# Patient Record
Sex: Female | Born: 1970 | Race: White | Hispanic: No | Marital: Married | Smoking: Former smoker
Health system: Southern US, Community
[De-identification: ages and names within clinical notes are randomized; demographics above are authoritative.]

## PROBLEM LIST (undated history)

## (undated) DIAGNOSIS — C801 Malignant (primary) neoplasm, unspecified: Secondary | ICD-10-CM

## (undated) DIAGNOSIS — G4730 Sleep apnea, unspecified: Secondary | ICD-10-CM

## (undated) DIAGNOSIS — E785 Hyperlipidemia, unspecified: Secondary | ICD-10-CM

## (undated) DIAGNOSIS — K219 Gastro-esophageal reflux disease without esophagitis: Secondary | ICD-10-CM

## (undated) DIAGNOSIS — R7303 Prediabetes: Secondary | ICD-10-CM

## (undated) DIAGNOSIS — G43909 Migraine, unspecified, not intractable, without status migrainosus: Secondary | ICD-10-CM

## (undated) HISTORY — DX: Gastro-esophageal reflux disease without esophagitis: K21.9

## (undated) HISTORY — PX: BREAST EXCISIONAL BIOPSY: SUR124

## (undated) HISTORY — DX: Hyperlipidemia, unspecified: E78.5

## (undated) HISTORY — PX: BREAST SURGERY: SHX581

## (undated) HISTORY — DX: Migraine, unspecified, not intractable, without status migrainosus: G43.909

---

## 1998-04-13 ENCOUNTER — Other Ambulatory Visit: Admission: RE | Admit: 1998-04-13 | Discharge: 1998-04-13 | Payer: Self-pay | Admitting: Obstetrics and Gynecology

## 1998-08-18 HISTORY — PX: TONSILLECTOMY: SUR1361

## 1999-04-16 ENCOUNTER — Ambulatory Visit (HOSPITAL_BASED_OUTPATIENT_CLINIC_OR_DEPARTMENT_OTHER): Admission: RE | Admit: 1999-04-16 | Discharge: 1999-04-16 | Payer: Self-pay | Admitting: Otolaryngology

## 1999-06-27 ENCOUNTER — Other Ambulatory Visit: Admission: RE | Admit: 1999-06-27 | Discharge: 1999-06-27 | Payer: Self-pay | Admitting: Obstetrics and Gynecology

## 2000-05-06 ENCOUNTER — Other Ambulatory Visit: Admission: RE | Admit: 2000-05-06 | Discharge: 2000-05-06 | Payer: Self-pay | Admitting: Obstetrics and Gynecology

## 2001-04-15 ENCOUNTER — Other Ambulatory Visit: Admission: RE | Admit: 2001-04-15 | Discharge: 2001-04-15 | Payer: Self-pay | Admitting: Obstetrics and Gynecology

## 2002-04-26 ENCOUNTER — Other Ambulatory Visit: Admission: RE | Admit: 2002-04-26 | Discharge: 2002-04-26 | Payer: Self-pay | Admitting: Obstetrics and Gynecology

## 2003-03-23 ENCOUNTER — Ambulatory Visit (HOSPITAL_BASED_OUTPATIENT_CLINIC_OR_DEPARTMENT_OTHER): Admission: RE | Admit: 2003-03-23 | Discharge: 2003-03-23 | Payer: Self-pay | Admitting: Family Medicine

## 2003-03-24 ENCOUNTER — Ambulatory Visit (HOSPITAL_COMMUNITY): Admission: RE | Admit: 2003-03-24 | Discharge: 2003-03-24 | Payer: Self-pay | Admitting: Family Medicine

## 2003-03-24 ENCOUNTER — Encounter: Payer: Self-pay | Admitting: Family Medicine

## 2003-05-23 ENCOUNTER — Other Ambulatory Visit: Admission: RE | Admit: 2003-05-23 | Discharge: 2003-05-23 | Payer: Self-pay | Admitting: Obstetrics and Gynecology

## 2003-07-25 ENCOUNTER — Ambulatory Visit (HOSPITAL_BASED_OUTPATIENT_CLINIC_OR_DEPARTMENT_OTHER): Admission: RE | Admit: 2003-07-25 | Discharge: 2003-07-25 | Payer: Self-pay | Admitting: Internal Medicine

## 2003-09-11 ENCOUNTER — Ambulatory Visit (HOSPITAL_COMMUNITY): Admission: RE | Admit: 2003-09-11 | Discharge: 2003-09-11 | Payer: Self-pay | Admitting: Family Medicine

## 2004-06-19 ENCOUNTER — Other Ambulatory Visit: Admission: RE | Admit: 2004-06-19 | Discharge: 2004-06-19 | Payer: Self-pay | Admitting: Obstetrics and Gynecology

## 2004-08-18 HISTORY — PX: CHOLECYSTECTOMY: SHX55

## 2004-09-02 ENCOUNTER — Ambulatory Visit: Payer: Self-pay | Admitting: Internal Medicine

## 2004-10-11 ENCOUNTER — Ambulatory Visit: Payer: Self-pay | Admitting: Internal Medicine

## 2004-11-19 ENCOUNTER — Ambulatory Visit (HOSPITAL_BASED_OUTPATIENT_CLINIC_OR_DEPARTMENT_OTHER): Admission: RE | Admit: 2004-11-19 | Discharge: 2004-11-19 | Payer: Self-pay | Admitting: Internal Medicine

## 2004-11-24 ENCOUNTER — Ambulatory Visit: Payer: Self-pay | Admitting: Internal Medicine

## 2004-12-05 ENCOUNTER — Ambulatory Visit: Payer: Self-pay | Admitting: Internal Medicine

## 2004-12-19 ENCOUNTER — Ambulatory Visit (HOSPITAL_COMMUNITY): Admission: RE | Admit: 2004-12-19 | Discharge: 2004-12-19 | Payer: Self-pay | Admitting: Family Medicine

## 2004-12-19 IMAGING — CT CT PELVIS W/ CM
1 of 3 series · 13 of 32 positions shown, 19 images · IV contrast (CONTRAST)
Comparison: none

HISTORY: Right lower quadrant pain

[Series 8447: — · axial · 0.62mm/px · z∈[+1251,+1621]mm · 13 of 88 slices shown, 19 images]
[im 7/88  soft-tissue]
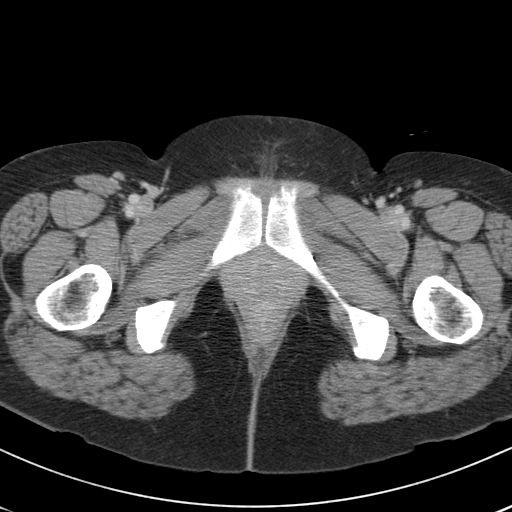
[im 7/88  bone]
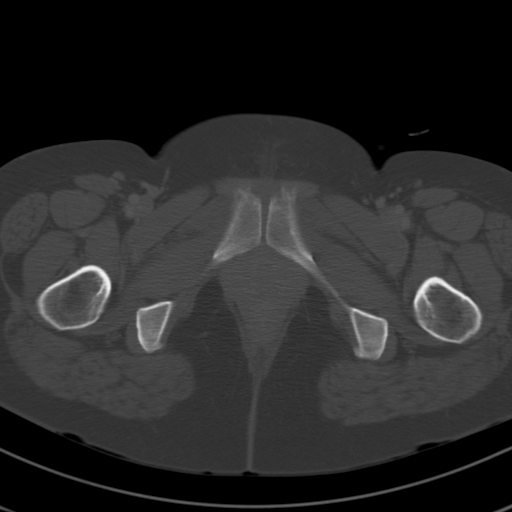
[im 13/88  soft-tissue]
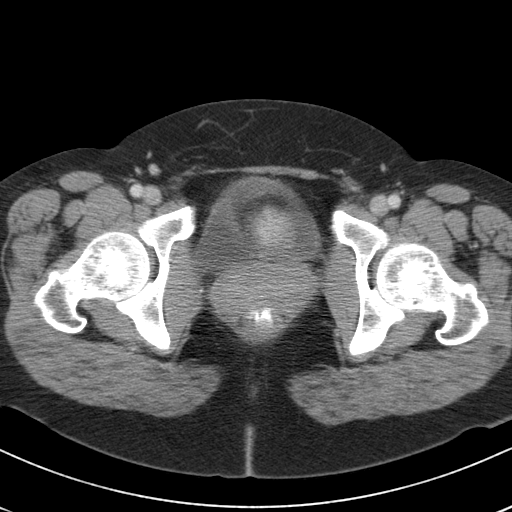
[im 19/88  soft-tissue]
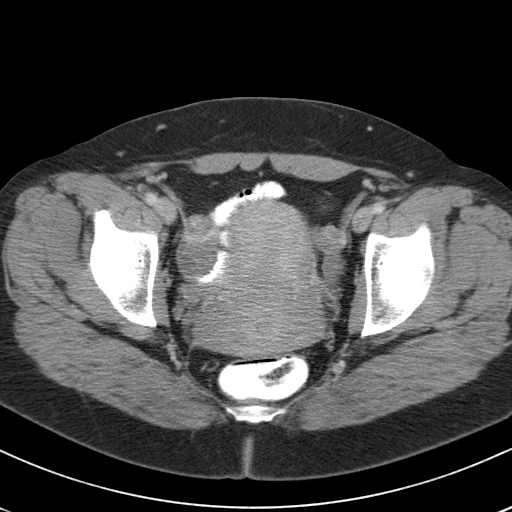
[im 25/88  soft-tissue]
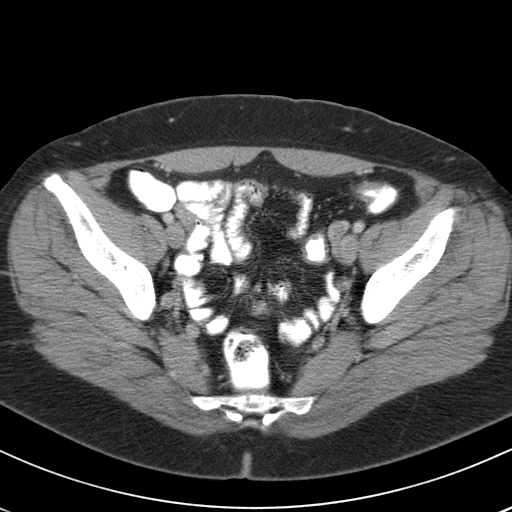
[im 32/88  soft-tissue]
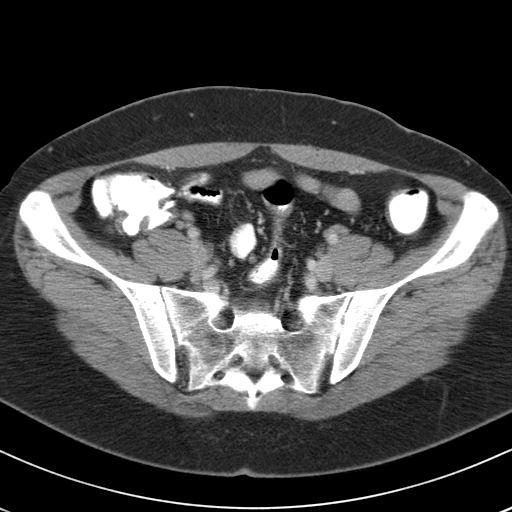
[im 38/88  soft-tissue]
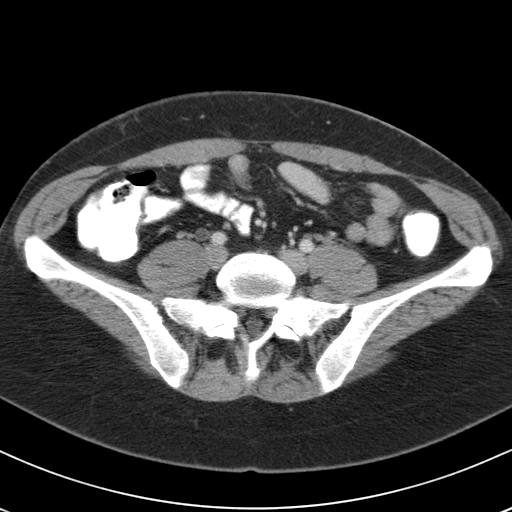
[im 44/88  soft-tissue]
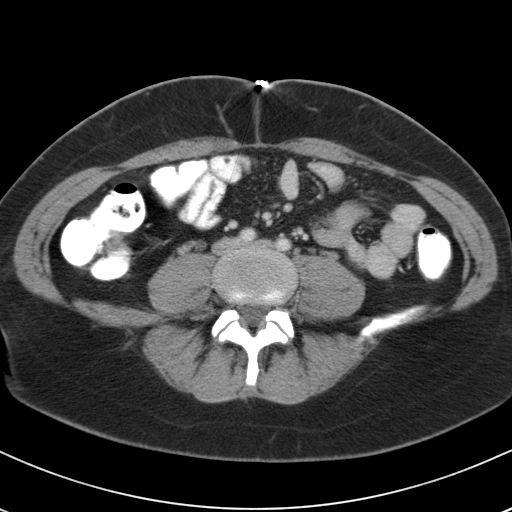
[im 50/88  soft-tissue]
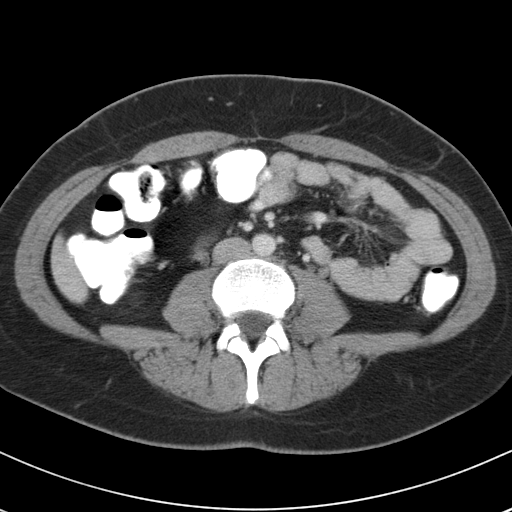
[im 56/88  soft-tissue]
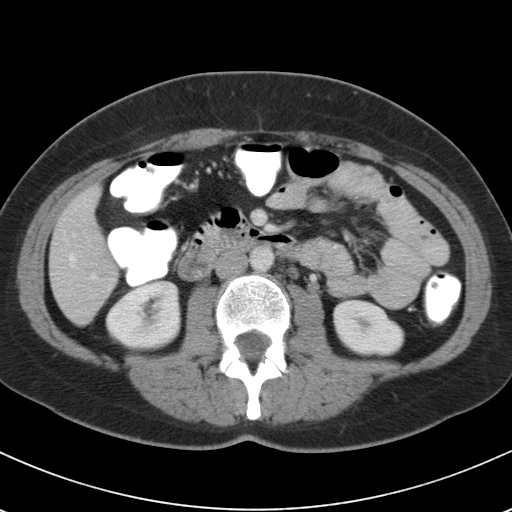
[im 56/88  bone]
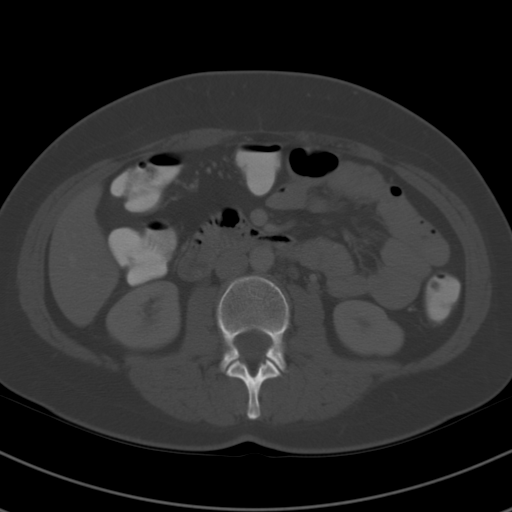
[im 63/88  soft-tissue]
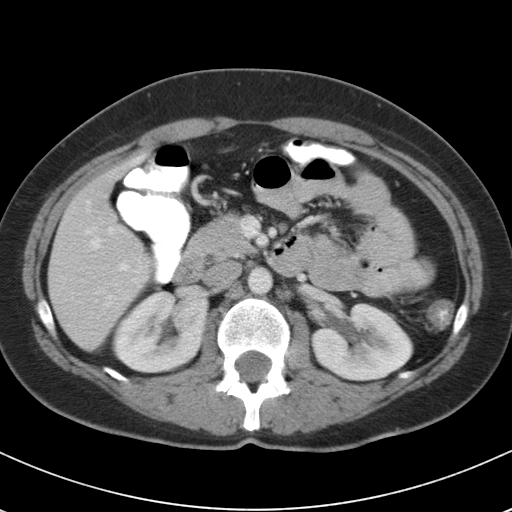
[im 63/88  lung]
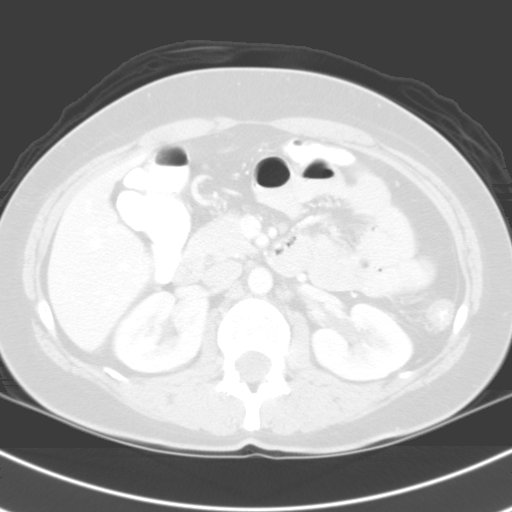
[im 69/88  soft-tissue]
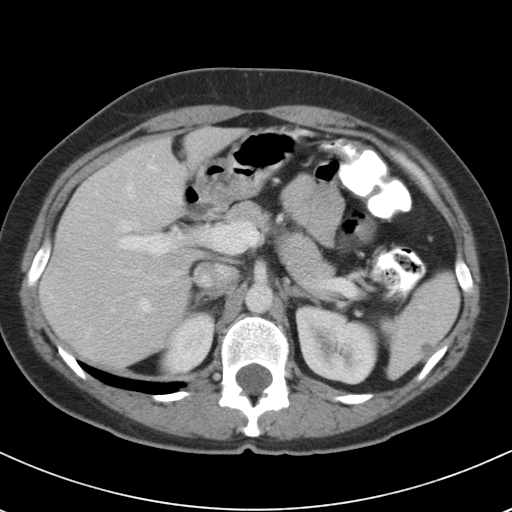
[im 69/88  lung]
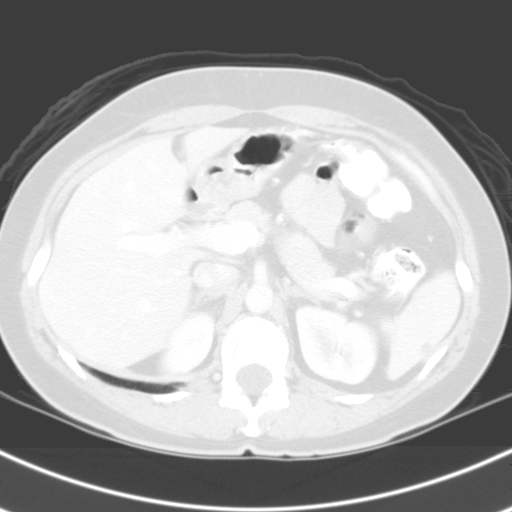
[im 75/88  soft-tissue]
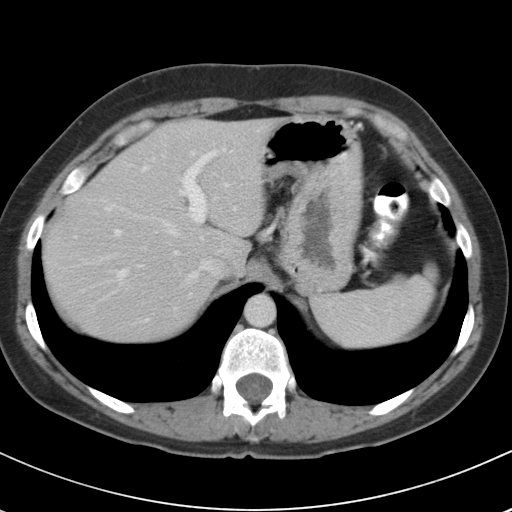
[im 75/88  lung]
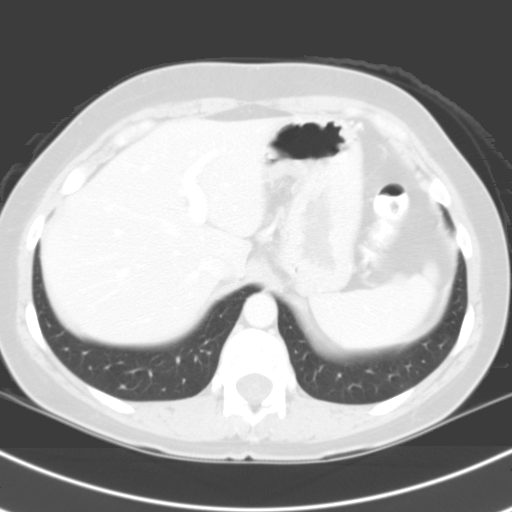
[im 81/88  soft-tissue]
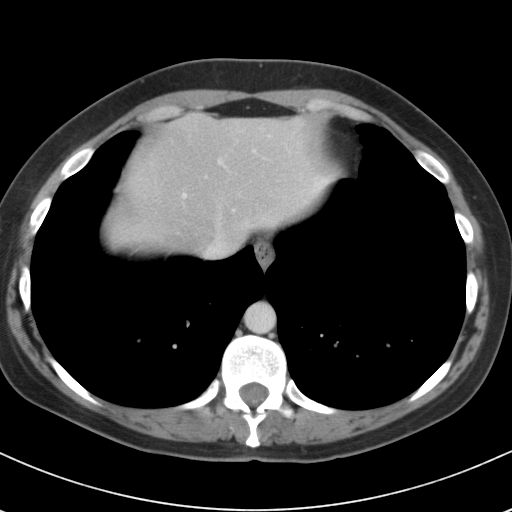
[im 81/88  lung]
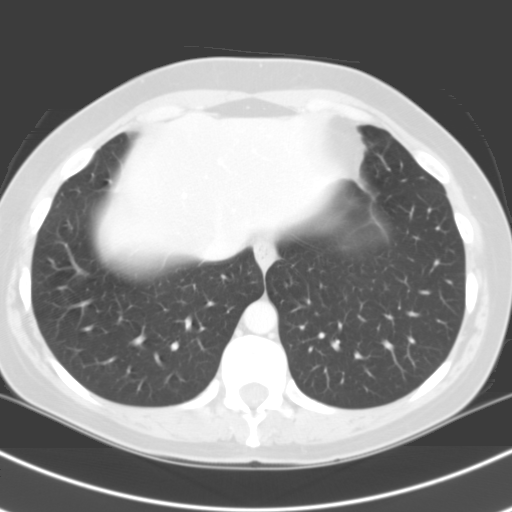

[13 of 32 positions shown; findings below may reference images not displayed]

CT ABDOMEN AND PELVIS WITH CONTRAST:

Multidetector helical CT imaging abdomen and pelvis performed.
Exam utilized dilute oral contrast and 100 cc [8W].
No prior studies comparison.

Lung bases clear.
Status post cholecystectomy.
Liver, pancreas, kidneys, and adrenal glands normal appearance.
Small nonspecific low attenuation focus spleen, 7 x 6 mm diameter, image 20.
Small splenule.
No upper abdominal mass, adenopathy, free fluid, or inflammatory process.
A few scattered normal sized left periaortic lymph nodes noted.
IMPRESSION: 7 x 6 mm diameter nonspecific low attenuation focus spleen, which appears to
slightly fill in with of contrast on delayed images, potentially representing a
tiny splenic hemangioma.
No other upper abdominal abnormalities.

CT PELVIS:

No ureteral calcification or dilatation.
Few scattered bilateral pelvic phleboliths.
Normal appendix.
No pelvic mass, adenopathy, or free fluid.
Small bowel loops unremarkable.
Minimally prominent sigmoid wall, which may be related to underdistention by
contrast.
Normal appearance of uterus and ovaries by CT.
No hernia or acute process identified.
IMPRESSION: Normal appendix.
No definite acute pelvic abnormalities.

## 2005-01-02 ENCOUNTER — Ambulatory Visit: Payer: Self-pay | Admitting: Internal Medicine

## 2005-03-07 ENCOUNTER — Encounter (INDEPENDENT_AMBULATORY_CARE_PROVIDER_SITE_OTHER): Payer: Self-pay | Admitting: *Deleted

## 2005-03-07 ENCOUNTER — Ambulatory Visit (HOSPITAL_COMMUNITY): Admission: RE | Admit: 2005-03-07 | Discharge: 2005-03-07 | Payer: Self-pay | Admitting: Obstetrics and Gynecology

## 2005-04-03 ENCOUNTER — Ambulatory Visit: Payer: Self-pay | Admitting: Internal Medicine

## 2005-07-04 ENCOUNTER — Other Ambulatory Visit: Admission: RE | Admit: 2005-07-04 | Discharge: 2005-07-04 | Payer: Self-pay | Admitting: Obstetrics and Gynecology

## 2005-08-18 HISTORY — PX: ENDOMETRIAL ABLATION: SHX621

## 2005-10-03 ENCOUNTER — Ambulatory Visit: Payer: Self-pay | Admitting: Internal Medicine

## 2006-04-03 ENCOUNTER — Ambulatory Visit: Payer: Self-pay | Admitting: Internal Medicine

## 2006-06-21 ENCOUNTER — Emergency Department (HOSPITAL_COMMUNITY): Admission: EM | Admit: 2006-06-21 | Discharge: 2006-06-22 | Payer: Self-pay | Admitting: *Deleted

## 2006-06-21 IMAGING — CT CT ABDOMEN W/ CM
3 of 5 series · 14 of 32 positions shown, 19 images · IV contrast (ORAL OMNI 350 & 100 ML OMNI 300)
Comparison: [DATE]

CLINICAL DATA: Right-sided abdominal pain.  
 ABDOMEN CT WITH CONTRAST:
TECHNIQUE: Multidetector CT imaging of the abdomen was performed following the standard protocol during bolus administration of intravenous contrast.
 Contrast:  100 cc Omnipaque 300
TECHNIQUE: Multidetector CT imaging of the pelvis was performed following the standard protocol during bolus administration of intravenous contrast.

[Series 2: routine abdomen · axial · 0.70mm/px · z∈[-396,-152]mm · 4 of 83 slices shown, 9 images]
[im 17/83  soft-tissue]
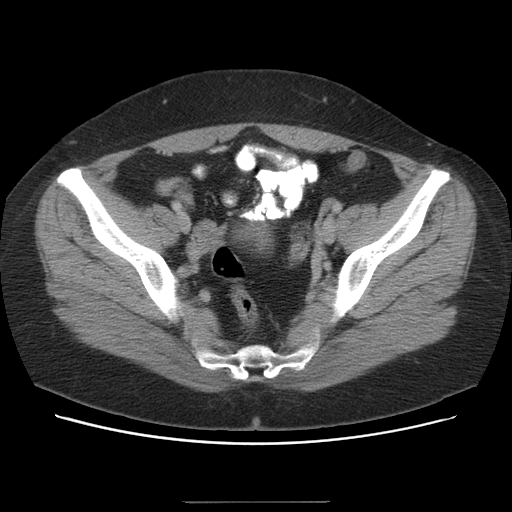
[im 17/83  lung]
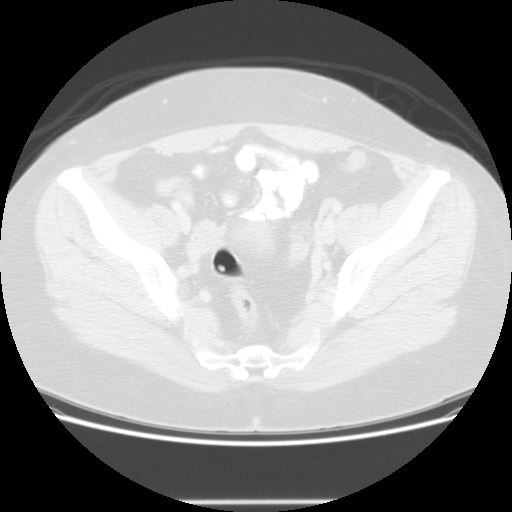
[im 17/83  bone]
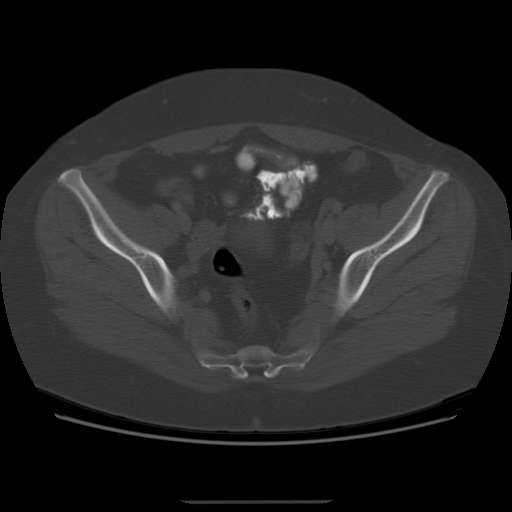
[im 33/83  soft-tissue]
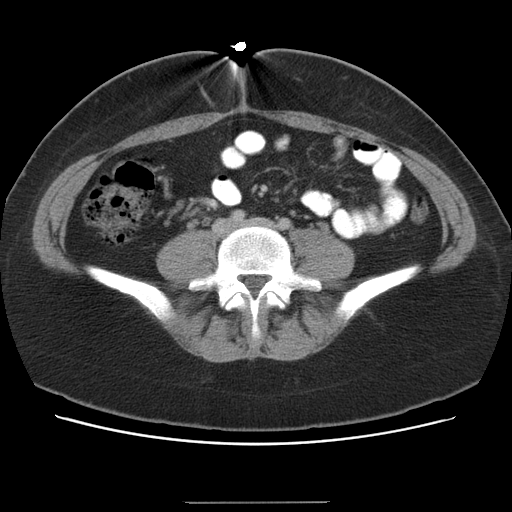
[im 33/83  lung]
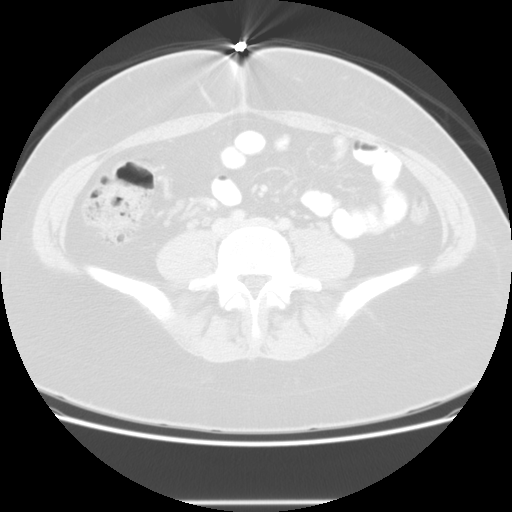
[im 50/83  soft-tissue]
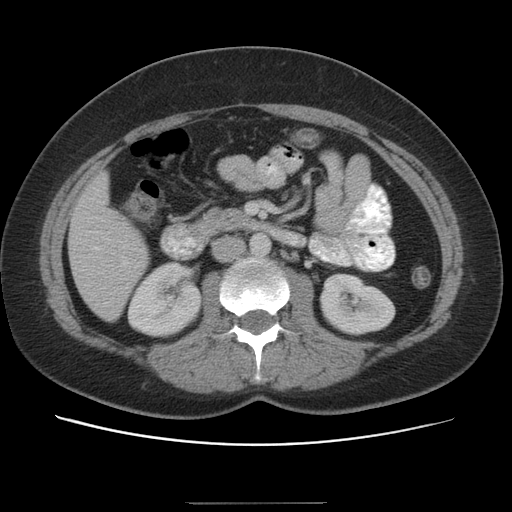
[im 50/83  lung]
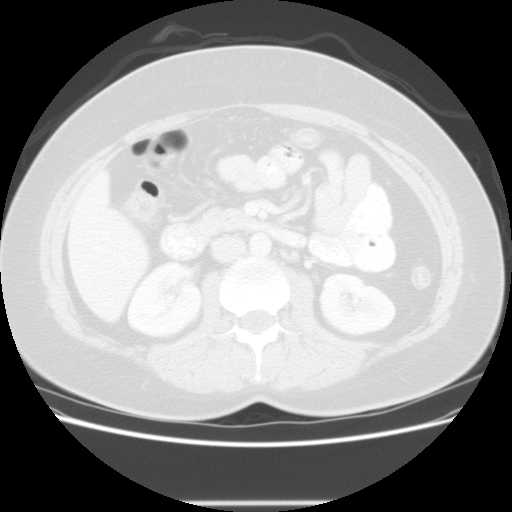
[im 66/83  soft-tissue]
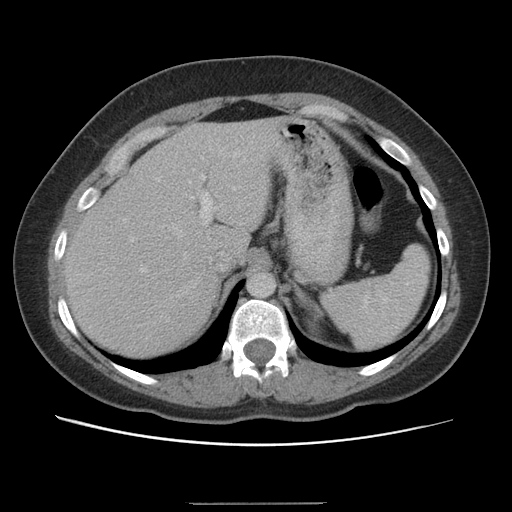
[im 66/83  lung]
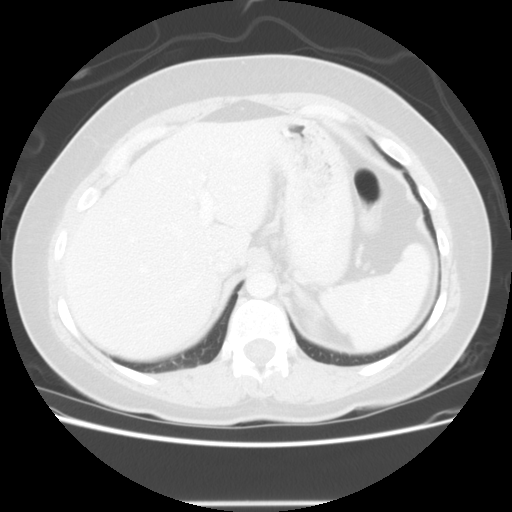

[Series 400: reformatted · sagittal · 0.93mm/px · 8 of 163 slices shown (1 of 2)]
[im 15/163  soft-tissue]
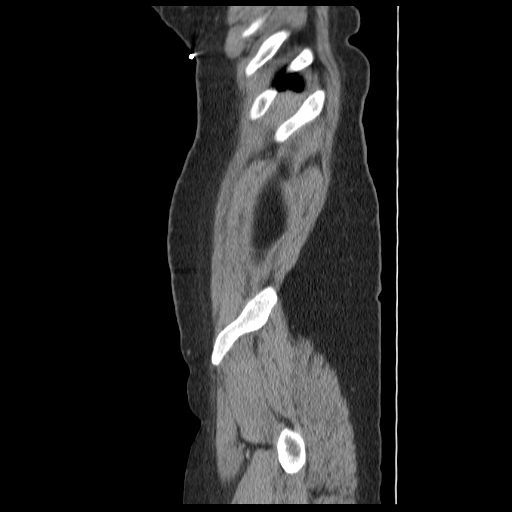
[im 30/163  soft-tissue]
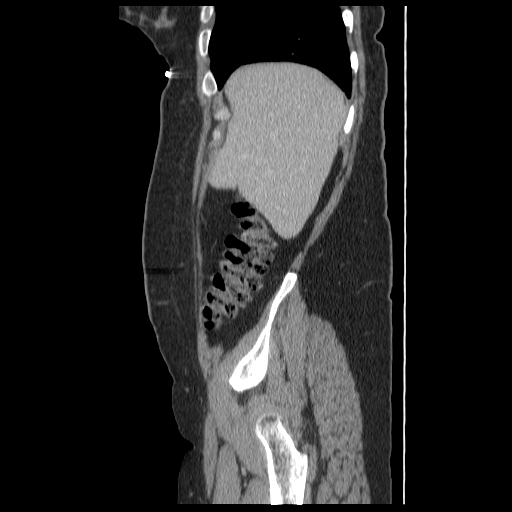
[im 59/163  soft-tissue]
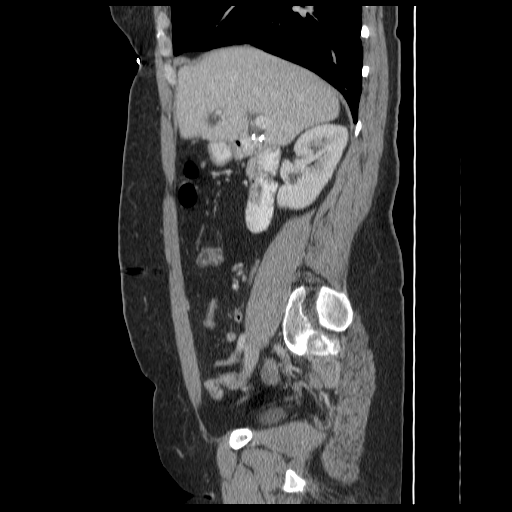
[im 74/163  soft-tissue]
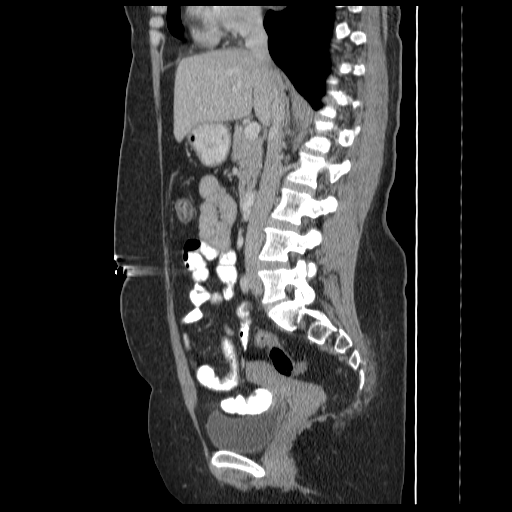
[im 89/163  soft-tissue]
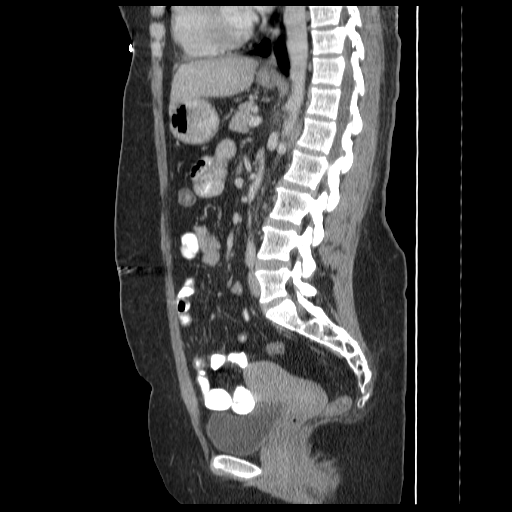
[im 104/163  soft-tissue]
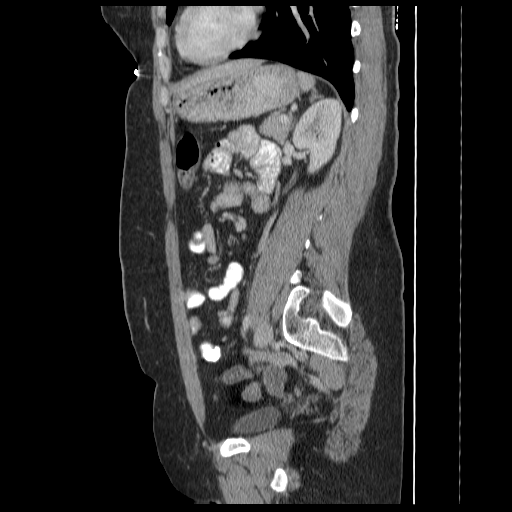
[im 133/163  soft-tissue]
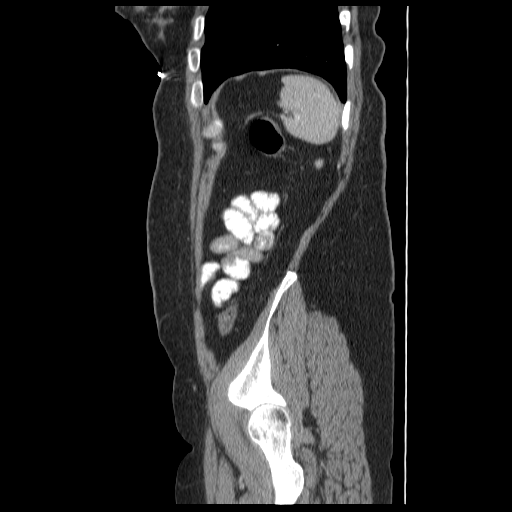
[im 148/163  soft-tissue]
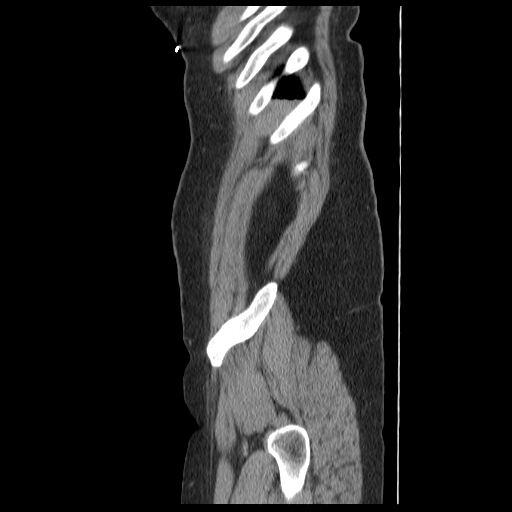

[Series 401: reformatted · coronal · 0.93mm/px · 2 of 143 slices shown (2 of 2)]
[im 16/143  soft-tissue]
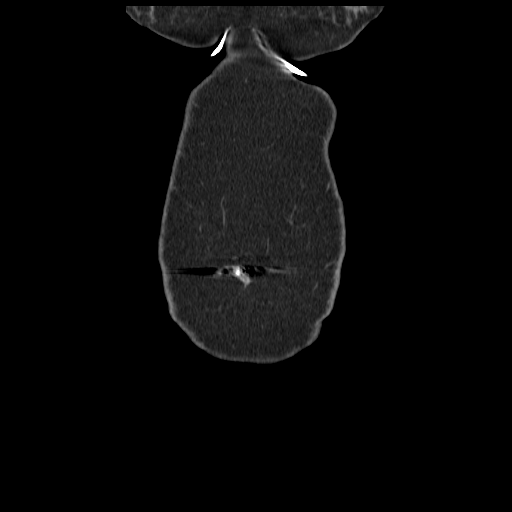
[im 32/143  soft-tissue]
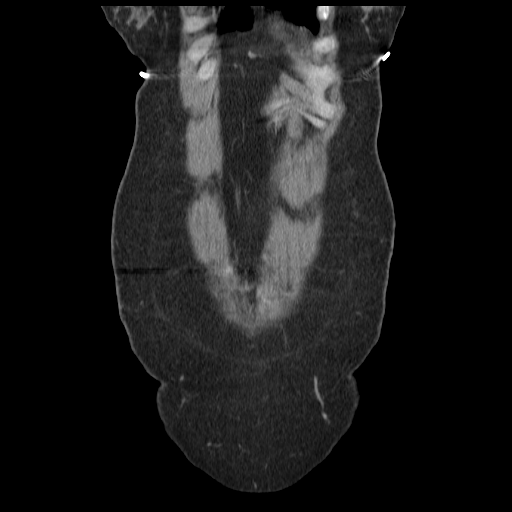

[14 of 32 positions shown; findings below may reference images not displayed]

FINDINGS: Post-cholecystectomy is noted.  Hypodensities in the spleen are stable.  The kidneys, adrenal glands, pancreas and liver are within normal limites.  A 2 mm right lower lobe nodule is present on image #5.  In this age group, this is statistically benign.
IMPRESSION: No acute intraabdominal pathology. 
 PELVIS CT WITH CONTRAST:
FINDINGS: The appendix is within normal limits.  The uterus and adnexa are within normal limits.  Negative free fluid or abnormal adenopathy.  The bladder is within normal limits.
IMPRESSION: No acute intrapelvic pathology.

## 2007-04-08 ENCOUNTER — Ambulatory Visit: Payer: Self-pay | Admitting: Internal Medicine

## 2010-12-31 NOTE — Assessment & Plan Note (Signed)
[facility]                             PULMONARY OFFICE NOTE   JALYNNE, PERSICO                     MRN:          [id number]  DATE:04/08/2007                            DOB:          06/23/1971    PROBLEM LIST:  1. Hypersomnia with narcolepsy.  2. Obstructive sleep apnea.   HISTORY:  For a few weeks she found herself repeatedly waking at night,  so she tried off of her CPAP and has not restarted it yet.  Less  frequent use of Ritalin which she uses only if she does not get 8 hours  of sleep, no naps, day-shift job.  She does use two alarms to get  herself up an hour early to take her Ritalin dose.  She then returns to  sleep while that kicks in and her second alarm wakes her an hour later.  Ritalin remains very well tolerated.  She says thyroid function has been  checked and found normal.  We reviewed her original sleep study which  had shown an AHI of only 10 per hour.  She insists that she does  recognize she feels better if she will use her CPAP.  She has just  really gotten a little passive about it.  I tried not to over state the  impact of CPAP on her health given an AHI of only 10.  I think it has to  be worth it to her from a symptoms standpoint.  Overall, she seems  pretty stable.   MEDICATIONS:  1. Ritalin 5 mg, from 1-3 t.i.d. p.r.n.  2. CPAP was at 10-CWP.   OBJECTIVE:  VITAL SIGNS:  Weight 172 pounds, BP 118/74, pulse 91, room  air saturation 98%.  HEART:  Sounds are regular without murmur.  GENERAL:  She seems very calm.  No tremor.  NECK:  I do not feel thyroid.  LUNGS:  Breathing was unlabored.   IMPRESSION:  1. Previous sleep studies have best fit a pattern of narcolepsy with      an AHI of 6.7 to 10 and a mean sleep latency of 3.2 when that was      tested in 2004.  2. Ongoing tobacco use.   PLAN:  1. She will use CPAP to the extent that she finds it symptomatically      helpful.  2. Continue Ritalin.  3. Make  an effort at smoking cessation.  4. Schedule return 1 year followup, earlier p.r.n.  5. Maintain emphasis on good sleep hygiene.     Clinton D. Maple Hudson, MD, Tonny Bollman, FACP  Electronically Signed    CDY/MedQ  DD: 04/11/2007  DT: 04/11/2007  Job #: [id number]   cc:   Lorin Picket A. Gerda Diss, MD

## 2011-01-03 NOTE — Procedures (Signed)
NAME:  Diana Tate, Diana Tate              ACCOUNT NO.:  [id number]   MEDICAL RECORD NO.:  [id number]          PATIENT TYPE:  OUT   LOCATION:  SLEEP CENTER                 FACILITY:  [facility]   PHYSICIAN:  Clinton D. Maple Hudson, M.D. DATE OF BIRTH:  11-06-1970   DATE OF STUDY:  11/19/2004                              NOCTURNAL POLYSOMNOGRAM   STUDY DATE:  November 19, 2004   REFERRING PHYSICIAN:  Dr. Jetty Duhamel   INDICATION FOR STUDY:  Hypersomnia with sleep apnea.  Epworth Sleepiness  Score 15/24, BMI 26, weight 160 pounds.  Daytime medication includes  methylphenidate.   SLEEP ARCHITECTURE:  Total sleep time 363 minutes with sleep efficiency 98%.  Stage I was 1%, stage II 43%, stages III and IV 24% and REM was 32% of total  sleep time.  Sleep latency 3 minutes, REM latency 98 minutes, awake after  sleep onset 4 minutes, arousal index 10.7.  No sleep medications were taken.   RESPIRATORY DATA:  Respiratory disturbance index (RDI/AHI) 9.2 obstructive  events per hour indicating mild obstructive sleep apnea/hypopnea syndrome.  This included 2 central apneas, 44 obstructive apneas and 16 hypopneas.  Most events were recorded while sleeping supine.  REM RDI 23.  Criteria for  split-study protocol were not met.   OXYGEN DATA:  Moderate snoring with oxygen desaturation to a nadir of 74%  noted stage II while sleeping on the right side.  Mean oxygen saturation  through the study was 94% on room air.   CARDIAC DATA:  Normal sinus rhythm.   MOVEMENT/PARASOMNIA:  A total of 53 limb jerks were recorded of which 9 were  associated with arousal or awakening for periodic limb movement with arousal  index of 1.5 per hour which is probably not significant.   IMPRESSION/RECOMMENDATION:  1.  Normal sleep architecture except for unusual amounts of stages III, IV      and rapid eye movement in this age group.  2.  Mild obstructive sleep apnea/hypopnea syndrome, respiratory disturbance      index 10.2 per hour  without oxygen desaturation to 74% with mild to      moderate snoring.      CDY/MEDQ  D:  11/24/2004 11:08:53  T:  11/24/2004 13:33:02  Job:  [id number]

## 2011-01-03 NOTE — H&P (Signed)
NAME:  Diana Tate, Diana Tate              ACCOUNT NO.:  [id number]   MEDICAL RECORD NO.:  [id number]          PATIENT TYPE:  AMB   LOCATION:                                FACILITY:  [facility]   PHYSICIAN:  Juluis Mire, M.D.        DATE OF BIRTH:   DATE OF ADMISSION:  03/07/2005  DATE OF DISCHARGE:  03/07/2005                                HISTORY & PHYSICAL   A 40 year old gravida 2, para 2 married female presents for hysteroscopy  with NovaSure ablation.   In relation to present admission patient has cycles lasting six to seven  days.  She has three to four days of heavy flow changing tampons every hour  with clots and increasing back discomfort.  Saline infusion ultrasound was  unremarkable.  Evidence consistent of adenomyosis.  Options were discussed.  Patient now presents for hysteroscopy with NovaSure ablation.   ALLERGIES:  No known drug allergies.   MEDICATIONS:  Methylphenidate.   PAST MEDICAL HISTORY:  Usual childhood diseases without any significant  sequelae.   PAST OBSTETRICAL HISTORY:  Two vaginal deliveries.  She has also had a  previous cholecystectomy.   FAMILY HISTORY:  Noncontributory.   SOCIAL HISTORY:  No tobacco or alcohol use.   REVIEW OF SYSTEMS:  Noncontributory.   PHYSICAL EXAMINATION:  GENERAL:  Patient is afebrile with stable vital  signs.  HEENT:  Patient normocephalic.  Pupils are equal, round, and reactive to  light and accommodation.  Extraocular movements were intact.  Sclerae and  conjunctivae clear.  Oropharynx clear.  NECK:  Without thyromegaly.  BREASTS:  No discrete masses.  LUNGS:  Clear.  CARDIOVASCULAR:  Regular rhythm and rate with no murmurs or gallops.  ABDOMEN:  Benign.  No mass, organomegaly, or tenderness.  PELVIC:  Normal external genitalia.  Vaginal mucosa clear.  Cervix  unremarkable.  Uterus upper limits of normal size, mildly boggy.  Adnexal  unremarkable.  EXTREMITIES:  Trace edema.  NEUROLOGIC:  Grossly within normal  limits.   IMPRESSION:  Menorrhagia secondary to adenomyosis.   PLAN:  The patient will undergo hysteroscopy with NovaSure ablation.  Success rates of 80% are quoted.  Risks of surgery are explained including  the risk of infection and the risk of hemorrhage that could require  transfusion or possible hysterectomy, risk of injury to adjacent organs such  as bowel requiring further exploratory surgery, risk of  deep venous thrombosis and pulmonary embolus.  Alternatives have been  discussed including birth control pills, Mirena IUD, or hysterectomy.  The  patient is in favor of the ablative technique.  Of note, patient has had a  prior vasectomy.  We have discussed that ablation does preclude future  pregnancies.       JSM/MEDQ  D:  03/07/2005  T:  03/07/2005  Job:  [id number]

## 2011-01-03 NOTE — Op Note (Signed)
Diana Tate, Diana Tate              ACCOUNT NO.:  [id number]   MEDICAL RECORD NO.:  [id number]          PATIENT TYPE:  AMB   LOCATION:  [facility]                           FACILITY:  [facility]   PHYSICIAN:  Juluis Mire, M.D.   DATE OF BIRTH:  Nov 14, 1970   DATE OF PROCEDURE:  03/07/2005  DATE OF DISCHARGE:                                 OPERATIVE REPORT   PREOPERATIVE DIAGNOSIS:  Menorrhagia.   POSTOPERATIVE DIAGNOSIS:  Menorrhagia.   OPERATIVE PROCEDURES:  1.  Hysteroscopy.  2.  Endometrial curettings.  3.  NovaSure ablation.   SURGEON:  Juluis Mire, M.D.   ANESTHESIA:  General.   ESTIMATED BLOOD LOSS:  Minimal.   PACKS AND DRAINS:  None.   INDICATIONS:  Dictated in the history and physical.   PROCEDURE:  The patient was taken to the OR and placed in the supine  position.  After a satisfactory level of general anesthesia obtained, the  patient was placed in the dorsal lithotomy position in the [place] stirrups.  The perineum and vagina were prepped out with Betadine and draped in a  sterile field.  A speculum was placed in the vaginal vault.  The cervix was  grasped with a single-tooth tenaculum, the uterus sounded to 9 cm.  Endocervical length was 5 cm.  The cervix was serially dilated to a size 25  Pratt dilator.  The nonoperative hysteroscope was introduced and the  intrauterine cavity was distended using lactated Ringer's.  Visualization  revealed a normal endometrial cavity with no polyps or excrescences.  Endometrial curettings were obtained.  At this point in time, the NovaSure  was brought into place, inserted and expanded appropriately.  Cavity length  was 4 cm.  We manipulated the NovaSure.  The total cavity width was 3.5 cm.  We passed the CO2 test.  At this point in time, the ablation was undertaken  at a power of 77 for 1 minute 37 seconds.  The NovaSure was then removed  intact and the tenaculum and speculum were then removed.  The patient was  taken out of the  dorsal lithotomy position, once alert and extubated  transferred to the recovery room in good condition.  Sponge and needle count  were reported as correct by the circulating nurse.     JSM/MEDQ  D:  03/07/2005  T:  03/07/2005  Job:  [id number]

## 2011-01-03 NOTE — Assessment & Plan Note (Signed)
[facility]                               PULMONARY OFFICE NOTE   Diana Tate, Diana Tate                     MRN:          [id number]  DATE:04/03/2006                            DOB:          04/19/1971    PROBLEM LIST:  1. Hypersomnia with narcolepsy.  2. Obstructive sleep apnea.   HISTORY:  She returns for six month followup having been overall very  stable.  She stops wearing CPAP any time she gets a cold or head congestion  and then says it takes her time to readjust again so she only ends up  wearing it for a month or so at a time and admits she would do better if she  could tolerate a full face mask.  We know she has an obstructive apnea  component but it was quite mild and I was never really sure that it was  medically significant.  Her dominant problem has been the severe hypersomnia  with a mean sleep latency of 3.2 minutes when tested in December of 2004.  She has had some clues that this might be narcolepsy rather than etiopathic  hypersomnia but there was also a component of inadequate sleep hygiene.  We  continue to address those issues.  She tried without Ritalin for a couple of  weeks and says it was hard.  She had been concerned that Ritalin was  causing excessive sweating.  A dermatologist gave her Robinul for that  problem and she has decided there was no relationship to the Ritalin.  She  asks about maintenance Chantix saying that she is fighting hard not to  restart smoking.  We had turned CPAP down from 11 to 9 in February and she  asks that it go back up to 10, which I discussed again.   MEDICATIONS:  Ritalin 5 mg 1-3 t.i.d. p.r.n. supplementing Ritalin 20 mg  sustained release 1 daily. The issues of tolerance, dependence and side  effects have been discussed once again for this controlled drug.   OBJECTIVE:  VITAL SIGNS:  Weight 177 pounds.  Blood pressure 112/60.  Pulse  regular, 83, room air-saturation 97%.  GENERAL:   She looks alert, tanned and comfortable with no tremor.  Pulse is  regular.  Speech and thought process are appropriate.  There are no pressure  marks on her face from the mask and no evident nasal congestion.   IMPRESSION:  1. Mild to minimal obstructive sleep apnea 6.4 per hour.  We continue CPAP      because she wants to.  2. Excessive daytime somnolence, etiopathic versus narcolepsy.  3. History of tobacco dependence.   PLAN:  1. Sleep hygiene and smoking cessation were again discussed.  2. Replacement CPAP mask as needed for comfort.  3. Increase CPAP pressure back from 9 to 10 CWP ([facility]  4. Ritalin was refilled.  5. Refill Chantix, maintenance use as discussed.  6. Schedule return in one year, earlier as needed.  Clinton D. Maple Hudson, MD, FCCP, FACP   CDY/MedQ  DD:  04/04/2006  DT:  04/04/2006  Job #:  [id number]   cc:   Lorin Picket A. Gerda Diss, MD

## 2012-02-17 ENCOUNTER — Other Ambulatory Visit: Payer: Self-pay | Admitting: Obstetrics and Gynecology

## 2012-02-17 DIAGNOSIS — R928 Other abnormal and inconclusive findings on diagnostic imaging of breast: Secondary | ICD-10-CM

## 2012-02-25 ENCOUNTER — Other Ambulatory Visit: Payer: Self-pay | Admitting: Obstetrics and Gynecology

## 2012-02-25 ENCOUNTER — Ambulatory Visit
Admission: RE | Admit: 2012-02-25 | Discharge: 2012-02-25 | Disposition: A | Discharge status: Home / Self Care | Payer: BC Managed Care – PPO | Source: Ambulatory Visit | Attending: Obstetrics and Gynecology | Admitting: Obstetrics and Gynecology

## 2012-02-25 DIAGNOSIS — R928 Other abnormal and inconclusive findings on diagnostic imaging of breast: Secondary | ICD-10-CM

## 2012-02-25 IMAGING — MG MM DIGITAL DIAGNOSTIC UNILAT*L*
3 series · 3 of 3 positions shown · non-contrast
Comparison: [DATE] and [DATE].

CLINICAL DATA: Recall from screening mammography.

DIGITAL DIAGNOSTIC LEFT BREAST MAMMOGRAM

[L CC (1 of 2)]
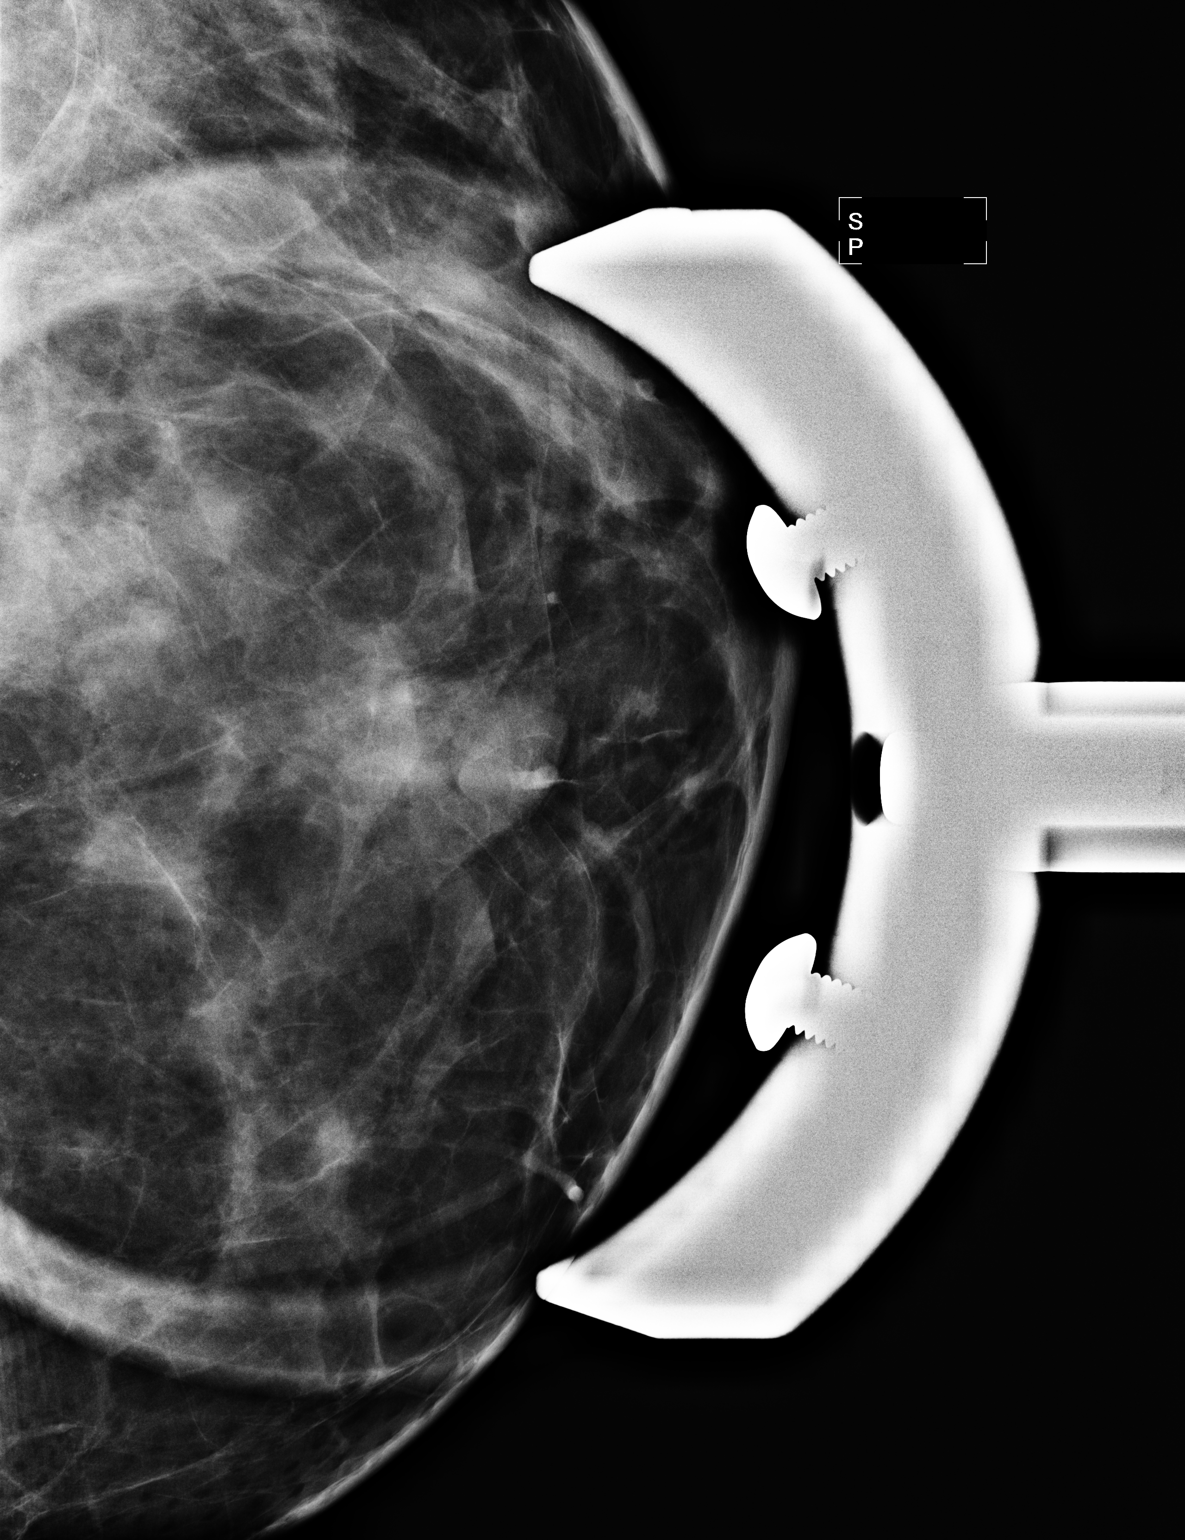

[L ML]
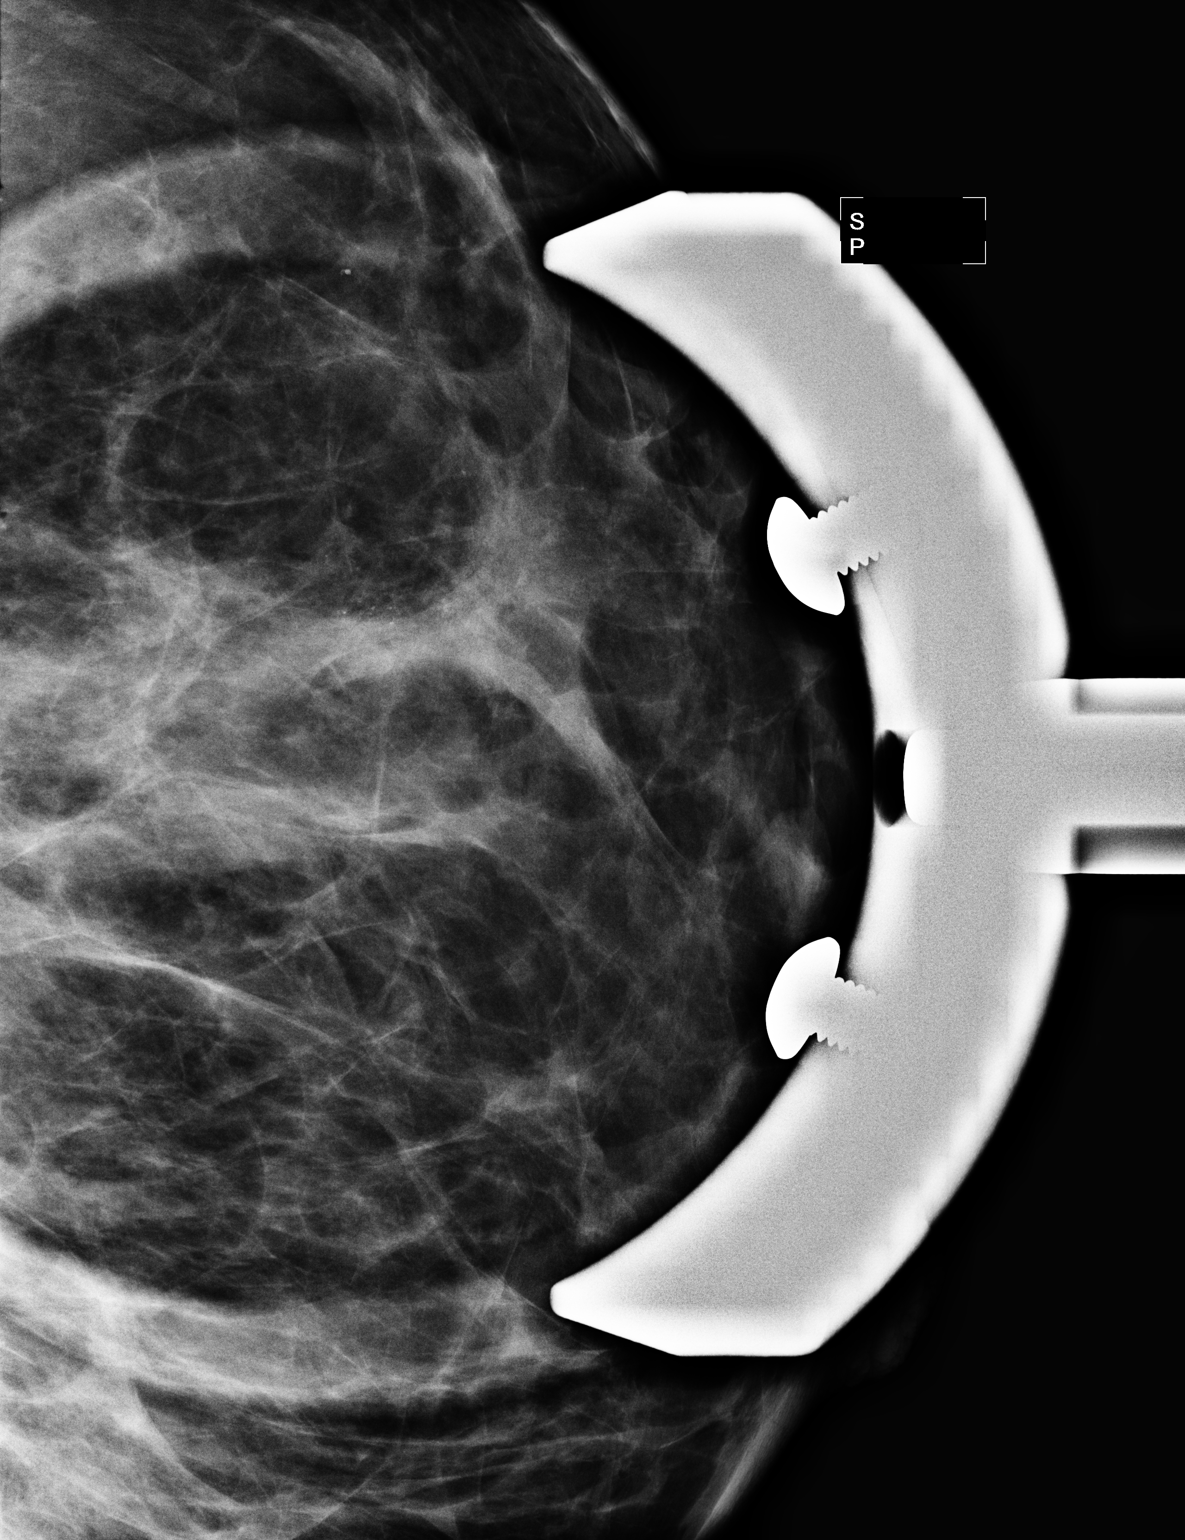

[L CC (2 of 2)]
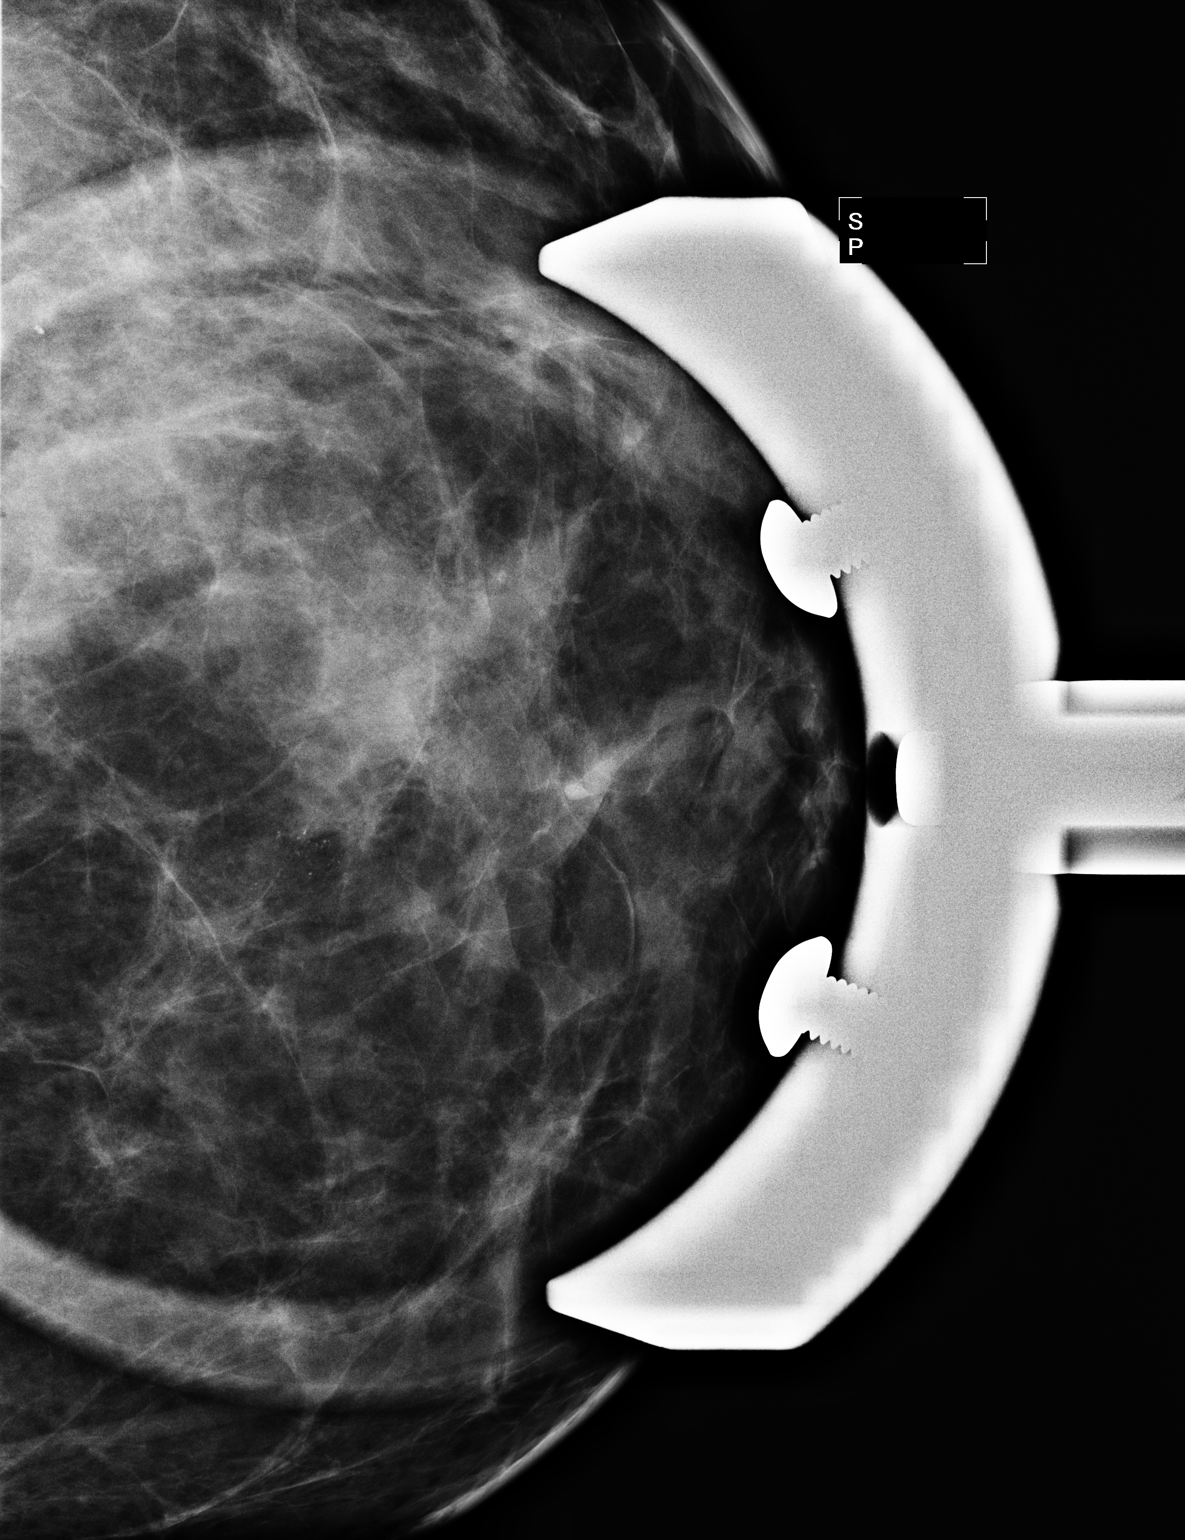

[3 of 3 positions shown; findings below may reference images not displayed]

FINDINGS: Magnification views demonstrate a small (12 mm) cluster
of pleomorphic microcalcifications located within the left breast
at approximately the 12 o'clock position.  These are slightly
suspicious for DCIS and tissue sampling is recommended.  I have
discussed stereotactic core biopsy of the calcifications with the
patient and this will be scheduled per patient preference.
IMPRESSION: Small cluster of pleomorphic microcalcifications located in the
left breast at the 12 o'clock position.  Tissue sampling is
recommended and stereotactic core biopsy will be scheduled.

RECOMMENDATION:
Stereotactic core biopsy.

BI-RADS CATEGORY 4:  Suspicious abnormality - biopsy should be
considered.

## 2012-03-12 ENCOUNTER — Ambulatory Visit
Admission: RE | Admit: 2012-03-12 | Discharge: 2012-03-12 | Disposition: A | Discharge status: Home / Self Care | Payer: BC Managed Care – PPO | Source: Ambulatory Visit | Attending: Obstetrics and Gynecology | Admitting: Obstetrics and Gynecology

## 2012-03-12 ENCOUNTER — Other Ambulatory Visit: Payer: Self-pay | Admitting: Obstetrics and Gynecology

## 2012-03-12 DIAGNOSIS — R928 Other abnormal and inconclusive findings on diagnostic imaging of breast: Secondary | ICD-10-CM

## 2012-03-12 IMAGING — MG MM BREAST STEREO BIOPSY*L*
2 series · 2 of 2 positions shown · non-contrast
Comparison: Previous exams

***ADDENDUM*** CREATED: [DATE] [DATE]

Atypical ductal hyperplasia was reported histologically.  This
corresponds well with the imaging findings.  The patient was
contacted by telephone, on [DATE].  She states the wound site
is clean and dry with no hematoma.  Surgical consultation has been
scheduled with Dr. FORTUNATE on [DATE].
***END ADDENDUM*** SIGNED BY: FORTUNATE, M.D.
CLINICAL DATA: Left breast calcifications
STEREOTACTIC-GUIDED VACUUM ASSISTED BIOPSY OF THE LEFT BREAST AND
SPECIMEN RADIOGRAPH

[L CC]
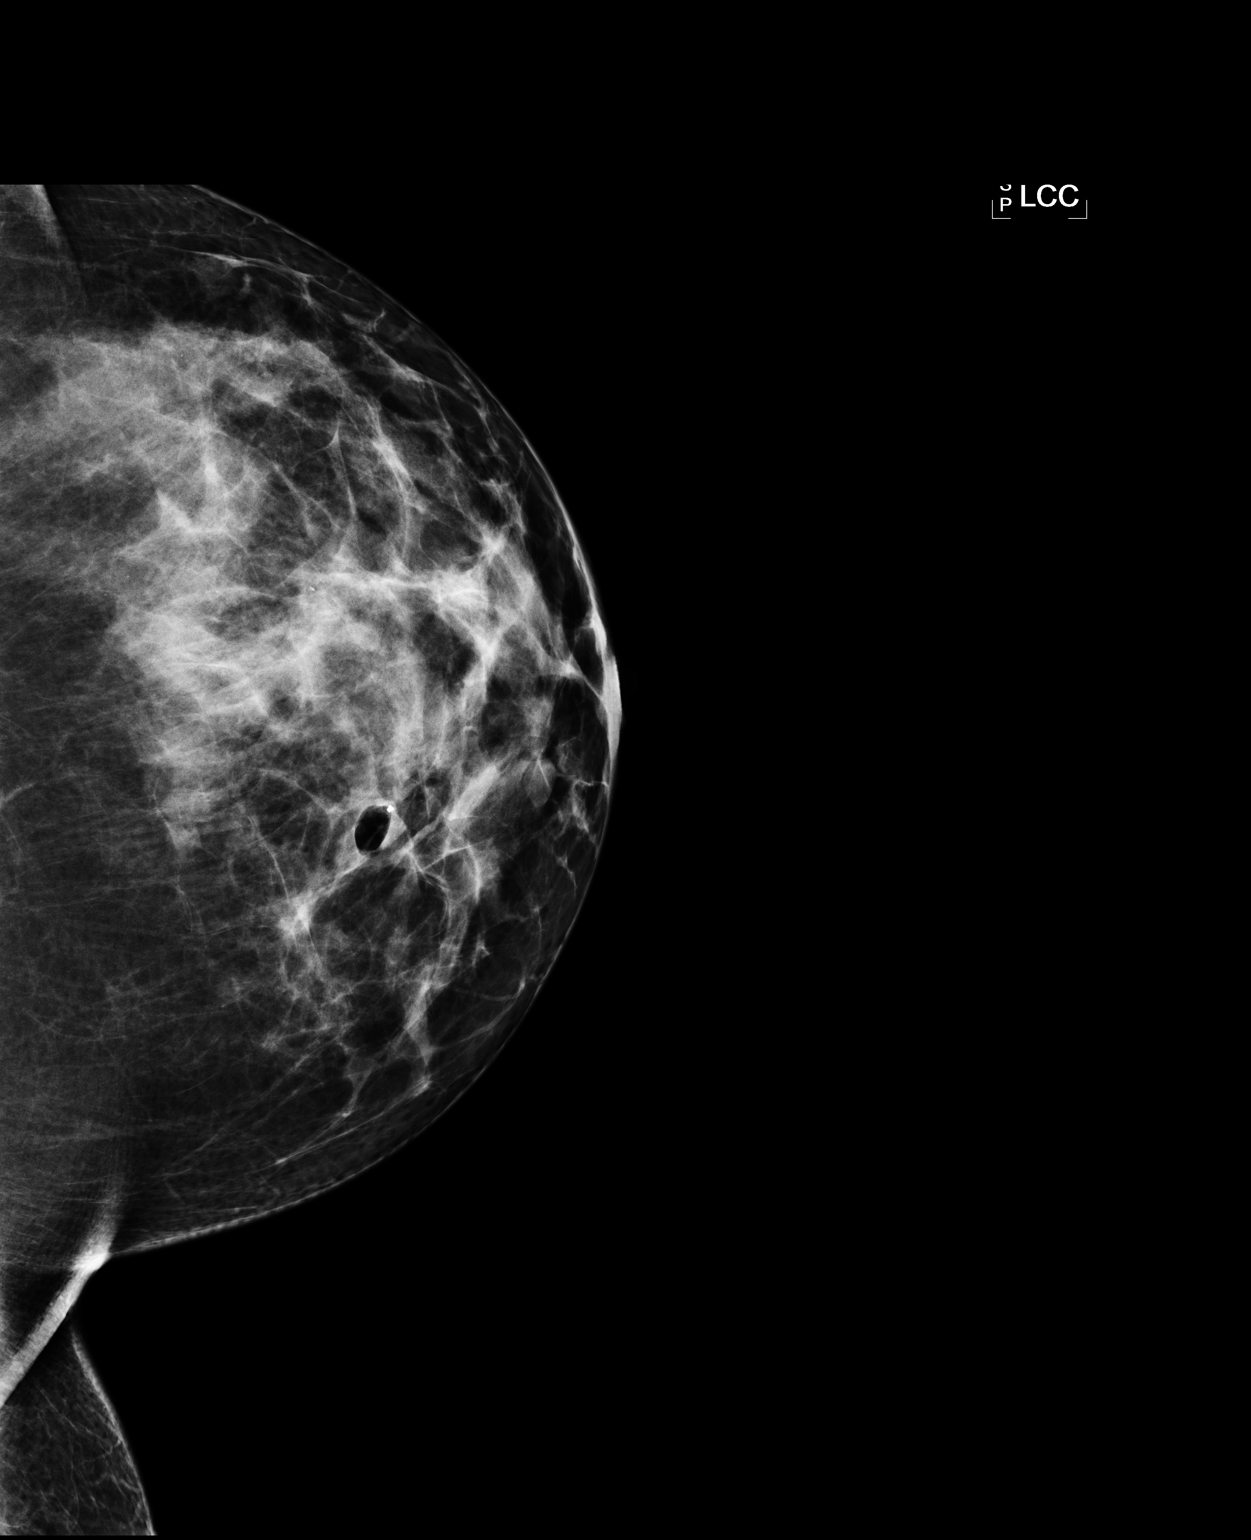

[L ML]
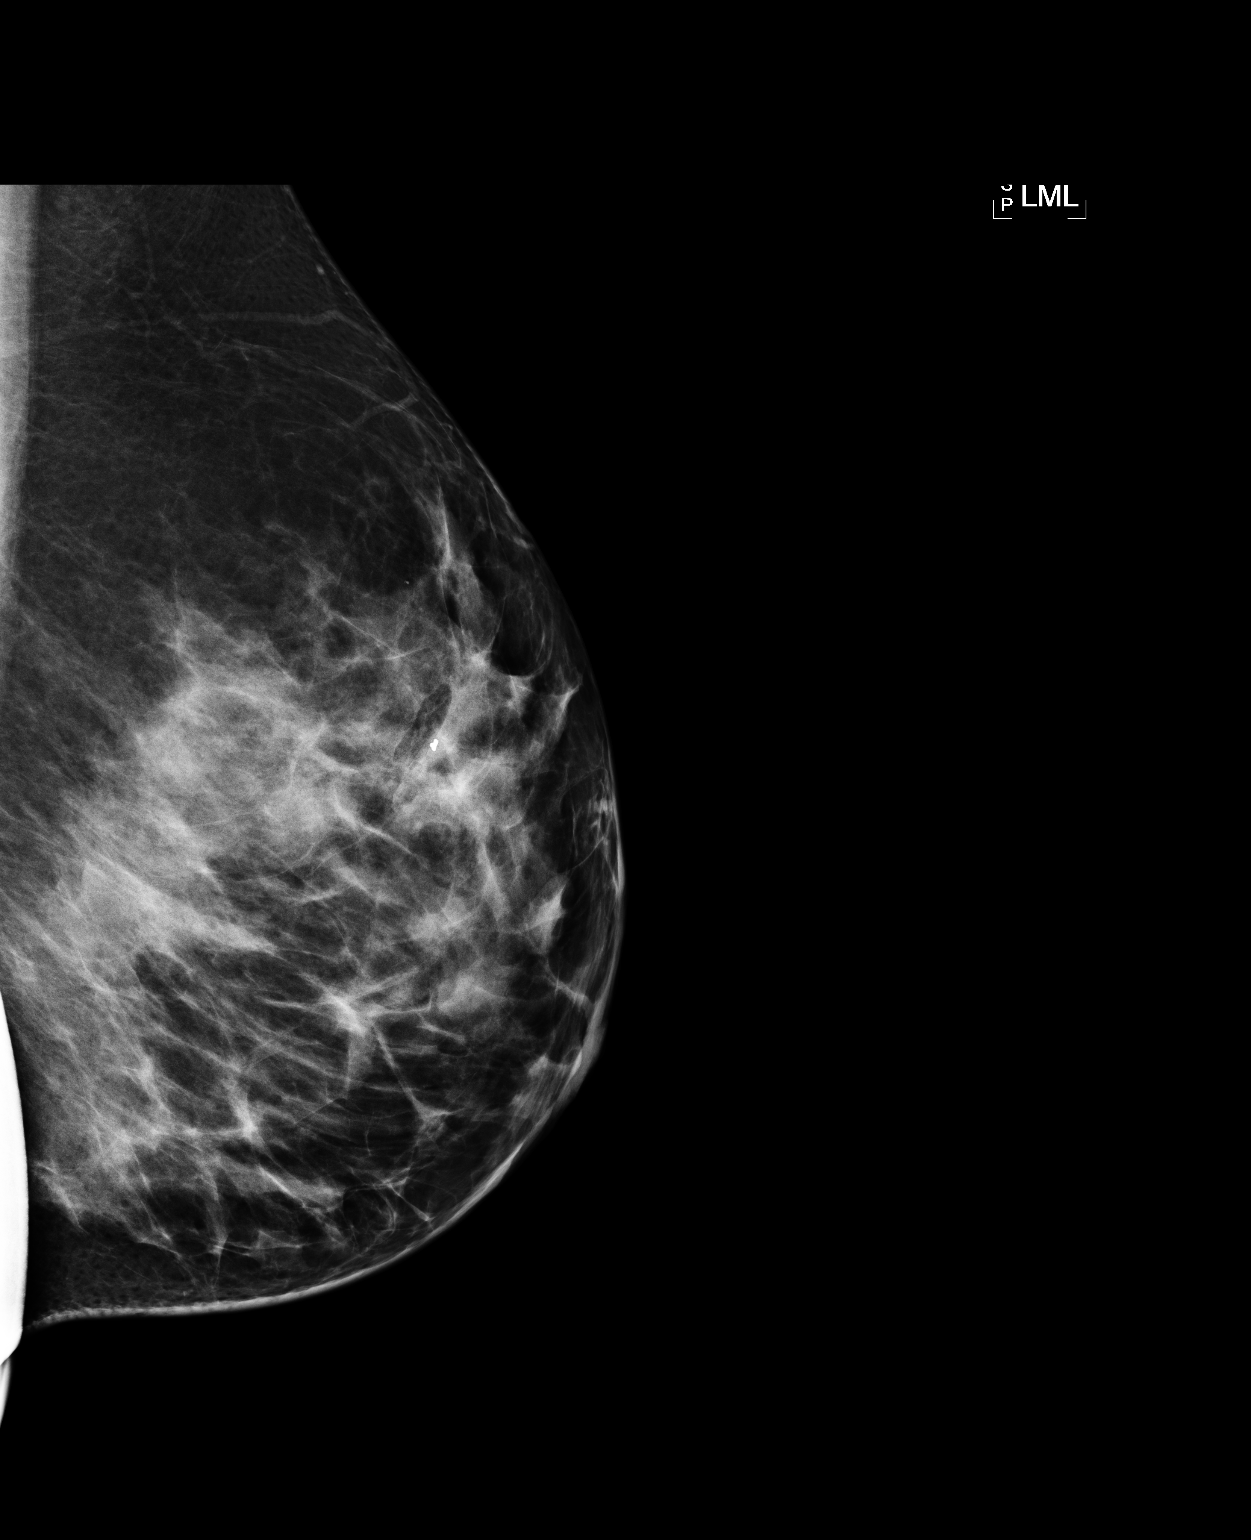

[2 of 2 positions shown; findings below may reference images not displayed]

I met with the patient and we discussed the procedure of
stereotactic-guided biopsy, including benefits and alternatives.
We discussed the high likelihood of a successful procedure. We
discussed the risks of the procedure, including infection,
bleeding, tissue injury, clip migration, and inadequate sampling.
Informed, written consent was given.

Using sterile technique, 2% lidocaine, stereotactic guidance, and a
9 gauge vacuum assisted device, biopsy was performed of the
calcifications in the 11 o'clock position of the left breast.
Specimen radiograph was performed, showing numerous calcifications.
Specimens with calcifications are identified for pathology.

At the conclusion of the procedure, a tissue marker clip was
deployed into the biopsy cavity.  Follow-up 2-view mammogram
confirmed clip to be placed in the correct position at the biopsy
site.
IMPRESSION: Stereotactic-guided biopsy of left breast calcifications.  No
apparent complications.

## 2012-04-07 ENCOUNTER — Other Ambulatory Visit (INDEPENDENT_AMBULATORY_CARE_PROVIDER_SITE_OTHER): Payer: Self-pay | Admitting: Surgery

## 2012-04-07 ENCOUNTER — Encounter (HOSPITAL_COMMUNITY): Payer: Self-pay | Admitting: Pharmacy Technician

## 2012-04-07 ENCOUNTER — Ambulatory Visit (INDEPENDENT_AMBULATORY_CARE_PROVIDER_SITE_OTHER): Payer: BC Managed Care – PPO | Admitting: Surgery

## 2012-04-07 ENCOUNTER — Encounter (INDEPENDENT_AMBULATORY_CARE_PROVIDER_SITE_OTHER): Payer: Self-pay | Admitting: Surgery

## 2012-04-07 VITALS — BP 124/78 | HR 100 | Temp 97.2°F | Resp 20 | Ht 65.5 in | Wt 180.2 lb

## 2012-04-07 DIAGNOSIS — N6099 Unspecified benign mammary dysplasia of unspecified breast: Secondary | ICD-10-CM | POA: Insufficient documentation

## 2012-04-07 DIAGNOSIS — N6089 Other benign mammary dysplasias of unspecified breast: Secondary | ICD-10-CM

## 2012-04-07 NOTE — Progress Notes (Signed)
Patient ID: Diana Tate, female   DOB: 04/02/1971, 41 y.o.   MRN: [id number]  Chief Complaint  Patient presents with  . Pre-op Exam    eval atypical ductal hyperplasia    HPI Diana Tate is a 41 y.o. female.  This patient is sent at the request of Dr.Rubner for abnormal left breast mammogram. She underwent left breast core biopsy which showed atypical ductal hyperplasia and was sent for consideration of excisional lumpectomy. She denies any history of breast mass, breast pain or nipple discharge bilaterally. No history of breast cancer or previous breast biopsy. HPI  Past Medical History  Diagnosis Date  . GERD (gastroesophageal reflux disease)   . Hyperlipidemia     Past Surgical History  Procedure Date  . Cholecystectomy   . Tonsillectomy 2000  . Endometrial ablation 2007    Family History  Problem Relation Age of Onset  . Cancer Maternal Uncle     lung  . Cancer Maternal Grandfather     skin    Social History History  Substance Use Topics  . Smoking status: Former Smoker -- 1.0 packs/day  . Smokeless tobacco: Never Used  . Alcohol Use: Yes     occ    No Known Allergies  Current Outpatient Prescriptions  Medication Sig Dispense Refill  . OVER THE COUNTER MEDICATION         Review of Systems Review of Systems  Constitutional: Negative for fever, chills and unexpected weight change.  HENT: Negative for hearing loss, congestion, sore throat, trouble swallowing and voice change.   Eyes: Negative for visual disturbance.  Respiratory: Negative for cough and wheezing.   Cardiovascular: Negative for chest pain, palpitations and leg swelling.  Gastrointestinal: Negative for nausea, vomiting, abdominal pain, diarrhea, constipation, blood in stool, abdominal distention and anal bleeding.  Genitourinary: Negative for hematuria, vaginal bleeding and difficulty urinating.  Musculoskeletal: Negative for arthralgias.  Skin: Negative for rash and wound.    Neurological: Negative for seizures, syncope and headaches.  Hematological: Negative for adenopathy. Does not bruise/bleed easily.  Psychiatric/Behavioral: Negative for confusion.    Blood pressure 124/78, pulse 100, temperature 97.2 F (36.2 C), temperature source Temporal, resp. rate 20, height 5' 5.5" (1.664 m), weight 180 lb 3.2 oz (81.738 kg).  Physical Exam Physical Exam  Constitutional: She is oriented to person, place, and time. She appears well-developed and well-nourished.  HENT:  Head: Normocephalic and atraumatic.  Eyes: EOM are normal. Pupils are equal, round, and reactive to light.  Neck: Normal range of motion. Neck supple.  Cardiovascular: Normal rate and regular rhythm.   Pulmonary/Chest: Effort normal and breath sounds normal. Right breast exhibits no inverted nipple, no mass, no nipple discharge, no skin change and no tenderness. Left breast exhibits no inverted nipple, no mass, no nipple discharge, no skin change and no tenderness. Breasts are symmetrical.    Musculoskeletal: Normal range of motion.  Lymphadenopathy:    She has no axillary adenopathy.  Neurological: She is alert and oriented to person, place, and time.  Skin: Skin is warm and dry.  Psychiatric: She has a normal mood and affect. Her behavior is normal. Judgment and thought content normal.    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 This  corresponds well with the imaging findings. The patient was  contacted by telephone, on 03/15/2012. She states the wound site  is clean and dry with no hematoma. Surgical consultation has been  scheduled with Dr. Yaritzy Huser on 04/07/2012.  **END ADDENDUM** SIGNED   BY: Dina L. Arceo, M.D.      Study Result     *RADIOLOGY REPORT*  Clinical Data: Left breast calcifications  STEREOTACTIC-GUIDED VACUUM ASSISTED BIOPSY OF THE LEFT BREAST AND  SPECIMEN RADIOGRAPH  Comparison: Previous exams  I met with the patient and we discussed  the procedure of  stereotactic-guided biopsy, including benefits and alternatives.  We discussed the high likelihood of a successful procedure. We  discussed the risks of the procedure, including infection,  bleeding, tissue injury, clip migration, and inadequate sampling.  Informed, written consent was given.  Using sterile technique, 2% lidocaine, stereotactic guidance, and a  9 gauge vacuum assisted device, biopsy was performed of the  calcifications in the 11 o'clock position of the left breast.  Specimen radiograph was performed, showing numerous calcifications.  Specimens with calcifications are identified for pathology.  At the conclusion of the procedure, a tissue marker clip was  deployed into the biopsy cavity. Follow-up 2-view mammogram  confirmed clip to be placed in the correct position at the biopsy  site.  IMPRESSION:  Stereotactic-guided biopsy of left breast calcifications. No  apparent complications.     Assessment    Left breast atypical ductal hyperplasia    Plan    Left breast needle localized lumpectomy.The procedure has been discussed with the patient. Alternatives to surgery have been discussed with the patient.  Risks of surgery include bleeding,  Infection,  Seroma formation, death,  and the need for further surgery.   The patient understands and wishes to proceed.       Bettyjean Stefanski A. 04/07/2012, 11:41 AM    

## 2012-04-07 NOTE — Patient Instructions (Signed)
Lumpectomy, Breast Conserving Surgery Care After Please read the instructions outlined below and refer to this sheet in the next few weeks. These discharge instructions provide you with general information on caring for yourself after you leave the hospital. Your surgeon may also give you specific instructions. While your treatment has been planned according to the most current medical practices available, unavoidable complications occasionally occur. If you have any problems or questions after discharge, please call your surgeon. Reasons for a lumpectomy: Any solid breast mass.  Grouped significant nodularity that may be confused with a solitary breast mass.  AFTER THE PROCEDURE After surgery, you will be taken to the recovery area where a nurse will watch and check your progress. Once you're awake, stable, and taking fluids well, barring other problems you will be allowed to go home.  Ice packs applied to your operative site may help with discomfort and keep the swelling down.  A small rubber drain may be placed in the incision for a couple of days to prevent a hematoma in the breast.  A pressure dressing may be applied for 24 to 48 hours to prevent bleeding.  Keep the wound dry.  You may resume a normal diet and activities as directed. Avoid strenuous activities affecting the arm on the side of the biopsy site such as tennis, swimming, heavy lifting (more than 10 pounds) or pulling.  Bruising in the breast is normal following this procedure.  Wearing a bra - even to bed - may be more comfortable and also help keep the dressing on.  Change dressings as directed.  Only take over-the-counter or prescription medicines for pain, discomfort, or fever as directed by your caregiver.  Call for your results as instructed by your surgeon. Remember it isyour responsibility to get the results of your lumpectomy if your surgeon asked you to follow-up. Do not assume everything is fine if you have not heard from  your caregiver. SEEK MEDICAL CARE IF:  There is increased bleeding (more than a small spot) from the wound.  You notice redness, swelling, or increasing pain in the wound.  Pus is coming from wound.  An unexplained oral temperature above 102 F (38.9 C) develops.  You notice a foul smell coming from the wound or dressing.  SEEK IMMEDIATE MEDICAL CARE IF:  You develop a rash.  You have difficulty breathing.  You have any allergic problems.  Document Released: 08/20/2006 Document Revised: 07/24/2011 Document Reviewed: 07/23/2007 [facility] Patient Information 2012 [place], [place].Lumpectomy, Breast Conserving Surgery A lumpectomy is breast surgery that removes only part of the breast. Another name used may be partial mastectomy. The amount removed varies. Make sure you understand how much of your breast will be removed. Reasons for a lumpectomy:  Any solid breast mass.   Grouped significant nodularity that may be confused with a solitary breast mass.  Lumpectomy is the most common form of breast cancer surgery today. The surgeon removes the portion of your breast which contains the tumor (cancer). This is the lump. Some normal tissue around the lump is also removed to be sure that all the tumor has been removed.  If cancer cells are found in the margins where the breast tissue was removed, your surgeon will do more surgery to remove the remaining cancer tissue. This is called re-excision surgery. Radiation and/or chemotherapy treatments are often given following a lumpectomy to kill any cancer cells that could possibly remain.  REASONS YOU MAY NOT BE ABLE TO HAVE BREAST CONSERVING SURGERY:  The  tumor is located in more than one place.   Your breast is small and the tumor is large so the breast would be disfigured.   The entire tumor removal is not successful with a lumpectomy.   You cannot commit to a full course of chemotherapy, radiation therapy or are pregnant and cannot have radiation.     You have previously had radiation to the breast to treat cancer.  HOW A LUMPECTOMY IS PERFORMED If overnight nursing is not required following a biopsy, a lumpectomy can be performed as a same-day surgery. This can be done in a hospital, clinic, or surgical center. The anesthesia used will depend on your surgeon. They will discuss this with you. A general anesthetic keeps you sleeping through the procedure. LET YOUR CAREGIVERS KNOW ABOUT THE FOLLOWING:  Allergies   Medications taken including herbs, eye drops, over the counter medications, and creams.   Use of steroids (by mouth or creams)   Previous problems with anesthetics or Novocaine.   Possibility of pregnancy, if this applies   History of blood clots (thrombophlebitis)   History of bleeding or blood problems.   Previous surgery   Other health problems  BEFORE THE PROCEDURE You should be present one hour prior to your procedure unless directed otherwise.  AFTER THE PROCEDURE  After surgery, you will be taken to the recovery area where a nurse will watch and check your progress. Once you're awake, stable, and taking fluids well, barring other problems you will be allowed to go home.   Ice packs applied to your operative site may help with discomfort and keep the swelling down.   A small rubber drain may be placed in the breast for a couple of days to prevent a hematoma from developing in the breast.   A pressure dressing may be applied for 24 to 48 hours to prevent bleeding.   Keep the wound dry.   You may resume a normal diet and activities as directed. Avoid strenuous activities affecting the arm on the side of the biopsy site such as tennis, swimming, heavy lifting (more than 10 pounds) or pulling.   Bruising in the breast is normal following this procedure.   Wearing a bra - even to bed - may be more comfortable and also help keep the dressing on.   Change dressings as directed.   Only take over-the-counter or  prescription medicines for pain, discomfort, or fever as directed by your caregiver.  Call for your results as instructed by your surgeon. Remember it is your responsibility to get the results of your lumpectomy if your surgeon asked you to follow-up. Do not assume everything is fine if you have not heard from your caregiver. SEEK MEDICAL CARE IF:   There is increased bleeding (more than a small spot) from the wound.   You notice redness, swelling, or increasing pain in the wound.   Pus is coming from wound.   An unexplained oral temperature above 102 F (38.9 C) develops.   You notice a foul smell coming from the wound or dressing.  SEEK IMMEDIATE MEDICAL CARE IF:   You develop a rash.   You have difficulty breathing.   You have any allergic problems.  Document Released: 09/15/2006 Document Revised: 07/24/2011 Document Reviewed: 12/17/2006 [facility] Patient Information 2012 [place], [place].

## 2012-04-12 NOTE — Pre-Procedure Instructions (Signed)
19 Diana Tate  04/12/2012   Your procedure is scheduled on:  Tuesday, September 3rd.  Report to [facility] at 7:30AM.  Call this number if you have problems the morning of surgery: [phone]   Remember:   Do not eat food or drink any liquid:After Midnight.      Take these medicines the morning of surgery with A SIP OF WATER: None.  Do not take any Aspirin, NSAIDs, Coumadin , Plavix, Effient or Herbal medictions.   Do not wear jewelry, make-up or nail polish.  Do not wear lotions, powders, or perfumes. You may wear deodorant.  Do not shave 48 hours prior to surgery. Men may shave face and neck.  Do not bring valuables to the hospital.  Contacts, dentures or bridgework may not be worn into surgery.  Leave suitcase in the car. After surgery it may be brought to your room.  For patients admitted to the hospital, checkout time is 11:00 AM the day of discharge.   Patients discharged the day of surgery will not be allowed to drive home.  Name and phone number of your driver: _____________  Special Instructions: CHG Shower Use Special Wash: 1/2 bottle night before surgery and 1/2 bottle morning of surgery.   Please read over the following fact sheets that you were given: Pain Booklet, Coughing and Deep Breathing and Surgical Site Infection Prevention

## 2012-04-13 ENCOUNTER — Encounter (HOSPITAL_COMMUNITY)
Admission: RE | Admit: 2012-04-13 | Discharge: 2012-04-13 | Disposition: A | Discharge status: Home / Self Care | Payer: BC Managed Care – PPO | Source: Ambulatory Visit | Attending: Surgery | Admitting: Surgery

## 2012-04-13 ENCOUNTER — Encounter (HOSPITAL_COMMUNITY): Payer: Self-pay

## 2012-04-13 VITALS — BP 113/79 | HR 98 | Temp 98.3°F | Resp 20 | Ht 65.0 in | Wt 179.7 lb

## 2012-04-13 DIAGNOSIS — N6099 Unspecified benign mammary dysplasia of unspecified breast: Secondary | ICD-10-CM

## 2012-04-13 LAB — COMPREHENSIVE METABOLIC PANEL
ALT: 12 U/L (ref 0–35)
AST: 12 U/L (ref 0–37)
Albumin: 3.6 g/dL (ref 3.5–5.2)
Alkaline Phosphatase: 83 U/L (ref 39–117)
BUN: 8 mg/dL (ref 6–23)
CO2: 25 mEq/L (ref 19–32)
Calcium: 9.8 mg/dL (ref 8.4–10.5)
Chloride: 102 mEq/L (ref 96–112)
Creatinine, Ser: 0.7 mg/dL (ref 0.50–1.10)
GFR calc Af Amer: >90 mL/min (ref >90)
GFR calc non Af Amer: >90 mL/min (ref >90)
Glucose, Bld: 97 mg/dL (ref 70–99)
Potassium: 4 mEq/L (ref 3.5–5.1)
Sodium: 136 mEq/L (ref 135–145)
Total Bilirubin: 0.3 mg/dL (ref 0.3–1.2)
Total Protein: 7.2 g/dL (ref 6.0–8.3)

## 2012-04-13 LAB — CBC WITH DIFFERENTIAL/PLATELET
Basophils Absolute: 0 10*3/uL (ref 0.0–0.1)
Basophils Relative: 0 % (ref 0–1)
Eosinophils Absolute: 0.1 10*3/uL (ref 0.0–0.7)
Eosinophils Relative: 1 % (ref 0–5)
HCT: 42 % (ref 36.0–46.0)
Hemoglobin: 14.1 g/dL (ref 12.0–15.0)
Lymphocytes Relative: 25 % (ref 12–46)
Lymphs Abs: 2.5 10*3/uL (ref 0.7–4.0)
MCH: 29 pg (ref 26.0–34.0)
MCHC: 33.6 g/dL (ref 30.0–36.0)
MCV: 86.2 fL (ref 78.0–100.0)
Monocytes Absolute: 0.8 10*3/uL (ref 0.1–1.0)
Monocytes Relative: 8 % (ref 3–12)
Neutro Abs: 6.4 10*3/uL (ref 1.7–7.7)
Neutrophils Relative %: 66 % (ref 43–77)
Platelets: 272 10*3/uL (ref 150–400)
RBC: 4.87 MIL/uL (ref 3.87–5.11)
RDW: 13.9 % (ref 11.5–15.5)
WBC: 9.8 10*3/uL (ref 4.0–10.5)

## 2012-04-13 LAB — SURGICAL PCR SCREEN: Staphylococcus aureus: POSITIVE — AB

## 2012-04-13 IMAGING — CR DG CHEST 2V
2 series · 2 of 2 positions shown · non-contrast
Comparison: None

CLINICAL DATA: Atypical ductal hyperplasia left breast,
preoperative assessment for preoperative needle localization and
lumpectomy

CHEST - 2 VIEW

[view not recorded (1 of 2)]
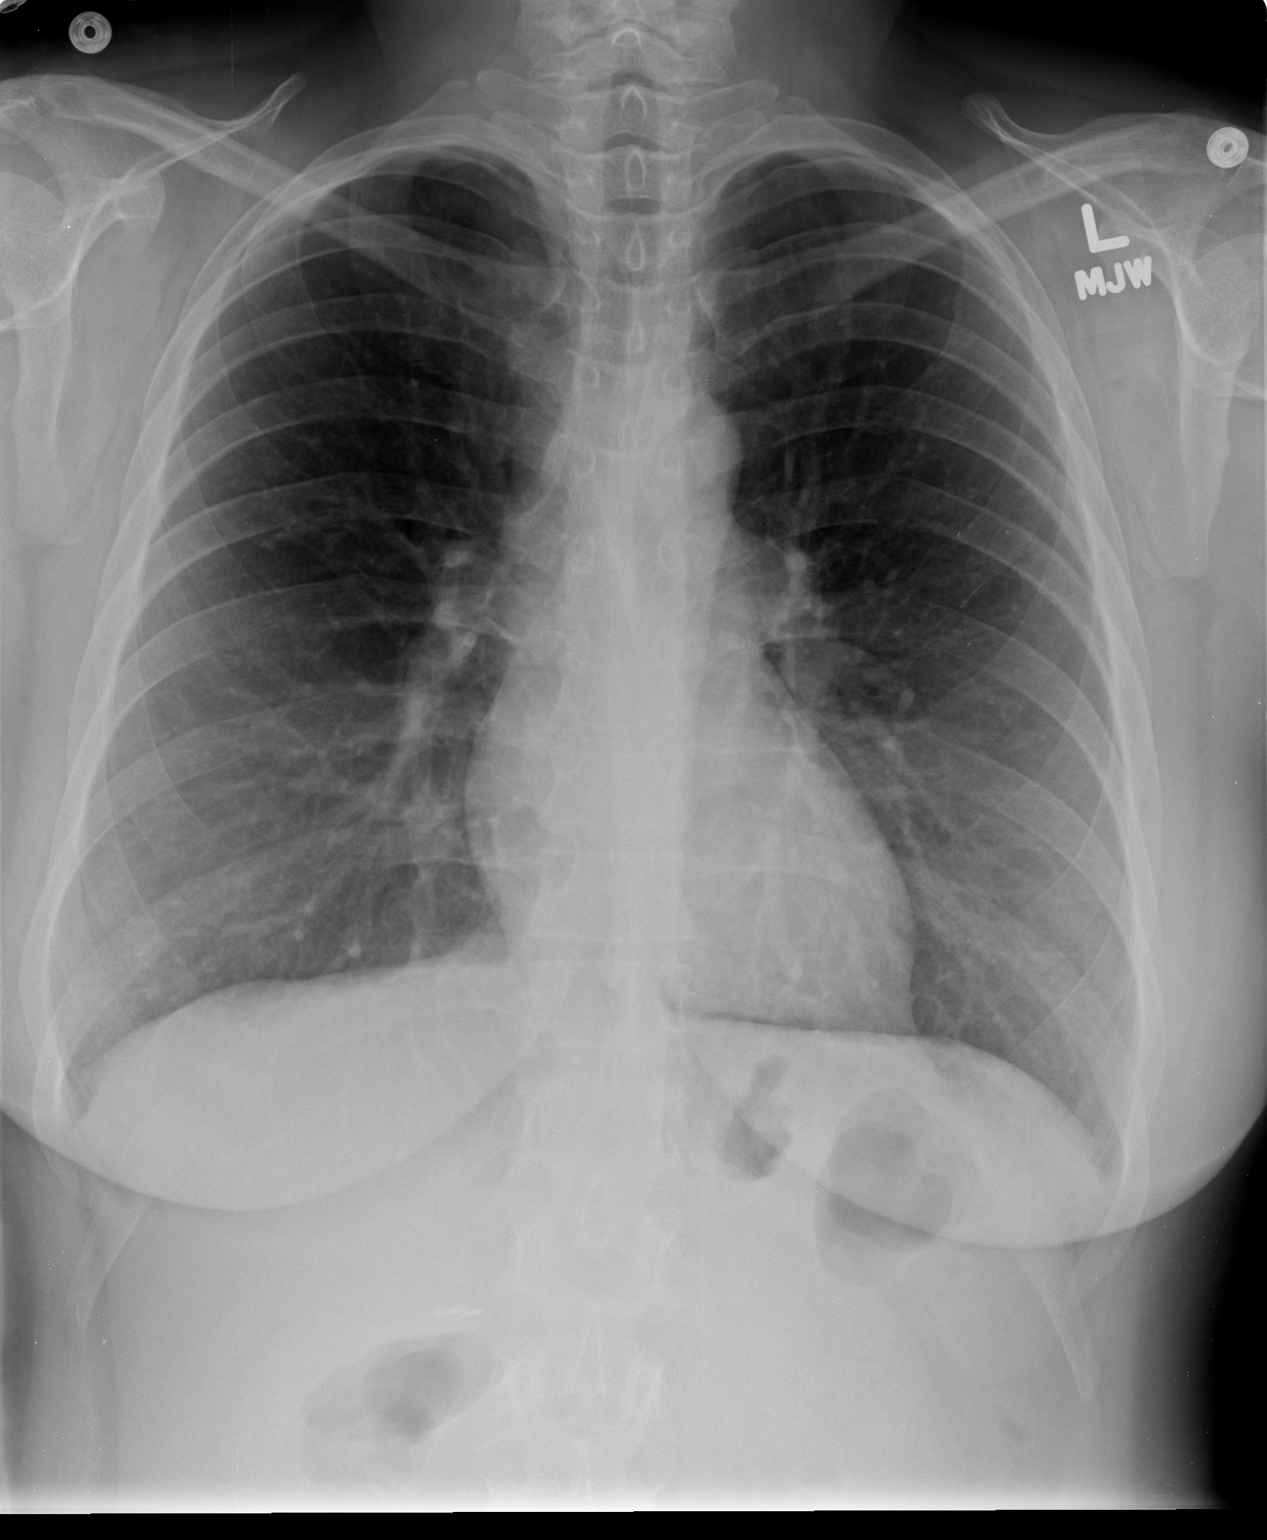

[view not recorded (2 of 2)]
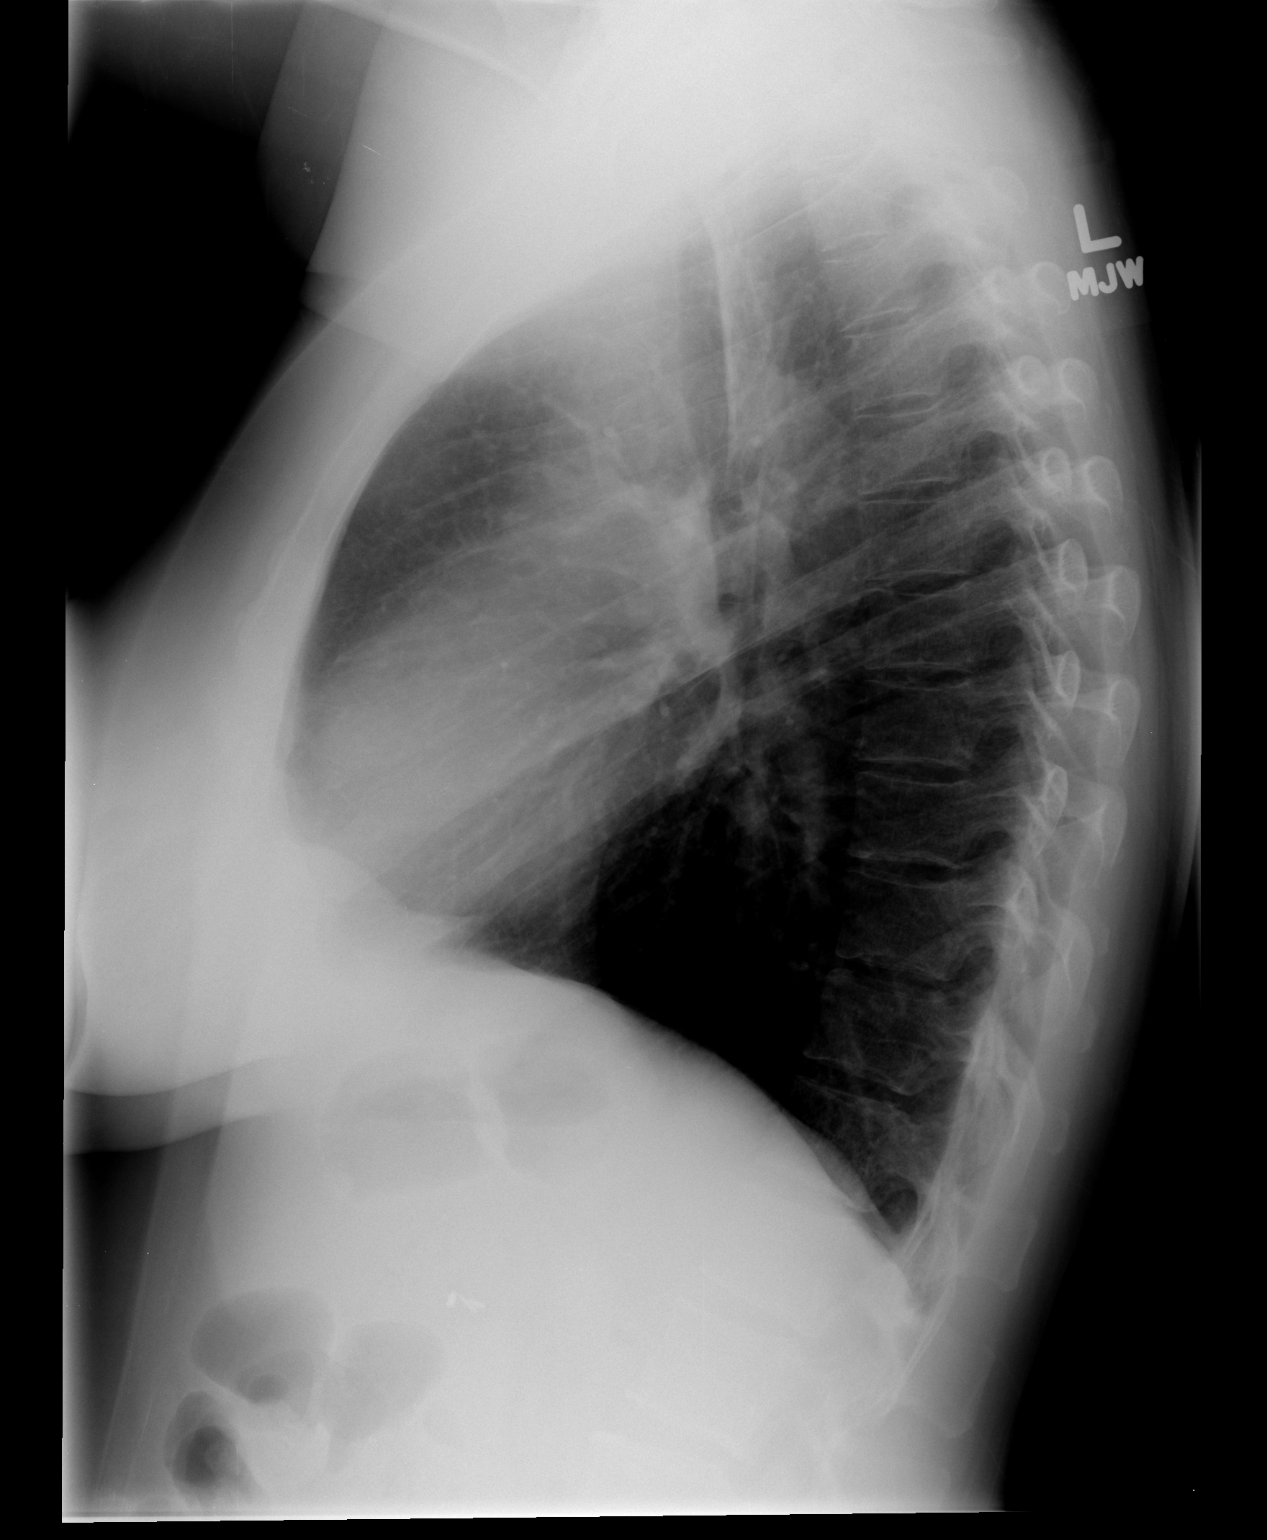

[2 of 2 positions shown; findings below may reference images not displayed]

FINDINGS: Normal heart size, mediastinal contours, and pulmonary vascularity.
Lungs clear.
No pleural effusion or pneumothorax.
No acute osseous abnormalities.
IMPRESSION: No acute abnormalities.

## 2012-04-13 NOTE — Pre-Procedure Instructions (Signed)
20 Diana Tate  04/13/2012   Your procedure is scheduled on:  04-20-2012  Report to [facility]  COME TO THE SHORT STAY CENTER AS SOON AS FINISHED AT BREAST CENTER    Call this number if you have problems the morning of surgery: [phone]   Remember:     Take these medicines the morning of surgery with A SIP OF WATER:    Do not wear jewelry, make-up or nail polish.  Do not wear lotions, powders, or perfumes. You may wear deodorant.  Do not shave 48 hours prior to surgery. Men may shave face and neck.  Do not bring valuables to the hospital.  Contacts, dentures or bridgework may not be worn into surgery.  Leave suitcase in the car. After surgery it may be brought to your room.  For patients admitted to the hospital, checkout time is 11:00 AM the day of discharge.   Patients discharged the day of surgery will not be allowed to drive home.  Name and phone number of your driver: _________________________     Special Instructions: CHG Shower Use Special Wash: 1/2 bottle night before surgery and 1/2 bottle morning of surgery.     Please read over the following fact sheets that you were given: Pain Booklet, MRSA Information and Surgical Site Infection Prevention

## 2012-04-18 HISTORY — PX: BREAST EXCISIONAL BIOPSY: SUR124

## 2012-04-19 MED ORDER — CEFAZOLIN SODIUM-DEXTROSE 2-3 GM-% IV SOLR
2.0000 g | INTRAVENOUS | Status: AC
  Administered 2012-04-20: 2 g via INTRAVENOUS
  Filled 2012-04-19: qty 50

## 2012-04-20 ENCOUNTER — Ambulatory Visit
Admission: RE | Admit: 2012-04-20 | Discharge: 2012-04-20 | Disposition: A | Discharge status: Home / Self Care | Payer: BC Managed Care – PPO | Source: Ambulatory Visit | Attending: Surgery | Admitting: Surgery

## 2012-04-20 ENCOUNTER — Encounter (HOSPITAL_COMMUNITY)
Admission: RE | Disposition: A | Discharge status: Home / Self Care | Payer: Self-pay | Source: Ambulatory Visit | Attending: Surgery

## 2012-04-20 ENCOUNTER — Ambulatory Visit (HOSPITAL_COMMUNITY): Payer: BC Managed Care – PPO | Admitting: Anesthesiology

## 2012-04-20 ENCOUNTER — Encounter (HOSPITAL_COMMUNITY): Payer: Self-pay | Admitting: Anesthesiology

## 2012-04-20 ENCOUNTER — Encounter (HOSPITAL_COMMUNITY): Payer: Self-pay | Admitting: *Deleted

## 2012-04-20 ENCOUNTER — Ambulatory Visit (HOSPITAL_COMMUNITY)
Admission: RE | Admit: 2012-04-20 | Discharge: 2012-04-20 | Disposition: A | Discharge status: Home / Self Care | Payer: BC Managed Care – PPO | Source: Ambulatory Visit | Attending: Surgery | Admitting: Surgery

## 2012-04-20 DIAGNOSIS — Z9089 Acquired absence of other organs: Secondary | ICD-10-CM | POA: Insufficient documentation

## 2012-04-20 DIAGNOSIS — N6089 Other benign mammary dysplasias of unspecified breast: Secondary | ICD-10-CM

## 2012-04-20 DIAGNOSIS — N6099 Unspecified benign mammary dysplasia of unspecified breast: Secondary | ICD-10-CM

## 2012-04-20 DIAGNOSIS — E785 Hyperlipidemia, unspecified: Secondary | ICD-10-CM | POA: Insufficient documentation

## 2012-04-20 DIAGNOSIS — K219 Gastro-esophageal reflux disease without esophagitis: Secondary | ICD-10-CM | POA: Insufficient documentation

## 2012-04-20 DIAGNOSIS — Z87891 Personal history of nicotine dependence: Secondary | ICD-10-CM | POA: Insufficient documentation

## 2012-04-20 IMAGING — MG MM BREAST WIRE LOCALIZATION*L*
3 series · 3 of 3 positions shown · non-contrast
Comparison: Previous exams.

CLINICAL DATA: Needle localization prior to surgical excision of
left breast atypical ductal hyperplasia.

NEEDLE LOCALIZATION WITH MAMMOGRAPHIC GUIDANCE AND SPECIMEN
RADIOGRAPH

[L ML]
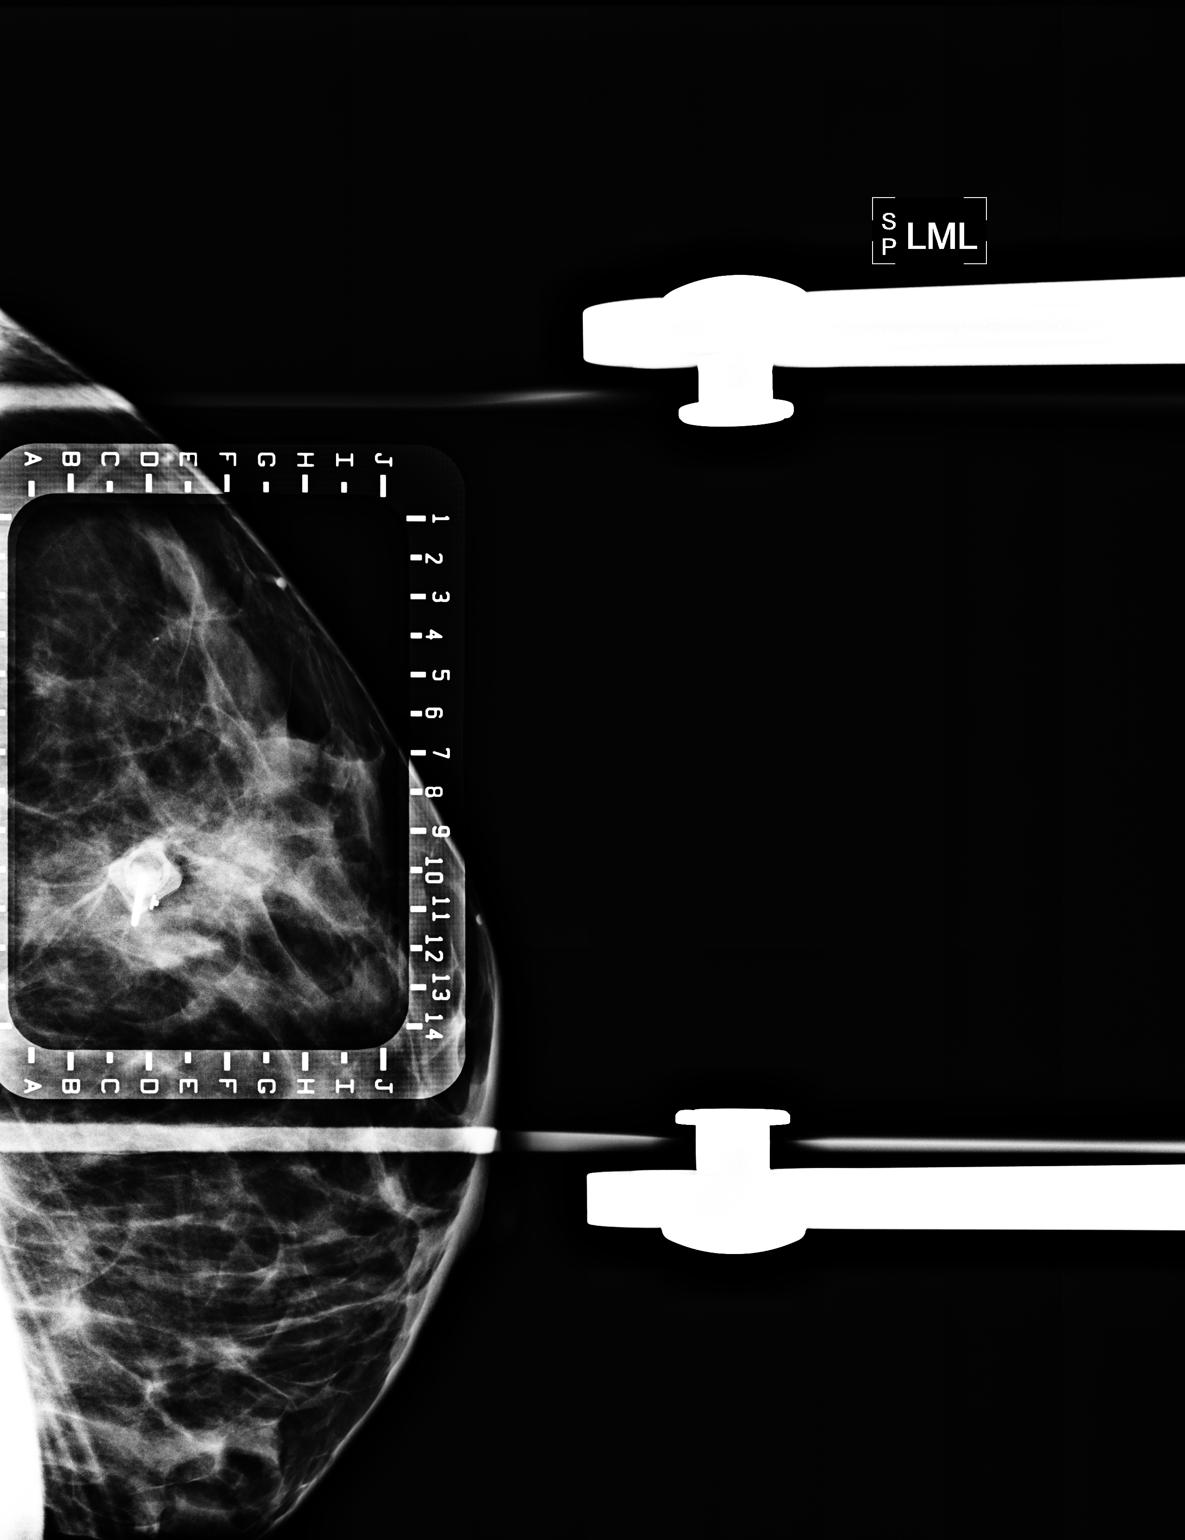

[L CC (1 of 2)]
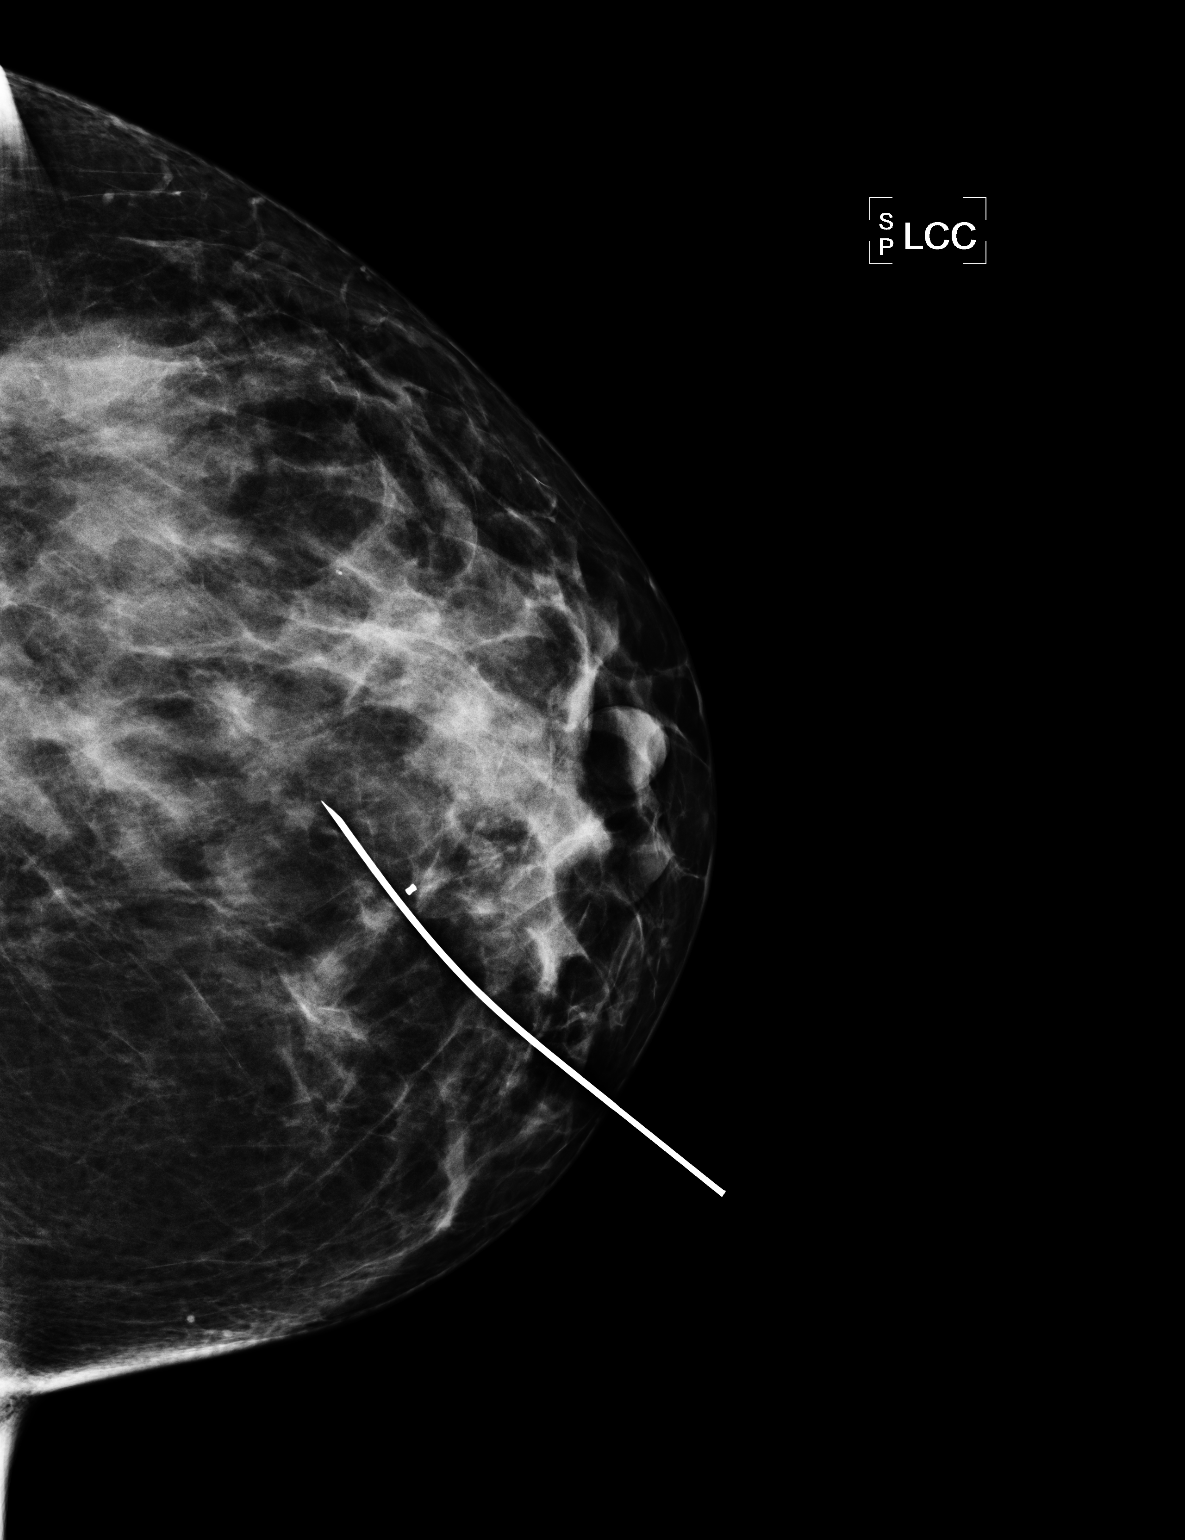

[L CC (2 of 2)]
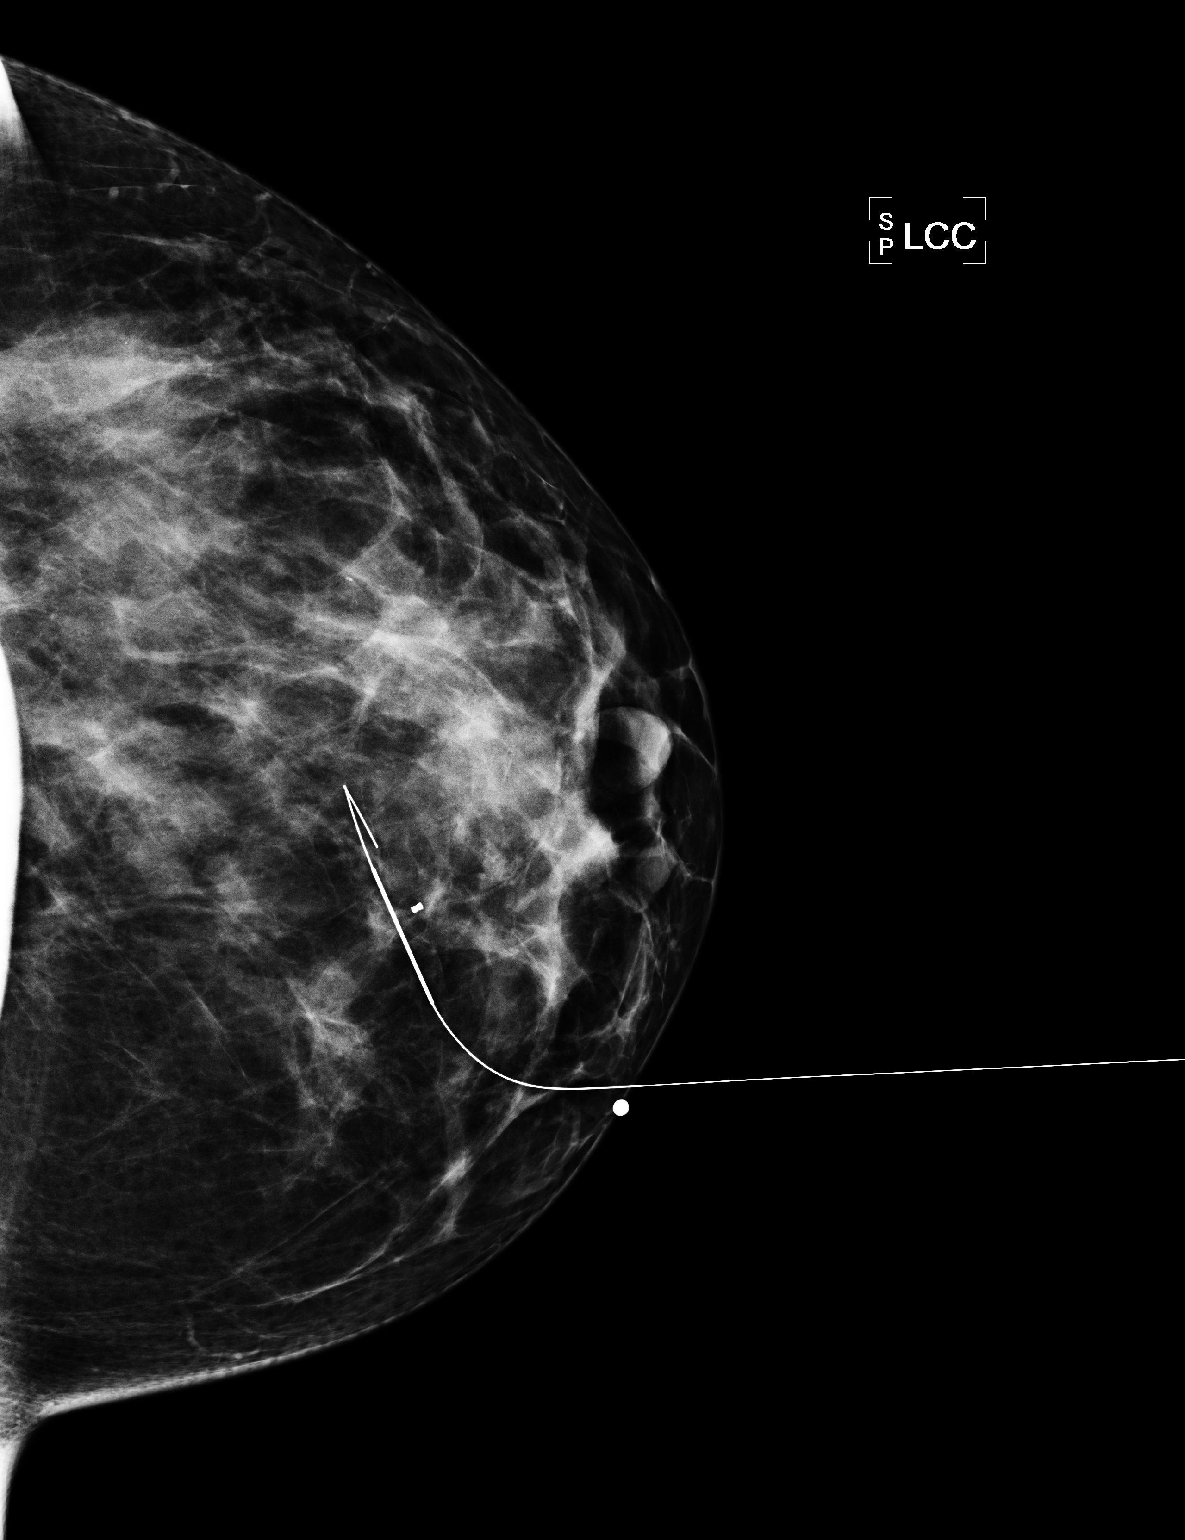

[3 of 3 positions shown; findings below may reference images not displayed]

Patient presents for needle localization prior to surgical excision
of the left breast lesion.  I met with the patient and we discussed
the procedure of needle localization including benefits and
alternatives. We discussed the high likelihood of a successful
procedure. We discussed the risks of the procedure, including
infection, bleeding, tissue injury, and further surgery. Informed,
written consent was given.

Using mammographic guidance, sterile technique, 2% lidocaine and a
7 cm modified Kopans needle, the clip was localized using a medial
approach.  The films are marked for Dr. ZURDO.

Specimen radiograph was performed at surgery, and confirms the clip
and intact wire to be present in the tissue sample.  The specimen
is marked for pathology.
IMPRESSION: Needle localization of the lesion within the left breast as
discussed above.  No apparent complications.

## 2012-04-20 SURGERY — BREAST LUMPECTOMY WITH NEEDLE LOCALIZATION
Anesthesia: General | Site: Breast | Laterality: Left | Wound class: Clean

## 2012-04-20 MED ORDER — HYDROMORPHONE HCL PF 1 MG/ML IJ SOLN
INTRAMUSCULAR | Status: AC
  Administered 2012-04-20: 0.5 mg via INTRAVENOUS
  Filled 2012-04-20: qty 1

## 2012-04-20 MED ORDER — OXYCODONE-ACETAMINOPHEN 5-325 MG PO TABS: 1.0000 | ORAL_TABLET | ORAL | Status: AC | PRN

## 2012-04-20 MED ORDER — BUPIVACAINE-EPINEPHRINE 0.25% -1:200000 IJ SOLN
INTRAMUSCULAR | Status: DC | PRN
  Administered 2012-04-20: 10 mL

## 2012-04-20 MED ORDER — ONDANSETRON HCL 4 MG/2ML IJ SOLN
INTRAMUSCULAR | Status: DC | PRN
  Administered 2012-04-20: 4 mg via INTRAVENOUS

## 2012-04-20 MED ORDER — 0.9 % SODIUM CHLORIDE (POUR BTL) OPTIME
TOPICAL | Status: DC | PRN
  Administered 2012-04-20: 1000 mL

## 2012-04-20 MED ORDER — DEXAMETHASONE SODIUM PHOSPHATE 4 MG/ML IJ SOLN
INTRAMUSCULAR | Status: DC | PRN
  Administered 2012-04-20: 8 mg via INTRAVENOUS

## 2012-04-20 MED ORDER — LACTATED RINGERS IV SOLN
INTRAVENOUS | Status: DC | PRN
  Administered 2012-04-20: 10:00:00 via INTRAVENOUS

## 2012-04-20 MED ORDER — ONDANSETRON HCL 4 MG/2ML IJ SOLN: 4.0000 mg | Freq: Once | INTRAMUSCULAR | Status: DC | PRN

## 2012-04-20 MED ORDER — MIDAZOLAM HCL 5 MG/5ML IJ SOLN
INTRAMUSCULAR | Status: DC | PRN
  Administered 2012-04-20: 2 mg via INTRAVENOUS

## 2012-04-20 MED ORDER — LIDOCAINE HCL (CARDIAC) 20 MG/ML IV SOLN
INTRAVENOUS | Status: DC | PRN
  Administered 2012-04-20: 100 mg via INTRAVENOUS

## 2012-04-20 MED ORDER — BUPIVACAINE-EPINEPHRINE PF 0.25-1:200000 % IJ SOLN
INTRAMUSCULAR | Status: AC
  Filled 2012-04-20: qty 30

## 2012-04-20 MED ORDER — HYDROMORPHONE HCL PF 1 MG/ML IJ SOLN
0.2500 mg | INTRAMUSCULAR | Status: DC | PRN
  Administered 2012-04-20: 0.5 mg via INTRAVENOUS

## 2012-04-20 MED ORDER — DEXTROSE 5 % IV SOLN
INTRAVENOUS | Status: DC | PRN
  Administered 2012-04-20 (×2): via INTRAVENOUS

## 2012-04-20 MED ORDER — ACETAMINOPHEN 10 MG/ML IV SOLN
INTRAVENOUS | Status: DC | PRN
  Administered 2012-04-20: 1000 mg via INTRAVENOUS

## 2012-04-20 MED ORDER — PROPOFOL 10 MG/ML IV EMUL
INTRAVENOUS | Status: DC | PRN
  Administered 2012-04-20: 200 mg via INTRAVENOUS

## 2012-04-20 MED ORDER — FENTANYL CITRATE 0.05 MG/ML IJ SOLN
INTRAMUSCULAR | Status: DC | PRN
  Administered 2012-04-20 (×2): 50 ug via INTRAVENOUS
  Administered 2012-04-20: 100 ug via INTRAVENOUS

## 2012-04-20 MED ORDER — ACETAMINOPHEN 10 MG/ML IV SOLN
INTRAVENOUS | Status: AC
  Filled 2012-04-20: qty 100

## 2012-04-20 SURGICAL SUPPLY — 41 items
ADH SKN CLS APL DERMABOND .7 (GAUZE/BANDAGES/DRESSINGS) ×1
BINDER BREAST LRG (GAUZE/BANDAGES/DRESSINGS) ×1 IMPLANT
BLADE SURG 15 STRL LF DISP TIS (BLADE) ×1 IMPLANT
BLADE SURG 15 STRL SS (BLADE) ×2
CANISTER SUCTION 2500CC (MISCELLANEOUS) ×1 IMPLANT
CHLORAPREP W/TINT 26ML (MISCELLANEOUS) ×2 IMPLANT
CLOTH BEACON ORANGE TIMEOUT ST (SAFETY) ×2 IMPLANT
COVER SURGICAL LIGHT HANDLE (MISCELLANEOUS) ×2 IMPLANT
DERMABOND ADVANCED (GAUZE/BANDAGES/DRESSINGS) ×1
DERMABOND ADVANCED .7 DNX12 (GAUZE/BANDAGES/DRESSINGS) ×1 IMPLANT
DEVICE DUBIN SPECIMEN MAMMOGRA (MISCELLANEOUS) ×2 IMPLANT
DRAPE CHEST BREAST 15X10 FENES (DRAPES) ×2 IMPLANT
DRAPE UTILITY 15X26 W/TAPE STR (DRAPE) ×4 IMPLANT
ELECT CAUTERY BLADE 6.4 (BLADE) ×2 IMPLANT
ELECT REM PT RETURN 9FT ADLT (ELECTROSURGICAL) ×2
ELECTRODE REM PT RTRN 9FT ADLT (ELECTROSURGICAL) ×1 IMPLANT
GLOVE BIO SURGEON STRL SZ8 (GLOVE) ×2 IMPLANT
GLOVE BIOGEL PI IND STRL 8 (GLOVE) ×1 IMPLANT
GLOVE BIOGEL PI INDICATOR 8 (GLOVE) ×1
GLOVE SURG ORTHO 7.0 STRL STRW (GLOVE) ×1 IMPLANT
GLOVE SURG SS PI 6.5 STRL IVOR (GLOVE) ×1 IMPLANT
GOWN STRL NON-REIN LRG LVL3 (GOWN DISPOSABLE) ×4 IMPLANT
KIT BASIN OR (CUSTOM PROCEDURE TRAY) ×2 IMPLANT
KIT ROOM TURNOVER OR (KITS) ×2 IMPLANT
NDL HYPO 25GX1X1/2 BEV (NEEDLE) ×1 IMPLANT
NEEDLE HYPO 25GX1X1/2 BEV (NEEDLE) ×2 IMPLANT
NS IRRIG 1000ML POUR BTL (IV SOLUTION) ×2 IMPLANT
PACK SURGICAL SETUP 50X90 (CUSTOM PROCEDURE TRAY) ×2 IMPLANT
PAD ARMBOARD 7.5X6 YLW CONV (MISCELLANEOUS) ×2 IMPLANT
PENCIL BUTTON HOLSTER BLD 10FT (ELECTRODE) ×2 IMPLANT
SPONGE LAP 4X18 X RAY DECT (DISPOSABLE) ×2 IMPLANT
SUT MON AB 4-0 PC3 18 (SUTURE) ×2 IMPLANT
SUT SILK 2 0 SH (SUTURE) ×1 IMPLANT
SUT VIC AB 3-0 SH 27 (SUTURE) ×2
SUT VIC AB 3-0 SH 27XBRD (SUTURE) ×1 IMPLANT
SYR BULB 3OZ (MISCELLANEOUS) ×2 IMPLANT
SYR CONTROL 10ML LL (SYRINGE) ×2 IMPLANT
TOWEL OR 17X24 6PK STRL BLUE (TOWEL DISPOSABLE) ×2 IMPLANT
TOWEL OR 17X26 10 PK STRL BLUE (TOWEL DISPOSABLE) ×2 IMPLANT
TUBE CONNECTING 12X1/4 (SUCTIONS) ×1 IMPLANT
YANKAUER SUCT BULB TIP NO VENT (SUCTIONS) ×1 IMPLANT

## 2012-04-20 NOTE — Anesthesia Postprocedure Evaluation (Signed)
  Anesthesia Post-op Note  Patient: Diana Tate  Procedure(s) Performed: Procedure(s) (LRB) with comments: BREAST LUMPECTOMY WITH NEEDLE LOCALIZATION (Left) - Left breast Needle localization lumpectomy  Patient Location: PACU  Anesthesia Type: General  Level of Consciousness: awake, alert  and oriented  Airway and Oxygen Therapy: Patient Spontanous Breathing and Patient connected to nasal cannula oxygen  Post-op Pain: mild  Post-op Assessment: Post-op Vital signs reviewed  Post-op Vital Signs: Reviewed  Complications: No apparent anesthesia complications

## 2012-04-20 NOTE — H&P (View-Only) (Signed)
Patient ID: Diana Tate, female   DOB: [birth year], 41 y.o.   MRN: [id number]  Chief Complaint  Patient presents with  . Pre-op Exam    eval atypical ductal hyperplasia    HPI Diana Tate Diana Tate is a 41 y.o. female.  This patient is sent at the request of Dr.Rubner for abnormal left breast mammogram. She underwent left breast core biopsy which showed atypical ductal hyperplasia and was sent for consideration of excisional lumpectomy. She denies any history of breast mass, breast pain or nipple discharge bilaterally. No history of breast cancer or previous breast biopsy. HPI  Past Medical History  Diagnosis Date  . GERD (gastroesophageal reflux disease)   . Hyperlipidemia     Past Surgical History  Procedure Date  . Cholecystectomy   . Tonsillectomy 2000  . Endometrial ablation 2007    Family History  Problem Relation Age of Onset  . Cancer Maternal Uncle     lung  . Cancer Maternal Grandfather     skin    Social History History  Substance Use Topics  . Smoking status: Former Smoker -- 1.0 packs/day  . Smokeless tobacco: Never Used  . Alcohol Use: Yes     occ    No Known Allergies  Current Outpatient Prescriptions  Medication Sig Dispense Refill  . OVER THE COUNTER MEDICATION         Review of Systems Review of Systems  Constitutional: Negative for fever, chills and unexpected weight change.  HENT: Negative for hearing loss, congestion, sore throat, trouble swallowing and voice change.   Eyes: Negative for visual disturbance.  Respiratory: Negative for cough and wheezing.   Cardiovascular: Negative for chest pain, palpitations and leg swelling.  Gastrointestinal: Negative for nausea, vomiting, abdominal pain, diarrhea, constipation, blood in stool, abdominal distention and anal bleeding.  Genitourinary: Negative for hematuria, vaginal bleeding and difficulty urinating.  Musculoskeletal: Negative for arthralgias.  Skin: Negative for rash and wound.    Neurological: Negative for seizures, syncope and headaches.  Hematological: Negative for adenopathy. Does not bruise/bleed easily.  Psychiatric/Behavioral: Negative for confusion.    Blood pressure 124/78, pulse 100, temperature 97.2 F (36.2 C), temperature source Temporal, resp. rate 20, height 5' 5.5" (1.664 m), weight 180 lb 3.2 oz (81.738 kg).  Physical Exam Physical Exam  Constitutional: She is oriented to person, place, and time. She appears well-developed and well-nourished.  HENT:  Head: Normocephalic and atraumatic.  Eyes: EOM are normal. Pupils are equal, round, and reactive to light.  Neck: Normal range of motion. Neck supple.  Cardiovascular: Normal rate and regular rhythm.   Pulmonary/Chest: Effort normal and breath sounds normal. Right breast exhibits no inverted nipple, no mass, no nipple discharge, no skin change and no tenderness. Left breast exhibits no inverted nipple, no mass, no nipple discharge, no skin change and no tenderness. Breasts are symmetrical.    Musculoskeletal: Normal range of motion.  Lymphadenopathy:    She has no axillary adenopathy.  Neurological: She is alert and oriented to person, place, and time.  Skin: Skin is warm and dry.  Psychiatric: She has a normal mood and affect. Her behavior is normal. Judgment and thought content normal.    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 This  corresponds well with the imaging findings. The patient was  contacted by telephone, on 03/15/2012. She states the wound site  is clean and dry with no hematoma. Surgical consultation has been  scheduled with Dr. Luisa Hart on 04/07/2012.  **END ADDENDUM** SIGNED  BY: Dina L. Judyann Munson, M.D.      Study Result     *RADIOLOGY REPORT*  Clinical Data: Left breast calcifications  STEREOTACTIC-GUIDED VACUUM ASSISTED BIOPSY OF THE LEFT BREAST AND  SPECIMEN RADIOGRAPH  Comparison: Previous exams  I met with the patient and we discussed  the procedure of  stereotactic-guided biopsy, including benefits and alternatives.  We discussed the high likelihood of a successful procedure. We  discussed the risks of the procedure, including infection,  bleeding, tissue injury, clip migration, and inadequate sampling.  Informed, written consent was given.  Using sterile technique, 2% lidocaine, stereotactic guidance, and a  9 gauge vacuum assisted device, biopsy was performed of the  calcifications in the 11 o'clock position of the left breast.  Specimen radiograph was performed, showing numerous calcifications.  Specimens with calcifications are identified for pathology.  At the conclusion of the procedure, a tissue marker clip was  deployed into the biopsy cavity. Follow-up 2-view mammogram  confirmed clip to be placed in the correct position at the biopsy  site.  IMPRESSION:  Stereotactic-guided biopsy of left breast calcifications. No  apparent complications.     Assessment    Left breast atypical ductal hyperplasia    Plan    Left breast needle localized lumpectomy.The procedure has been discussed with the patient. Alternatives to surgery have been discussed with the patient.  Risks of surgery include bleeding,  Infection,  Seroma formation, death,  and the need for further surgery.   The patient understands and wishes to proceed.       Diana Tate A. 04/07/2012, 11:41 AM

## 2012-04-20 NOTE — Addendum Note (Signed)
Addendum  created 04/20/12 1129 by Kerby Nora, MD   Modules edited:Anesthesia Attestations

## 2012-04-20 NOTE — Op Note (Signed)
Left Breast partial mastectomy with needle guidence  Indications: This patient presents with history of a left breast ADH. Given the clinical history and physical exam, along with indicated diagnostic studies, breast biopsy will be performed.  Pre-operative Diagnosis: left breast ADH  Post-operative Diagnosis: left ADH  Surgeon: Field Staniszewski A.   Assistants: OR  Anesthesia: General LMA anesthesia and Local anesthesia 0.25.% bupivacaine, with epinephrine  ASA Class: 2  Procedure Details  The patient was seen in the Holding Room. The risks, benefits, complications, treatment options, and expected outcomes were discussed with the patient. The possibilities of reaction to medication, pulmonary aspiration, bleeding, infection, the need for additional procedures, failure to diagnose a condition, and creating a complication requiring transfusion or operation were discussed with the patient. The patient concurred with the proposed plan, giving informed consent. The site of surgery properly noted/marked. The patient was taken to Operating Room, identified as Diana Tate, and the procedure verified as lumpectomy. A Time Out was held and the above information confirmed.  After induction of anesthesia, the left breast and chest were prepped and draped in standard fashion. The lumpectomy was performed by creating an oblique incision over the upper inner quadrant of the breast near the wire. Hemostasis was achieved with cautery.    The margins were grossly negative.  Specimen radiograph revealed the wore and clip in the specimen.   The wound was irrigated and closed with a 3-0 Vicryl  And  4 O monocryl subcuticular closure in layers.  Specimen oriented with suture..    Sterile Dermabond was  applied. At the end of the operation, all sponge, instrument, and needle counts were correct.  Findings: grossly clear surgical margins  Estimated Blood Loss:  less than 50 mL         Drains: none           Total IV Fluids: 800 mL         Specimens: breast mass left             Complications:  None; patient tolerated the procedure well.         Disposition: PACU - hemodynamically stable.         Condition: Stable

## 2012-04-20 NOTE — Addendum Note (Signed)
Addendum  created 04/20/12 1129 by Alva Broxson A Stachia Slutsky, MD   Modules edited:Anesthesia Attestations    

## 2012-04-20 NOTE — Preoperative (Signed)
Beta Blockers   Reason not to administer Beta Blockers:Not Applicable 

## 2012-04-20 NOTE — Transfer of Care (Signed)
Immediate Anesthesia Transfer of Care Note  Patient: Diana Tate  Procedure(s) Performed: Procedure(s) (LRB) with comments: BREAST LUMPECTOMY WITH NEEDLE LOCALIZATION (Left) - Left breast Needle localization lumpectomy  Patient Location: PACU  Anesthesia Type: General  Level of Consciousness: awake, alert , oriented and patient cooperative  Airway & Oxygen Therapy: Patient Spontanous Breathing and Patient connected to nasal cannula oxygen  Post-op Assessment: Report given to PACU RN and Post -op Vital signs reviewed and stable  Post vital signs: Reviewed and stable  Complications: No apparent anesthesia complications

## 2012-04-20 NOTE — Anesthesia Preprocedure Evaluation (Addendum)
Anesthesia Evaluation  Patient identified by MRN, date of birth, ID band Patient awake    Reviewed: Allergy & Precautions, H&P , NPO status , Patient's Chart, lab work & pertinent test results  Airway Mallampati: II TM Distance: >3 FB Neck ROM: Full    Dental   Pulmonary neg pulmonary ROS, Current Smoker,  breath sounds clear to auscultation  Pulmonary exam normal       Cardiovascular Exercise Tolerance: Good Rhythm:Regular Rate:Normal     Neuro/Psych negative neurological ROS  negative psych ROS   GI/Hepatic Neg liver ROS, GERD-  Medicated and Controlled,  Endo/Other  negative endocrine ROS  Renal/GU negative Renal ROS     Musculoskeletal negative musculoskeletal ROS (+)   Abdominal   Peds  Hematology negative hematology ROS (+)   Anesthesia Other Findings   Reproductive/Obstetrics negative OB ROS                         Anesthesia Physical Anesthesia Plan  ASA: I  Anesthesia Plan: General   Post-op Pain Management:    Induction: Intravenous  Airway Management Planned: LMA  Additional Equipment:   Intra-op Plan:   Post-operative Plan: Extubation in OR  Informed Consent: I have reviewed the patients History and Physical, chart, labs and discussed the procedure including the risks, benefits and alternatives for the proposed anesthesia with the patient or authorized representative who has indicated his/her understanding and acceptance.   Dental advisory given  Plan Discussed with: CRNA, Anesthesiologist and Surgeon  Anesthesia Plan Comments:         Anesthesia Quick Evaluation

## 2012-04-20 NOTE — Interval H&P Note (Signed)
History and Physical Interval Note:  04/20/2012 9:33 AM  Diana Tate  has presented today for surgery, with the diagnosis of Left breast ADH  The various methods of treatment have been discussed with the patient and family. After consideration of risks, benefits and other options for treatment, the patient has consented to  Procedure(s) (LRB): BREAST LUMPECTOMY WITH NEEDLE LOCALIZATION (Left) as a surgical intervention .  The patient's history has been reviewed, patient examined, no change in status, stable for surgery.  I have reviewed the patient's chart and labs.  Questions were answered to the patient's satisfaction.     Cordero Surette A.

## 2012-04-20 NOTE — Interval H&P Note (Signed)
History and Physical Interval Note:  04/20/2012 9:26 AM  Diana Tate  has presented today for surgery, with the diagnosis of Left breast ADH  The various methods of treatment have been discussed with the patient and family. After consideration of risks, benefits and other options for treatment, the patient has consented to  Procedure(s) (LRB): BREAST LUMPECTOMY WITH NEEDLE LOCALIZATION (Left) as a surgical intervention .  The patient's history has been reviewed, patient examined, no change in status, stable for surgery.  I have reviewed the patient's chart and labs.  Questions were answered to the patient's satisfaction.     Staisha Winiarski A.

## 2012-04-20 NOTE — Progress Notes (Signed)
Iv intact upon arrival. Removed with cath intact. Site unremarkable

## 2012-04-22 ENCOUNTER — Telehealth (INDEPENDENT_AMBULATORY_CARE_PROVIDER_SITE_OTHER): Payer: Self-pay

## 2012-04-22 NOTE — Telephone Encounter (Signed)
I called to give patient path results of no cancer found and entire area excised. Left message to call back.

## 2012-04-29 ENCOUNTER — Encounter (INDEPENDENT_AMBULATORY_CARE_PROVIDER_SITE_OTHER): Payer: Self-pay | Admitting: Surgery

## 2012-04-29 ENCOUNTER — Ambulatory Visit (INDEPENDENT_AMBULATORY_CARE_PROVIDER_SITE_OTHER): Payer: BC Managed Care – PPO | Admitting: Surgery

## 2012-04-29 VITALS — BP 118/76 | HR 84 | Resp 16 | Ht 65.0 in | Wt 182.6 lb

## 2012-04-29 DIAGNOSIS — Z9889 Other specified postprocedural states: Secondary | ICD-10-CM

## 2012-04-29 NOTE — Progress Notes (Signed)
Diana Tate    [id number] 04/29/2012    1970/09/08   CC: Post op partial mastectomy  HPI: The patient returns for post op follow-up. She underwent a right partial mastectomy  on 04/20/2012. Over all she feels that she is doing well.   PE: VITAL SIGNS: BP 118/76  Pulse 84  Resp 16  Ht 5\' 5"  (1.651 m)  Wt 182 lb 9.6 oz (82.827 kg)  BMI 30.39 kg/m2  The incision is healing nicely and there is no evidence of infection or hematoma.   DATA REVIEWED: Pathology report showed ADH  IMPRESSION: Patient doing well.   PLAN: Her next visit will be in PRN.Refer to high risk clinic Dr Welton Flakes for opinion.

## 2012-04-29 NOTE — Patient Instructions (Signed)
Will refer to high risk breast clinic at [facility].

## 2012-04-30 ENCOUNTER — Encounter (INDEPENDENT_AMBULATORY_CARE_PROVIDER_SITE_OTHER): Payer: BC Managed Care – PPO | Admitting: Surgery

## 2012-05-10 ENCOUNTER — Other Ambulatory Visit (INDEPENDENT_AMBULATORY_CARE_PROVIDER_SITE_OTHER): Payer: Self-pay

## 2012-05-10 DIAGNOSIS — Z9889 Other specified postprocedural states: Secondary | ICD-10-CM

## 2012-05-19 ENCOUNTER — Telehealth: Payer: Self-pay | Admitting: *Deleted

## 2012-05-19 NOTE — Telephone Encounter (Signed)
Confirmed 06/15/12 High Risk appt w/ pt.  Emailed Mielissa at [facility] to make aware.  Took paperwork to [facility] for chart.

## 2012-05-21 ENCOUNTER — Telehealth: Payer: Self-pay | Admitting: Oncology

## 2012-05-21 NOTE — Telephone Encounter (Signed)
C/D 05/21/12 for appt 06/15/12

## 2012-06-13 ENCOUNTER — Ambulatory Visit (INDEPENDENT_AMBULATORY_CARE_PROVIDER_SITE_OTHER): Payer: BC Managed Care – PPO | Admitting: Internal Medicine

## 2012-06-13 VITALS — BP 112/78 | HR 99 | Temp 98.1°F | Resp 18 | Ht 65.5 in | Wt 181.4 lb

## 2012-06-13 DIAGNOSIS — B958 Unspecified staphylococcus as the cause of diseases classified elsewhere: Secondary | ICD-10-CM

## 2012-06-13 DIAGNOSIS — J340 Abscess, furuncle and carbuncle of nose: Secondary | ICD-10-CM

## 2012-06-13 DIAGNOSIS — J3489 Other specified disorders of nose and nasal sinuses: Secondary | ICD-10-CM

## 2012-06-13 DIAGNOSIS — L0202 Furuncle of face: Secondary | ICD-10-CM

## 2012-06-13 MED ORDER — DOXYCYCLINE HYCLATE 100 MG PO TABS: 100.0000 mg | ORAL_TABLET | Freq: Two times a day (BID) | ORAL | Status: DC

## 2012-06-13 MED ORDER — CEFTRIAXONE SODIUM 1 G IJ SOLR
1.0000 g | Freq: Once | INTRAMUSCULAR | Status: AC
  Administered 2012-06-13: 1 g via INTRAMUSCULAR

## 2012-06-13 NOTE — Patient Instructions (Addendum)
Abscess An abscess is an infected area that contains a collection of pus and debris. It can occur in almost any part of the body. An abscess is also known as a furuncle or boil. CAUSES   An abscess occurs when tissue gets infected. This can occur from blockage of oil or sweat glands, infection of hair follicles, or a minor injury to the skin. As the body tries to fight the infection, pus collects in the area and creates pressure under the skin. This pressure causes pain. People with weakened immune systems have difficulty fighting infections and get certain abscesses more often.   SYMPTOMS Usually an abscess develops on the skin and becomes a painful mass that is red, warm, and tender. If the abscess forms under the skin, you may feel a moveable soft area under the skin. Some abscesses break open (rupture) on their own, but most will continue to get worse without care. The infection can spread deeper into the body and eventually into the bloodstream, causing you to feel ill.   DIAGNOSIS   Your caregiver will take your medical history and perform a physical exam. A sample of fluid may also be taken from the abscess to determine what is causing your infection. TREATMENT   Your caregiver may prescribe antibiotic medicines to fight the infection. However, taking antibiotics alone usually does not cure an abscess. Your caregiver may need to make a small cut (incision) in the abscess to drain the pus. In some cases, gauze is packed into the abscess to reduce pain and to continue draining the area. HOME CARE INSTRUCTIONS    Only take over-the-counter or prescription medicines for pain, discomfort, or fever as directed by your caregiver.   If you were prescribed antibiotics, take them as directed. Finish them even if you start to feel better.   If gauze is used, follow your caregiver's directions for changing the gauze.   To avoid spreading the infection:   Keep your draining abscess covered with a  bandage.   Wash your hands well.   Do not share personal care items, towels, or whirlpools with others.   Avoid skin contact with others.   Keep your skin and clothes clean around the abscess.   Keep all follow-up appointments as directed by your caregiver.  SEEK MEDICAL CARE IF:    You have increased pain, swelling, redness, fluid drainage, or bleeding.   You have muscle aches, chills, or a general ill feeling.   You have a fever.  MAKE SURE YOU:    Understand these instructions.   Will watch your condition.   Will get help right away if you are not doing well or get worse.  Document Released: 05/14/2005 Document Revised: 02/03/2012 Document Reviewed: 10/17/2011 Exit[employer] Patient Information 2013 [employer].    

## 2012-06-13 NOTE — Progress Notes (Signed)
  Subjective:    Patient ID: Diana Tate, female    DOB: 11-16-1970, 41 y.o.   MRN: [id number]  HPI 2nd attack nasal swellin reddness and pain with lesion inside right nostril. No fever, but very painful.   Review of Systems     Objective:   Physical Exam Right nasal os red with local sore c and s done       Assessment & Plan:  Furuncle nose with pain Rocephin/doxy/heat

## 2012-06-15 ENCOUNTER — Encounter: Payer: BC Managed Care – PPO | Admitting: Oncology

## 2012-06-15 ENCOUNTER — Telehealth: Payer: Self-pay | Admitting: *Deleted

## 2012-06-15 ENCOUNTER — Ambulatory Visit: Payer: BC Managed Care – PPO

## 2012-06-15 NOTE — Telephone Encounter (Signed)
Pt called - very sick and cannot make appt today.  Rescheduled and confirmed 06/29/12 appt w/ pt.

## 2012-06-17 LAB — WOUND CULTURE: Gram Stain: NONE SEEN

## 2012-06-23 ENCOUNTER — Telehealth: Payer: Self-pay | Admitting: Oncology

## 2012-06-23 NOTE — Telephone Encounter (Signed)
LVOM for pt to return call in re to appt time change to 1:30.  Calendar mailed out.

## 2012-06-29 ENCOUNTER — Telehealth: Payer: Self-pay | Admitting: Oncology

## 2012-06-29 ENCOUNTER — Ambulatory Visit (HOSPITAL_BASED_OUTPATIENT_CLINIC_OR_DEPARTMENT_OTHER): Payer: BC Managed Care – PPO | Admitting: Oncology

## 2012-06-29 ENCOUNTER — Encounter: Payer: Self-pay | Admitting: Oncology

## 2012-06-29 ENCOUNTER — Ambulatory Visit: Payer: BC Managed Care – PPO | Admitting: Lab

## 2012-06-29 ENCOUNTER — Ambulatory Visit: Payer: BC Managed Care – PPO

## 2012-06-29 VITALS — BP 126/80 | HR 108 | Temp 98.2°F | Resp 20 | Ht 65.5 in | Wt 182.4 lb

## 2012-06-29 DIAGNOSIS — N6089 Other benign mammary dysplasias of unspecified breast: Secondary | ICD-10-CM

## 2012-06-29 DIAGNOSIS — Z808 Family history of malignant neoplasm of other organs or systems: Secondary | ICD-10-CM

## 2012-06-29 DIAGNOSIS — F17200 Nicotine dependence, unspecified, uncomplicated: Secondary | ICD-10-CM

## 2012-06-29 DIAGNOSIS — N6099 Unspecified benign mammary dysplasia of unspecified breast: Secondary | ICD-10-CM

## 2012-06-29 MED ORDER — TAMOXIFEN CITRATE 20 MG PO TABS: 20.0000 mg | ORAL_TABLET | Freq: Every day | ORAL | Status: DC

## 2012-06-29 NOTE — Telephone Encounter (Signed)
gve the pt her feb 2014 appt calendar. Pt decided to get her labs drawn on weds. Pt could not stay today

## 2012-06-29 NOTE — Progress Notes (Signed)
[facility]  High Risk Clinic New Patient Evaluation  Name: Diana Tate            Date: 06/29/2012 MRN: [id number]                DOB: 02-18-1971  CC:   Dr. Lilyan Punt  Dr. Harriette Bouillon  REFERRING PHYSICIAN: Dr. 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 Patient is seen in the high-risk clinic for discussion of risk reduction for future breast cancers.  HISTORY OF PRESENT ILLNESS: Diana Tate is a 41 y.o. female here for initial office visit. She recently had screening mammogram performed that showed an abnormality in the left breast. Because of this she went on to have a needle core biopsy that showed atypical ductal hyperplasia. With calcifications in the left breast at the 11:00 position. She was subsequently referred to Dr. Harriette Bouillon for an excisional lumpectomy. 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 There was no evidence of invasive malignancy. In addition there was noted to be an incidental radial scar. She is doing well postoperatively. She is seen in the high-risk clinic for discussion of risk reduction for future breast cancers.  PAST MEDICAL HISTORY:  has a past medical history of GERD (gastroesophageal reflux disease) and Hyperlipidemia.  PAST SURGICAL HISTORY:  Past Surgical History  Procedure Date  . Cholecystectomy   . Tonsillectomy 2000  . Endometrial ablation 2007  . Breast surgery       CURRENT MEDICATIONS: Ms. Joanne Gavel had no medications administered during this visit.  ALLERGIES: Review of patient's allergies indicates no known allergies.  SOCIAL HISTORY:  reports that she has quit smoking. Her smoking use included Cigarettes. She has a 24 pack-year smoking history. She has never used smokeless tobacco. She reports that she does not drink  alcohol or use illicit drugs.  HEALTH HABITS: Vitamins: none Supplements:none Alternative Therapies: none Adverse environmental exposure: none Servings of fruit and vegetables/day:3-4 servings of vegetable Servings of meat/day: 2 servings Exercises regularly: 3-4 hours a week                 Smoker/nonsmoker: former smoking Alcohol: very rare Number of alcoholic beverages/week:rare  REPRODUCTIVE HISTORY:  Menarche age: 55 Gravida:  2     Para:2 (boys) First Live Birth: 21 Number of live births:2 Breast fed: Y for a few weeks Took fertility meds: none                    Menses: none endometrial ablation Oral Contraceptives:     # of years Menopause: natural/surgical  Yes for 46 - 2008 Age HRT Y/N Currently Y/N TType:          none                         # years Sexually transmitted disease:  none  FAMILY HISTORY:  family history includes Cancer in her maternal grandfather and maternal uncle.  maternal GF skin and leukemia , maternal uncle lung cancer, paternal GF prostate cancer, paternal aunt with colon cancer  HEALTH MAINTENANCE: Last mammogram: august 2013 Last clinical breast exam: august2013 Performs self breast exam: yes Last Pap Smear: June 2013 Colonoscopy:  none Last skin exam: yes  REVIEW OF SYSTEMS:  General: Negative for fever, chills, night sweats,  loss of appetite  or weight loss. HEENT: Negative for headaches, sore  throat, difficulty swallowing, blurred vision or problem with hearing or  sinus congestion. Respiratory: Negative for shortness of breath, cough  or dyspnea on exertion. Cardiovascular: Negative for chest pain,  palpitations or pedal edema. GI: Negative for nausea, vomiting,  diarrhea, constipation, change in bowel habits or blood in the stool.  No jaundice. GU: Negative for painful or frequent urination, change in  color of urine, or decreased urinary stream. Integumentary: Negative  for skin rashes or other suspicious skin lesions.  Hematologic: Negative  for easy bruisability or bleeding. Musculoskeletal: Negative for  complaints of pain, arthralgias, arthritis or myalgias.  Neurological/psychiatric: Negative for numbness, focal weakness,  balance problems or coordination difficulties. No depression or mood swings.  Breast: No self detected abnormalities in the breast. No nipple discharge, masses or redness of the skin.   PHYSICAL EXAM: BP 126/80  Pulse 108  Temp 98.2 F (36.8 C)  Resp 20  Ht 5' 5.5" (1.664 m)  Wt 182 lb 6.4 oz (82.736 kg)  BMI 29.89 kg/m2 GENERAL: Well developed, well nourished, in no acute distress.  EENT: No ocular or oral lesions. No stomatitis.  RESPIRATORY: Lungs are clear to auscultation bilaterally with normal respiratory movement and no accessory muscle use. CARDIAC: No murmur, rub or tachycardia. No upper or lower extremity edema.  GI: Abdomen is soft, no palpable hepatosplenomegaly. No fluid wave. No tenderness. Musculoskeletal: No kyphosis, no tenderness over the spine, ribs or hips. Lymph: No cervical, infraclavicular, axillary or inguinal adenopathy. Neuro: No focal neurological deficits. Psych: Alert and oriented X 3, appropriate mood and affect.  BREAST EXAM: In the supine position, with the right arm over the head, right nipple is everted. No periareolar edema or nipple discharge. No mass in any quadrant or subareolar region. No redness of the skin. No right axillary adenopathy. With the left arm over the head, left nipple is everted. No periareolar edema or nipple discharge. No mass in any quadrant or subareolar region. Well-healed incisional scar No redness of the skin. No left axillary adenopathy.    ASSESSMENT: 41 year old female with  #1 on a screening mammogram find to have abnormalities in the left breast core needle biopsy revealing atypical ductal hyperplasia. She has gone on to have excisional lumpectomy in September 2013. She was noted to have a radial scar but no other  evidence of malignancy. Postoperatively she is doing well. She is seen today for discussion of future breast cancer and risk reduction.  #2 patient and I discussed use of tamoxifen as a chemopreventive for future breast cancer reduction risks and benefits of tamoxifen were discussed with her in detail.  #3 we also discussed familial risk of breast cancer as well as hereditary risk for developing breast cancer. I reviewed her family history. I do think the patient should be seen by genetic counselor since there is noted history of malignancies in the patient's family as noted above.  #4 patient and I discussed lifestyle modification to help reduce her risk of malignancies. We discussed diet exercise smoking cessation reduction in alcohol consumption. We discussed self breast examinations as well as clinical breast exams. She should continue having her yearly mammograms performed.    PLAN:  #1 patient will proceed with tamoxifen 20 mg daily total of 5 years of therapy will be planned.  #2 she will begin a good exercise and diet regimen.  #3 we will refer her to genetic counseling.  The length of time of the face-to-face  encounter was 60    minutes. More than 50% of time was spent counseling and coordination of care.  Drue Second, MD Medical/Oncology [facility] [phone] (beeper) [phone] (Office)

## 2012-06-29 NOTE — Progress Notes (Signed)
Checked in new pt with no financial concerns. °

## 2012-06-29 NOTE — Patient Instructions (Addendum)
We discussed your diagnosis and pathology of atypical ductal hyperplasia  We discussed tamoxifen as prevention for breast cancer in the future  Tamoxifen oral tablet What is this medicine? TAMOXIFEN (ta MOX i fen) blocks the effects of estrogen. It is commonly used to treat breast cancer. It is also used to decrease the chance of breast cancer coming back in women who have received treatment for the disease. It may also help prevent breast cancer in women who have a high risk of developing breast cancer. This medicine may be used for other purposes; ask your health care provider or pharmacist if you have questions. What should I tell my health care provider before I take this medicine? They need to know if you have any of these conditions: -blood clots -blood disease -cataracts or impaired eyesight -endometriosis -high calcium levels -high cholesterol -irregular menstrual cycles -liver disease -stroke -uterine fibroids -an unusual or allergic reaction to tamoxifen, other medicines, foods, dyes, or preservatives -pregnant or trying to get pregnant -breast-feeding How should I use this medicine? Take this medicine by mouth with a glass of water. Follow the directions on the prescription label. You can take it with or without food. Take your medicine at regular intervals. Do not take your medicine more often than directed. Do not stop taking except on your doctor's advice. A special MedGuide will be given to you by the pharmacist with each prescription and refill. Be sure to read this information carefully each time. Talk to your pediatrician regarding the use of this medicine in children. While this drug may be prescribed for selected conditions, precautions do apply. Overdosage: If you think you have taken too much of this medicine contact a poison control center or emergency room at once. NOTE: This medicine is only for you. Do not share this medicine with others. What if I miss a  dose? If you miss a dose, take it as soon as you can. If it is almost time for your next dose, take only that dose. Do not take double or extra doses. What may interact with this medicine? -aminoglutethimide -bromocriptine -chemotherapy drugs -female hormones, like estrogens and birth control pills -letrozole -medroxyprogesterone -phenobarbital -rifampin -warfarin This list may not describe all possible interactions. Give your health care provider a list of all the medicines, herbs, non-prescription drugs, or dietary supplements you use. Also tell them if you smoke, drink alcohol, or use illegal drugs. Some items may interact with your medicine. What should I watch for while using this medicine? Visit your doctor or health care professional for regular checks on your progress. You will need regular pelvic exams, breast exams, and mammograms. If you are taking this medicine to reduce your risk of getting breast cancer, you should know that this medicine does not prevent all types of breast cancer. If breast cancer or other problems occur, there is no guarantee that it will be found at an early stage. Do not become pregnant while taking this medicine or for 2 months after stopping this medicine. Stop taking this medicine if you get pregnant or think you are pregnant and contact your doctor. This medicine may harm your unborn baby. Women who can possibly become pregnant should use birth control methods that do not use hormones during tamoxifen treatment and for 2 months after therapy has stopped. Talk with your health care provider for birth control advice. Do not breast feed while taking this medicine. What side effects may I notice from receiving this medicine? Side effects that you  should report to your doctor or health care professional as soon as possible: -changes in vision (blurred vision) -changes in your menstrual cycle -difficulty breathing or shortness of breath -difficulty walking or  talking -new breast lumps -numbness -pelvic pain or pressure -redness, blistering, peeling or loosening of the skin, including inside the mouth -skin rash or itching (hives) -sudden chest pain -swelling of lips, face, or tongue -swelling, pain or tenderness in your calf or leg -unusual bruising or bleeding -vaginal discharge that is bloody, brown, or rust -weakness -yellowing of the whites of the eyes or skin Side effects that usually do not require medical attention (report to your doctor or health care professional if they continue or are bothersome): -fatigue -hair loss, although uncommon and is usually mild -headache -hot flashes -impotence (in men) -nausea, vomiting (mild) -vaginal discharge (white or clear) This list may not describe all possible side effects. Call your doctor for medical advice about side effects. You may report side effects to FDA at 1-800-FDA-1088. Where should I keep my medicine? Keep out of the reach of children. Store at room temperature between 20 and 25 degrees C (68 and 77 degrees F). Protect from light. Keep container tightly closed. Throw away any unused medicine after the expiration date. NOTE: This sheet is a summary. It may not cover all possible information. If you have questions about this medicine, talk to your doctor, pharmacist, or health care provider.  2012, [employer]vier/[employer] Standard. (04/20/2008 12:01:56 PM)

## 2012-06-30 ENCOUNTER — Other Ambulatory Visit (HOSPITAL_BASED_OUTPATIENT_CLINIC_OR_DEPARTMENT_OTHER): Payer: BC Managed Care – PPO | Admitting: Lab

## 2012-06-30 DIAGNOSIS — E785 Hyperlipidemia, unspecified: Secondary | ICD-10-CM

## 2012-06-30 LAB — COMPREHENSIVE METABOLIC PANEL (CC13)
ALT: 12 U/L (ref 0–55)
BUN: 10 mg/dL (ref 7.0–26.0)
CO2: 25 mEq/L (ref 22–29)
Creatinine: 0.7 mg/dL (ref 0.6–1.1)
Glucose: 91 mg/dl (ref 70–99)
Total Bilirubin: 0.2 mg/dL (ref 0.20–1.20)

## 2012-06-30 LAB — CBC WITH DIFFERENTIAL/PLATELET
BASO%: 0.4 % (ref 0.0–2.0)
HCT: 38.3 % (ref 34.8–46.6)
LYMPH%: 24.5 % (ref 14.0–49.7)
MCH: 28.6 pg (ref 25.1–34.0)
MCHC: 33.1 g/dL (ref 31.5–36.0)
MONO#: 1.2 10*3/uL — ABNORMAL HIGH (ref 0.1–0.9)
NEUT%: 63.8 % (ref 38.4–76.8)
Platelets: 250 10*3/uL (ref 145–400)
WBC: 11.5 10*3/uL — ABNORMAL HIGH (ref 3.9–10.3)

## 2012-09-29 ENCOUNTER — Ambulatory Visit: Payer: BC Managed Care – PPO | Admitting: Oncology

## 2012-09-29 ENCOUNTER — Other Ambulatory Visit: Payer: BC Managed Care – PPO | Admitting: Lab

## 2012-10-05 ENCOUNTER — Other Ambulatory Visit: Payer: Self-pay | Admitting: Medical Oncology

## 2012-10-05 DIAGNOSIS — N6099 Unspecified benign mammary dysplasia of unspecified breast: Secondary | ICD-10-CM

## 2012-10-06 ENCOUNTER — Other Ambulatory Visit: Payer: BC Managed Care – PPO | Admitting: Lab

## 2012-10-06 ENCOUNTER — Ambulatory Visit: Payer: BC Managed Care – PPO | Admitting: Oncology

## 2012-10-29 ENCOUNTER — Telehealth: Payer: Self-pay | Admitting: *Deleted

## 2012-10-29 NOTE — Telephone Encounter (Signed)
Confirmed 12/29/12 appt w/ pt.

## 2012-12-29 ENCOUNTER — Other Ambulatory Visit (HOSPITAL_BASED_OUTPATIENT_CLINIC_OR_DEPARTMENT_OTHER): Payer: BC Managed Care – PPO | Admitting: Lab

## 2012-12-29 ENCOUNTER — Ambulatory Visit (HOSPITAL_BASED_OUTPATIENT_CLINIC_OR_DEPARTMENT_OTHER): Payer: BC Managed Care – PPO | Admitting: Oncology

## 2012-12-29 ENCOUNTER — Encounter: Payer: Self-pay | Admitting: Oncology

## 2012-12-29 VITALS — BP 105/67 | HR 73 | Temp 98.6°F | Resp 20 | Ht 65.5 in | Wt 194.6 lb

## 2012-12-29 DIAGNOSIS — N6089 Other benign mammary dysplasias of unspecified breast: Secondary | ICD-10-CM

## 2012-12-29 DIAGNOSIS — N6099 Unspecified benign mammary dysplasia of unspecified breast: Secondary | ICD-10-CM

## 2012-12-29 LAB — CBC WITH DIFFERENTIAL/PLATELET
Basophils Absolute: 0 10*3/uL (ref 0.0–0.1)
Eosinophils Absolute: 0.3 10*3/uL (ref 0.0–0.5)
HCT: 37.7 % (ref 34.8–46.6)
HGB: 12.3 g/dL (ref 11.6–15.9)
LYMPH%: 29.4 % (ref 14.0–49.7)
MONO#: 0.9 10*3/uL (ref 0.1–0.9)
NEUT#: 6.2 10*3/uL (ref 1.5–6.5)
NEUT%: 58.6 % (ref 38.4–76.8)
Platelets: 268 10*3/uL (ref 145–400)
WBC: 10.6 10*3/uL — ABNORMAL HIGH (ref 3.9–10.3)

## 2012-12-29 LAB — COMPREHENSIVE METABOLIC PANEL (CC13)
CO2: 24 mEq/L (ref 22–29)
Calcium: 9.6 mg/dL (ref 8.4–10.4)
Chloride: 104 mEq/L (ref 98–107)
Creatinine: 0.8 mg/dL (ref 0.6–1.1)
Glucose: 123 mg/dl — ABNORMAL HIGH (ref 70–99)
Total Bilirubin: 0.29 mg/dL (ref 0.20–1.20)

## 2012-12-29 NOTE — Progress Notes (Signed)
OFFICE PROGRESS NOTE  CCLilyan Punt, MD 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 Patient was seen in the high-risk clinic on 06/29/2012.  PRIOR THERAPY:  #1 patient underwent an excisional lumpectomy by Dr. Harriette Bouillon on 04/20/2012 and the final pathology was confirmed to be atypical ductal hyperplasia.  #2 she was seen in the high-risk clinic. We discussed risk reduction. She was begun on tamoxifen. However patient has decided not to take the tamoxifen.  CURRENT THERAPY: Observation  INTERVAL HISTORY: Diana Tate 42 y.o. female returns for followup visit today. She informs me that the prescription that I gave her for tamoxifen she did not take. Primary reason she was worried about the side effects we discussed this again. She feels very comfortable with her decision. She is only concerned about her weight gain that she's been having since she turns 39. She is exercising she is watching her diet but in spite of that she is gaining weight. I recommend that she speak with Dr.-looking regarding this as well as gave her information on exercise and diet.  MEDICAL HISTORY: Past Medical History  Diagnosis Date  . GERD (gastroesophageal reflux disease)   . Hyperlipidemia     ALLERGIES:  has No Known Allergies.  MEDICATIONS:  Current Outpatient Prescriptions  Medication Sig Dispense Refill  . esomeprazole (NEXIUM) 20 MG capsule Take 20 mg by mouth daily as needed. For acid reflux       No current facility-administered medications for this visit.    SURGICAL HISTORY:  Past Surgical History  Procedure Laterality Date  . Cholecystectomy    . Tonsillectomy  2000  . Endometrial ablation  2007  . Breast surgery      REVIEW OF SYSTEMS:  Pertinent items are noted in HPI.   HEALTH MAINTENANCE:  PHYSICAL EXAMINATION: Blood pressure 105/67, pulse 73, temperature 98.6  F (37 C), temperature source Oral, resp. rate 20, height 5' 5.5" (1.664 m), weight 194 lb 9.6 oz (88.27 kg). Body mass index is 31.88 kg/(m^2). ECOG PERFORMANCE STATUS: 0 - Asymptomatic   Well-developed well-nourished female in no acute distress Lungs clear Cardiovascular regular rate rhythm Breast exam left breast healed surgical scar right breast no masses or nipple discharge   LABORATORY DATA: Lab Results  Component Value Date   WBC 10.6* 12/29/2012   HGB 12.3 12/29/2012   HCT 37.7 12/29/2012   MCV 84.3 12/29/2012   PLT 268 12/29/2012      Chemistry      Component Value Date/Time   NA 137 12/29/2012 1529   NA 136 04/13/2012 0833   K 3.8 12/29/2012 1529   K 4.0 04/13/2012 0833   CL 104 12/29/2012 1529   CL 102 04/13/2012 0833   CO2 24 12/29/2012 1529   CO2 25 04/13/2012 0833   BUN 13.2 12/29/2012 1529   BUN 8 04/13/2012 0833   CREATININE 0.8 12/29/2012 1529   CREATININE 0.70 04/13/2012 0833      Component Value Date/Time   CALCIUM 9.6 12/29/2012 1529   CALCIUM 9.8 04/13/2012 0833   ALKPHOS 75 12/29/2012 1529   ALKPHOS 83 04/13/2012 0833   AST 11 12/29/2012 1529   AST 12 04/13/2012 0833   ALT 17 12/29/2012 1529   ALT 12 04/13/2012 0833   BILITOT 0.29 12/29/2012 1529   BILITOT 0.3 04/13/2012 0833       RADIOGRAPHIC STUDIES:  No results found.  ASSESSMENT: 42 year old female with  #  1 atypical ductal hyperplasia. She is status post excisional lumpectomy. Now on observation. She declines tamoxifen.  #2 I recommended she continue getting her mammograms self breast examinations as well as clinical breast examinations.  #3 she will exercise and diet information was given to her she certainly should be seen by a nutritionist.   PLAN:   #1 I will see her on an as-needed basis.   All questions were answered. The patient knows to call the clinic with any problems, questions or concerns. We can certainly see the patient much sooner if necessary.  I spent 20 minutes counseling the  patient face to face. The total time spent in the appointment was 30 minutes.    Drue Second, MD Medical/Oncology [facility] [phone] (beeper) [phone] (Office)  12/29/2012, 5:18 PM

## 2012-12-29 NOTE — Patient Instructions (Addendum)
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  #2 I will see you as needed. We discussed the need for continuing self breast examination having clinical exams done by your gynecologist as well as continuing to get mammograms.  #3 we discussed diet and exercise.  Exercise to Lose Weight Exercise and a healthy diet may help you lose weight. Your doctor may suggest specific exercises. EXERCISE IDEAS AND TIPS  Choose low-cost things you enjoy doing, such as walking, bicycling, or exercising to workout videos.  Take stairs instead of the elevator.  Walk during your lunch break.  Park your car further away from work or school.  Go to a gym or an exercise class.  Start with 5 to 10 minutes of exercise each day. Build up to 30 minutes of exercise 4 to 6 days a week.  Wear shoes with good support and comfortable clothes.  Stretch before and after working out.  Work out until you breathe harder and your heart beats faster.  Drink extra water when you exercise.  Do not do so much that you hurt yourself, feel dizzy, or get very short of breath. Exercises that burn about 150 calories:  Running 1  miles in 15 minutes.  Playing volleyball for 45 to 60 minutes.  Washing and waxing a car for 45 to 60 minutes.  Playing touch football for 45 minutes.  Walking 1  miles in 35 minutes.  Pushing a stroller 1  miles in 30 minutes.  Playing basketball for 30 minutes.  Raking leaves for 30 minutes.  Bicycling 5 miles in 30 minutes.  Walking 2 miles in 30 minutes.  Dancing for 30 minutes.  Shoveling snow for 15 minutes.  Swimming laps for 20 minutes.  Walking up stairs for 15 minutes.  Bicycling 4 miles in 15 minutes.  Gardening for 30 to 45 minutes.  Jumping rope for 15 minutes.  Washing windows or floors for 45 to 60 minutes. Document Released: 09/06/2010 Document Revised: 10/27/2011 Document Reviewed:  09/06/2010 [facility] Patient Information 2013 [place], [place].   Fat and Cholesterol Control Diet Cholesterol levels in your body are determined significantly by your diet. Cholesterol levels may also be related to heart disease. The following material helps to explain this relationship and discusses what you can do to help keep your heart healthy. Not all cholesterol is bad. Low-density lipoprotein (LDL) cholesterol is the "bad" cholesterol. It may cause fatty deposits to build up inside your arteries. High-density lipoprotein (HDL) cholesterol is "good." It helps to remove the "bad" LDL cholesterol from your blood. Cholesterol is a very important risk factor for heart disease. Other risk factors are high blood pressure, smoking, stress, heredity, and weight. The heart muscle gets its supply of blood through the coronary arteries. If your LDL cholesterol is high and your HDL cholesterol is low, you are at risk for having fatty deposits build up in your coronary arteries. This leaves less room through which blood can flow. Without sufficient blood and oxygen, the heart muscle cannot function properly and you may feel chest pains (angina pectoris). When a coronary artery closes up entirely, a part of the heart muscle may die causing a heart attack (myocardial infarction). CHECKING CHOLESTEROL When your caregiver sends your blood to a lab to be examined for cholesterol, a complete lipid (fat) profile may be done. With this test, the total amount of cholesterol and levels of LDL and HDL are determined. Triglycerides are a type of fat  that circulates in the blood. They can also be used to determine heart disease risk. The list below describes what the numbers should be: Test: Total Cholesterol.  Less than 200 mg/dl. Test: LDL "bad cholesterol."  Less than 100 mg/dl.  Less than 70 mg/dl if you are at very high risk of a heart attack or sudden cardiac death. Test: HDL "good cholesterol."  Greater than 50  mg/dl for women.  Greater than 40 mg/dl for men. Test: Triglycerides.  Less than 150 mg/dl. CONTROLLING CHOLESTEROL WITH DIET Although exercise and lifestyle factors are important, your diet is key. That is because certain foods are known to raise cholesterol and others to lower it. The goal is to balance foods for their effect on cholesterol and more importantly, to replace saturated and trans fat with other types of fat, such as monounsaturated fat, polyunsaturated fat, and omega-3 fatty acids. On average, a person should consume no more than 15 to 17 g of saturated fat daily. Saturated and trans fats are considered "bad" fats, and they will raise LDL cholesterol. Saturated fats are primarily found in animal products such as meats, butter, and cream. However, that does not mean you need to give up all your favorite foods. Today, there are good tasting, low-fat, low-cholesterol substitutes for most of the things you like to eat. Choose low-fat or nonfat alternatives. Choose round or loin cuts of red meat. These types of cuts are lowest in fat and cholesterol. Chicken (without the skin), fish, veal, and ground [place] breast are great choices. Eliminate fatty meats, such as hot dogs and salami. Even shellfish have little or no saturated fat. Have a 3 oz (85 g) portion when you eat lean meat, poultry, or fish. Trans fats are also called "partially hydrogenated oils." They are oils that have been scientifically manipulated so that they are solid at room temperature resulting in a longer shelf life and improved taste and texture of foods in which they are added. Trans fats are found in stick margarine, some tub margarines, cookies, crackers, and baked goods.  When baking and cooking, oils are a great substitute for butter. The monounsaturated oils are especially beneficial since it is believed they lower LDL and raise HDL. The oils you should avoid entirely are saturated tropical oils, such as coconut and  palm.  Remember to eat a lot from food groups that are naturally free of saturated and trans fat, including fish, fruit, vegetables, beans, grains (barley, rice, couscous, bulgur wheat), and pasta (without cream sauces).  IDENTIFYING FOODS THAT LOWER CHOLESTEROL  Soluble fiber may lower your cholesterol. This type of fiber is found in fruits such as apples, vegetables such as broccoli, potatoes, and carrots, legumes such as beans, peas, and lentils, and grains such as barley. Foods fortified with plant sterols (phytosterol) may also lower cholesterol. You should eat at least 2 g per day of these foods for a cholesterol lowering effect.  Read package labels to identify low-saturated fats, trans fat free, and low-fat foods at the supermarket. Select cheeses that have only 2 to 3 g saturated fat per ounce. Use a heart-healthy tub margarine that is free of trans fats or partially hydrogenated oil. When buying baked goods (cookies, crackers), avoid partially hydrogenated oils. Breads and muffins should be made from whole grains (whole-wheat or whole oat flour, instead of "flour" or "enriched flour"). Buy non-creamy canned soups with reduced salt and no added fats.  FOOD PREPARATION TECHNIQUES  Never deep-fry. If you must fry, either stir-fry,  which uses very little fat, or use non-stick cooking sprays. When possible, broil, bake, or roast meats, and steam vegetables. Instead of putting butter or margarine on vegetables, use lemon and herbs, applesauce, and cinnamon (for squash and sweet potatoes), nonfat yogurt, salsa, and low-fat dressings for salads.  LOW-SATURATED FAT / LOW-FAT FOOD SUBSTITUTES Meats / Saturated Fat (g)  Avoid: Steak, marbled (3 oz/85 g) / 11 g  Choose: Steak, lean (3 oz/85 g) / 4 g  Avoid: Hamburger (3 oz/85 g) / 7 g  Choose: Hamburger, lean (3 oz/85 g) / 5 g  Avoid: Ham (3 oz/85 g) / 6 g  Choose: Ham, lean cut (3 oz/85 g) / 2.4 g  Avoid: Chicken, with skin, dark meat (3 oz/85  g) / 4 g  Choose: Chicken, skin removed, dark meat (3 oz/85 g) / 2 g  Avoid: Chicken, with skin, light meat (3 oz/85 g) / 2.5 g  Choose: Chicken, skin removed, light meat (3 oz/85 g) / 1 g Dairy / Saturated Fat (g)  Avoid: Whole milk (1 cup) / 5 g  Choose: Low-fat milk, 2% (1 cup) / 3 g  Choose: Low-fat milk, 1% (1 cup) / 1.5 g  Choose: Skim milk (1 cup) / 0.3 g  Avoid: Hard cheese (1 oz/28 g) / 6 g  Choose: Skim milk cheese (1 oz/28 g) / 2 to 3 g  Avoid: Cottage cheese, 4% fat (1 cup) / 6.5 g  Choose: Low-fat cottage cheese, 1% fat (1 cup) / 1.5 g  Avoid: Ice cream (1 cup) / 9 g  Choose: Sherbet (1 cup) / 2.5 g  Choose: Nonfat frozen yogurt (1 cup) / 0.3 g  Choose: Frozen fruit bar / trace  Avoid: Whipped cream (1 tbs) / 3.5 g  Choose: Nondairy whipped topping (1 tbs) / 1 g Condiments / Saturated Fat (g)  Avoid: Mayonnaise (1 tbs) / 2 g  Choose: Low-fat mayonnaise (1 tbs) / 1 g  Avoid: Butter (1 tbs) / 7 g  Choose: Extra light margarine (1 tbs) / 1 g  Avoid: Coconut oil (1 tbs) / 11.8 g  Choose: Olive oil (1 tbs) / 1.8 g  Choose: Corn oil (1 tbs) / 1.7 g  Choose: Safflower oil (1 tbs) / 1.2 g  Choose: Sunflower oil (1 tbs) / 1.4 g  Choose: Soybean oil (1 tbs) / 2.4 g  Choose: Canola oil (1 tbs) / 1 g Document Released: 08/04/2005 Document Revised: 10/27/2011 Document Reviewed: 01/23/2011 [facility] Patient Information 2013 [place], [place].

## 2013-01-04 ENCOUNTER — Other Ambulatory Visit: Payer: Self-pay | Admitting: Obstetrics and Gynecology

## 2013-01-04 DIAGNOSIS — Z853 Personal history of malignant neoplasm of breast: Secondary | ICD-10-CM

## 2013-01-14 ENCOUNTER — Telehealth: Payer: Self-pay | Admitting: *Deleted

## 2013-01-14 NOTE — Telephone Encounter (Signed)
Patient called wanted an appt today for depression that has been going for awhile but stated today was her breaking point. She stated she was not suicidal. Consult with Dr. Brett Canales. Patient advised to go directly to Emergency room.

## 2013-02-25 ENCOUNTER — Other Ambulatory Visit: Payer: Self-pay | Admitting: Obstetrics and Gynecology

## 2013-02-25 DIAGNOSIS — Z1231 Encounter for screening mammogram for malignant neoplasm of breast: Secondary | ICD-10-CM

## 2013-03-04 ENCOUNTER — Ambulatory Visit: Payer: BC Managed Care – PPO

## 2013-03-18 ENCOUNTER — Ambulatory Visit
Admission: RE | Admit: 2013-03-18 | Discharge: 2013-03-18 | Disposition: A | Discharge status: Home / Self Care | Payer: 59 | Source: Ambulatory Visit | Attending: Obstetrics and Gynecology | Admitting: Obstetrics and Gynecology

## 2013-03-18 DIAGNOSIS — Z1231 Encounter for screening mammogram for malignant neoplasm of breast: Secondary | ICD-10-CM

## 2013-03-18 IMAGING — MG STANDARD SCREENING - COMBO
6 of 9 series · 6 of 25 positions shown · non-contrast
Comparison: none

CLINICAL DATA: Screening.

DIGITAL SCREENING BILATERAL MAMMOGRAM WITH CAD
DIGITAL BREAST TOMOSYNTHESIS
Digital breast tomosynthesis images are acquired in two
projections.  These images are reviewed in combination with the
digital mammogram, confirming the findings below.

[R MLO (1 of 2)]
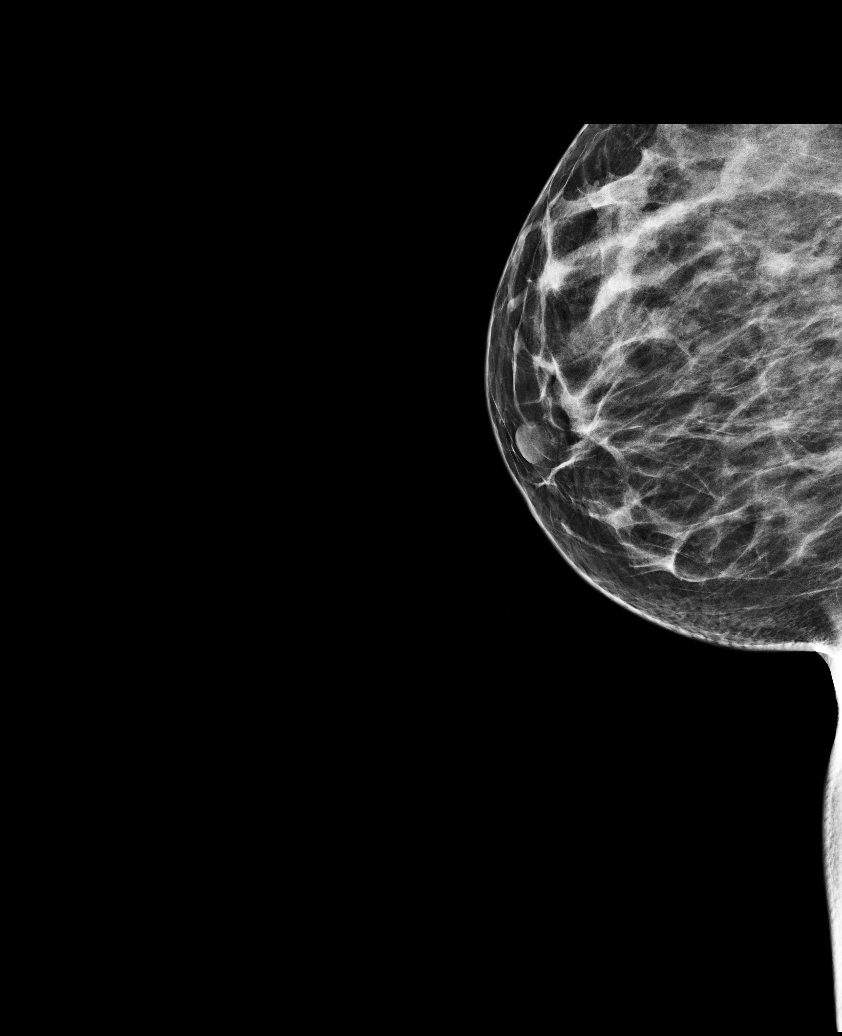

[L MLO]
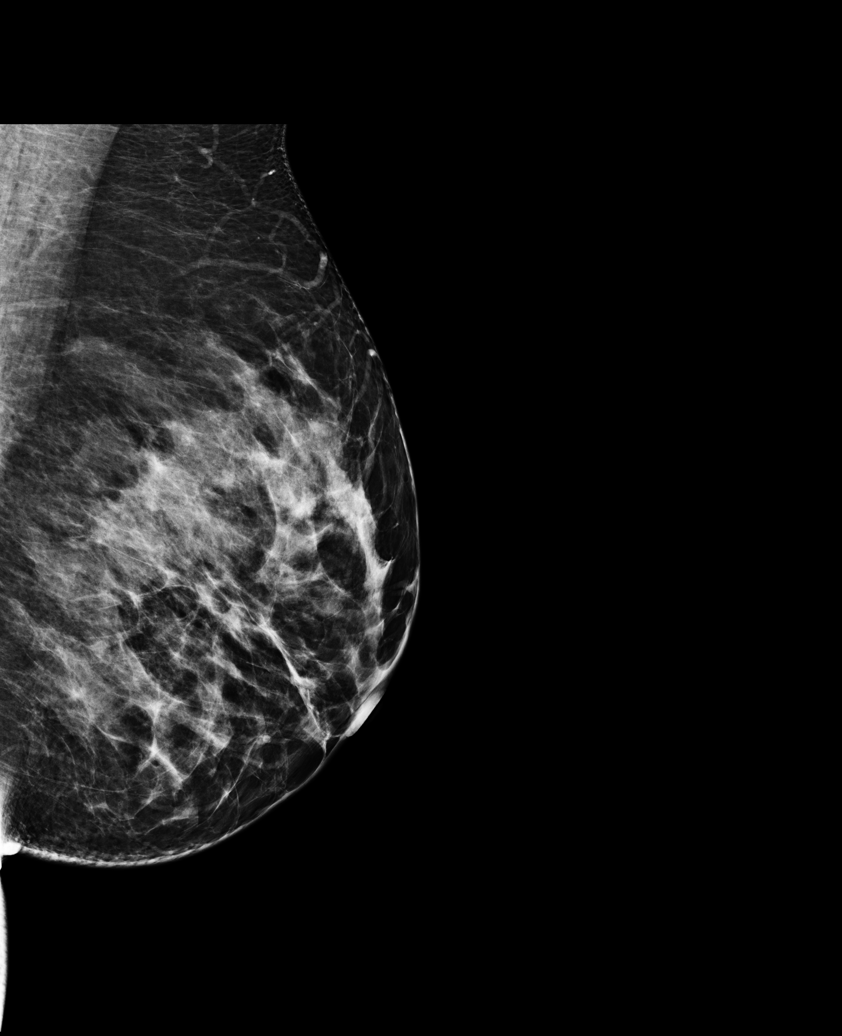

[R CC]
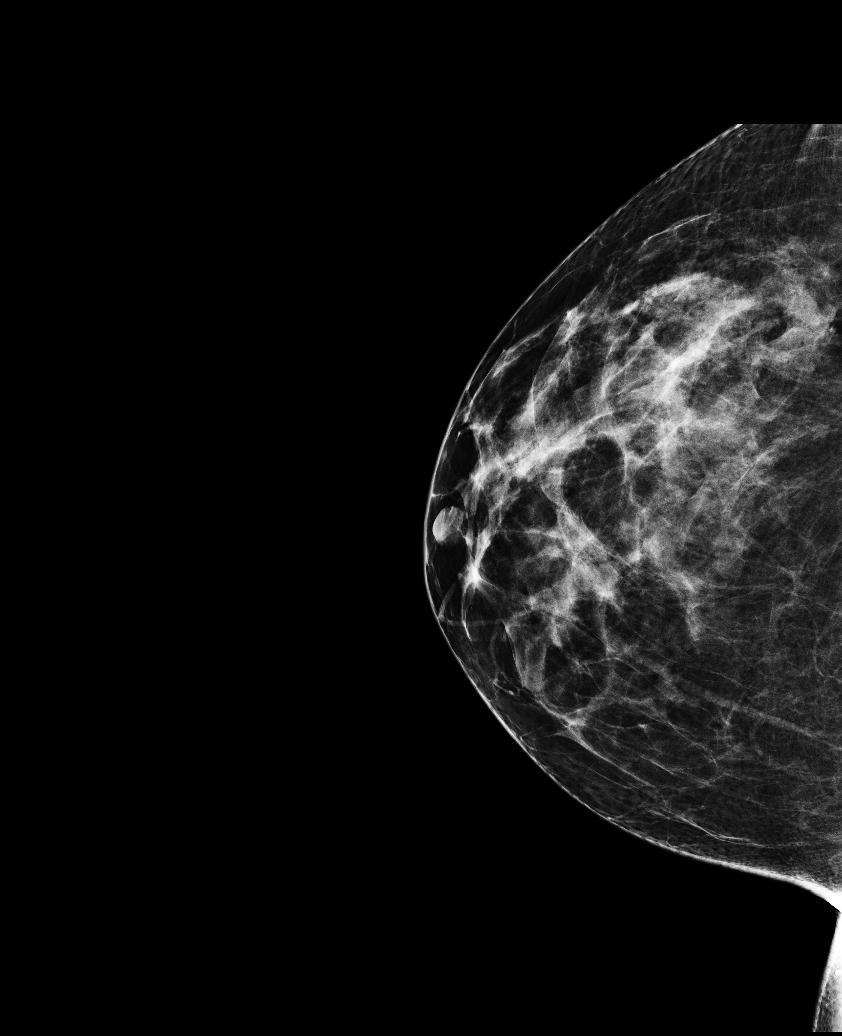

[L CC]
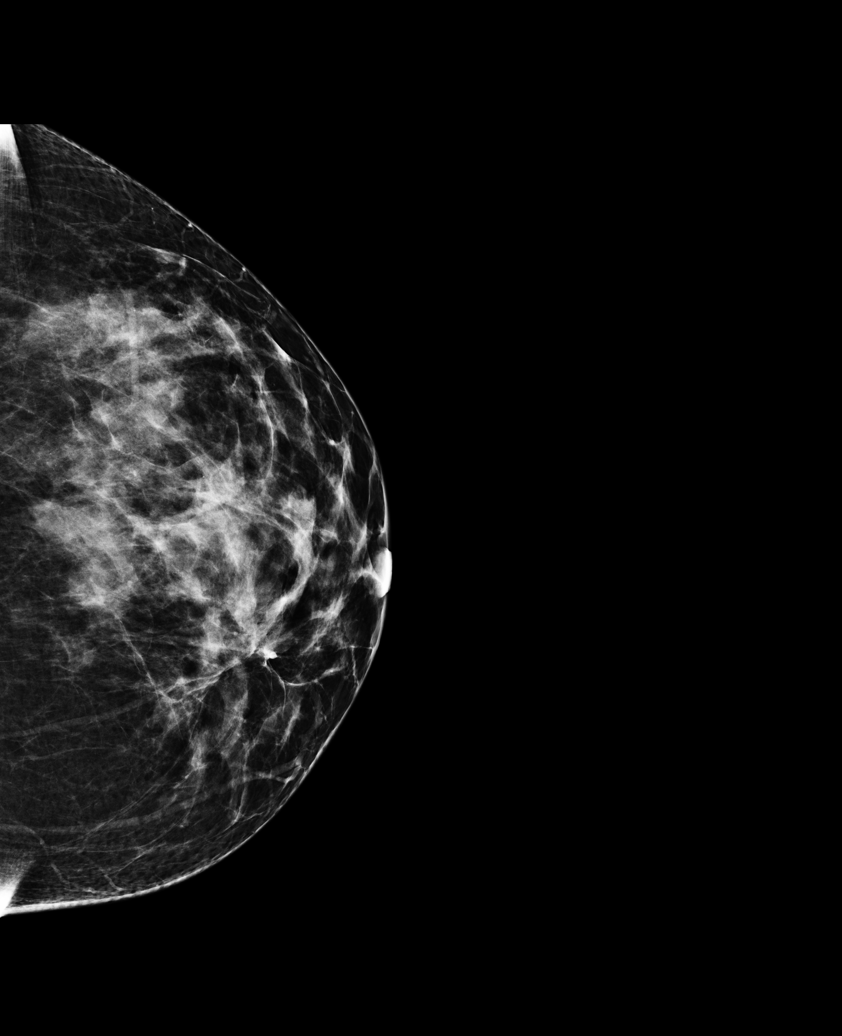

[R MLO (2 of 2)]
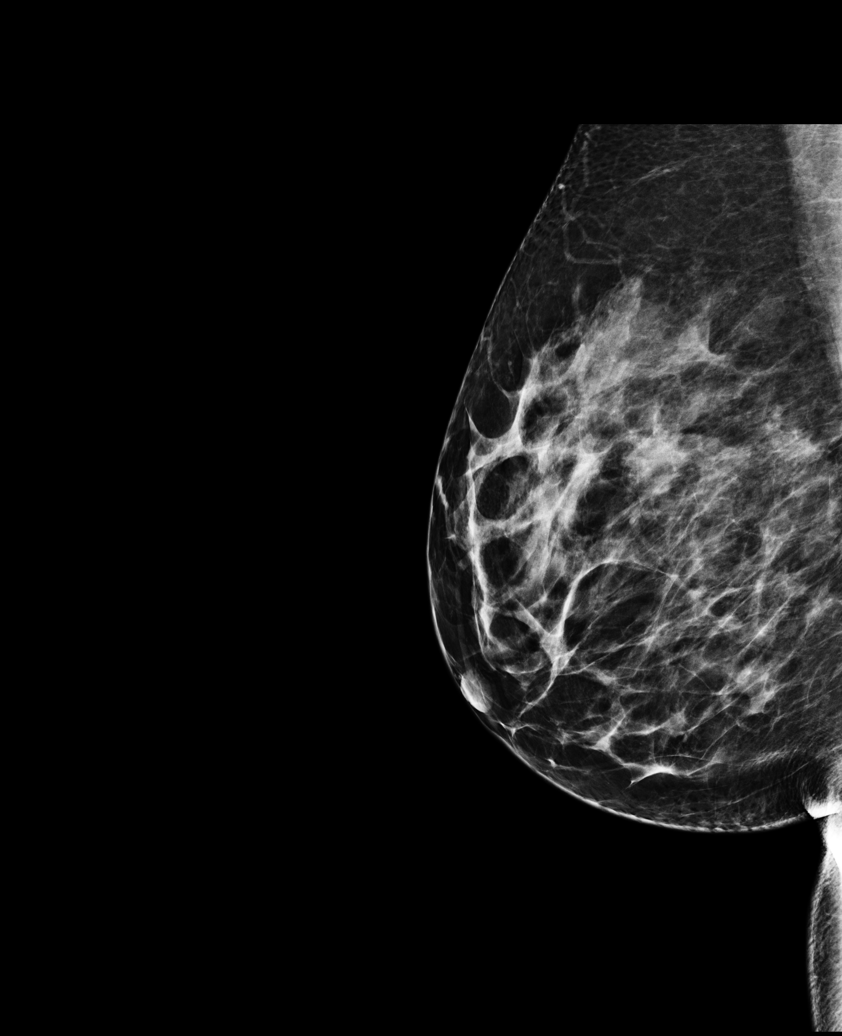

[L MLO tomo · tomo slice 43/84.0]
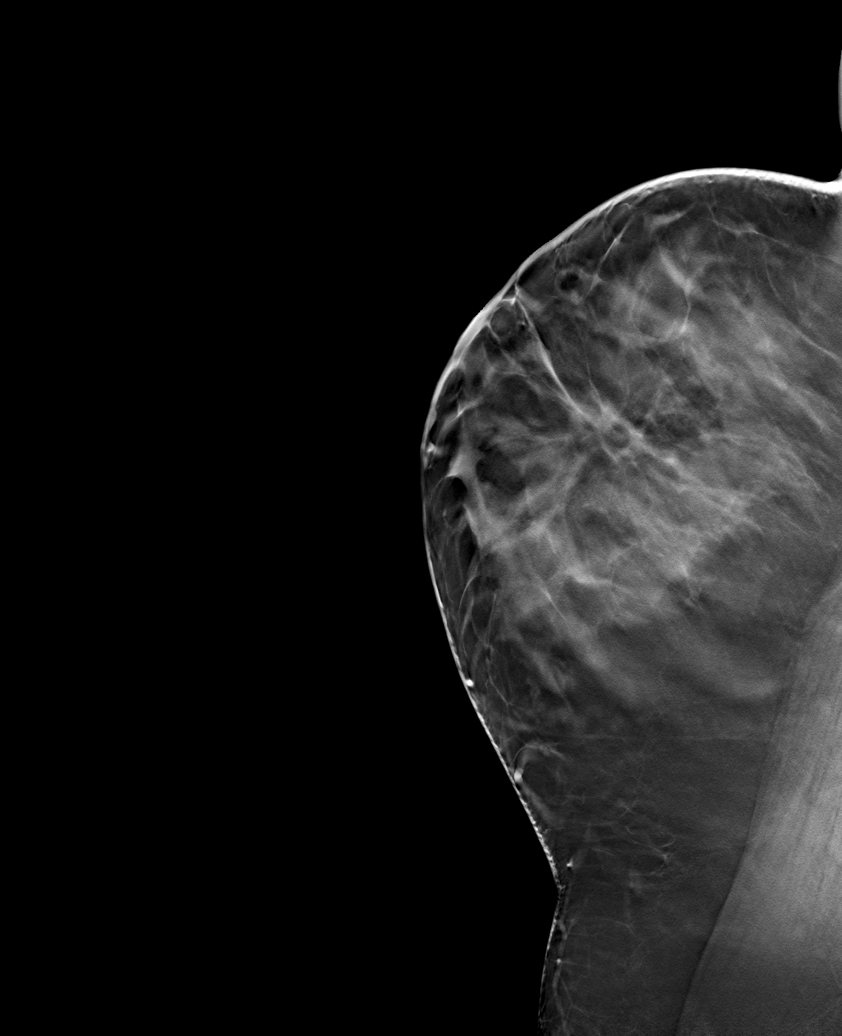

[6 of 25 positions shown; findings below may reference images not displayed]

FINDINGS: ACR Breast Density Category b: There are scattered areas of
fibroglandular density.

There is no suspicious dominant mass, architectural distortion, or
calcification to suggest malignancy. Excisional biopsy changes are
noted in the medial half of the left breast.

Images were processed with CAD.
IMPRESSION: No mammographic evidence of malignancy.

A result letter of this screening mammogram will be mailed directly
to the patient.

RECOMMENDATION:
Screening mammogram in one year. (Code:[91])

BI-RADS CATEGORY 1:  Negative.

## 2013-03-30 ENCOUNTER — Encounter: Payer: Self-pay | Admitting: Family Medicine

## 2013-03-30 ENCOUNTER — Ambulatory Visit (INDEPENDENT_AMBULATORY_CARE_PROVIDER_SITE_OTHER): Payer: 59 | Admitting: Family Medicine

## 2013-03-30 VITALS — BP 122/80 | Temp 98.3°F | Ht 65.5 in | Wt 190.0 lb

## 2013-03-30 DIAGNOSIS — G43109 Migraine with aura, not intractable, without status migrainosus: Secondary | ICD-10-CM | POA: Insufficient documentation

## 2013-03-30 MED ORDER — ZOLMITRIPTAN 5 MG PO TABS: 5.0000 mg | ORAL_TABLET | ORAL | Status: DC | PRN

## 2013-03-30 MED ORDER — MECLIZINE HCL 25 MG PO TABS: 25.0000 mg | ORAL_TABLET | Freq: Three times a day (TID) | ORAL | Status: DC | PRN

## 2013-03-30 MED ORDER — ONDANSETRON HCL 8 MG PO TABS: 8.0000 mg | ORAL_TABLET | Freq: Three times a day (TID) | ORAL | Status: DC | PRN

## 2013-03-30 NOTE — Patient Instructions (Signed)
zomig- for migraines - one at the first sign of the aura, may repeat in 2 hours if still a headache  zofran- for nausea may be used 3 times a day  antivert-meclizine- use as needed for dizziness  Keep track of the frequency  Migraine Headache A migraine headache is an intense, throbbing pain on one or both sides of your head. A migraine can last for 30 minutes to several hours. CAUSES  The exact cause of a migraine headache is not always known. However, a migraine may be caused when nerves in the brain become irritated and release chemicals that cause inflammation. This causes pain. SYMPTOMS  Pain on one or both sides of your head.  Pulsating or throbbing pain.  Severe pain that prevents daily activities.  Pain that is aggravated by any physical activity.  Nausea, vomiting, or both.  Dizziness.  Pain with exposure to bright lights, loud noises, or activity.  General sensitivity to bright lights, loud noises, or smells. Before you get a migraine, you may get warning signs that a migraine is coming (aura). An aura may include:  Seeing flashing lights.  Seeing bright spots, halos, or zig-zag lines.  Having tunnel vision or blurred vision.  Having feelings of numbness or tingling.  Having trouble talking.  Having muscle weakness. MIGRAINE TRIGGERS  Alcohol.  Smoking.  Stress.  Menstruation.  Aged cheeses.  Foods or drinks that contain nitrates, glutamate, aspartame, or tyramine.  Lack of sleep.  Chocolate.  Caffeine.  Hunger.  Physical exertion.  Fatigue.  Medicines used to treat chest pain (nitroglycerine), birth control pills, estrogen, and some blood pressure medicines. DIAGNOSIS  A migraine headache is often diagnosed based on:  Symptoms.  Physical examination.  A CT scan or MRI of your head. TREATMENT Medicines may be given for pain and nausea. Medicines can also be given to help prevent recurrent migraines.  HOME CARE  INSTRUCTIONS  Only take over-the-counter or prescription medicines for pain or discomfort as directed by your caregiver. The use of long-term narcotics is not recommended.  Lie down in a dark, quiet room when you have a migraine.  Keep a journal to find out what may trigger your migraine headaches. For example, write down:  What you eat and drink.  How much sleep you get.  Any change to your diet or medicines.  Limit alcohol consumption.  Quit smoking if you smoke.  Get 7 to 9 hours of sleep, or as recommended by your caregiver.  Limit stress.  Keep lights dim if bright lights bother you and make your migraines worse. SEEK IMMEDIATE MEDICAL CARE IF:   Your migraine becomes severe.  You have a fever.  You have a stiff neck.  You have vision loss.  You have muscular weakness or loss of muscle control.  You start losing your balance or have trouble walking.  You feel faint or pass out.  You have severe symptoms that are different from your first symptoms. MAKE SURE YOU:   Understand these instructions.  Will watch your condition.  Will get help right away if you are not doing well or get worse. Document Released: 08/04/2005 Document Revised: 10/27/2011 Document Reviewed: 07/25/2011 [facility] Patient Information 2014 [place], [place].

## 2013-03-30 NOTE — Progress Notes (Signed)
  Subjective:    Patient ID: Diana Tate, female    DOB: 02-20-1971, 42 y.o.   MRN: [id number]  Sinus Problem This is a new problem. The current episode started in the past 7 days. Associated symptoms include headaches and sinus pressure. Pertinent negatives include no chills, congestion, coughing, neck pain or shortness of breath.   This patient relates after she had a funny feeling in her head last a couple days no high fever chills. Then she's had visual aura is where she feels like there is flashing lights then she gets a headache after this has happened 2 separate times no vomiting with it but she has had nausea with it. She had these once before approximately 10 years ago but has not had anything recently. She denies being under any excessive stress she does not drink enough fluids throughout the day she does relate no headaches at night does not wake her up at night no double vision or vomiting. In the past she never had to take anything other than Imitrex. Patient does relate some intermittent dizziness feels like things are moving arm shifting on her this happens intermittently  Review of Systems  Constitutional: Negative for fever, chills, appetite change and fatigue.  HENT: Positive for sinus pressure. Negative for congestion, rhinorrhea and neck pain.   Respiratory: Negative for cough and shortness of breath.   Cardiovascular: Negative for chest pain.  Gastrointestinal: Positive for nausea. Negative for abdominal pain.  Neurological: Positive for dizziness and headaches.       Objective:   Physical Exam Lungs are clear hearts regular pulse normal pupils responsive to light eardrums normal horizontal nystagmus noted       Assessment & Plan:  #1 vertigo symptoms-Antivert 4 times a day when necessary #2 classic migraines with aura-Zomig as directed may repeat 2 hours later if need be Zofran for nausea if frequent headaches followup for discussion of preventative daily  medicines #3 Zofran if necessary for nausea. Followup when necessary or if migraines worsen warning signs were discussed patient does not have any red flags that would indicate the need for a CAT scan at this point

## 2013-04-07 ENCOUNTER — Encounter: Payer: Self-pay | Admitting: *Deleted

## 2013-09-20 ENCOUNTER — Ambulatory Visit (INDEPENDENT_AMBULATORY_CARE_PROVIDER_SITE_OTHER): Payer: 59 | Admitting: Family Medicine

## 2013-09-20 ENCOUNTER — Encounter: Payer: Self-pay | Admitting: Family Medicine

## 2013-09-20 VITALS — BP 110/80 | Temp 98.7°F | Ht 65.5 in | Wt 186.0 lb

## 2013-09-20 DIAGNOSIS — I889 Nonspecific lymphadenitis, unspecified: Secondary | ICD-10-CM

## 2013-09-20 MED ORDER — CLARITHROMYCIN 500 MG PO TABS: 500.0000 mg | ORAL_TABLET | Freq: Two times a day (BID) | ORAL | Status: AC

## 2013-09-20 NOTE — Progress Notes (Signed)
   Subjective:    Patient ID: Diana Tate, female    DOB: [birth year], 43 y.o.   MRN: [id number]  Sore Throat  This is a new problem. The current episode started in the past 7 days. The problem has been gradually worsening. Neither side of throat is experiencing more pain than the other. There has been no fever. The pain is at a severity of 8/10. The pain is moderate. Associated symptoms include headaches and a hoarse voice. She has tried NSAIDs for the symptoms. The treatment provided moderate relief.   Scratchy throat yest morn, felt bad  Last night  Bad throat pain  This morn, was really bad, voice gone hurt bad  Very infalmed  Some irritation cough,  No known exposure  advil and theraflu prn     Review of Systems  HENT: Positive for hoarse voice.   Neurological: Positive for headaches.   no vomiting no diarrhea ROS otherwise negative     Objective:   Physical Exam Alert significant malaise. HEENT no obvious nasal congestion. Pharynx erythematous. Neck supple. Tender anterior cervical nodes. Lungs clear. Heart regular in rhythm. Impression pharyngitis with lymphadenitis discussed plan Biaxin twice a day 10 days. Dukes Magic mouthwash.       Assessment & Plan:  See above.

## 2013-09-22 ENCOUNTER — Telehealth: Payer: Self-pay | Admitting: Family Medicine

## 2013-09-22 MED ORDER — BENZONATATE 100 MG PO CAPS: 100.0000 mg | ORAL_CAPSULE | Freq: Three times a day (TID) | ORAL | Status: DC | PRN

## 2013-09-22 NOTE — Telephone Encounter (Signed)
Medication sent to pharmacy. Patient was notified.  

## 2013-09-22 NOTE — Telephone Encounter (Signed)
Pt calling to see if we can call something in for her cough, using the mouth wash you gave Her but its not touching the cough. Coughs almost to the point of gagging.   [facility] [address]

## 2013-09-22 NOTE — Telephone Encounter (Signed)
Tessalon 100 mg , one tid prn cough, #30, 1 refill

## 2013-12-12 ENCOUNTER — Encounter: Payer: Self-pay | Admitting: Family Medicine

## 2013-12-12 ENCOUNTER — Ambulatory Visit (INDEPENDENT_AMBULATORY_CARE_PROVIDER_SITE_OTHER): Payer: 59 | Admitting: Family Medicine

## 2013-12-12 VITALS — BP 104/76 | Temp 98.6°F | Ht 65.5 in | Wt 187.0 lb

## 2013-12-12 DIAGNOSIS — K219 Gastro-esophageal reflux disease without esophagitis: Secondary | ICD-10-CM

## 2013-12-12 MED ORDER — PANTOPRAZOLE SODIUM 40 MG PO TBEC: 40.0000 mg | DELAYED_RELEASE_TABLET | Freq: Every day | ORAL | Status: DC

## 2013-12-12 NOTE — Progress Notes (Signed)
   Subjective:    Patient ID: Diana Tate, female    DOB: [birth year], 43 y.o.   MRN: [id number]  Fever  This is a new problem. The current episode started today. The problem occurs intermittently. The problem has been unchanged. Her temperature was unmeasured prior to arrival. Associated symptoms include headaches. Associated symptoms comments: Neck stiffness, fatigue. She has tried NSAIDs for the symptoms. The treatment provided moderate relief.   Patient states she wants another medication prescribed for her acid reflux. Patient has taken Nexium and Prilosec OTC without any relief.   Hit pretty hard today.  Felt really bad, chills head hurt  Felt hot  Took ibu four per dose, no fever since  nefck was stiff and achey  HA some today, no recent tick bites.  Stomach reflux symtoms past couple months one or two cokes per d  Worse past few months, nexium otc stopped working, took Financial controller and now that's not working  Takes the meds off and on, takes a couple d to calm down  No chfronic antiinflam med usen   Review of Systems  Constitutional: Positive for fever.  Neurological: Positive for headaches.   no chest pain no back pain no change in urinary habits ROS otherwise negative     Objective:   Physical Exam  Alert mild malaise neck supple HEENT normal lungs clear. Heart regular in rhythm. Slight epigastric tenderness.      Assessment & Plan:  Impression 1 viral syndrome no history of tick bite. #2 reflux discussed at length plan educational information given. Protonic 40 every morning. Try to use to regular floor next couple months and when necessary. Warning signs discussed. WSL

## 2013-12-12 NOTE — Patient Instructions (Signed)
Gastroesophageal Reflux Disease, Adult  Gastroesophageal reflux disease (GERD) happens when acid from your stomach flows up into the esophagus. When acid comes in contact with the esophagus, the acid causes soreness (inflammation) in the esophagus. Over time, GERD may create small holes (ulcers) in the lining of the esophagus.  CAUSES   · Increased body weight. This puts pressure on the stomach, making acid rise from the stomach into the esophagus.  · Smoking. This increases acid production in the stomach.  · Drinking alcohol. This causes decreased pressure in the lower esophageal sphincter (valve or ring of muscle between the esophagus and stomach), allowing acid from the stomach into the esophagus.  · Late evening meals and a full stomach. This increases pressure and acid production in the stomach.  · A malformed lower esophageal sphincter.  Sometimes, no cause is found.  SYMPTOMS   · Burning pain in the lower part of the mid-chest behind the breastbone and in the mid-stomach area. This may occur twice a week or more often.  · Trouble swallowing.  · Sore throat.  · Dry cough.  · Asthma-like symptoms including chest tightness, shortness of breath, or wheezing.  DIAGNOSIS   Your caregiver may be able to diagnose GERD based on your symptoms. In some cases, X-rays and other tests may be done to check for complications or to check the condition of your stomach and esophagus.  TREATMENT   Your caregiver may recommend over-the-counter or prescription medicines to help decrease acid production. Ask your caregiver before starting or adding any new medicines.   HOME CARE INSTRUCTIONS   · Change the factors that you can control. Ask your caregiver for guidance concerning weight loss, quitting smoking, and alcohol consumption.  · Avoid foods and drinks that make your symptoms worse, such as:  · Caffeine or alcoholic drinks.  · Chocolate.  · Peppermint or mint flavorings.  · Garlic and onions.  · Spicy foods.  · Citrus fruits,  such as oranges, lemons, or limes.  · Tomato-based foods such as sauce, chili, salsa, and pizza.  · Fried and fatty foods.  · Avoid lying down for the 3 hours prior to your bedtime or prior to taking a nap.  · Eat small, frequent meals instead of large meals.  · Wear loose-fitting clothing. Do not wear anything tight around your waist that causes pressure on your stomach.  · Raise the head of your bed 6 to 8 inches with wood blocks to help you sleep. Extra pillows will not help.  · Only take over-the-counter or prescription medicines for pain, discomfort, or fever as directed by your caregiver.  · Do not take aspirin, ibuprofen, or other nonsteroidal anti-inflammatory drugs (NSAIDs).  SEEK IMMEDIATE MEDICAL CARE IF:   · You have pain in your arms, neck, jaw, teeth, or back.  · Your pain increases or changes in intensity or duration.  · You develop nausea, vomiting, or sweating (diaphoresis).  · You develop shortness of breath, or you faint.  · Your vomit is green, yellow, black, or looks like coffee grounds or blood.  · Your stool is red, bloody, or black.  These symptoms could be signs of other problems, such as heart disease, gastric bleeding, or esophageal bleeding.  MAKE SURE YOU:   · Understand these instructions.  · Will watch your condition.  · Will get help right away if you are not doing well or get worse.  Document Released: 05/14/2005 Document Revised: 10/27/2011 Document Reviewed: 02/21/2011  [employer]® Patient   Information ©2014 [employer].

## 2014-02-08 ENCOUNTER — Other Ambulatory Visit: Payer: Self-pay | Admitting: *Deleted

## 2014-02-08 MED ORDER — PANTOPRAZOLE SODIUM 40 MG PO TBEC: 40.0000 mg | DELAYED_RELEASE_TABLET | Freq: Every day | ORAL | Status: DC

## 2014-02-10 ENCOUNTER — Other Ambulatory Visit: Payer: Self-pay | Admitting: *Deleted

## 2014-02-20 ENCOUNTER — Other Ambulatory Visit (INDEPENDENT_AMBULATORY_CARE_PROVIDER_SITE_OTHER): Payer: Self-pay | Admitting: Surgery

## 2014-02-20 ENCOUNTER — Other Ambulatory Visit: Payer: Self-pay | Admitting: Obstetrics and Gynecology

## 2014-02-20 DIAGNOSIS — Z853 Personal history of malignant neoplasm of breast: Secondary | ICD-10-CM

## 2014-03-21 ENCOUNTER — Other Ambulatory Visit: Payer: Self-pay | Admitting: *Deleted

## 2014-03-21 MED ORDER — PANTOPRAZOLE SODIUM 40 MG PO TBEC: 40.0000 mg | DELAYED_RELEASE_TABLET | Freq: Every day | ORAL | Status: DC

## 2014-03-22 ENCOUNTER — Encounter (INDEPENDENT_AMBULATORY_CARE_PROVIDER_SITE_OTHER): Payer: Self-pay

## 2014-03-22 ENCOUNTER — Other Ambulatory Visit (INDEPENDENT_AMBULATORY_CARE_PROVIDER_SITE_OTHER): Payer: Self-pay | Admitting: Surgery

## 2014-03-22 ENCOUNTER — Ambulatory Visit
Admission: RE | Admit: 2014-03-22 | Discharge: 2014-03-22 | Disposition: A | Discharge status: Home / Self Care | Payer: 59 | Source: Ambulatory Visit | Attending: Obstetrics and Gynecology | Admitting: Obstetrics and Gynecology

## 2014-03-22 ENCOUNTER — Ambulatory Visit
Admission: RE | Admit: 2014-03-22 | Discharge: 2014-03-22 | Disposition: A | Discharge status: Home / Self Care | Payer: 59 | Source: Ambulatory Visit | Attending: Surgery | Admitting: Surgery

## 2014-03-22 DIAGNOSIS — Z853 Personal history of malignant neoplasm of breast: Secondary | ICD-10-CM

## 2014-03-22 IMAGING — MG MM SCREENING BREAST TOMO BILATERAL
8 of 12 series · 8 of 28 positions shown · non-contrast
Comparison: Previous exam(s).

CLINICAL DATA: Screening.

EXAM:
DIGITAL SCREENING BILATERAL MAMMOGRAM WITH 3D TOMO WITH CAD

[L MLO]
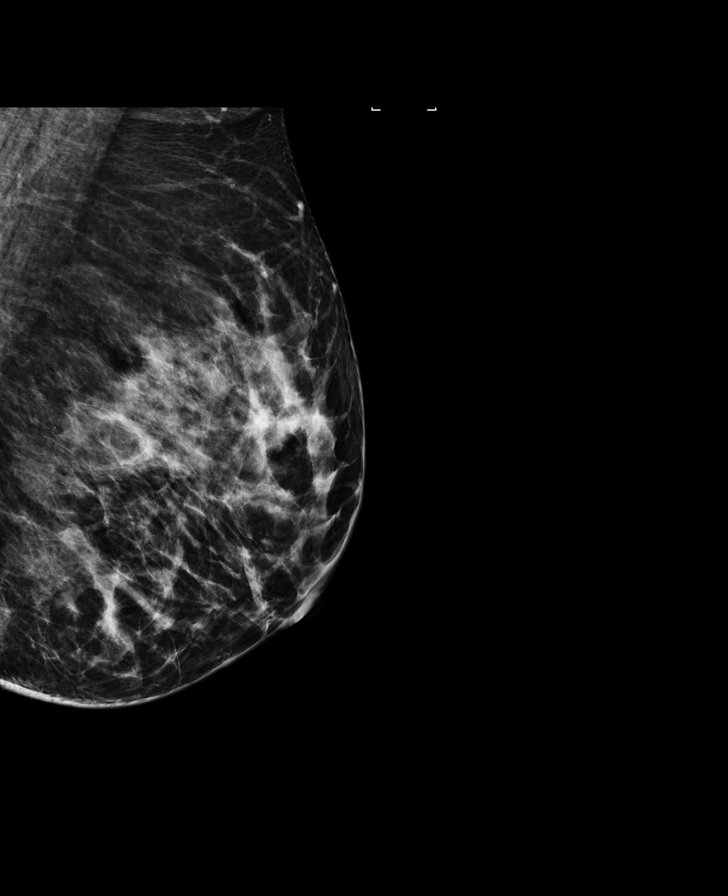

[R MLO synth-2D]
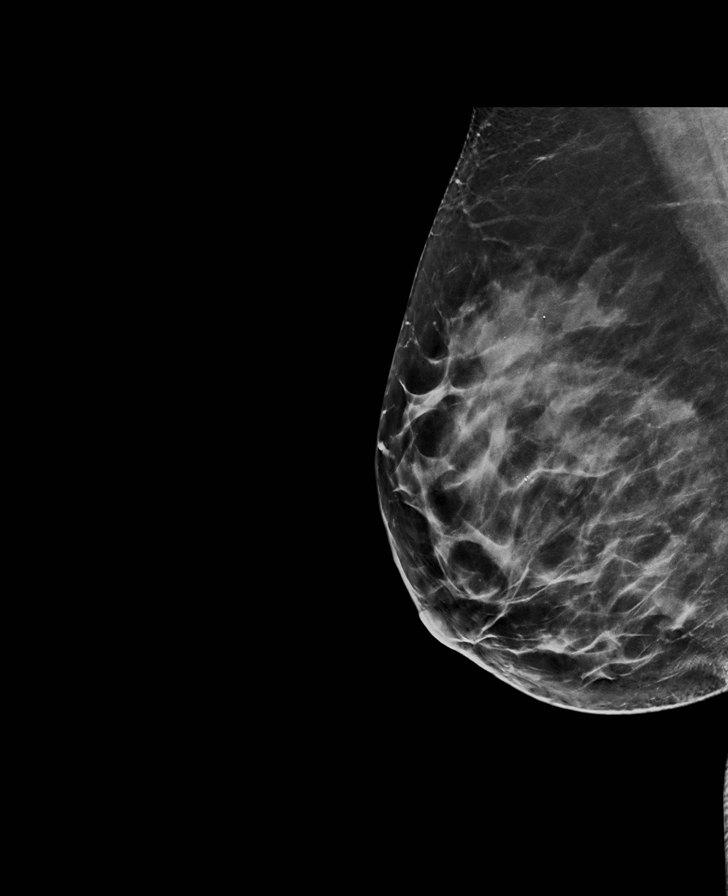

[R MLO]
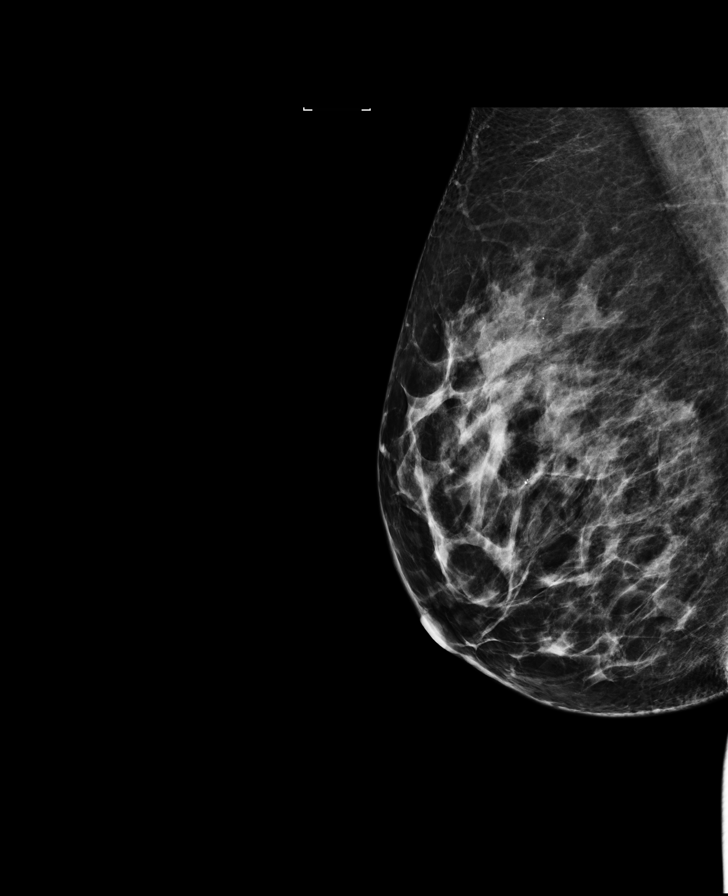

[R CC]
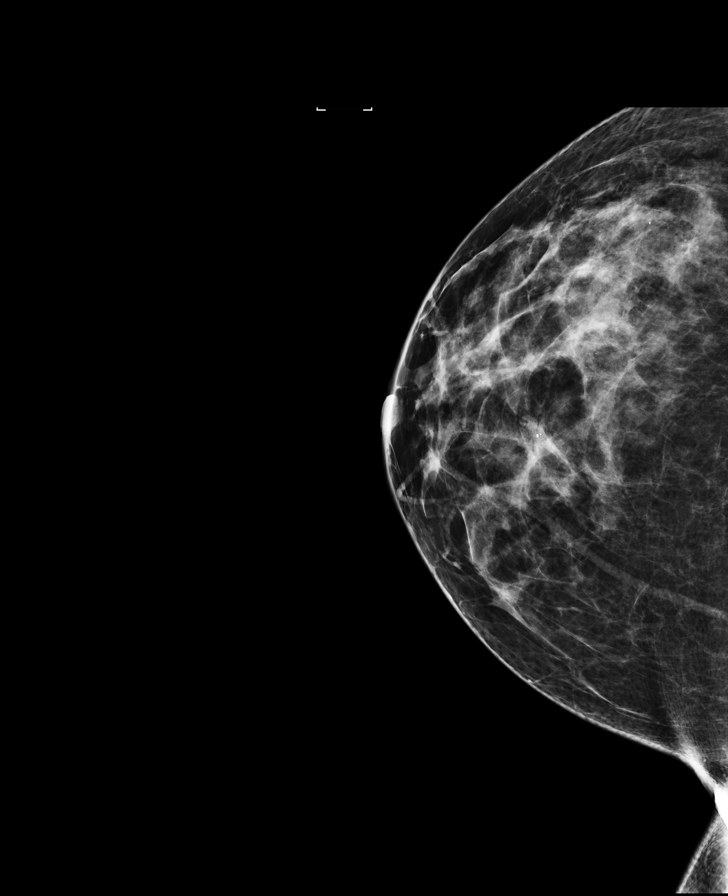

[L CC]
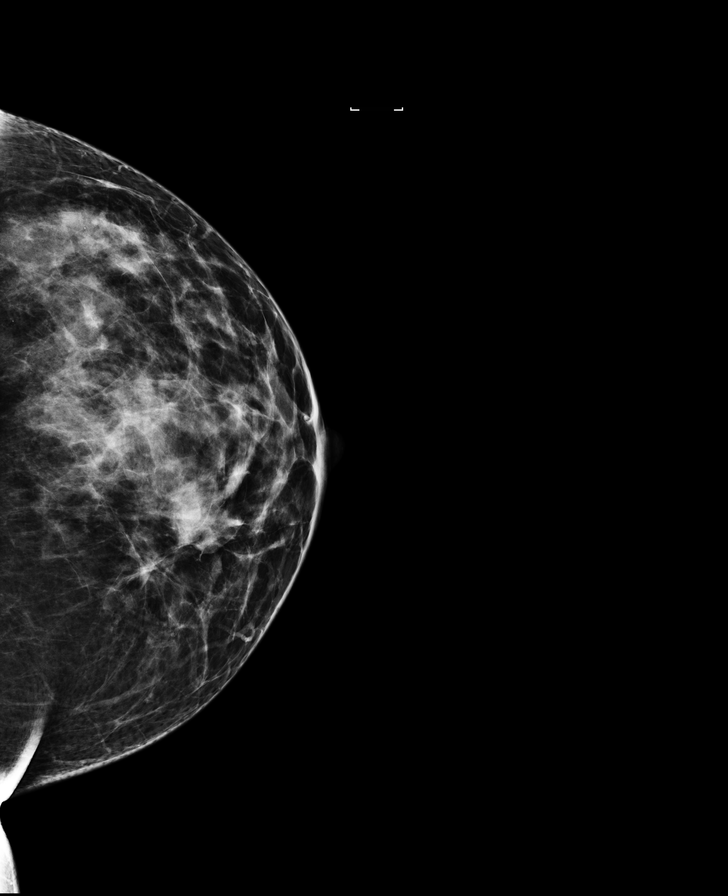

[L MLO synth-2D]
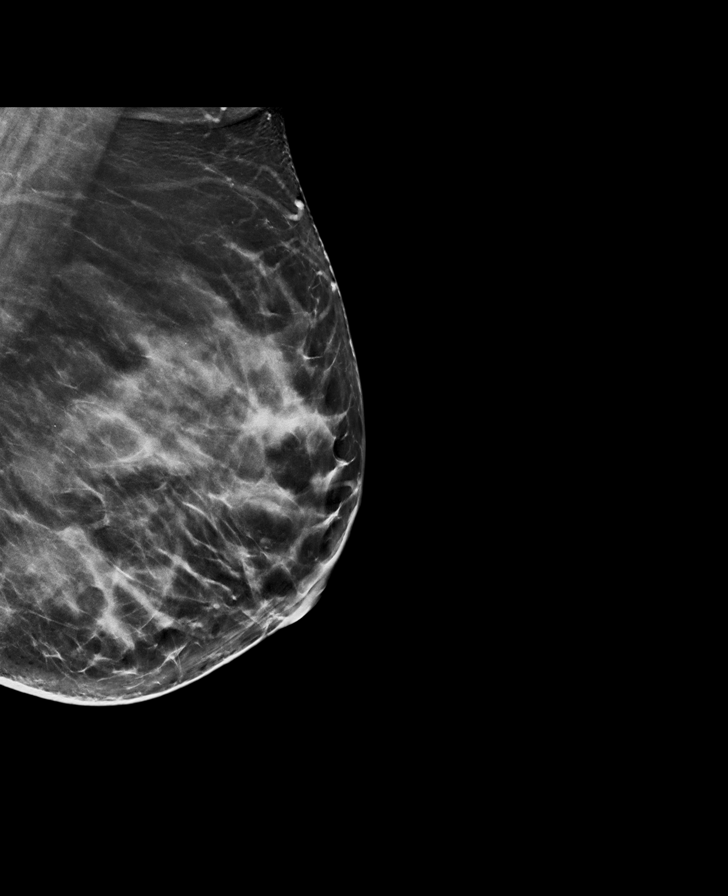

[L CC synth-2D]
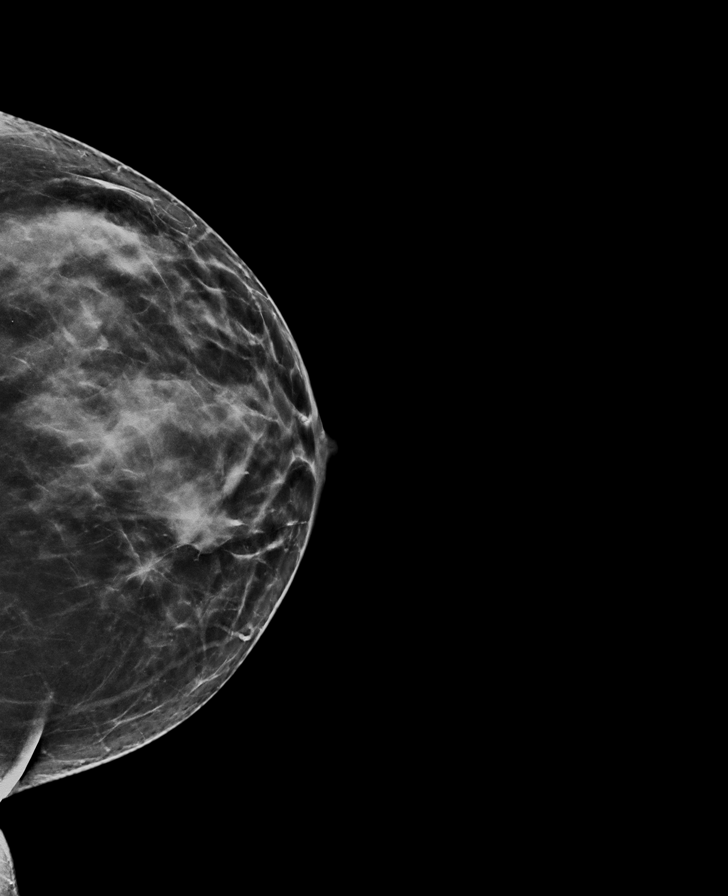

[R CC synth-2D]
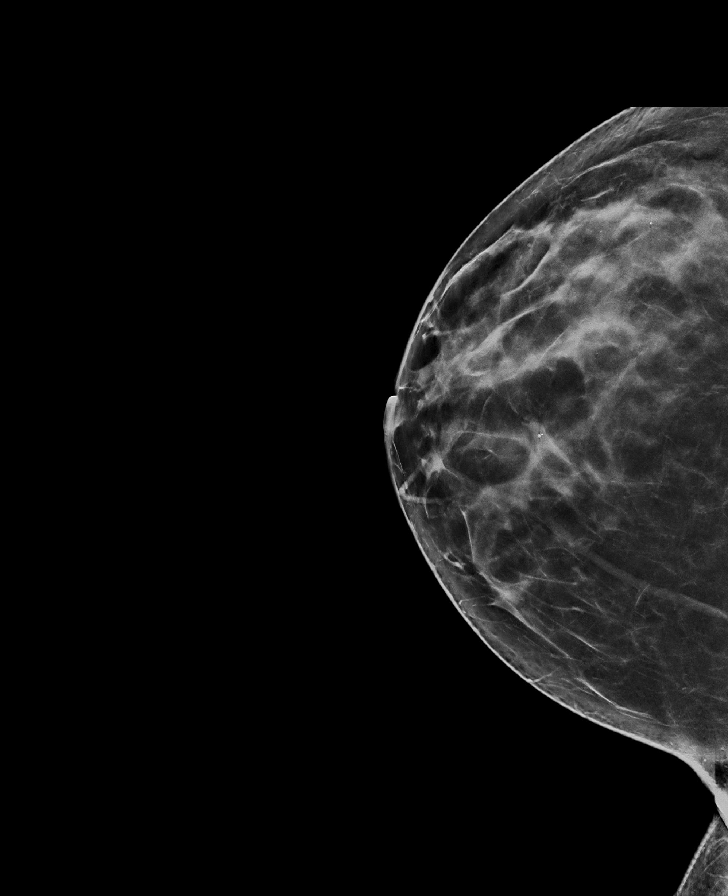

[8 of 28 positions shown; findings below may reference images not displayed]

ACR Breast Density Category c: The breast tissue is heterogeneously
dense, which may obscure small masses.
FINDINGS: There are no findings suspicious for malignancy. Images were
processed with CAD.
IMPRESSION: No mammographic evidence of malignancy. A result letter of this
screening mammogram will be mailed directly to the patient.

RECOMMENDATION:
Screening mammogram in one year. (Code:[SM])

BI-RADS CATEGORY  1: Negative.

## 2014-03-29 ENCOUNTER — Ambulatory Visit (INDEPENDENT_AMBULATORY_CARE_PROVIDER_SITE_OTHER): Payer: 59 | Admitting: Nurse Practitioner

## 2014-03-29 ENCOUNTER — Encounter: Payer: Self-pay | Admitting: Nurse Practitioner

## 2014-03-29 VITALS — BP 128/78 | Temp 98.6°F | Ht 65.5 in | Wt 182.4 lb

## 2014-03-29 DIAGNOSIS — K2100 Gastro-esophageal reflux disease with esophagitis, without bleeding: Secondary | ICD-10-CM

## 2014-03-29 MED ORDER — SUCRALFATE 1 GM/10ML PO SUSP: 1.0000 g | Freq: Three times a day (TID) | ORAL | Status: DC

## 2014-03-31 ENCOUNTER — Other Ambulatory Visit: Payer: Self-pay | Admitting: *Deleted

## 2014-03-31 ENCOUNTER — Encounter: Payer: Self-pay | Admitting: Nurse Practitioner

## 2014-03-31 DIAGNOSIS — K219 Gastro-esophageal reflux disease without esophagitis: Secondary | ICD-10-CM

## 2014-03-31 NOTE — Progress Notes (Signed)
Subjective:  Presents for complaints of a flareup of her acid reflux. Protonix worked well for about a month then off and on and gradually symptoms have worsened again. Severe symptoms for the past 2 nights, has been sleeping on 3 pillows. Unassociated with any particular foods. Unable to eat a regular meal, has tried smaller meals. Does not eat within 3 hours of going to bed. No tobacco use. No alcohol use. On occasion when she has tried to drink alcohol, will have severe epigastric pain for about 15 minutes. No excessive NSAID use. No diarrhea or constipation. No change in the color of her stools. No obvious blood. No fever. Nausea but no vomiting. Denies any unusual stress but has started a new job which she enjoys but has been very busy.  Objective:   BP 128/78  Temp(Src) 98.6 F (37 C) (Oral)  Ht 5' 5.5" (1.664 m)  Wt 182 lb 6 oz (82.725 kg)  BMI 29.88 kg/m2 NAD. Alert, oriented. Lungs clear. Heart regular rate rhythm. Distinct mild epigastric area tenderness noted on exam.  Assessment:  Problem List Items Addressed This Visit     Digestive   Esophageal reflux - Primary   Relevant Medications      sucralfate (CARAFATE) 1 GM/10ML suspension   Other Relevant Orders      Ambulatory referral to Gastroenterology     Plan:  Meds ordered this encounter  Medications  . sucralfate (CARAFATE) 1 GM/10ML suspension    Sig: Take 10 mLs (1 g total) by mouth 4 (four) times daily -  with meals and at bedtime.    Dispense:  420 mL    Refill:  0    Order Specific Question:  Supervising Provider    Answer:  Mikey Kirschner [[id number]]   Increase Protonix to twice a day. Add Carafate as directed when necessary. Warning signs reviewed. Refer to GI specialist, call back sooner if any problems.

## 2014-04-06 ENCOUNTER — Telehealth: Payer: Self-pay | Admitting: Family Medicine

## 2014-04-06 NOTE — Telephone Encounter (Signed)
Checking on referral to gastro.

## 2014-04-06 NOTE — Telephone Encounter (Signed)
Called pt, explained that referral was sent to [facility], they will contact her to set up, pt verbalized understanding, gave # for her to call them to check on referral

## 2014-04-13 ENCOUNTER — Encounter: Payer: Self-pay | Admitting: Gastroenterology

## 2014-05-01 ENCOUNTER — Other Ambulatory Visit: Payer: Self-pay | Admitting: Nurse Practitioner

## 2014-05-01 ENCOUNTER — Telehealth: Payer: Self-pay | Admitting: Family Medicine

## 2014-05-01 NOTE — Telephone Encounter (Signed)
sucralfate (CARAFATE) 1 GM/10ML suspension  Pt supposed to have this till she was able to get into the  GI doc, however it took some time to get her in. Can she get a  Refill? For at least another  Month  [facility]

## 2014-05-01 NOTE — Telephone Encounter (Signed)
Please send in 3 refills

## 2014-05-02 MED ORDER — SUCRALFATE 1 GM/10ML PO SUSP: 1.0000 g | Freq: Three times a day (TID) | ORAL | Status: DC

## 2014-05-02 NOTE — Telephone Encounter (Signed)
Rx sent electronically to pharmacy. Patient notified. 

## 2014-05-02 NOTE — Addendum Note (Signed)
Addended by: Dairl Ponder on: 05/02/2014 09:00 AM   Modules accepted: Orders

## 2014-05-16 ENCOUNTER — Encounter: Payer: Self-pay | Admitting: Gastroenterology

## 2014-05-16 ENCOUNTER — Encounter (HOSPITAL_COMMUNITY): Payer: Self-pay | Admitting: Pharmacy Technician

## 2014-05-16 ENCOUNTER — Other Ambulatory Visit: Payer: Self-pay

## 2014-05-16 ENCOUNTER — Ambulatory Visit (INDEPENDENT_AMBULATORY_CARE_PROVIDER_SITE_OTHER): Payer: 59 | Admitting: Gastroenterology

## 2014-05-16 VITALS — BP 114/78 | HR 75 | Temp 98.2°F | Ht 65.5 in | Wt 185.0 lb

## 2014-05-16 DIAGNOSIS — R198 Other specified symptoms and signs involving the digestive system and abdomen: Secondary | ICD-10-CM | POA: Insufficient documentation

## 2014-05-16 DIAGNOSIS — K209 Esophagitis, unspecified without bleeding: Secondary | ICD-10-CM

## 2014-05-16 DIAGNOSIS — K2100 Gastro-esophageal reflux disease with esophagitis, without bleeding: Secondary | ICD-10-CM

## 2014-05-16 DIAGNOSIS — R1013 Epigastric pain: Secondary | ICD-10-CM

## 2014-05-16 DIAGNOSIS — K219 Gastro-esophageal reflux disease without esophagitis: Secondary | ICD-10-CM

## 2014-05-16 MED ORDER — HYOSCYAMINE SULFATE 0.125 MG SL SUBL: 0.1250 mg | SUBLINGUAL_TABLET | Freq: Four times a day (QID) | SUBLINGUAL | Status: DC | PRN

## 2014-05-16 NOTE — Progress Notes (Signed)
Primary Care Physician:  Sallee Lange, MD  Primary Gastroenterologist:  Barney Drain, MD   Chief Complaint  Patient presents with  . Gastrophageal Reflux    HPI:  Diana Tate is a 43 y.o. female here for further evaluation of refractory GERD.   Six month h/o severe GERD/regurgitation/nocturnal symptoms. Several year history of GERD but less severe. No dysphagia. On current regimen since 03/2014. Since gb removed in 2006, has intermittent postprandial loose stools. But sometimes may not go for a week without a BM. 50% loose stools/50% infrequent BM. Seems to be more related to how long waits for breakfast, not necessarily what or where eats. No melena, brbpr. Some brbpr with hemorrhoid flares. No ASA. Rare Advil use. Intermittent postprandial epigastric pain. Really not interested in colonoscopy at this time.    Current Outpatient Prescriptions  Medication Sig Dispense Refill  . pantoprazole (PROTONIX) 40 MG tablet Take 40 mg by mouth 2 (two) times daily.      . sucralfate (CARAFATE) 1 GM/10ML suspension Take 1 g by mouth at bedtime.      .         No current facility-administered medications for this visit.    Allergies as of 05/16/2014  . (No Known Allergies)    Past Medical History  Diagnosis Date  . GERD (gastroesophageal reflux disease)   . Hyperlipidemia   . Migraine     Classic    Past Surgical History  Procedure Laterality Date  . Cholecystectomy  2006  . Tonsillectomy  2000  . Endometrial ablation  2007  . Breast surgery      benign    Family History  Problem Relation Age of Onset  . Cancer Maternal Uncle     lung  . Cancer Maternal Grandfather     skin  . Hypertension Mother   . Colon cancer Neg Hx   . Esophageal cancer Neg Hx   . Diabetes Other     second degree relative    History   Social History  . Marital Status: Married    Spouse Name: N/A    Number of Children: 2  . Years of Education: N/A   Occupational History  . DEPUTY CLERK     Social History Main Topics  . Smoking status: Former Smoker -- 1.00 packs/day for 24 years    Types: Cigarettes  . Smokeless tobacco: Never Used  . Alcohol Use: No     Comment: occ  . Drug Use: No  . Sexual Activity: Yes   Other Topics Concern  . Not on file   Social History Narrative  . No narrative on file      ROS:  General: Negative for anorexia, weight loss, fever, chills, fatigue, weakness. Eyes: Negative for vision changes.  ENT: Negative for hoarseness, difficulty swallowing , nasal congestion. CV: Negative for chest pain, angina, palpitations, dyspnea on exertion, peripheral edema.  Respiratory: Negative for dyspnea at rest, dyspnea on exertion, cough, sputum, wheezing.  GI: See history of present illness. GU:  Negative for dysuria, hematuria, urinary incontinence, urinary frequency, nocturnal urination.  MS: Negative for joint pain, low back pain.  Derm: Negative for rash or itching.  Neuro: Negative for weakness, abnormal sensation, seizure, frequent headaches, memory loss, confusion.  Psych: Negative for anxiety, depression, suicidal ideation, hallucinations.  Endo: Negative for unusual weight change.  Heme: Negative for bruising or bleeding. Allergy: Negative for rash or hives.    Physical Examination:  BP 114/78  Pulse 75  Temp(Src) 98.2  F (36.8 C) (Oral)  Ht 5' 5.5" (1.664 m)  Wt 185 lb (83.915 kg)  BMI 30.31 kg/m2   General: Well-nourished, well-developed in no acute distress.  Head: Normocephalic, atraumatic.   Eyes: Conjunctiva pink, no icterus. Mouth: Oropharyngeal mucosa moist and pink , no lesions erythema or exudate. Neck: Supple without thyromegaly, masses, or lymphadenopathy.  Lungs: Clear to auscultation bilaterally.  Heart: Regular rate and rhythm, no murmurs rubs or gallops.  Abdomen: Bowel sounds are normal, nontender, nondistended, no hepatosplenomegaly or masses, no abdominal bruits or    hernia , no rebound or guarding.    Rectal: not performed Extremities: No lower extremity edema. No clubbing or deformities.  Neuro: Alert and oriented x 4 , grossly normal neurologically.  Skin: Warm and dry, no rash or jaundice.   Psych: Alert and cooperative, normal mood and affect.

## 2014-05-16 NOTE — Assessment & Plan Note (Signed)
43 y/o female with h/o chronic intermittent reflux who presents with several month history of refractory GERD. Recent relief with addition of second dose of pantoprazole in the afternoon and carafate at bedtime.  C/o nocturnal GERD, postprandial epigastric pain. No significant etoh/NSAIDs/ASA. Ddx: refractory/complicated GERD, gastritis, PUD. Recommend EGD in near future.  I have discussed the risks, alternatives, benefits with regards to but not limited to the risk of reaction to medication, bleeding, infection, perforation and the patient is agreeable to proceed. Written consent to be obtained.  Discussed antireflux measures.

## 2014-05-16 NOTE — Progress Notes (Signed)
cc'ed to pcp °

## 2014-05-16 NOTE — Assessment & Plan Note (Signed)
Noted since 2006 post cholecystectomy. 50% of time with constipation. Suspect IBS. Trial of Levsin SL QID prn. Call with further concerns. Discussed colonoscopy option with patient but mutually agreed not necessary at this time. If her chronic symptoms change or she notes blood in the stool, would reconsider.

## 2014-05-16 NOTE — Patient Instructions (Signed)
1. Upper endoscopy with Dr. Oneida Alar as scheduled. See separate instructions. 2. Trial of Levsin up to four times daily as needed for loose stool, abdominal cramping. Hold for constipation.  Gastroesophageal Reflux Disease, Adult Gastroesophageal reflux disease (GERD) happens when acid from your stomach flows up into the esophagus. When acid comes in contact with the esophagus, the acid causes soreness (inflammation) in the esophagus. Over time, GERD may create small holes (ulcers) in the lining of the esophagus. CAUSES   Increased body weight. This puts pressure on the stomach, making acid rise from the stomach into the esophagus.  Smoking. This increases acid production in the stomach.  Drinking alcohol. This causes decreased pressure in the lower esophageal sphincter (valve or ring of muscle between the esophagus and stomach), allowing acid from the stomach into the esophagus.  Late evening meals and a full stomach. This increases pressure and acid production in the stomach.  A malformed lower esophageal sphincter. Sometimes, no cause is found. SYMPTOMS   Burning pain in the lower part of the mid-chest behind the breastbone and in the mid-stomach area. This may occur twice a week or more often.  Trouble swallowing.  Sore throat.  Dry cough.  Asthma-like symptoms including chest tightness, shortness of breath, or wheezing. DIAGNOSIS  Your caregiver may be able to diagnose GERD based on your symptoms. In some cases, X-rays and other tests may be done to check for complications or to check the condition of your stomach and esophagus. TREATMENT  Your caregiver may recommend over-the-counter or prescription medicines to help decrease acid production. Ask your caregiver before starting or adding any new medicines.  HOME CARE INSTRUCTIONS   Change the factors that you can control. Ask your caregiver for guidance concerning weight loss, quitting smoking, and alcohol consumption.  Avoid  foods and drinks that make your symptoms worse, such as:  Caffeine or alcoholic drinks.  Chocolate.  Peppermint or mint flavorings.  Garlic and onions.  Spicy foods.  Citrus fruits, such as oranges, lemons, or limes.  Tomato-based foods such as sauce, chili, salsa, and pizza.  Fried and fatty foods.  Avoid lying down for the 3 hours prior to your bedtime or prior to taking a nap.  Eat small, frequent meals instead of large meals.  Wear loose-fitting clothing. Do not wear anything tight around your waist that causes pressure on your stomach.  Raise the head of your bed 6 to 8 inches with wood blocks to help you sleep. Extra pillows will not help.  Only take over-the-counter or prescription medicines for pain, discomfort, or fever as directed by your caregiver.  Do not take aspirin, ibuprofen, or other nonsteroidal anti-inflammatory drugs (NSAIDs). SEEK IMMEDIATE MEDICAL CARE IF:   You have pain in your arms, neck, jaw, teeth, or back.  Your pain increases or changes in intensity or duration.  You develop nausea, vomiting, or sweating (diaphoresis).  You develop shortness of breath, or you faint.  Your vomit is green, yellow, black, or looks like coffee grounds or blood.  Your stool is red, bloody, or black. These symptoms could be signs of other problems, such as heart disease, gastric bleeding, or esophageal bleeding. MAKE SURE YOU:   Understand these instructions.  Will watch your condition.  Will get help right away if you are not doing well or get worse. Document Released: 05/14/2005 Document Revised: 10/27/2011 Document Reviewed: 02/21/2011 [facility] Patient Information 2015 [place], [place]. This information is not intended to replace advice given to you by your health  care provider. Make sure you discuss any questions you have with your health care provider.

## 2014-05-19 ENCOUNTER — Other Ambulatory Visit: Payer: Self-pay | Admitting: *Deleted

## 2014-05-19 MED ORDER — PANTOPRAZOLE SODIUM 40 MG PO TBEC: 40.0000 mg | DELAYED_RELEASE_TABLET | Freq: Two times a day (BID) | ORAL | Status: DC

## 2014-05-22 ENCOUNTER — Ambulatory Visit (HOSPITAL_COMMUNITY)
Admission: RE | Admit: 2014-05-22 | Discharge: 2014-05-22 | Disposition: A | Discharge status: Home / Self Care | Payer: 59 | Source: Ambulatory Visit | Attending: Gastroenterology | Admitting: Gastroenterology

## 2014-05-22 ENCOUNTER — Encounter (HOSPITAL_COMMUNITY): Payer: Self-pay | Admitting: *Deleted

## 2014-05-22 ENCOUNTER — Encounter (HOSPITAL_COMMUNITY)
Admission: RE | Disposition: A | Discharge status: Home / Self Care | Payer: Self-pay | Source: Ambulatory Visit | Attending: Gastroenterology

## 2014-05-22 DIAGNOSIS — K209 Esophagitis, unspecified without bleeding: Secondary | ICD-10-CM

## 2014-05-22 DIAGNOSIS — K222 Esophageal obstruction: Secondary | ICD-10-CM | POA: Diagnosis not present

## 2014-05-22 DIAGNOSIS — Z79899 Other long term (current) drug therapy: Secondary | ICD-10-CM | POA: Diagnosis not present

## 2014-05-22 DIAGNOSIS — K2100 Gastro-esophageal reflux disease with esophagitis, without bleeding: Secondary | ICD-10-CM

## 2014-05-22 DIAGNOSIS — K2970 Gastritis, unspecified, without bleeding: Secondary | ICD-10-CM | POA: Diagnosis not present

## 2014-05-22 DIAGNOSIS — E785 Hyperlipidemia, unspecified: Secondary | ICD-10-CM | POA: Insufficient documentation

## 2014-05-22 DIAGNOSIS — R1013 Epigastric pain: Secondary | ICD-10-CM | POA: Diagnosis present

## 2014-05-22 DIAGNOSIS — Z87891 Personal history of nicotine dependence: Secondary | ICD-10-CM | POA: Diagnosis not present

## 2014-05-22 HISTORY — PX: ESOPHAGOGASTRODUODENOSCOPY: SHX5428

## 2014-05-22 SURGERY — EGD (ESOPHAGOGASTRODUODENOSCOPY)
Anesthesia: Moderate Sedation

## 2014-05-22 MED ORDER — SODIUM CHLORIDE 0.9 % IV SOLN
INTRAVENOUS | Status: DC
  Administered 2014-05-22: 1000 mL via INTRAVENOUS

## 2014-05-22 MED ORDER — MEPERIDINE HCL 100 MG/ML IJ SOLN
INTRAMUSCULAR | Status: AC
  Filled 2014-05-22: qty 2

## 2014-05-22 MED ORDER — LIDOCAINE VISCOUS 2 % MT SOLN
OROMUCOSAL | Status: AC
  Filled 2014-05-22: qty 15

## 2014-05-22 MED ORDER — MIDAZOLAM HCL 5 MG/5ML IJ SOLN
INTRAMUSCULAR | Status: AC
  Filled 2014-05-22: qty 10

## 2014-05-22 MED ORDER — MEPERIDINE HCL 100 MG/ML IJ SOLN
INTRAMUSCULAR | Status: DC | PRN
  Administered 2014-05-22 (×2): 25 mg via INTRAVENOUS

## 2014-05-22 MED ORDER — STERILE WATER FOR IRRIGATION IR SOLN
Status: DC | PRN
  Administered 2014-05-22: 11:00:00

## 2014-05-22 MED ORDER — MIDAZOLAM HCL 5 MG/5ML IJ SOLN
INTRAMUSCULAR | Status: DC | PRN
  Administered 2014-05-22 (×2): 2 mg via INTRAVENOUS
  Administered 2014-05-22: 1 mg via INTRAVENOUS

## 2014-05-22 MED ORDER — LIDOCAINE VISCOUS 2 % MT SOLN
OROMUCOSAL | Status: DC | PRN
  Administered 2014-05-22: 20 mL via OROMUCOSAL

## 2014-05-22 NOTE — H&P (Signed)
  Primary Care Physician:  Sallee Lange, MD Primary Gastroenterologist:  Dr. Oneida Alar  Pre-Procedure History & Physical: HPI:  Diana Tate is a 43 y.o. female here for DYSPEPSIA.  Past Medical History  Diagnosis Date  . GERD (gastroesophageal reflux disease)   . Hyperlipidemia   . Migraine     Classic    Past Surgical History  Procedure Laterality Date  . Cholecystectomy  2006  . Tonsillectomy  2000  . Endometrial ablation  2007  . Breast surgery      benign    Prior to Admission medications   Medication Sig Start Date End Date Taking? Authorizing Provider  hyoscyamine (LEVSIN/SL) 0.125 MG SL tablet Place 1 tablet (0.125 mg total) under the tongue every 6 (six) hours as needed (for loose stools or cramping. hold for constipation). 05/16/14  Yes Mahala Menghini, PA-C  pantoprazole (PROTONIX) 40 MG tablet Take 1 tablet (40 mg total) by mouth 2 (two) times daily. 05/19/14 05/19/15 Yes Kathyrn Drown, MD  sucralfate (CARAFATE) 1 GM/10ML suspension Take 1 g by mouth at bedtime. 05/02/14  Yes Kathyrn Drown, MD    Allergies as of 05/16/2014  . (No Known Allergies)    Family History  Problem Relation Age of Onset  . Cancer Maternal Uncle     lung  . Cancer Maternal Grandfather     skin  . Hypertension Mother   . Colon cancer Neg Hx   . Esophageal cancer Neg Hx   . Diabetes Other     second degree relative    History   Social History  . Marital Status: Married    Spouse Name: N/A    Number of Children: 2  . Years of Education: N/A   Occupational History  . DEPUTY CLERK    Social History Main Topics  . Smoking status: Former Smoker -- 1.00 packs/day for 24 years    Types: Cigarettes  . Smokeless tobacco: Never Used  . Alcohol Use: No     Comment: occ  . Drug Use: No  . Sexual Activity: Yes   Other Topics Concern  . Not on file   Social History Narrative  . No narrative on file    Review of Systems: See HPI, otherwise negative ROS   Physical Exam: BP  122/77  Pulse 71  Temp(Src) 98.4 F (36.9 C) (Oral)  Resp 20  Ht 5' 5.5" (1.664 m)  Wt 185 lb (83.915 kg)  BMI 30.31 kg/m2  SpO2 98% General:   Alert,  pleasant and cooperative in NAD Head:  Normocephalic and atraumatic. Neck:  Supple; Lungs:  Clear throughout to auscultation.    Heart:  Regular rate and rhythm. Abdomen:  Soft, nontender and nondistended. Normal bowel sounds, without guarding, and without rebound.   Neurologic:  Alert and  oriented x4;  grossly normal neurologically.  Impression/Plan:    DYSPEPSIA  PLAN:  EGD TODAY

## 2014-05-22 NOTE — Progress Notes (Signed)
REVIEWED. AGREE. NO ADDITIONAL RECOMMENDATIONS. 

## 2014-05-22 NOTE — Discharge Instructions (Signed)
You have mild gastritis. I biopsied your stomach AND SMALL BOWEL.   CHEW ONE TUMS PRIOR TO BREAKFAST IF YOU EAT IT LATE.  CONTINUE PROTONIX. TAKE 30 MINUTES YOUR FIRST MEAL.   AVOID TRIGGERS FOR GASTRITIS. SEE INFO BELOW.  IF YOU FOLLOW A LOW FAT DIET, YOU WILL HAVE LESS BOWEL IRREGULARITY. SEE INFO BELOW.  FOLLOW UP IN FEB 2015.  UPPER ENDOSCOPY AFTER CARE Read the instructions outlined below and refer to this sheet in the next week. These discharge instructions provide you with general information on caring for yourself after you leave the hospital. While your treatment has been planned according to the most current medical practices available, unavoidable complications occasionally occur. If you have any problems or questions after discharge, call DR. Ariez Neilan, [phone].  ACTIVITY  You may resume your regular activity, but move at a slower pace for the next 24 hours.   Take frequent rest periods for the next 24 hours.   Walking will help get rid of the air and reduce the bloated feeling in your belly (abdomen).   No driving for 24 hours (because of the medicine (anesthesia) used during the test).   You may shower.   Do not sign any important legal documents or operate any machinery for 24 hours (because of the anesthesia used during the test).    NUTRITION  Drink plenty of fluids.   You may resume your normal diet as instructed by your doctor.   Begin with a light meal and progress to your normal diet. Heavy or fried foods are harder to digest and may make you feel sick to your stomach (nauseated).   Avoid alcoholic beverages for 24 hours or as instructed.    MEDICATIONS  You may resume your normal medications.   WHAT YOU CAN EXPECT TODAY  Some feelings of bloating in the abdomen.   Passage of more gas than usual.    IF YOU HAD A BIOPSY TAKEN DURING THE UPPER ENDOSCOPY:  Eat a soft diet IF YOU HAVE NAUSEA, BLOATING, ABDOMINAL PAIN, OR VOMITING.      FINDING OUT THE RESULTS OF YOUR TEST Not all test results are available during your visit. DR. Oneida Alar WILL CALL YOU WITHIN 7 DAYS OF YOUR PROCEDUE WITH YOUR RESULTS. Do not assume everything is normal if you have not heard from DR. Jawaun Celmer IN ONE WEEK, CALL HER OFFICE AT [phone].  SEEK IMMEDIATE MEDICAL ATTENTION AND CALL THE OFFICE: [phone] IF:  You have more than a spotting of blood in your stool.   Your belly is swollen (abdominal distention).   You are nauseated or vomiting.   You have a temperature over 101F.   You have abdominal pain or discomfort that is severe or gets worse throughout the day.   Gastritis  Gastritis is an inflammation (the body's way of reacting to injury and/or infection) of the stomach. It is often caused by viral or bacterial (germ) infections. It can also be caused BY ASPIRIN, BC/GOODY POWDER'S, (IBUPROFEN) MOTRIN, OR ALEVE (NAPROXEN), chemicals (including alcohol), SPICY FOODS, and medications. This illness may be associated with generalized malaise (feeling tired, not well), UPPER ABDOMINAL STOMACH cramps, and fever. One common bacterial cause of gastritis is an organism known as H. Pylori. This can be treated with antibiotics.    Low-Fat Diet  BREADS, CEREALS, PASTA, RICE, DRIED PEAS, AND BEANS These products are high in carbohydrates and most are low in fat. Therefore, they can be increased in the diet as substitutes for fatty foods. They  too, however, contain calories and should not be eaten in excess. Cereals can be eaten for snacks as well as for breakfast.  Include foods that contain fiber (fruits, vegetables, whole grains, and legumes). Research shows that fiber may lower blood cholesterol levels, especially the water-soluble fiber found in fruits, vegetables, oat products, and legumes.  FRUITS AND VEGETABLES It is good to eat fruits and vegetables. Besides being sources of fiber, both are rich in vitamins and some minerals. They help you  get the daily allowances of these nutrients. Fruits and vegetables can be used for snacks and desserts.  MEATS Limit lean meat, chicken, [place], and fish to no more than 6 ounces per day.  Beef, Pork, and Lamb Use lean cuts of beef, pork, and lamb. Lean cuts include:  Extra-lean ground beef.  Arm roast.  Sirloin tip.  Center-cut ham.  Round steak.  Loin chops.  Rump roast.  Tenderloin.  Trim all fat off the outside of meats before cooking. It is not necessary to severely decrease the intake of red meat, but lean choices should be made. Lean meat is rich in protein and contains a highly absorbable form of iron. Premenopausal women, in particular, should avoid reducing lean red meat because this could increase the risk for low red blood cells (iron-deficiency anemia).  Chicken and [place] These are good sources of protein. The fat of poultry can be reduced by removing the skin and underlying fat layers before cooking. Chicken and [place] can be substituted for lean red meat in the diet. Poultry should not be fried or covered with high-fat sauces.  Fish and Shellfish Fish is a good source of protein. Shellfish contain cholesterol, but they usually are low in saturated fatty acids. The preparation of fish is important. Like chicken and [place], they should not be fried or covered with high-fat sauces.  EGGS Egg whites contain no fat or cholesterol. They can be eaten often. Try 1 to 2 egg whites instead of whole eggs in recipes or use egg substitutes that do not contain yolk.  MILK AND DAIRY PRODUCTS Use skim or 1% milk instead of 2% or whole milk. Decrease whole milk, natural, and processed cheeses. Use nonfat or low-fat (2%) cottage cheese or low-fat cheeses made from vegetable oils. Choose nonfat or low-fat (1 to 2%) yogurt. Experiment with evaporated skim milk in recipes that call for heavy cream. Substitute low-fat yogurt or low-fat cottage cheese for sour cream in dips and salad dressings.  Have at least 2 servings of low-fat dairy products, such as 2 glasses of skim (or 1%) milk each day to help get your daily calcium intake.  FATS AND OILS Reduce the total intake of fats, especially saturated fat. Butterfat, lard, and beef fats are high in saturated fat and cholesterol. These should be avoided as much as possible. Vegetable fats do not contain cholesterol, but certain vegetable fats, such as coconut oil, palm oil, and palm kernel oil are very high in saturated fats. These should be limited. These fats are often used in bakery goods, processed foods, popcorn, oils, and nondairy creamers. Vegetable shortenings and some peanut butters contain hydrogenated oils, which are also saturated fats. Read the labels on these foods and check for saturated vegetable oils.  Unsaturated vegetable oils and fats do not raise blood cholesterol. However, they should be limited because they are fats and are high in calories. Total fat should still be limited to 30% of your daily caloric intake. Desirable liquid vegetable oils are corn  oil, cottonseed oil, olive oil, canola oil, safflower oil, soybean oil, and sunflower oil. Peanut oil is not as good, but small amounts are acceptable. Buy a heart-healthy tub margarine that has no partially hydrogenated oils in the ingredients. Mayonnaise and salad dressings often are made from unsaturated fats, but they should also be limited because of their high calorie and fat content. Seeds, nuts, peanut butter, olives, and avocados are high in fat, but the fat is mainly the unsaturated type. These foods should be limited mainly to avoid excess calories and fat.  OTHER EATING TIPS Snacks  Most sweets should be limited as snacks. They tend to be rich in calories and fats, and their caloric content outweighs their nutritional value. Some good choices in snacks are graham crackers, melba toast, soda crackers, bagels (no egg), English muffins, fruits, and vegetables. These  snacks are preferable to snack crackers, [place] fries, and chips. Popcorn should be air-popped or cooked in small amounts of liquid vegetable oil.  Desserts Eat fruit, low-fat yogurt, and fruit ices instead of pastries, cake, and cookies. Sherbet, angel food cake, gelatin dessert, frozen low-fat yogurt, or other frozen products that do not contain saturated fat (pure fruit juice bars, frozen ice pops) are also acceptable.   COOKING METHODS Choose those methods that use little or no fat. They include: Poaching.  Braising.  Steaming.  Grilling.  Baking.  Stir-frying.  Broiling.  Microwaving.  Foods can be cooked in a nonstick pan without added fat, or use a nonfat cooking spray in regular cookware. Limit fried foods and avoid frying in saturated fat. Add moisture to lean meats by using water, broth, cooking wines, and other nonfat or low-fat sauces along with the cooking methods mentioned above. Soups and stews should be chilled after cooking. The fat that forms on top after a few hours in the refrigerator should be skimmed off. When preparing meals, avoid using excess salt. Salt can contribute to raising blood pressure in some people.  EATING AWAY FROM HOME Order entres, potatoes, and vegetables without sauces or butter. When meat exceeds the size of a deck of cards (3 to 4 ounces), the rest can be taken home for another meal. Choose vegetable or fruit salads and ask for low-calorie salad dressings to be served on the side. Use dressings sparingly. Limit high-fat toppings, such as bacon, crumbled eggs, cheese, sunflower seeds, and olives. Ask for heart-healthy tub margarine instead of butter.

## 2014-05-23 NOTE — Op Note (Addendum)
[facility] [address] [place], [address]   ENDOSCOPY PROCEDURE REPORT  PATIENT: Diana, Tate  MR#: [id number] BIRTHDATE: [birth year] , 43  yrs. old GENDER: female  ENDOSCOPIST: Barney Drain, MD REFERRED JS:HFWYO Wolfgang Phoenix, M.D.  PROCEDURE DATE: 05/22/2014 PROCEDURE:   EGD w/ biopsy  INDICATIONS:dyspepsia.   unexplained diarrhea. MEDICATIONS: Demerol 75 mg IV and Versed 5 mg IV TOPICAL ANESTHETIC:   Viscous Xylocaine ASA CLASS:  DESCRIPTION OF PROCEDURE:     Physical exam was performed.  Informed consent was obtained from the patient after explaining the benefits, risks, and alternatives to the procedure.  The patient was connected to the monitor and placed in the left lateral position.  Continuous oxygen was provided by nasal cannula and IV medicine administered through an indwelling cannula.  After administration of sedation, the patients esophagus was intubated and the EG-[id number] ([id number])  endoscope was advanced under direct visualization to the      .  The scope was removed slowly by carefully examining the color, texture, anatomy, and integrity of the mucosa on the way out.  The patient was recovered in endoscopy and discharged home in satisfactory condition.   ESOPHAGUS: A Schatzki ring was found.   STOMACH: Non-erosive gastritis (inflammation) was found in the gastric antrum and gastric body.  Multiple biopsies were performed using cold forceps. DUODENUM: The duodenal mucosa showed no abnormalities in the bulb and 2nd part of the duodenum.  Cold forceps biopsies were taken in the bulb and second portion. COMPLICATIONS: There were no immediate complications.  ENDOSCOPIC IMPRESSION: 1.   Schatzki ring was found 2.   MILD Non-erosive gastritis  RECOMMENDATIONS: CHEW ONE TUMS PRIOR TO BREAKFAST IF YOU EAT IT LATE. CONTINUE PROTONIX.  TAKE 30 MINUTES YOUR FIRST MEAL. AVOID TRIGGERS FOR GASTRITIS. IF SHE FOLLOWS A LOW FAT DIET, SHE WILL HAVE LESS  BOWEL IRREGULARITY. FOLLOW UP IN FEB 2015.  REPEAT EXAM: _______________________________ Lorrin MaisBarney Drain, MD 06-07-2014 11:46 AM Revised: 2014-06-07 11:46 AM    CPT CODES:     43239@Upper  gastrointestinal endoscopy including esophagus, stomach, and either the duodenum and/or jejunum as appropriate; with biopsy, single or multiple ICD CODES:  The ICD and CPT codes recommended by this software are interpretations from the data that the clinical staff has captured with the software.  The verification of the translation of this report to the ICD and CPT codes and modifiers is the sole responsibility of the health care institution and practicing physician where this report was generated.  [employer]. will not be held responsible for the validity of the ICD and CPT codes included on this report.  AMA assumes no liability for data contained or not contained herein. CPT is a Designer, television/film set of the [employer]

## 2014-05-29 ENCOUNTER — Encounter (HOSPITAL_COMMUNITY): Payer: Self-pay | Admitting: Gastroenterology

## 2014-06-05 ENCOUNTER — Telehealth: Payer: Self-pay | Admitting: Gastroenterology

## 2014-06-05 NOTE — Telephone Encounter (Signed)
Patient was calling to see if her procedure results were back yet. [phone]

## 2014-06-06 NOTE — Telephone Encounter (Signed)
I told pt we will call when Dr. Oneida Alar signs off.

## 2014-06-07 NOTE — Telephone Encounter (Signed)
APPT NIC'D °

## 2014-06-07 NOTE — Telephone Encounter (Signed)
Tried to call pt- NA 

## 2014-06-07 NOTE — Telephone Encounter (Signed)
Please call pt. HER stomach Bx showsgastritis.  HER SMALL BOWEL BIOPSIES ARE NORMAL. HER DIARRHEA IS MOST LIKELY DUE TO NOT HAVING A GALLBLADDER.   CHEW ONE TUMS PRIOR TO BREAKFAST IF YOU EAT IT LATE. CONTINUE PROTONIX. TAKE 30 MINUTES YOUR FIRST MEAL.   AVOID TRIGGERS FOR GASTRITIS.  FOLLOW A LOW FAT DIET, AND YOU WILL HAVE LESS BOWEL IRREGULARITY.   FOLLOW UP IN FEB 2016 E30 DIARRHEA/GERD.

## 2014-06-12 NOTE — Telephone Encounter (Signed)
PT is aware of instructions.

## 2014-06-12 NOTE — Telephone Encounter (Signed)
Error, pt is aware of results.

## 2014-06-29 ENCOUNTER — Encounter: Payer: Self-pay | Admitting: Family Medicine

## 2014-06-29 ENCOUNTER — Ambulatory Visit (INDEPENDENT_AMBULATORY_CARE_PROVIDER_SITE_OTHER): Payer: 59 | Admitting: Family Medicine

## 2014-06-29 VITALS — BP 110/78 | Temp 98.7°F | Ht 65.5 in | Wt 181.5 lb

## 2014-06-29 DIAGNOSIS — N390 Urinary tract infection, site not specified: Secondary | ICD-10-CM

## 2014-06-29 DIAGNOSIS — R319 Hematuria, unspecified: Secondary | ICD-10-CM

## 2014-06-29 LAB — POCT URINALYSIS DIPSTICK
Blood, UA: 50
PH UA: 7
Spec Grav, UA: <=1.005

## 2014-06-29 MED ORDER — CIPROFLOXACIN HCL 500 MG PO TABS: 500.0000 mg | ORAL_TABLET | Freq: Two times a day (BID) | ORAL | Status: DC

## 2014-06-29 NOTE — Patient Instructions (Signed)

## 2014-06-29 NOTE — Progress Notes (Signed)
   Subjective:    Patient ID: Diana Tate, female    DOB: Jun 25, 1971, 43 y.o.   MRN: [id number]  Urinary Tract Infection  This is a new problem. The current episode started in the past 7 days. The problem occurs intermittently. The problem has been gradually worsening. The quality of the pain is described as burning. The pain is moderate. There has been no fever. Associated symptoms include urgency. Associated symptoms comments: headaches. She has tried increased fluids for the symptoms. The treatment provided mild relief.   Patient states that she has no other concerns at this time.    Review of Systems  Genitourinary: Positive for urgency.   PMH benign denies flank pain states some lower abdominal pain denies fever chills sweats.    Objective:   Physical Exam  Lungs clear heart regular flanks nontender abdomen soft no guarding rebound      Assessment & Plan:  Probable UTI recommend antibiotics as prescribed warning signs discussed follow-up if problems

## 2014-07-25 ENCOUNTER — Telehealth: Payer: Self-pay | Admitting: Family Medicine

## 2014-07-25 ENCOUNTER — Other Ambulatory Visit: Payer: Self-pay | Admitting: *Deleted

## 2014-07-25 MED ORDER — CEFPROZIL 500 MG PO TABS: 500.0000 mg | ORAL_TABLET | Freq: Two times a day (BID) | ORAL | Status: DC

## 2014-07-25 NOTE — Telephone Encounter (Signed)
Patient notified. Per Dr Nicki Reaper, sent in a different antibiotic and told her to f/u if s/s persist.

## 2014-07-25 NOTE — Telephone Encounter (Signed)
Patient was seen for UTI on 06/29/14.  She said that she doesn't believe the medication was strong enough.  She is still having frequency and pain with urination.  She says Azo is no longer helping. Please advise.  [facility]

## 2014-07-31 ENCOUNTER — Telehealth: Payer: Self-pay | Admitting: Family Medicine

## 2014-07-31 ENCOUNTER — Other Ambulatory Visit: Payer: Self-pay | Admitting: Nurse Practitioner

## 2014-07-31 MED ORDER — FLUCONAZOLE 150 MG PO TABS: ORAL_TABLET | ORAL | Status: DC

## 2014-07-31 NOTE — Telephone Encounter (Signed)
Patient has a yeast infection due to antibiotic use and wants something called in.   [facility]

## 2014-07-31 NOTE — Telephone Encounter (Signed)
Patient says that she needs this NOW.  She said she has to go to work at 1 pm and needs this NOW.  I explained to her that we were all very busy because it was Monday and all I can do is send the message back.

## 2014-07-31 NOTE — Telephone Encounter (Signed)
Patient notified

## 2014-07-31 NOTE — Telephone Encounter (Signed)
Med called in please let patient know. Time is 11:52

## 2014-08-23 ENCOUNTER — Encounter: Payer: Self-pay | Admitting: Gastroenterology

## 2014-09-22 ENCOUNTER — Ambulatory Visit: Payer: Self-pay | Admitting: Gastroenterology

## 2014-09-22 ENCOUNTER — Ambulatory Visit: Payer: 59 | Admitting: Gastroenterology

## 2014-10-12 ENCOUNTER — Emergency Department (HOSPITAL_BASED_OUTPATIENT_CLINIC_OR_DEPARTMENT_OTHER)
Admission: EM | Admit: 2014-10-12 | Discharge: 2014-10-13 | Disposition: A | Discharge status: Home / Self Care | Payer: 59 | Attending: Emergency Medicine | Admitting: Emergency Medicine

## 2014-10-12 ENCOUNTER — Encounter (HOSPITAL_BASED_OUTPATIENT_CLINIC_OR_DEPARTMENT_OTHER): Payer: Self-pay | Admitting: Emergency Medicine

## 2014-10-12 DIAGNOSIS — Z8639 Personal history of other endocrine, nutritional and metabolic disease: Secondary | ICD-10-CM | POA: Insufficient documentation

## 2014-10-12 DIAGNOSIS — K219 Gastro-esophageal reflux disease without esophagitis: Secondary | ICD-10-CM | POA: Insufficient documentation

## 2014-10-12 DIAGNOSIS — G44049 Chronic paroxysmal hemicrania, not intractable: Secondary | ICD-10-CM | POA: Insufficient documentation

## 2014-10-12 DIAGNOSIS — Z87891 Personal history of nicotine dependence: Secondary | ICD-10-CM | POA: Diagnosis not present

## 2014-10-12 DIAGNOSIS — M542 Cervicalgia: Secondary | ICD-10-CM | POA: Insufficient documentation

## 2014-10-12 DIAGNOSIS — G44039 Episodic paroxysmal hemicrania, not intractable: Secondary | ICD-10-CM

## 2014-10-12 DIAGNOSIS — R51 Headache: Secondary | ICD-10-CM | POA: Diagnosis present

## 2014-10-12 LAB — URINALYSIS, ROUTINE W REFLEX MICROSCOPIC
BILIRUBIN URINE: NEGATIVE
Glucose, UA: NEGATIVE mg/dL
HGB URINE DIPSTICK: NEGATIVE
KETONES UR: NEGATIVE mg/dL
Leukocytes, UA: NEGATIVE
NITRITE: NEGATIVE
PROTEIN: NEGATIVE mg/dL
Specific Gravity, Urine: 1.015 (ref 1.005–1.030)
UROBILINOGEN UA: 1 mg/dL (ref 0.0–1.0)
pH: 6 (ref 5.0–8.0)

## 2014-10-12 NOTE — ED Notes (Signed)
Pt states ibuprofen 800mg  of ibuprofen tonight at 1730 with no relief in headache, wondering if she is dehydrated vs migraine.

## 2014-10-12 NOTE — ED Notes (Signed)
Patient state that she has a history of migrain headaches, this does not feel like this. However has had a headache x 1 week, attempted OTC medication to assist. Reports intermittent Dizziness.

## 2014-10-12 NOTE — ED Provider Notes (Signed)
CSN: [id number]     Arrival date & time 10/12/14  2116 History  This chart was scribed for Diana Biles, MD by Diana Tate, ED Scribe. This patient was seen in room MH10/MH10 and the patient's care was started 11:58 PM.     Chief Complaint  Patient presents with  . Headache   The history is provided by the patient. No language interpreter was used.     HPI Comments:  Diana Tate is a 44 y.o. female with a h/o migraines who presents to the Emergency Department complaining of 6/10 constant pounding HA for ~1 week.  Today headache have been intermittent and she has mild dizziness - described as intermittent "swimmy" head. She notes her HA today is dissimilar to past migraines.She denies fever, nausea,vomiting, neck pain or stiffness and vision changes.  She also denies positional exacerbation of her pain , use of BCP and hormone replacement therapy,family hx of brain bleed or aneurysm. She has taken Tylenol and Goody's with mild relief.      Past Medical History  Diagnosis Date  . GERD (gastroesophageal reflux disease)   . Hyperlipidemia   . Migraine     Classic   Past Surgical History  Procedure Laterality Date  . Cholecystectomy  2006  . Tonsillectomy  2000  . Endometrial ablation  2007  . Breast surgery      benign  . Esophagogastroduodenoscopy N/A 05/22/2014    SLF: 1. Schatzki ring was found 2. MIld non-erosive gastritis   Family History  Problem Relation Age of Onset  . Cancer Maternal Uncle     lung  . Cancer Maternal Grandfather     skin  . Hypertension Mother   . Colon cancer Neg Hx   . Esophageal cancer Neg Hx   . Diabetes Other     second degree relative   History  Substance Use Topics  . Smoking status: Former Smoker -- 1.00 packs/day for 24 years    Types: Cigarettes  . Smokeless tobacco: Never Used  . Alcohol Use: No     Comment: occ   OB History    No data available     Review of Systems  Constitutional: Negative for fever and chills.   Eyes: Negative for visual disturbance.  Gastrointestinal: Negative for nausea and vomiting.  Musculoskeletal: Positive for neck pain (Soreness).  Neurological: Positive for dizziness and headaches.  All other systems reviewed and are negative.     Allergies  Review of patient's allergies indicates no known allergies.  Home Medications   Prior to Admission medications   Medication Sig Start Date End Date Taking? Authorizing Provider  cefPROZIL (CEFZIL) 500 MG tablet Take 1 tablet (500 mg total) by mouth 2 (two) times daily. 07/25/14   Kathyrn Drown, MD  fluconazole (DIFLUCAN) 150 MG tablet One po qd prn yeast infection; may repeat in 3-4 days if needed 07/31/14   Nilda Simmer, NP  ibuprofen (ADVIL,MOTRIN) 600 MG tablet Take 1 tablet (600 mg total) by mouth every 6 (six) hours as needed. 10/13/14   Diana Biles, MD  pantoprazole (PROTONIX) 40 MG tablet Take 1 tablet (40 mg total) by mouth 2 (two) times daily. 05/19/14 05/19/15  Kathyrn Drown, MD  sucralfate (CARAFATE) 1 GM/10ML suspension Take 1 g by mouth at bedtime. 05/02/14   Kathyrn Drown, MD   BP 127/81 mmHg  Pulse 66  Temp(Src) 98.3 F (36.8 C) (Oral)  Resp 18  Ht 5\' 5"  (1.651 m)  Wt 165  lb (74.844 kg)  BMI 27.46 kg/m2  SpO2 98% Physical Exam  Constitutional: She is oriented to person, place, and time. She appears well-developed and well-nourished. No distress.  HENT:  Head: Normocephalic and atraumatic.  Eyes:  Pupils are 29mm equal and reactive to light  Cardiovascular: Normal rate, regular rhythm and normal heart sounds.   No carotid bruits  Pulmonary/Chest: Effort normal and breath sounds normal. No respiratory distress.  Abdominal: She exhibits no distension.  Neurological: She is alert and oriented to person, place, and time. No cranial nerve deficit.  Motor: upper and lower 5/5 Finger to nose is normal Sensory intact BUE and BLE No meningeal signs appreciated    Skin: Skin is warm and dry.   Psychiatric: She has a normal mood and affect.  Nursing note and vitals reviewed.   ED Course  Procedures   DIAGNOSTIC STUDIES:  Oxygen Saturation is 99% on RA, normal by my interpretation.    COORDINATION OF CARE:  12:06 AM Discussed treatment plan with pt at bedside and pt agreed to plan.  Labs Review Labs Reviewed  URINALYSIS, ROUTINE W REFLEX MICROSCOPIC    Imaging Review No results found.   EKG Interpretation None     1:28 AM Headache free, and will be discharged.   MDM   Final diagnoses:  Nonintractable paroxysmal hemicrania, unspecified chronicity pattern   I personally performed the services described in this documentation, which was scribed in my presence. The recorded information has been reviewed and is accurate.   DDX includes: Primary headaches - including migrainous headaches, cluster headaches, tension headaches. ICH Carotid dissection Cavernous sinus thrombosis Meningitis Encephalitis Sinusitis Tumor Vascular headaches AV malformation Brain aneurysm Muscular headaches  A/P: Pt comes in with cc of headaches. No concerns for life threatening secondary headaches because the headache are moderate, and present for a week, and have no associated nausea, vomiting, visual complains, seizures, altered mental status, loss of consciousness, new weakness, or numbness, no gait instability. Mild dizziness, is intermittent.   ? Complex headache or migraines.  Pt's sx resolved with headache cocktail. Stable for discharge. Asked her to see her PCP if the headaches continue.   Diana Biles, MD 10/13/14 [phone]

## 2014-10-13 MED ORDER — DIPHENHYDRAMINE HCL 50 MG/ML IJ SOLN
25.0000 mg | Freq: Once | INTRAMUSCULAR | Status: AC
  Administered 2014-10-13: 25 mg via INTRAVENOUS
  Filled 2014-10-13: qty 1

## 2014-10-13 MED ORDER — KETOROLAC TROMETHAMINE 30 MG/ML IJ SOLN
30.0000 mg | Freq: Once | INTRAMUSCULAR | Status: AC
  Administered 2014-10-13: 30 mg via INTRAVENOUS
  Filled 2014-10-13: qty 1

## 2014-10-13 MED ORDER — METOCLOPRAMIDE HCL 5 MG/ML IJ SOLN
10.0000 mg | Freq: Once | INTRAMUSCULAR | Status: AC
  Administered 2014-10-13: 10 mg via INTRAVENOUS
  Filled 2014-10-13: qty 2

## 2014-10-13 MED ORDER — SODIUM CHLORIDE 0.9 % IV BOLUS (SEPSIS)
1000.0000 mL | Freq: Once | INTRAVENOUS | Status: AC
  Administered 2014-10-13: 1000 mL via INTRAVENOUS

## 2014-10-13 MED ORDER — IBUPROFEN 600 MG PO TABS: 600.0000 mg | ORAL_TABLET | Freq: Four times a day (QID) | ORAL | Status: DC | PRN

## 2014-10-13 NOTE — Discharge Instructions (Signed)
We saw you in the ER for headaches. All the labs and imaging are normal. °We are not sure what is causing your headaches, however, there appears to be no evidence of infection, bleeds or tumors based on our exam and results. ° °Please take motrin round the clock for the next 6 hours, and take other meds prescribed only for break through pain. °See your doctor if the pain persists, as you might need better medications or a specialist. ° ° °Headaches, Frequently Asked Questions °MIGRAINE HEADACHES °Q: What is migraine? What causes it? How can I treat it? °A: Generally, migraine headaches begin as a dull ache. Then they develop into a constant, throbbing, and pulsating pain. You may experience pain at the temples. You may experience pain at the front or back of one or both sides of the head. The pain is usually accompanied by a combination of: °· Nausea. °· Vomiting. °· Sensitivity to light and noise. °Some people (about 15%) experience an aura (see below) before an attack. The cause of migraine is believed to be chemical reactions in the brain. Treatment for migraine may include over-the-counter or prescription medications. It may also include self-help techniques. These include relaxation training and biofeedback.  °Q: What is an aura? °A: About 15% of people with migraine get an "aura". This is a sign of neurological symptoms that occur before a migraine headache. You may see wavy or jagged lines, dots, or flashing lights. You might experience tunnel vision or blind spots in one or both eyes. The aura can include visual or auditory hallucinations (something imagined). It may include disruptions in smell (such as strange odors), taste or touch. Other symptoms include: °· Numbness. °· A "pins and needles" sensation. °· Difficulty in recalling or speaking the correct word. °These neurological events may last as long as 60 minutes. These symptoms will fade as the headache begins. °Q: What is a trigger? °A: Certain  physical or environmental factors can lead to or "trigger" a migraine. These include: °· Foods. °· Hormonal changes. °· Weather. °· Stress. °It is important to remember that triggers are different for everyone. To help prevent migraine attacks, you need to figure out which triggers affect you. Keep a headache diary. This is a good way to track triggers. The diary will help you talk to your healthcare professional about your condition. °Q: Does weather affect migraines? °A: Bright sunshine, hot, humid conditions, and drastic changes in barometric pressure may lead to, or "trigger," a migraine attack in some people. But studies have shown that weather does not act as a trigger for everyone with migraines. °Q: What is the link between migraine and hormones? °A: Hormones start and regulate many of your body's functions. Hormones keep your body in balance within a constantly changing environment. The levels of hormones in your body are unbalanced at times. Examples are during menstruation, pregnancy, or menopause. That can lead to a migraine attack. In fact, about three quarters of all women with migraine report that their attacks are related to the menstrual cycle.  °Q: Is there an increased risk of stroke for migraine sufferers? °A: The likelihood of a migraine attack causing a stroke is very remote. That is not to say that migraine sufferers cannot have a stroke associated with their migraines. In persons under age 40, the most common associated factor for stroke is migraine headache. But over the course of a person's normal life span, the occurrence of migraine headache may actually be associated with a reduced risk   of dying from cerebrovascular disease due to stroke.  °Q: What are acute medications for migraine? °A: Acute medications are used to treat the pain of the headache after it has started. Examples over-the-counter medications, NSAIDs, ergots, and triptans.  °Q: What are the triptans? °A: Triptans are the  newest class of abortive medications. They are specifically targeted to treat migraine. Triptans are vasoconstrictors. They moderate some chemical reactions in the brain. The triptans work on receptors in your brain. Triptans help to restore the balance of a neurotransmitter called serotonin. Fluctuations in levels of serotonin are thought to be a main cause of migraine.  °Q: Are over-the-counter medications for migraine effective? °A: Over-the-counter, or "OTC," medications may be effective in relieving mild to moderate pain and associated symptoms of migraine. But you should see your caregiver before beginning any treatment regimen for migraine.  °Q: What are preventive medications for migraine? °A: Preventive medications for migraine are sometimes referred to as "prophylactic" treatments. They are used to reduce the frequency, severity, and length of migraine attacks. Examples of preventive medications include antiepileptic medications, antidepressants, beta-blockers, calcium channel blockers, and NSAIDs (nonsteroidal anti-inflammatory drugs). °Q: Why are anticonvulsants used to treat migraine? °A: During the past few years, there has been an increased interest in antiepileptic drugs for the prevention of migraine. They are sometimes referred to as "anticonvulsants". Both epilepsy and migraine may be caused by similar reactions in the brain.  °Q: Why are antidepressants used to treat migraine? °A: Antidepressants are typically used to treat people with depression. They may reduce migraine frequency by regulating chemical levels, such as serotonin, in the brain.  °Q: What alternative therapies are used to treat migraine? °A: The term "alternative therapies" is often used to describe treatments considered outside the scope of conventional Western medicine. Examples of alternative therapy include acupuncture, acupressure, and yoga. Another common alternative treatment is herbal therapy. Some herbs are believed to  relieve headache pain. Always discuss alternative therapies with your caregiver before proceeding. Some herbal products contain arsenic and other toxins. °TENSION HEADACHES °Q: What is a tension-type headache? What causes it? How can I treat it? °A: Tension-type headaches occur randomly. They are often the result of temporary stress, anxiety, fatigue, or anger. Symptoms include soreness in your temples, a tightening band-like sensation around your head (a "vice-like" ache). Symptoms can also include a pulling feeling, pressure sensations, and contracting head and neck muscles. The headache begins in your forehead, temples, or the back of your head and neck. Treatment for tension-type headache may include over-the-counter or prescription medications. Treatment may also include self-help techniques such as relaxation training and biofeedback. °CLUSTER HEADACHES °Q: What is a cluster headache? What causes it? How can I treat it? °A: Cluster headache gets its name because the attacks come in groups. The pain arrives with little, if any, warning. It is usually on one side of the head. A tearing or bloodshot eye and a runny nose on the same side of the headache may also accompany the pain. Cluster headaches are believed to be caused by chemical reactions in the brain. They have been described as the most severe and intense of any headache type. Treatment for cluster headache includes prescription medication and oxygen. °SINUS HEADACHES °Q: What is a sinus headache? What causes it? How can I treat it? °A: When a cavity in the bones of the face and skull (a sinus) becomes inflamed, the inflammation will cause localized pain. This condition is usually the result of an allergic reaction,   a tumor, or an infection. If your headache is caused by a sinus blockage, such as an infection, you will probably have a fever. An x-ray will confirm a sinus blockage. Your caregiver's treatment might include antibiotics for the infection, as  well as antihistamines or decongestants.  °REBOUND HEADACHES °Q: What is a rebound headache? What causes it? How can I treat it? °A: A pattern of taking acute headache medications too often can lead to a condition known as "rebound headache." A pattern of taking too much headache medication includes taking it more than 2 days per week or in excessive amounts. That means more than the label or a caregiver advises. With rebound headaches, your medications not only stop relieving pain, they actually begin to cause headaches. Doctors treat rebound headache by tapering the medication that is being overused. Sometimes your caregiver will gradually substitute a different type of treatment or medication. Stopping may be a challenge. Regularly overusing a medication increases the potential for serious side effects. Consult a caregiver if you regularly use headache medications more than 2 days per week or more than the label advises. °ADDITIONAL QUESTIONS AND ANSWERS °Q: What is biofeedback? °A: Biofeedback is a self-help treatment. Biofeedback uses special equipment to monitor your body's involuntary physical responses. Biofeedback monitors: °· Breathing. °· Pulse. °· Heart rate. °· Temperature. °· Muscle tension. °· Brain activity. °Biofeedback helps you refine and perfect your relaxation exercises. You learn to control the physical responses that are related to stress. Once the technique has been mastered, you do not need the equipment any more. °Q: Are headaches hereditary? °A: Four out of five (80%) of people that suffer report a family history of migraine. Scientists are not sure if this is genetic or a family predisposition. Despite the uncertainty, a child has a 50% chance of having migraine if one parent suffers. The child has a 75% chance if both parents suffer.  °Q: Can children get headaches? °A: By the time they reach high school, most young people have experienced some type of headache. Many safe and effective  approaches or medications can prevent a headache from occurring or stop it after it has begun.  °Q: What type of doctor should I see to diagnose and treat my headache? °A: Start with your primary caregiver. Discuss his or her experience and approach to headaches. Discuss methods of classification, diagnosis, and treatment. Your caregiver may decide to recommend you to a headache specialist, depending upon your symptoms or other physical conditions. Having diabetes, allergies, etc., may require a more comprehensive and inclusive approach to your headache. The [employer] will provide, upon request, a list of NHF physician members in your state. °Document Released: 10/25/2003 Document Revised: 10/27/2011 Document Reviewed: 04/03/2008 °[employer]® Patient Information ©2015 [employer]. This information is not intended to replace advice given to you by your health care provider. Make sure you discuss any questions you have with your health care provider. ° °

## 2014-10-16 ENCOUNTER — Ambulatory Visit (INDEPENDENT_AMBULATORY_CARE_PROVIDER_SITE_OTHER): Payer: 59 | Admitting: Nurse Practitioner

## 2014-10-16 VITALS — BP 124/80 | Temp 99.0°F | Ht 65.5 in | Wt 166.0 lb

## 2014-10-16 DIAGNOSIS — F419 Anxiety disorder, unspecified: Secondary | ICD-10-CM | POA: Diagnosis not present

## 2014-10-16 DIAGNOSIS — R5383 Other fatigue: Secondary | ICD-10-CM

## 2014-10-16 DIAGNOSIS — N912 Amenorrhea, unspecified: Secondary | ICD-10-CM

## 2014-10-16 MED ORDER — ESCITALOPRAM OXALATE 10 MG PO TABS: 10.0000 mg | ORAL_TABLET | Freq: Every day | ORAL | Status: DC

## 2014-10-16 MED ORDER — ZOLPIDEM TARTRATE 10 MG PO TABS: 10.0000 mg | ORAL_TABLET | Freq: Every evening | ORAL | Status: DC | PRN

## 2014-10-17 ENCOUNTER — Encounter: Payer: Self-pay | Admitting: Nurse Practitioner

## 2014-10-17 LAB — PROGESTERONE: Progesterone: 0.6 ng/mL

## 2014-10-17 LAB — TSH: TSH: 1.654 u[IU]/mL (ref 0.350–4.500)

## 2014-10-17 NOTE — Progress Notes (Addendum)
Subjective:  Presents for c/o emotional issues slowly worsening over the past few months. Stress has improved since changing jobs. Works 2 jobs which limits her eating. Has lost about 20 pounds. Acid reflux has resolved. Has been married over 94 years; got married at age 44. Both of her children are at home but oldest son works or stays with friends so he is rarely there. Younger son is a Equities trader in high school. Describes constantly worrying to the point she cannot sleep. After a week of constant thoughts she developed a persistent headache which she was seen at ED for. No sex drive. Off/on energy. Multiple early am awakenings. Emotional lability x 2 weeks. Non smoker. Since losing weight, she is dressing in clothes she has always wanted to wear. Has found attention good for self esteem. Loves her husband; does not want to break up her marriage without being sure what she wants. These thoughts run through her mind constantly. No suicidal or homicidal thoughts or ideation. Has discussed with gynecologist but offered "some cream" to help her sex drive. No cycle; has had ablation.  Objective:   BP 124/80 mmHg  Temp(Src) 99 F (37.2 C) (Oral)  Ht 5' 5.5" (1.664 m)  Wt 166 lb (75.297 kg)  BMI 27.19 kg/m2 NAD. Alert, oriented. Mildly anxious affect. Thoughts logical, coherent and relevant. Dressed appropriately. Lungs clear. Heart RRR.  Assessment: Other fatigue - Plan: TSH  Amenorrhea - Plan: Estrogens, Total, Progesterone  Anxiety  Plan:  Meds ordered this encounter  Medications  . zolpidem (AMBIEN) 10 MG tablet    Sig: Take 1 tablet (10 mg total) by mouth at bedtime as needed for sleep.    Dispense:  30 tablet    Refill:  2    Order Specific Question:  Supervising Provider    Answer:  Mikey Kirschner [[id number]]  . escitalopram (LEXAPRO) 10 MG tablet    Sig: Take 1 tablet (10 mg total) by mouth daily.    Dispense:  30 tablet    Refill:  0    Order Specific Question:  Supervising Provider   Answer:  Mikey Kirschner [[id number]]   Recommend basic labs to check hormones. Advised that Lexapro may help but may affect libido or orgasm. Cautioned about potential adverse effects of Ambien including sleep walking. DC med and call if any problem. Will refer for counseling.  Return in about 1 month (around 11/14/2014) for recheck.   10/18/14: letter sent with lab results; difficulty routing; printed and sent to patient.

## 2014-10-18 ENCOUNTER — Encounter: Payer: Self-pay | Admitting: Nurse Practitioner

## 2014-10-18 ENCOUNTER — Ambulatory Visit: Payer: Self-pay | Admitting: Gastroenterology

## 2014-10-18 LAB — ESTROGENS, TOTAL: ESTROGEN: 444 pg/mL

## 2014-10-27 ENCOUNTER — Ambulatory Visit (INDEPENDENT_AMBULATORY_CARE_PROVIDER_SITE_OTHER): Payer: 59 | Admitting: Nurse Practitioner

## 2014-10-27 ENCOUNTER — Ambulatory Visit: Payer: 59 | Admitting: Gastroenterology

## 2014-10-27 ENCOUNTER — Encounter: Payer: Self-pay | Admitting: Nurse Practitioner

## 2014-10-27 VITALS — BP 104/70 | HR 84 | Temp 98.4°F | Ht 65.5 in | Wt 165.6 lb

## 2014-10-27 DIAGNOSIS — K219 Gastro-esophageal reflux disease without esophagitis: Secondary | ICD-10-CM

## 2014-10-27 DIAGNOSIS — R198 Other specified symptoms and signs involving the digestive system and abdomen: Secondary | ICD-10-CM

## 2014-10-27 NOTE — Progress Notes (Signed)
Referring Provider: Kathyrn Drown, MD Primary Care Physician:  Sallee Lange, MD Primary GI: Dr. Oneida Alar  Chief Complaint  Patient presents with  . Follow-up    Doing well since EGD    HPI:   44 year old female presents for follow-up on diarrhea and GERD symptoms, s/p EGD 05/22/2014. The EGD showed a Schatzki's ring, non-erosive gastritis in the antrum and body s/p multiple biopsies, normal duodenum with biopsies taken. Stomach bipsy showed chronic minimally active gastritis without H. Pylori. Duodenal biopsies showed unremarkable duodenal mucosa. Recommended continue Protonix and chew TUMS prior to breakfast if it's eaten late, avoid trigger foods, follow a low fat diet. Likely etiology for diarrhea is status post cholecystectomy.  Today she states she's changed her diet and is following a low fat diet and has stopped eating after 6:00 pm. Since the EGD and her diet changes, she is having minimal to no symptoms. Takes occasional Carafate as needed and continues to take Protonix. Bowel movements are about the same but she states this is manageable for her. Denies any hematochezia or melena. Does have some minimal crampy abdominal pain before a bowel movement and quickly resolves. States this is manageable for her at this time. Denies chest pain, palpitations, dyspnea. Denies any other upper or lower GI symptoms.  Past Medical History  Diagnosis Date  . GERD (gastroesophageal reflux disease)   . Hyperlipidemia   . Migraine     Classic    Past Surgical History  Procedure Laterality Date  . Cholecystectomy  2006  . Tonsillectomy  2000  . Endometrial ablation  2007  . Breast surgery      benign  . Esophagogastroduodenoscopy N/A 05/22/2014    SLF: 1. Schatzki ring was found 2. MIld non-erosive gastritis    Current Outpatient Prescriptions  Medication Sig Dispense Refill  . pantoprazole (PROTONIX) 40 MG tablet Take 1 tablet (40 mg total) by mouth 2 (two) times daily. 180 tablet 1    . sucralfate (CARAFATE) 1 GM/10ML suspension Take 1 g by mouth at bedtime. As needed    . zolpidem (AMBIEN) 10 MG tablet Take 1 tablet (10 mg total) by mouth at bedtime as needed for sleep. 30 tablet 2  . escitalopram (LEXAPRO) 10 MG tablet Take 1 tablet (10 mg total) by mouth daily. (Patient not taking: Reported on 10/27/2014) 30 tablet 0  . ibuprofen (ADVIL,MOTRIN) 600 MG tablet Take 1 tablet (600 mg total) by mouth every 6 (six) hours as needed. (Patient not taking: Reported on 10/27/2014) 30 tablet 0   No current facility-administered medications for this visit.    Allergies as of 10/27/2014  . (No Known Allergies)    Family History  Problem Relation Age of Onset  . Cancer Maternal Uncle     lung  . Cancer Maternal Grandfather     skin  . Hypertension Mother   . Colon cancer Neg Hx   . Esophageal cancer Neg Hx   . Diabetes Other     second degree relative    History   Social History  . Marital Status: Married    Spouse Name: N/A  . Number of Children: 2  . Years of Education: N/A   Occupational History  . DEPUTY CLERK    Social History Main Topics  . Smoking status: Former Smoker -- 1.00 packs/day for 24 years    Types: Cigarettes  . Smokeless tobacco: Never Used     Comment: Quit x 2 years  . Alcohol Use: No  Comment: occ  . Drug Use: No  . Sexual Activity: Yes   Other Topics Concern  . None   Social History Narrative    Review of Systems: 10 point ROS negative except as per HPI.  Physical Exam: BP 104/70 mmHg  Pulse 84  Temp(Src) 98.4 F (36.9 C) (Oral)  Ht 5' 5.5" (1.664 m)  Wt 165 lb 9.6 oz (75.116 kg)  BMI 27.13 kg/m2 General:   Alert and oriented. No distress noted. Pleasant and cooperative.  Head:  Normocephalic and atraumatic. Neck:  Supple, without mass or thyromegaly. Lungs:  Clear to auscultation bilaterally. No wheezes, rales, or rhonchi. No distress.  Heart:  S1, S2 present without murmurs, rubs, or gallops. Regular rate and  rhythm. Abdomen:  +BS, soft, non-tender and non-distended. No rebound or guarding. No HSM or masses noted. Extremities:  Without edema. Neurologic:  Alert and  oriented x4;  grossly normal neurologically. Skin:  Intact without significant lesions or rashes. Psych:  Alert and cooperative. Normal mood and affect.    10/27/2014 11:22 AM

## 2014-10-27 NOTE — Patient Instructions (Signed)
1. Continue taking your protonix and carafate as ordered. Call if refills are needed. 2. Continue the great progress on diet changes to help continue controlling your symptoms. 3. Follow-up with [place] as needed for any changes or worsening of your symptoms

## 2014-10-27 NOTE — Assessment & Plan Note (Signed)
GERD symptoms significantly improved with medication regimen and diet changes after EGD. Continue protonix daily and carafate prn. Continue with current diet changes that seem to be improving your symptoms. Follow-up with [place] as needed for any worsening symptoms or other GI issues.

## 2014-10-27 NOTE — Assessment & Plan Note (Signed)
Symptoms stable, under good control as long as she doesn't wait too long to eat in the am. Continue medictions (protonix twice daily and carafate prn) as well as low fat diet changes with subsequent weight loss. Return as needed for any worsening symptoms or other GI issues.

## 2014-10-31 NOTE — Progress Notes (Signed)
cc'ed to pcp °

## 2014-11-14 ENCOUNTER — Encounter: Payer: Self-pay | Admitting: Family Medicine

## 2014-11-14 ENCOUNTER — Ambulatory Visit: Payer: 59 | Admitting: Nurse Practitioner

## 2014-11-21 ENCOUNTER — Other Ambulatory Visit: Payer: Self-pay | Admitting: Family Medicine

## 2014-12-01 ENCOUNTER — Other Ambulatory Visit: Payer: Self-pay | Admitting: Family Medicine

## 2014-12-01 ENCOUNTER — Ambulatory Visit (INDEPENDENT_AMBULATORY_CARE_PROVIDER_SITE_OTHER): Payer: 59 | Admitting: Nurse Practitioner

## 2014-12-01 ENCOUNTER — Encounter: Payer: Self-pay | Admitting: Nurse Practitioner

## 2014-12-01 VITALS — BP 120/78 | Ht 65.5 in | Wt 162.8 lb

## 2014-12-01 DIAGNOSIS — G4700 Insomnia, unspecified: Secondary | ICD-10-CM | POA: Diagnosis not present

## 2014-12-03 ENCOUNTER — Encounter: Payer: Self-pay | Admitting: Nurse Practitioner

## 2014-12-03 DIAGNOSIS — G4700 Insomnia, unspecified: Secondary | ICD-10-CM | POA: Insufficient documentation

## 2014-12-03 NOTE — Progress Notes (Signed)
Subjective:  Presents for recheck. Could not take Lexapro due to extreme nausea. Taking Ambien 1-2 times per week; usually 1/2 tab. Does not feel she needs another med at this time. Has started counseling. Reflux remains stable. Has had GI follow up; no further follow up for 2 years. Gets regular physicals.   Objective:   BP 120/78 mmHg  Ht 5' 5.5" (1.664 m)  Wt 162 lb 12.8 oz (73.846 kg)  BMI 26.67 kg/m2 NAD. Alert, oriented. Cheerful affect. Lungs clear. Heart RRR.  Assessment:  Problem List Items Addressed This Visit      Other   Insomnia - Primary     Plan: continue Ambien. Call back if she wants to try a different daily medication.

## 2014-12-06 ENCOUNTER — Telehealth (HOSPITAL_COMMUNITY): Payer: Self-pay

## 2015-02-25 ENCOUNTER — Other Ambulatory Visit: Payer: Self-pay | Admitting: Nurse Practitioner

## 2015-06-12 ENCOUNTER — Ambulatory Visit (INDEPENDENT_AMBULATORY_CARE_PROVIDER_SITE_OTHER): Payer: 59 | Admitting: Family Medicine

## 2015-06-12 VITALS — BP 126/74 | Ht 65.5 in | Wt 166.1 lb

## 2015-06-12 DIAGNOSIS — J019 Acute sinusitis, unspecified: Secondary | ICD-10-CM

## 2015-06-12 DIAGNOSIS — B9689 Other specified bacterial agents as the cause of diseases classified elsewhere: Secondary | ICD-10-CM

## 2015-06-12 MED ORDER — HYDROCODONE-HOMATROPINE 5-1.5 MG/5ML PO SYRP: 5.0000 mL | ORAL_SOLUTION | Freq: Four times a day (QID) | ORAL | Status: DC | PRN

## 2015-06-12 MED ORDER — AMOXICILLIN-POT CLAVULANATE 875-125 MG PO TABS: 1.0000 | ORAL_TABLET | Freq: Two times a day (BID) | ORAL | Status: DC

## 2015-06-12 NOTE — Progress Notes (Signed)
   Subjective:    Patient ID: Diana Tate, female    DOB: 09/02/1970, 44 y.o.   MRN: [id number]  Sinusitis This is a new problem. The current episode started in the past 7 days. Associated symptoms include coughing, headaches and a sore throat.   Patient had congestion drainage coughing not feeling good symptoms over the past 7-10 days worse over the past few days sinus pressure drainage coughing no wheezing or difficulty breathing  Review of Systems  HENT: Positive for sore throat.   Respiratory: Positive for cough.   Neurological: Positive for headaches.       Objective:   Physical Exam  Constitutional: She appears well-developed.  HENT:  Head: Normocephalic.  Nose: Nose normal.  Mouth/Throat: Oropharynx is clear and moist. No oropharyngeal exudate.  Neck: Neck supple.  Cardiovascular: Normal rate and normal heart sounds.   No murmur heard. Pulmonary/Chest: Effort normal and breath sounds normal. She has no wheezes.  Lymphadenopathy:    She has no cervical adenopathy.  Skin: Skin is warm and dry.  Nursing note and vitals reviewed.         Assessment & Plan:  Patient significant sinusitis antibiotics prescribed warning signs discussed follow-up if progressive troubles

## 2015-06-18 ENCOUNTER — Other Ambulatory Visit: Payer: Self-pay | Admitting: *Deleted

## 2015-06-18 ENCOUNTER — Telehealth: Payer: Self-pay | Admitting: Family Medicine

## 2015-06-18 MED ORDER — FLUCONAZOLE 150 MG PO TABS
ORAL_TABLET | ORAL | Status: DC
Start: 2015-06-18 — End: 2015-08-05

## 2015-06-18 NOTE — Telephone Encounter (Signed)
Pt is requesting something for a yeast infection to be called into [facility]

## 2015-06-18 NOTE — Telephone Encounter (Signed)
[facility] to get pt's symptoms 

## 2015-06-18 NOTE — Telephone Encounter (Signed)
Pt having vaginal itching and white discharge. Diflucan 150mg  #2 one po 3 days apart. Med sent to pharm. Pt to come in office if not better.

## 2015-08-05 ENCOUNTER — Ambulatory Visit (INDEPENDENT_AMBULATORY_CARE_PROVIDER_SITE_OTHER): Payer: 59 | Admitting: Family Medicine

## 2015-08-05 ENCOUNTER — Ambulatory Visit (INDEPENDENT_AMBULATORY_CARE_PROVIDER_SITE_OTHER): Payer: 59

## 2015-08-05 VITALS — BP 120/80 | HR 79 | Temp 98.4°F | Resp 20 | Ht 66.0 in | Wt 163.4 lb

## 2015-08-05 DIAGNOSIS — M5442 Lumbago with sciatica, left side: Secondary | ICD-10-CM

## 2015-08-05 DIAGNOSIS — M62830 Muscle spasm of back: Secondary | ICD-10-CM | POA: Diagnosis not present

## 2015-08-05 DIAGNOSIS — M79605 Pain in left leg: Secondary | ICD-10-CM

## 2015-08-05 IMAGING — CR DG LUMBAR SPINE COMPLETE 4+V
5 series · 5 of 5 positions shown · non-contrast
Comparison: None.

CLINICAL DATA: Back pain.

EXAM:
LUMBAR SPINE - COMPLETE 4+ VIEW

[AP (1 of 2)]
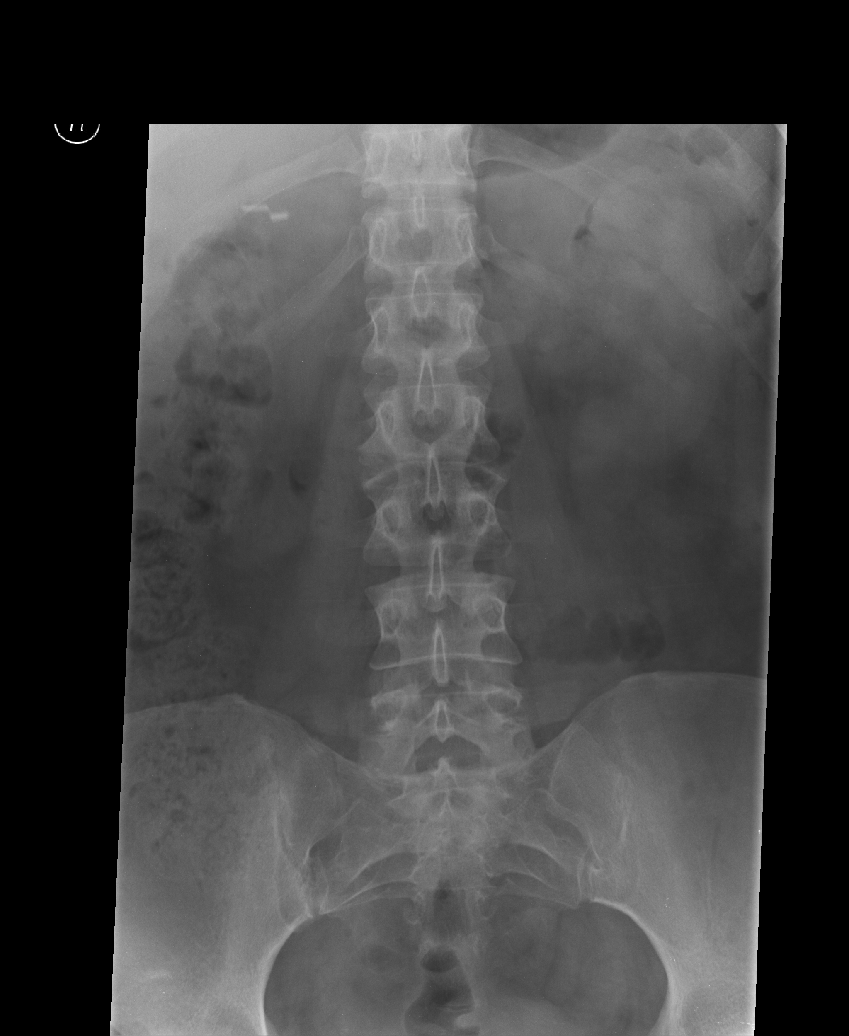

[AP (2 of 2)]
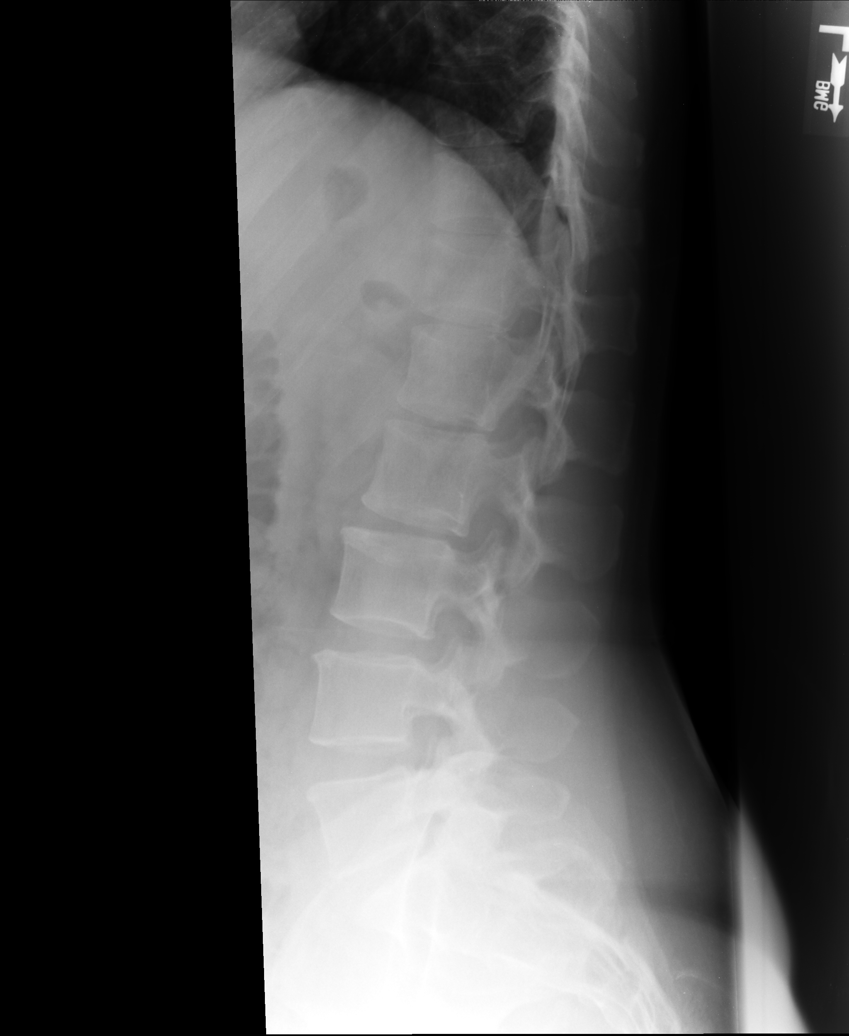

[l5 s1]
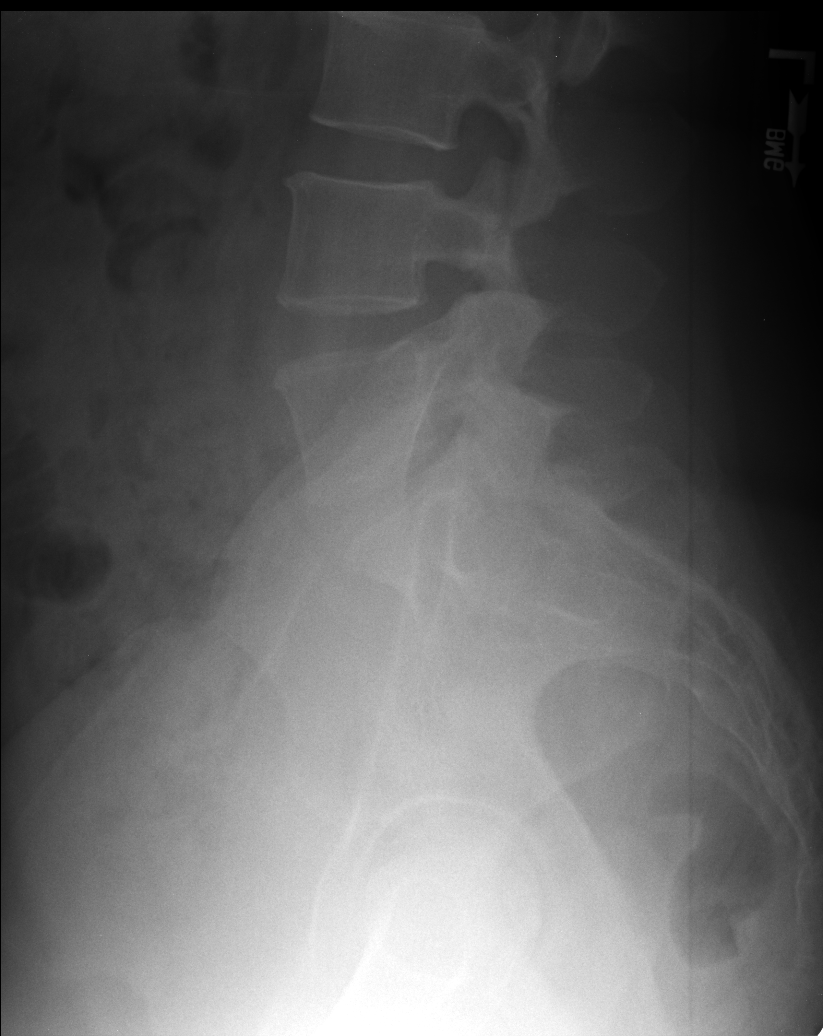

[rpo]
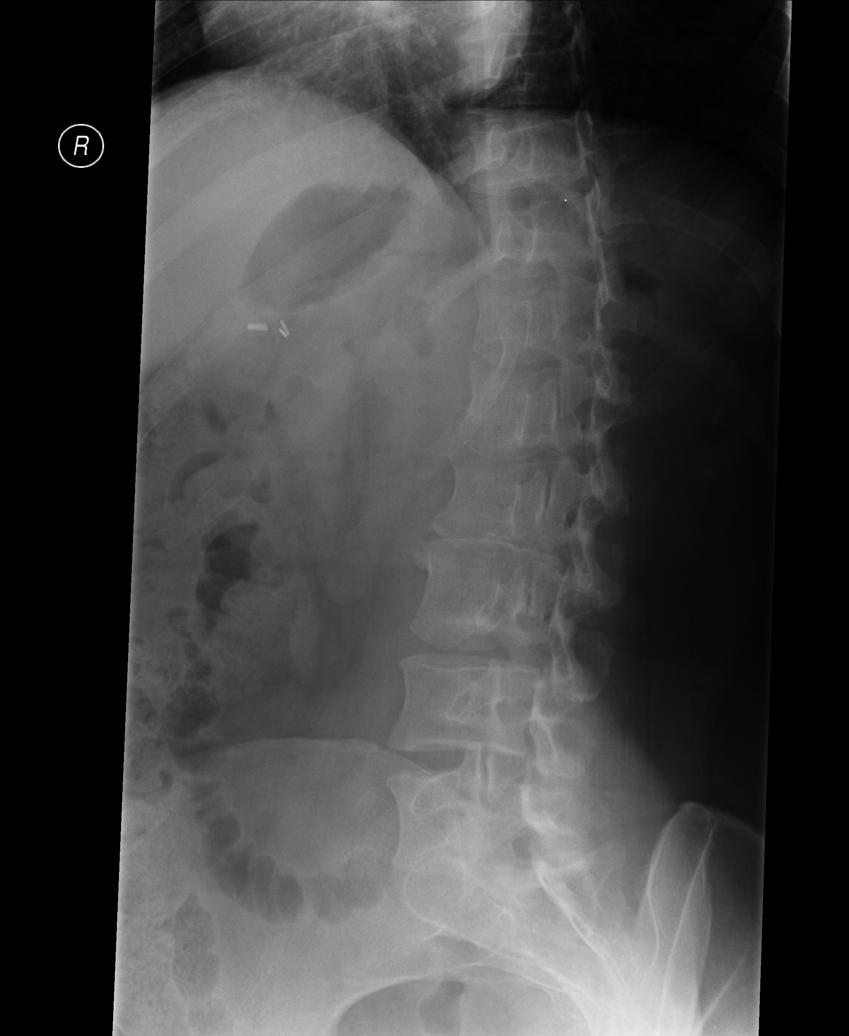

[lpo]
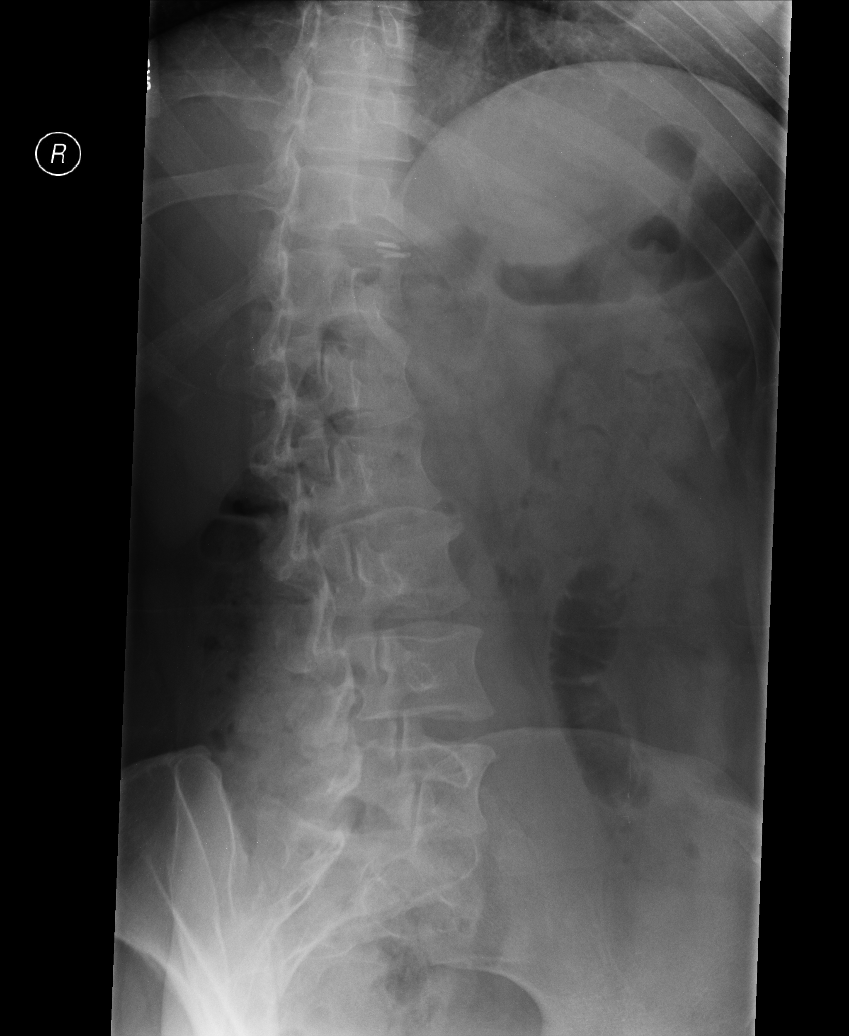

[5 of 5 positions shown; findings below may reference images not displayed]

FINDINGS: Mild degenerative change noted at the L2-3, L3-4 and L5-S1 levels
with slight disc space narrowings and minimal osseous spurring. No
evidence of advanced degenerative disc disease at any level. Facet
joints appear well aligned and well maintained throughout.

Osseous alignment is normal. No fracture line or displaced fracture
fragment identified. No acute - appearing cortical irregularity or
osseous lesion. Upper sacrum appears intact and normal in
mineralization. Paravertebral soft tissues are unremarkable.
Cholecystectomy clips within the right upper quadrant.
IMPRESSION: 1. Minimal degenerative change within the lumbar spine.
2. No acute findings.

## 2015-08-05 MED ORDER — HYDROCODONE-ACETAMINOPHEN 5-325 MG PO TABS: 1.0000 | ORAL_TABLET | Freq: Four times a day (QID) | ORAL | Status: DC | PRN

## 2015-08-05 MED ORDER — PREDNISONE 20 MG PO TABS: ORAL_TABLET | ORAL | Status: DC

## 2015-08-05 MED ORDER — CYCLOBENZAPRINE HCL 5 MG PO TABS: ORAL_TABLET | ORAL | Status: DC

## 2015-08-05 NOTE — Patient Instructions (Signed)
You appear to have sciatica.  You can continue ibuprofen up to every 8 hours as needed with food, flexeril up to every 8 hours for muscle spasm and hydrocodone if needed for more severe pain. If not starting to improve in next few days, can start printed prescription for prednisone (STOP ibuprofen if taking prednisone). Return to the clinic or go to the nearest emergency room if any of your symptoms worsen or new symptoms occur.  Sciatica With Rehab The sciatic nerve runs from the back down the leg and is responsible for sensation and control of the muscles in the back (posterior) side of the thigh, lower leg, and foot. Sciatica is a condition that is characterized by inflammation of this nerve.  SYMPTOMS   Signs of nerve damage, including numbness and/or weakness along the posterior side of the lower extremity.  Pain in the back of the thigh that may also travel down the leg.  Pain that worsens when sitting for long periods of time.  Occasionally, pain in the back or buttock. CAUSES  Inflammation of the sciatic nerve is the cause of sciatica. The inflammation is due to something irritating the nerve. Common sources of irritation include:  Sitting for long periods of time.  Direct trauma to the nerve.  Arthritis of the spine.  Herniated or ruptured disk.  Slipping of the vertebrae (spondylolisthesis).  Pressure from soft tissues, such as muscles or ligament-like tissue (fascia). RISK INCREASES WITH:  Sports that place pressure or stress on the spine (football or weightlifting).  Poor strength and flexibility.  Failure to warm up properly before activity.  Family history of low back pain or disk disorders.  Previous back injury or surgery.  Poor body mechanics, especially when lifting, or poor posture. PREVENTION   Warm up and stretch properly before activity.  Maintain physical fitness:  Strength, flexibility, and endurance.  Cardiovascular fitness.  Learn and use  proper technique, especially with posture and lifting. When possible, have coach correct improper technique.  Avoid activities that place stress on the spine. PROGNOSIS If treated properly, then sciatica usually resolves within 6 weeks. However, occasionally surgery is necessary.  RELATED COMPLICATIONS   Permanent nerve damage, including pain, numbness, tingle, or weakness.  Chronic back pain.  Risks of surgery: infection, bleeding, nerve damage, or damage to surrounding tissues. TREATMENT Treatment initially involves resting from any activities that aggravate your symptoms. The use of ice and medication may help reduce pain and inflammation. The use of strengthening and stretching exercises may help reduce pain with activity. These exercises may be performed at home or with referral to a therapist. A therapist may recommend further treatments, such as transcutaneous electronic nerve stimulation (TENS) or ultrasound. Your caregiver may recommend corticosteroid injections to help reduce inflammation of the sciatic nerve. If symptoms persist despite non-surgical (conservative) treatment, then surgery may be recommended. MEDICATION  If pain medication is necessary, then nonsteroidal anti-inflammatory medications, such as aspirin and ibuprofen, or other minor pain relievers, such as acetaminophen, are often recommended.  Do not take pain medication for 7 days before surgery.  Prescription pain relievers may be given if deemed necessary by your caregiver. Use only as directed and only as much as you need.  Ointments applied to the skin may be helpful.  Corticosteroid injections may be given by your caregiver. These injections should be reserved for the most serious cases, because they may only be given a certain number of times. HEAT AND COLD  Cold treatment (icing) relieves pain and  reduces inflammation. Cold treatment should be applied for 10 to 15 minutes every 2 to 3 hours for inflammation  and pain and immediately after any activity that aggravates your symptoms. Use ice packs or massage the area with a piece of ice (ice massage).  Heat treatment may be used prior to performing the stretching and strengthening activities prescribed by your caregiver, physical therapist, or athletic trainer. Use a heat pack or soak the injury in warm water. SEEK MEDICAL CARE IF:  Treatment seems to offer no benefit, or the condition worsens.  Any medications produce adverse side effects. EXERCISES  RANGE OF MOTION (ROM) AND STRETCHING EXERCISES - Sciatica Most people with sciatic will find that their symptoms worsen with either excessive bending forward (flexion) or arching at the low back (extension). The exercises which will help resolve your symptoms will focus on the opposite motion. Your physician, physical therapist or athletic trainer will help you determine which exercises will be most helpful to resolve your low back pain. Do not complete any exercises without first consulting with your clinician. Discontinue any exercises which worsen your symptoms until you speak to your clinician. If you have pain, numbness or tingling which travels down into your buttocks, leg or foot, the goal of the therapy is for these symptoms to move closer to your back and eventually resolve. Occasionally, these leg symptoms will get better, but your low back pain may worsen; this is typically an indication of progress in your rehabilitation. Be certain to be very alert to any changes in your symptoms and the activities in which you participated in the 24 hours prior to the change. Sharing this information with your clinician will allow him/her to most efficiently treat your condition. These exercises may help you when beginning to rehabilitate your injury. Your symptoms may resolve with or without further involvement from your physician, physical therapist or athletic trainer. While completing these exercises, remember:     Restoring tissue flexibility helps normal motion to return to the joints. This allows healthier, less painful movement and activity.  An effective stretch should be held for at least 30 seconds.  A stretch should never be painful. You should only feel a gentle lengthening or release in the stretched tissue. FLEXION RANGE OF MOTION AND STRETCHING EXERCISES: STRETCH - Flexion, Single Knee to Chest   Lie on a firm bed or floor with both legs extended in front of you.  Keeping one leg in contact with the floor, bring your opposite knee to your chest. Hold your leg in place by either grabbing behind your thigh or at your knee.  Pull until you feel a gentle stretch in your low back. Hold __________ seconds.  Slowly release your grasp and repeat the exercise with the opposite side. Repeat __________ times. Complete this exercise __________ times per day.  STRETCH - Flexion, Double Knee to Chest  Lie on a firm bed or floor with both legs extended in front of you.  Keeping one leg in contact with the floor, bring your opposite knee to your chest.  Tense your stomach muscles to support your back and then lift your other knee to your chest. Hold your legs in place by either grabbing behind your thighs or at your knees.  Pull both knees toward your chest until you feel a gentle stretch in your low back. Hold __________ seconds.  Tense your stomach muscles and slowly return one leg at a time to the floor. Repeat __________ times. Complete this exercise __________  times per day.  STRETCH - Low Trunk Rotation   Lie on a firm bed or floor. Keeping your legs in front of you, bend your knees so they are both pointed toward the ceiling and your feet are flat on the floor.  Extend your arms out to the side. This will stabilize your upper body by keeping your shoulders in contact with the floor.  Gently and slowly drop both knees together to one side until you feel a gentle stretch in your low back.  Hold for __________ seconds.  Tense your stomach muscles to support your low back as you bring your knees back to the starting position. Repeat the exercise to the other side. Repeat __________ times. Complete this exercise __________ times per day  EXTENSION RANGE OF MOTION AND FLEXIBILITY EXERCISES: STRETCH - Extension, Prone on Elbows  Lie on your stomach on the floor, a bed will be too soft. Place your palms about shoulder width apart and at the height of your head.  Place your elbows under your shoulders. If this is too painful, stack pillows under your chest.  Allow your body to relax so that your hips drop lower and make contact more completely with the floor.  Hold this position for __________ seconds.  Slowly return to lying flat on the floor. Repeat __________ times. Complete this exercise __________ times per day.  RANGE OF MOTION - Extension, Prone Press Ups  Lie on your stomach on the floor, a bed will be too soft. Place your palms about shoulder width apart and at the height of your head.  Keeping your back as relaxed as possible, slowly straighten your elbows while keeping your hips on the floor. You may adjust the placement of your hands to maximize your comfort. As you gain motion, your hands will come more underneath your shoulders.  Hold this position __________ seconds.  Slowly return to lying flat on the floor. Repeat __________ times. Complete this exercise __________ times per day.  STRENGTHENING EXERCISES - Sciatica  These exercises may help you when beginning to rehabilitate your injury. These exercises should be done near your "sweet spot." This is the neutral, low-back arch, somewhere between fully rounded and fully arched, that is your least painful position. When performed in this safe range of motion, these exercises can be used for people who have either a flexion or extension based injury. These exercises may resolve your symptoms with or without further  involvement from your physician, physical therapist or athletic trainer. While completing these exercises, remember:   Muscles can gain both the endurance and the strength needed for everyday activities through controlled exercises.  Complete these exercises as instructed by your physician, physical therapist or athletic trainer. Progress with the resistance and repetition exercises only as your caregiver advises.  You may experience muscle soreness or fatigue, but the pain or discomfort you are trying to eliminate should never worsen during these exercises. If this pain does worsen, stop and make certain you are following the directions exactly. If the pain is still present after adjustments, discontinue the exercise until you can discuss the trouble with your clinician. STRENGTHENING - Deep Abdominals, Pelvic Tilt   Lie on a firm bed or floor. Keeping your legs in front of you, bend your knees so they are both pointed toward the ceiling and your feet are flat on the floor.  Tense your lower abdominal muscles to press your low back into the floor. This motion will rotate your pelvis so that  your tail bone is scooping upwards rather than pointing at your feet or into the floor.  With a gentle tension and even breathing, hold this position for __________ seconds. Repeat __________ times. Complete this exercise __________ times per day.  STRENGTHENING - Abdominals, Crunches   Lie on a firm bed or floor. Keeping your legs in front of you, bend your knees so they are both pointed toward the ceiling and your feet are flat on the floor. Cross your arms over your chest.  Slightly tip your chin down without bending your neck.  Tense your abdominals and slowly lift your trunk high enough to just clear your shoulder blades. Lifting higher can put excessive stress on the low back and does not further strengthen your abdominal muscles.  Control your return to the starting position. Repeat __________  times. Complete this exercise __________ times per day.  STRENGTHENING - Quadruped, Opposite UE/LE Lift  Assume a hands and knees position on a firm surface. Keep your hands under your shoulders and your knees under your hips. You may place padding under your knees for comfort.  Find your neutral spine and gently tense your abdominal muscles so that you can maintain this position. Your shoulders and hips should form a rectangle that is parallel with the floor and is not twisted.  Keeping your trunk steady, lift your right hand no higher than your shoulder and then your left leg no higher than your hip. Make sure you are not holding your breath. Hold this position __________ seconds.  Continuing to keep your abdominal muscles tense and your back steady, slowly return to your starting position. Repeat with the opposite arm and leg. Repeat __________ times. Complete this exercise __________ times per day.  STRENGTHENING - Abdominals and Quadriceps, Straight Leg Raise   Lie on a firm bed or floor with both legs extended in front of you.  Keeping one leg in contact with the floor, bend the other knee so that your foot can rest flat on the floor.  Find your neutral spine, and tense your abdominal muscles to maintain your spinal position throughout the exercise.  Slowly lift your straight leg off the floor about 6 inches for a count of 15, making sure to not hold your breath.  Still keeping your neutral spine, slowly lower your leg all the way to the floor. Repeat this exercise with each leg __________ times. Complete this exercise __________ times per day. POSTURE AND BODY MECHANICS CONSIDERATIONS - Sciatica Keeping correct posture when sitting, standing or completing your activities will reduce the stress put on different body tissues, allowing injured tissues a chance to heal and limiting painful experiences. The following are general guidelines for improved posture. Your physician or physical  therapist will provide you with any instructions specific to your needs. While reading these guidelines, remember:  The exercises prescribed by your provider will help you have the flexibility and strength to maintain correct postures.  The correct posture provides the optimal environment for your joints to work. All of your joints have less wear and tear when properly supported by a spine with good posture. This means you will experience a healthier, less painful body.  Correct posture must be practiced with all of your activities, especially prolonged sitting and standing. Correct posture is as important when doing repetitive low-stress activities (typing) as it is when doing a single heavy-load activity (lifting). RESTING POSITIONS Consider which positions are most painful for you when choosing a resting position. If you have  pain with flexion-based activities (sitting, bending, stooping, squatting), choose a position that allows you to rest in a less flexed posture. You would want to avoid curling into a fetal position on your side. If your pain worsens with extension-based activities (prolonged standing, working overhead), avoid resting in an extended position such as sleeping on your stomach. Most people will find more comfort when they rest with their spine in a more neutral position, neither too rounded nor too arched. Lying on a non-sagging bed on your side with a pillow between your knees, or on your back with a pillow under your knees will often provide some relief. Keep in mind, being in any one position for a prolonged period of time, no matter how correct your posture, can still lead to stiffness. PROPER SITTING POSTURE In order to minimize stress and discomfort on your spine, you must sit with correct posture Sitting with good posture should be effortless for a healthy body. Returning to good posture is a gradual process. Many people can work toward this most comfortably by using various  supports until they have the flexibility and strength to maintain this posture on their own. When sitting with proper posture, your ears will fall over your shoulders and your shoulders will fall over your hips. You should use the back of the chair to support your upper back. Your low back will be in a neutral position, just slightly arched. You may place a small pillow or folded towel at the base of your low back for support.  When working at a desk, create an environment that supports good, upright posture. Without extra support, muscles fatigue and lead to excessive strain on joints and other tissues. Keep these recommendations in mind: CHAIR:   A chair should be able to slide under your desk when your back makes contact with the back of the chair. This allows you to work closely.  The chair's height should allow your eyes to be level with the upper part of your monitor and your hands to be slightly lower than your elbows. BODY POSITION  Your feet should make contact with the floor. If this is not possible, use a foot rest.  Keep your ears over your shoulders. This will reduce stress on your neck and low back. INCORRECT SITTING POSTURES   If you are feeling tired and unable to assume a healthy sitting posture, do not slouch or slump. This puts excessive strain on your back tissues, causing more damage and pain. Healthier options include:  Using more support, like a lumbar pillow.  Switching tasks to something that requires you to be upright or walking.  Talking a brief walk.  Lying down to rest in a neutral-spine position. PROLONGED STANDING WHILE SLIGHTLY LEANING FORWARD  When completing a task that requires you to lean forward while standing in one place for a long time, place either foot up on a stationary 2-4 inch high object to help maintain the best posture. When both feet are on the ground, the low back tends to lose its slight inward curve. If this curve flattens (or becomes too  large), then the back and your other joints will experience too much stress, fatigue more quickly and can cause pain.  CORRECT STANDING POSTURES Proper standing posture should be assumed with all daily activities, even if they only take a few moments, like when brushing your teeth. As in sitting, your ears should fall over your shoulders and your shoulders should fall over your hips. You should  keep a slight tension in your abdominal muscles to brace your spine. Your tailbone should point down to the ground, not behind your body, resulting in an over-extended swayback posture.  INCORRECT STANDING POSTURES  Common incorrect standing postures include a forward head, locked knees and/or an excessive swayback. WALKING Walk with an upright posture. Your ears, shoulders and hips should all line-up. PROLONGED ACTIVITY IN A FLEXED POSITION When completing a task that requires you to bend forward at your waist or lean over a low surface, try to find a way to stabilize 3 of 4 of your limbs. You can place a hand or elbow on your thigh or rest a knee on the surface you are reaching across. This will provide you more stability so that your muscles do not fatigue as quickly. By keeping your knees relaxed, or slightly bent, you will also reduce stress across your low back. CORRECT LIFTING TECHNIQUES DO :   Assume a wide stance. This will provide you more stability and the opportunity to get as close as possible to the object which you are lifting.  Tense your abdominals to brace your spine; then bend at the knees and hips. Keeping your back locked in a neutral-spine position, lift using your leg muscles. Lift with your legs, keeping your back straight.  Test the weight of unknown objects before attempting to lift them.  Try to keep your elbows locked down at your sides in order get the best strength from your shoulders when carrying an object.  Always ask for help when lifting heavy or awkward  objects. INCORRECT LIFTING TECHNIQUES DO NOT:   Lock your knees when lifting, even if it is a small object.  Bend and twist. Pivot at your feet or move your feet when needing to change directions.  Assume that you cannot safely pick up a paperclip without proper posture.   This information is not intended to replace advice given to you by your health care provider. Make sure you discuss any questions you have with your health care provider.   Document Released: 08/04/2005 Document Revised: 12/19/2014 Document Reviewed: 11/16/2008 [employer] Interactive Patient Education [employer].

## 2015-08-05 NOTE — Progress Notes (Signed)
Subjective:  By signing my name below, I, Raven Small, attest that this documentation has been prepared under the direction and in the presence of Merri Ray, MD.  Electronically Signed: Thea Alken, ED Scribe. 08/05/2015. 2:34 PM.  Patient ID: Diana Tate, female    DOB: 10/06/1970, 44 y.o.   MRN: [id number]  HPI  Chief Complaint  Patient presents with  . Sciatica    left leg   HPI Comments: Diana Tate is a 44 y.o. female who presents to the [facility] complaining of gradual worsening, left buttock that shoots down left leg, onset 3 days ago . Pt states pain started 3 days ago as a "twinge" in left buttock with certain movement but worsened the following day with left leg pain. She denies injury, falls and increased in activity. She reports  worsening pain with bearing weight to left leg.  She has been taking ibuprofen 800mg  every 6 hours for the past 2-3 days. 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 She has hx of intermittent low back since having child, youngest child 48 y.o., and has been seen pt chiropractor in the past. She last had imaging of back 7-8 years ago. She denies bladder and bowel incontinence and saddle anesthesia.    Patient Active Problem List   Diagnosis Date Noted  . Insomnia 12/03/2014  . Alternating constipation and diarrhea 05/16/2014  . Abdominal pain, epigastric 05/16/2014  . Esophageal reflux 12/12/2013  . Migraine headache with aura 03/30/2013  . 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 GERD (gastroesophageal reflux disease)   . Hyperlipidemia   . Migraine     Classic   Past Surgical History  Procedure Laterality Date  . Cholecystectomy  2006  . Tonsillectomy  2000  . Endometrial ablation  2007  . Breast surgery      benign  . Esophagogastroduodenoscopy N/A 05/22/2014    SLF: 1. Schatzki ring was found 2.  MIld non-erosive gastritis   Allergies  Allergen Reactions  . Lexapro [Escitalopram Oxalate] Nausea Only   Prior to Admission medications   Medication Sig Start Date End Date Taking? Authorizing Provider  ibuprofen (ADVIL,MOTRIN) 600 MG tablet Take 1 tablet (600 mg total) by mouth every 6 (six) hours as needed. 10/13/14  Yes Ankit Nanavati, MD  pantoprazole (PROTONIX) 40 MG tablet TAKE 1 TABLET (40 MG TOTAL) BY MOUTH 2 (TWO) TIMES DAILY. 02/26/15  Yes Nilda Simmer, NP  amoxicillin-clavulanate (AUGMENTIN) 875-125 MG tablet Take 1 tablet by mouth 2 (two) times daily. Patient not taking: Reported on 08/05/2015 06/12/15   Kathyrn Drown, MD  CARAFATE 1 GM/10ML suspension TAKE 10MLS 4 TIMES DAILY WITH MEALS AND AT BEDTIME Patient not taking: Reported on 06/12/2015 12/01/14   Nilda Simmer, NP  fluconazole (DIFLUCAN) 150 MG tablet Take one tablet 3 days apart. Patient not taking: Reported on 08/05/2015 06/18/15   Kathyrn Drown, MD  HYDROcodone-homatropine [facility]) 5-1.5 MG/5ML syrup Take 5 mLs by mouth every 6 (six) hours as needed for cough. Patient not taking: Reported on 08/05/2015 06/12/15   Kathyrn Drown, MD  zolpidem (AMBIEN) 10 MG tablet Take 1 tablet (10 mg total) by mouth at bedtime as needed for sleep. 10/16/14 11/15/14  Nilda Simmer, NP   Social History   Social History  . Marital Status: Married    Spouse Name: N/A  .  Number of Children: 2  . Years of Education: N/A   Occupational History  . DEPUTY CLERK    Social History Main Topics  . Smoking status: Former Smoker -- 1.00 packs/day for 24 years    Types: Cigarettes  . Smokeless tobacco: Never Used     Comment: Quit x 2 years  . Alcohol Use: No     Comment: occ  . Drug Use: No  . Sexual Activity: Yes   Other Topics Concern  . Not on file   Social History Narrative    Review of Systems  Genitourinary: Negative for enuresis and difficulty urinating.  Musculoskeletal: Positive for myalgias, back pain  and gait problem.  Skin: Negative for rash.  Neurological: Negative for weakness and numbness.   Objective:   Physical Exam  Constitutional: She is oriented to person, place, and time. She appears well-developed and well-nourished. No distress.  HENT:  Head: Normocephalic and atraumatic.  Eyes: Conjunctivae and EOM are normal.  Neck: Neck supple.  Cardiovascular: Normal rate, regular rhythm and normal heart sounds.  Exam reveals no gallop and no friction rub.   No murmur heard. Pulmonary/Chest: Effort normal and breath sounds normal. She has no wheezes. She has no rales.  Musculoskeletal: Normal range of motion.  No midline bony tender. She does have tenderness to lumbar para spinal muscles. Tender left sciatic notch. Positive seated leg raise with pain radiating from buttock to left leg. Guarded with toe stance due to pain. Able to heal walk without difficulty. Guarded flexion approximately 20-30 degrees before she has pain. Guarded but equal lateral rotation.   Neurological: She is alert and oriented to person, place, and time. She displays no Babinski's sign on the right side. She displays no Babinski's sign on the left side.  Reflex Scores:      Patellar reflexes are 2+ on the right side and 2+ on the left side.      Achilles reflexes are 2+ on the right side and 2+ on the left side. Skin: Skin is warm and dry.  Psychiatric: She has a normal mood and affect. Her behavior is normal.  Nursing note and vitals reviewed.   Filed Vitals:   08/05/15 1415  BP: 120/80  Pulse: 79  Temp: 98.4 F (36.9 C)  TempSrc: Oral  Resp: 20  Height: 5\' 6"  (1.676 m)  Weight: 163 lb 6.4 oz (74.118 kg)  SpO2: 98%   [facility] reading (PRIMARY) by Dr. Carlota Raspberry. Lumbar spine- Possible minimal decreased space L2-3, L5-S1 without significant degenerative change.    Assessment & Plan:   Diana Tate is a 44 y.o. female Left-sided low back pain with left-sided sciatica - Plan: DG Lumbar Spine Complete,  cyclobenzaprine (FLEXERIL) 5 MG tablet, HYDROcodone-acetaminophen (NORCO/VICODIN) 5-325 MG tablet, predniSONE (DELTASONE) 20 MG tablet  Muscle spasm of back - Plan: cyclobenzaprine (FLEXERIL) 5 MG tablet, HYDROcodone-acetaminophen (NORCO/VICODIN) 5-325 MG tablet, predniSONE (DELTASONE) 20 MG tablet  Left leg pain - Plan: cyclobenzaprine (FLEXERIL) 5 MG tablet, HYDROcodone-acetaminophen (NORCO/VICODIN) 5-325 MG tablet, predniSONE (DELTASONE) 20 MG tablet  Suspected acute sciatica. No red flags seen on exam or xray.   -start flexeril, hydrocodone if needed for breakthrough pain, SED of both discussed and cautioned on combining.   -heat, gentle ROM, handout given.   -continue ibuprofen 800mg  Q8h with food for now, but if not improving in few days - stop ibuprofen, start prednisone. SED, and RTC precautions discussed.   Meds ordered this encounter  Medications  . cyclobenzaprine (FLEXERIL) 5 MG  tablet    Sig: 1 pill by mouth up to every 8 hours as needed. Start with one pill by mouth each bedtime as needed due to sedation    Dispense:  15 tablet    Refill:  0  . HYDROcodone-acetaminophen (NORCO/VICODIN) 5-325 MG tablet    Sig: Take 1 tablet by mouth every 6 (six) hours as needed for moderate pain.    Dispense:  15 tablet    Refill:  0  . predniSONE (DELTASONE) 20 MG tablet    Sig: 3 by mouth for 3 days, then 2 by mouth for 2 days, then 1 by mouth for 2 days, then 1/2 by mouth for 2 days.    Dispense:  16 tablet    Refill:  0   Patient Instructions  You appear to have sciatica.  You can continue ibuprofen up to every 8 hours as needed with food, flexeril up to every 8 hours for muscle spasm and hydrocodone if needed for more severe pain. If not starting to improve in next few days, can start printed prescription for prednisone (STOP ibuprofen if taking prednisone). Return to the clinic or go to the nearest emergency room if any of your symptoms worsen or new symptoms occur.  Sciatica With  Rehab The sciatic nerve runs from the back down the leg and is responsible for sensation and control of the muscles in the back (posterior) side of the thigh, lower leg, and foot. Sciatica is a condition that is characterized by inflammation of this nerve.  SYMPTOMS   Signs of nerve damage, including numbness and/or weakness along the posterior side of the lower extremity.  Pain in the back of the thigh that may also travel down the leg.  Pain that worsens when sitting for long periods of time.  Occasionally, pain in the back or buttock. CAUSES  Inflammation of the sciatic nerve is the cause of sciatica. The inflammation is due to something irritating the nerve. Common sources of irritation include:  Sitting for long periods of time.  Direct trauma to the nerve.  Arthritis of the spine.  Herniated or ruptured disk.  Slipping of the vertebrae (spondylolisthesis).  Pressure from soft tissues, such as muscles or ligament-like tissue (fascia). RISK INCREASES WITH:  Sports that place pressure or stress on the spine (football or weightlifting).  Poor strength and flexibility.  Failure to warm up properly before activity.  Family history of low back pain or disk disorders.  Previous back injury or surgery.  Poor body mechanics, especially when lifting, or poor posture. PREVENTION   Warm up and stretch properly before activity.  Maintain physical fitness:  Strength, flexibility, and endurance.  Cardiovascular fitness.  Learn and use proper technique, especially with posture and lifting. When possible, have coach correct improper technique.  Avoid activities that place stress on the spine. PROGNOSIS If treated properly, then sciatica usually resolves within 6 weeks. However, occasionally surgery is necessary.  RELATED COMPLICATIONS   Permanent nerve damage, including pain, numbness, tingle, or weakness.  Chronic back pain.  Risks of surgery: infection, bleeding,  nerve damage, or damage to surrounding tissues. TREATMENT Treatment initially involves resting from any activities that aggravate your symptoms. The use of ice and medication may help reduce pain and inflammation. The use of strengthening and stretching exercises may help reduce pain with activity. These exercises may be performed at home or with referral to a therapist. A therapist may recommend further treatments, such as transcutaneous electronic nerve stimulation (TENS) or ultrasound.  Your caregiver may recommend corticosteroid injections to help reduce inflammation of the sciatic nerve. If symptoms persist despite non-surgical (conservative) treatment, then surgery may be recommended. MEDICATION  If pain medication is necessary, then nonsteroidal anti-inflammatory medications, such as aspirin and ibuprofen, or other minor pain relievers, such as acetaminophen, are often recommended.  Do not take pain medication for 7 days before surgery.  Prescription pain relievers may be given if deemed necessary by your caregiver. Use only as directed and only as much as you need.  Ointments applied to the skin may be helpful.  Corticosteroid injections may be given by your caregiver. These injections should be reserved for the most serious cases, because they may only be given a certain number of times. HEAT AND COLD  Cold treatment (icing) relieves pain and reduces inflammation. Cold treatment should be applied for 10 to 15 minutes every 2 to 3 hours for inflammation and pain and immediately after any activity that aggravates your symptoms. Use ice packs or massage the area with a piece of ice (ice massage).  Heat treatment may be used prior to performing the stretching and strengthening activities prescribed by your caregiver, physical therapist, or athletic trainer. Use a heat pack or soak the injury in warm water. SEEK MEDICAL CARE IF:  Treatment seems to offer no benefit, or the condition  worsens.  Any medications produce adverse side effects. EXERCISES  RANGE OF MOTION (ROM) AND STRETCHING EXERCISES - Sciatica Most people with sciatic will find that their symptoms worsen with either excessive bending forward (flexion) or arching at the low back (extension). The exercises which will help resolve your symptoms will focus on the opposite motion. Your physician, physical therapist or athletic trainer will help you determine which exercises will be most helpful to resolve your low back pain. Do not complete any exercises without first consulting with your clinician. Discontinue any exercises which worsen your symptoms until you speak to your clinician. If you have pain, numbness or tingling which travels down into your buttocks, leg or foot, the goal of the therapy is for these symptoms to move closer to your back and eventually resolve. Occasionally, these leg symptoms will get better, but your low back pain may worsen; this is typically an indication of progress in your rehabilitation. Be certain to be very alert to any changes in your symptoms and the activities in which you participated in the 24 hours prior to the change. Sharing this information with your clinician will allow him/her to most efficiently treat your condition. These exercises may help you when beginning to rehabilitate your injury. Your symptoms may resolve with or without further involvement from your physician, physical therapist or athletic trainer. While completing these exercises, remember:   Restoring tissue flexibility helps normal motion to return to the joints. This allows healthier, less painful movement and activity.  An effective stretch should be held for at least 30 seconds.  A stretch should never be painful. You should only feel a gentle lengthening or release in the stretched tissue. FLEXION RANGE OF MOTION AND STRETCHING EXERCISES: STRETCH - Flexion, Single Knee to Chest   Lie on a firm bed or floor  with both legs extended in front of you.  Keeping one leg in contact with the floor, bring your opposite knee to your chest. Hold your leg in place by either grabbing behind your thigh or at your knee.  Pull until you feel a gentle stretch in your low back. Hold __________ seconds.  Slowly release  your grasp and repeat the exercise with the opposite side. Repeat __________ times. Complete this exercise __________ times per day.  STRETCH - Flexion, Double Knee to Chest  Lie on a firm bed or floor with both legs extended in front of you.  Keeping one leg in contact with the floor, bring your opposite knee to your chest.  Tense your stomach muscles to support your back and then lift your other knee to your chest. Hold your legs in place by either grabbing behind your thighs or at your knees.  Pull both knees toward your chest until you feel a gentle stretch in your low back. Hold __________ seconds.  Tense your stomach muscles and slowly return one leg at a time to the floor. Repeat __________ times. Complete this exercise __________ times per day.  STRETCH - Low Trunk Rotation   Lie on a firm bed or floor. Keeping your legs in front of you, bend your knees so they are both pointed toward the ceiling and your feet are flat on the floor.  Extend your arms out to the side. This will stabilize your upper body by keeping your shoulders in contact with the floor.  Gently and slowly drop both knees together to one side until you feel a gentle stretch in your low back. Hold for __________ seconds.  Tense your stomach muscles to support your low back as you bring your knees back to the starting position. Repeat the exercise to the other side. Repeat __________ times. Complete this exercise __________ times per day  EXTENSION RANGE OF MOTION AND FLEXIBILITY EXERCISES: STRETCH - Extension, Prone on Elbows  Lie on your stomach on the floor, a bed will be too soft. Place your palms about shoulder  width apart and at the height of your head.  Place your elbows under your shoulders. If this is too painful, stack pillows under your chest.  Allow your body to relax so that your hips drop lower and make contact more completely with the floor.  Hold this position for __________ seconds.  Slowly return to lying flat on the floor. Repeat __________ times. Complete this exercise __________ times per day.  RANGE OF MOTION - Extension, Prone Press Ups  Lie on your stomach on the floor, a bed will be too soft. Place your palms about shoulder width apart and at the height of your head.  Keeping your back as relaxed as possible, slowly straighten your elbows while keeping your hips on the floor. You may adjust the placement of your hands to maximize your comfort. As you gain motion, your hands will come more underneath your shoulders.  Hold this position __________ seconds.  Slowly return to lying flat on the floor. Repeat __________ times. Complete this exercise __________ times per day.  STRENGTHENING EXERCISES - Sciatica  These exercises may help you when beginning to rehabilitate your injury. These exercises should be done near your "sweet spot." This is the neutral, low-back arch, somewhere between fully rounded and fully arched, that is your least painful position. When performed in this safe range of motion, these exercises can be used for people who have either a flexion or extension based injury. These exercises may resolve your symptoms with or without further involvement from your physician, physical therapist or athletic trainer. While completing these exercises, remember:   Muscles can gain both the endurance and the strength needed for everyday activities through controlled exercises.  Complete these exercises as instructed by your physician, physical therapist or athletic  trainer. Progress with the resistance and repetition exercises only as your caregiver advises.  You may  experience muscle soreness or fatigue, but the pain or discomfort you are trying to eliminate should never worsen during these exercises. If this pain does worsen, stop and make certain you are following the directions exactly. If the pain is still present after adjustments, discontinue the exercise until you can discuss the trouble with your clinician. STRENGTHENING - Deep Abdominals, Pelvic Tilt   Lie on a firm bed or floor. Keeping your legs in front of you, bend your knees so they are both pointed toward the ceiling and your feet are flat on the floor.  Tense your lower abdominal muscles to press your low back into the floor. This motion will rotate your pelvis so that your tail bone is scooping upwards rather than pointing at your feet or into the floor.  With a gentle tension and even breathing, hold this position for __________ seconds. Repeat __________ times. Complete this exercise __________ times per day.  STRENGTHENING - Abdominals, Crunches   Lie on a firm bed or floor. Keeping your legs in front of you, bend your knees so they are both pointed toward the ceiling and your feet are flat on the floor. Cross your arms over your chest.  Slightly tip your chin down without bending your neck.  Tense your abdominals and slowly lift your trunk high enough to just clear your shoulder blades. Lifting higher can put excessive stress on the low back and does not further strengthen your abdominal muscles.  Control your return to the starting position. Repeat __________ times. Complete this exercise __________ times per day.  STRENGTHENING - Quadruped, Opposite UE/LE Lift  Assume a hands and knees position on a firm surface. Keep your hands under your shoulders and your knees under your hips. You may place padding under your knees for comfort.  Find your neutral spine and gently tense your abdominal muscles so that you can maintain this position. Your shoulders and hips should form a rectangle  that is parallel with the floor and is not twisted.  Keeping your trunk steady, lift your right hand no higher than your shoulder and then your left leg no higher than your hip. Make sure you are not holding your breath. Hold this position __________ seconds.  Continuing to keep your abdominal muscles tense and your back steady, slowly return to your starting position. Repeat with the opposite arm and leg. Repeat __________ times. Complete this exercise __________ times per day.  STRENGTHENING - Abdominals and Quadriceps, Straight Leg Raise   Lie on a firm bed or floor with both legs extended in front of you.  Keeping one leg in contact with the floor, bend the other knee so that your foot can rest flat on the floor.  Find your neutral spine, and tense your abdominal muscles to maintain your spinal position throughout the exercise.  Slowly lift your straight leg off the floor about 6 inches for a count of 15, making sure to not hold your breath.  Still keeping your neutral spine, slowly lower your leg all the way to the floor. Repeat this exercise with each leg __________ times. Complete this exercise __________ times per day. POSTURE AND BODY MECHANICS CONSIDERATIONS - Sciatica Keeping correct posture when sitting, standing or completing your activities will reduce the stress put on different body tissues, allowing injured tissues a chance to heal and limiting painful experiences. The following are general guidelines for improved posture.  Your physician or physical therapist will provide you with any instructions specific to your needs. While reading these guidelines, remember:  The exercises prescribed by your provider will help you have the flexibility and strength to maintain correct postures.  The correct posture provides the optimal environment for your joints to work. All of your joints have less wear and tear when properly supported by a spine with good posture. This means you will  experience a healthier, less painful body.  Correct posture must be practiced with all of your activities, especially prolonged sitting and standing. Correct posture is as important when doing repetitive low-stress activities (typing) as it is when doing a single heavy-load activity (lifting). RESTING POSITIONS Consider which positions are most painful for you when choosing a resting position. If you have pain with flexion-based activities (sitting, bending, stooping, squatting), choose a position that allows you to rest in a less flexed posture. You would want to avoid curling into a fetal position on your side. If your pain worsens with extension-based activities (prolonged standing, working overhead), avoid resting in an extended position such as sleeping on your stomach. Most people will find more comfort when they rest with their spine in a more neutral position, neither too rounded nor too arched. Lying on a non-sagging bed on your side with a pillow between your knees, or on your back with a pillow under your knees will often provide some relief. Keep in mind, being in any one position for a prolonged period of time, no matter how correct your posture, can still lead to stiffness. PROPER SITTING POSTURE In order to minimize stress and discomfort on your spine, you must sit with correct posture Sitting with good posture should be effortless for a healthy body. Returning to good posture is a gradual process. Many people can work toward this most comfortably by using various supports until they have the flexibility and strength to maintain this posture on their own. When sitting with proper posture, your ears will fall over your shoulders and your shoulders will fall over your hips. You should use the back of the chair to support your upper back. Your low back will be in a neutral position, just slightly arched. You may place a small pillow or folded towel at the base of your low back for support.  When  working at a desk, create an environment that supports good, upright posture. Without extra support, muscles fatigue and lead to excessive strain on joints and other tissues. Keep these recommendations in mind: CHAIR:   A chair should be able to slide under your desk when your back makes contact with the back of the chair. This allows you to work closely.  The chair's height should allow your eyes to be level with the upper part of your monitor and your hands to be slightly lower than your elbows. BODY POSITION  Your feet should make contact with the floor. If this is not possible, use a foot rest.  Keep your ears over your shoulders. This will reduce stress on your neck and low back. INCORRECT SITTING POSTURES   If you are feeling tired and unable to assume a healthy sitting posture, do not slouch or slump. This puts excessive strain on your back tissues, causing more damage and pain. Healthier options include:  Using more support, like a lumbar pillow.  Switching tasks to something that requires you to be upright or walking.  Talking a brief walk.  Lying down to rest in a neutral-spine  position. PROLONGED STANDING WHILE SLIGHTLY LEANING FORWARD  When completing a task that requires you to lean forward while standing in one place for a long time, place either foot up on a stationary 2-4 inch high object to help maintain the best posture. When both feet are on the ground, the low back tends to lose its slight inward curve. If this curve flattens (or becomes too large), then the back and your other joints will experience too much stress, fatigue more quickly and can cause pain.  CORRECT STANDING POSTURES Proper standing posture should be assumed with all daily activities, even if they only take a few moments, like when brushing your teeth. As in sitting, your ears should fall over your shoulders and your shoulders should fall over your hips. You should keep a slight tension in your abdominal  muscles to brace your spine. Your tailbone should point down to the ground, not behind your body, resulting in an over-extended swayback posture.  INCORRECT STANDING POSTURES  Common incorrect standing postures include a forward head, locked knees and/or an excessive swayback. WALKING Walk with an upright posture. Your ears, shoulders and hips should all line-up. PROLONGED ACTIVITY IN A FLEXED POSITION When completing a task that requires you to bend forward at your waist or lean over a low surface, try to find a way to stabilize 3 of 4 of your limbs. You can place a hand or elbow on your thigh or rest a knee on the surface you are reaching across. This will provide you more stability so that your muscles do not fatigue as quickly. By keeping your knees relaxed, or slightly bent, you will also reduce stress across your low back. CORRECT LIFTING TECHNIQUES DO :   Assume a wide stance. This will provide you more stability and the opportunity to get as close as possible to the object which you are lifting.  Tense your abdominals to brace your spine; then bend at the knees and hips. Keeping your back locked in a neutral-spine position, lift using your leg muscles. Lift with your legs, keeping your back straight.  Test the weight of unknown objects before attempting to lift them.  Try to keep your elbows locked down at your sides in order get the best strength from your shoulders when carrying an object.  Always ask for help when lifting heavy or awkward objects. INCORRECT LIFTING TECHNIQUES DO NOT:   Lock your knees when lifting, even if it is a small object.  Bend and twist. Pivot at your feet or move your feet when needing to change directions.  Assume that you cannot safely pick up a paperclip without proper posture.   This information is not intended to replace advice given to you by your health care provider. Make sure you discuss any questions you have with your health care provider.    Document Released: 08/04/2005 Document Revised: 12/19/2014 Document Reviewed: 11/16/2008 Elsevier Interactive Patient Education [employer].     I personally performed the services described in this documentation, which was scribed in my presence. The recorded information has been reviewed and considered, and addended by me as needed.

## 2015-08-28 ENCOUNTER — Encounter: Payer: Self-pay | Admitting: Family Medicine

## 2015-08-28 ENCOUNTER — Ambulatory Visit (INDEPENDENT_AMBULATORY_CARE_PROVIDER_SITE_OTHER): Payer: 59 | Admitting: Family Medicine

## 2015-08-28 VITALS — BP 100/70 | Temp 98.8°F | Ht 66.0 in | Wt 163.0 lb

## 2015-08-28 DIAGNOSIS — J111 Influenza due to unidentified influenza virus with other respiratory manifestations: Secondary | ICD-10-CM | POA: Diagnosis not present

## 2015-08-28 DIAGNOSIS — J209 Acute bronchitis, unspecified: Secondary | ICD-10-CM | POA: Diagnosis not present

## 2015-08-28 DIAGNOSIS — J019 Acute sinusitis, unspecified: Secondary | ICD-10-CM | POA: Diagnosis not present

## 2015-08-28 DIAGNOSIS — B9689 Other specified bacterial agents as the cause of diseases classified elsewhere: Secondary | ICD-10-CM

## 2015-08-28 MED ORDER — AZITHROMYCIN 250 MG PO TABS: ORAL_TABLET | ORAL | Status: DC

## 2015-08-28 NOTE — Progress Notes (Signed)
   Subjective:    Patient ID: Diana Tate, female    DOB: [birth year], 45 y.o.   MRN: [id number]  Sinusitis This is a new problem. The current episode started in the past 7 days. The problem is unchanged. The pain is moderate. Associated symptoms include congestion, coughing and headaches. Pertinent negatives include no ear pain or shortness of breath. (Fever) Past treatments include oral decongestants (NSAIDS). The treatment provided no relief.   Patient states that she has no other concerns at this time.  Patient with intermittent fevers and chills intermittent sweats body aches symptoms present over the past 3-4 days now with a lot of head congestion drainage and coughing no shortness of breath no wheezing  Review of Systems  Constitutional: Negative for fever and activity change.  HENT: Positive for congestion and rhinorrhea. Negative for ear pain.   Eyes: Negative for discharge.  Respiratory: Positive for cough. Negative for shortness of breath and wheezing.   Cardiovascular: Negative for chest pain.  Neurological: Positive for headaches.       Objective:   Physical Exam  Constitutional: She appears well-developed.  HENT:  Head: Normocephalic.  Nose: Nose normal.  Mouth/Throat: Oropharynx is clear and moist. No oropharyngeal exudate.  Neck: Neck supple.  Cardiovascular: Normal rate and normal heart sounds.   No murmur heard. Pulmonary/Chest: Effort normal and breath sounds normal. She has no wheezes.  Lymphadenopathy:    She has no cervical adenopathy.  Skin: Skin is warm and dry.  Nursing note and vitals reviewed.    Patient not toxic antibiotics prescribed     Assessment & Plan:  Patient was seen today for upper respiratory illness. It is felt that the patient is dealing with sinusitis. Antibiotics were prescribed today. Importance of compliance with medication was discussed. Symptoms should gradually resolve over the course of the next several days. If high fevers,  progressive illness, difficulty breathing, worsening condition or failure for symptoms to improve over the next several days then the patient is to follow-up. If any emergent conditions the patient is to follow-up in the emergency department otherwise to follow-up in the office.   Influenza-the patient was diagnosed with influenza. Patient/family educated about the flu and warning signs to watch for. If difficulty breathing, severe neck pain and stiffness, cyanosis, disorientation, or progressive worsening then immediately get rechecked at that ER. If progressive symptoms be certain to be rechecked. Supportive measures such as Tylenol/ibuprofen was discussed. No aspirin use in children. And influenza home care instruction sheet was given.

## 2016-01-31 ENCOUNTER — Encounter: Payer: Self-pay | Admitting: Nurse Practitioner

## 2016-01-31 ENCOUNTER — Ambulatory Visit (INDEPENDENT_AMBULATORY_CARE_PROVIDER_SITE_OTHER): Payer: 59 | Admitting: Nurse Practitioner

## 2016-01-31 ENCOUNTER — Ambulatory Visit (HOSPITAL_COMMUNITY)
Admission: RE | Admit: 2016-01-31 | Discharge: 2016-01-31 | Disposition: A | Discharge status: Home / Self Care | Payer: 59 | Source: Ambulatory Visit | Attending: Nurse Practitioner | Admitting: Nurse Practitioner

## 2016-01-31 VITALS — BP 110/74 | Ht 66.0 in | Wt 177.2 lb

## 2016-01-31 DIAGNOSIS — S40262A Insect bite (nonvenomous) of left shoulder, initial encounter: Secondary | ICD-10-CM

## 2016-01-31 DIAGNOSIS — W57XXXA Bitten or stung by nonvenomous insect and other nonvenomous arthropods, initial encounter: Secondary | ICD-10-CM | POA: Diagnosis not present

## 2016-01-31 DIAGNOSIS — M25512 Pain in left shoulder: Secondary | ICD-10-CM | POA: Diagnosis not present

## 2016-01-31 IMAGING — DX DG SHOULDER 2+V*L*
3 series · 3 of 3 positions shown · non-contrast
Comparison: None.

CLINICAL DATA: Left shoulder pain for 2 weeks, no injury

EXAM:
LEFT SHOULDER - 2+ VIEW

[shoulder ap]
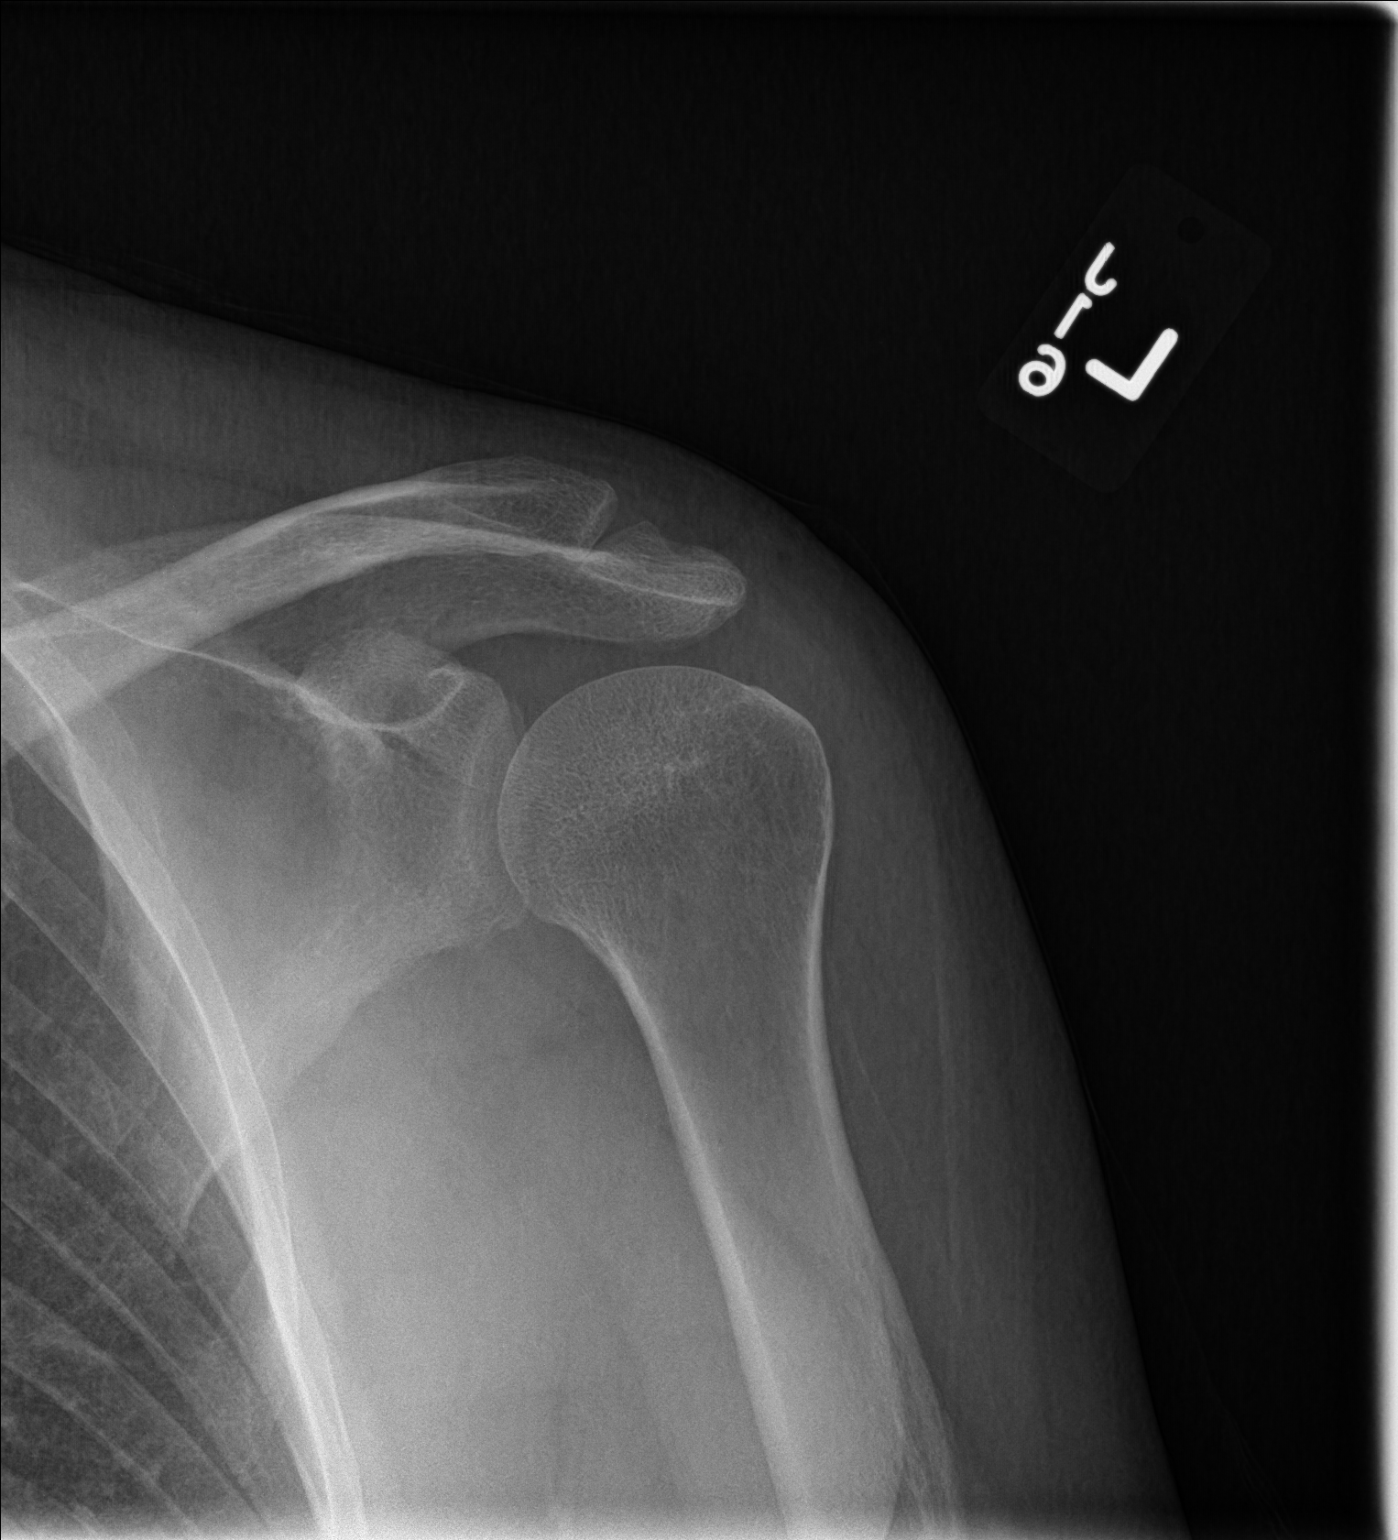

[shoulder y view]
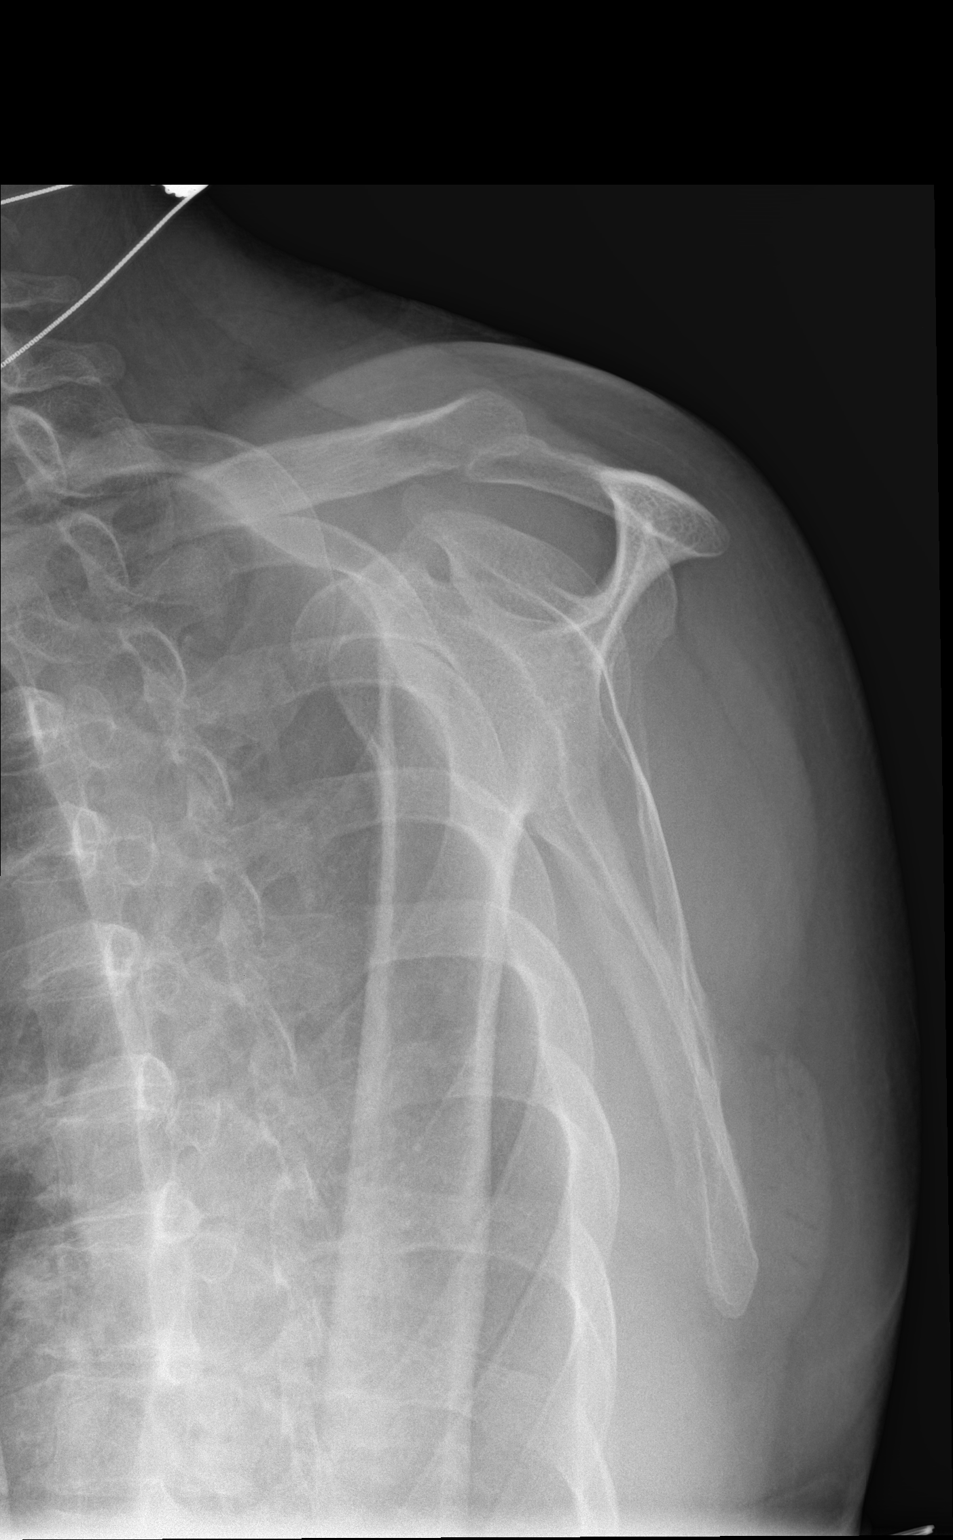

[shoulder axillary]
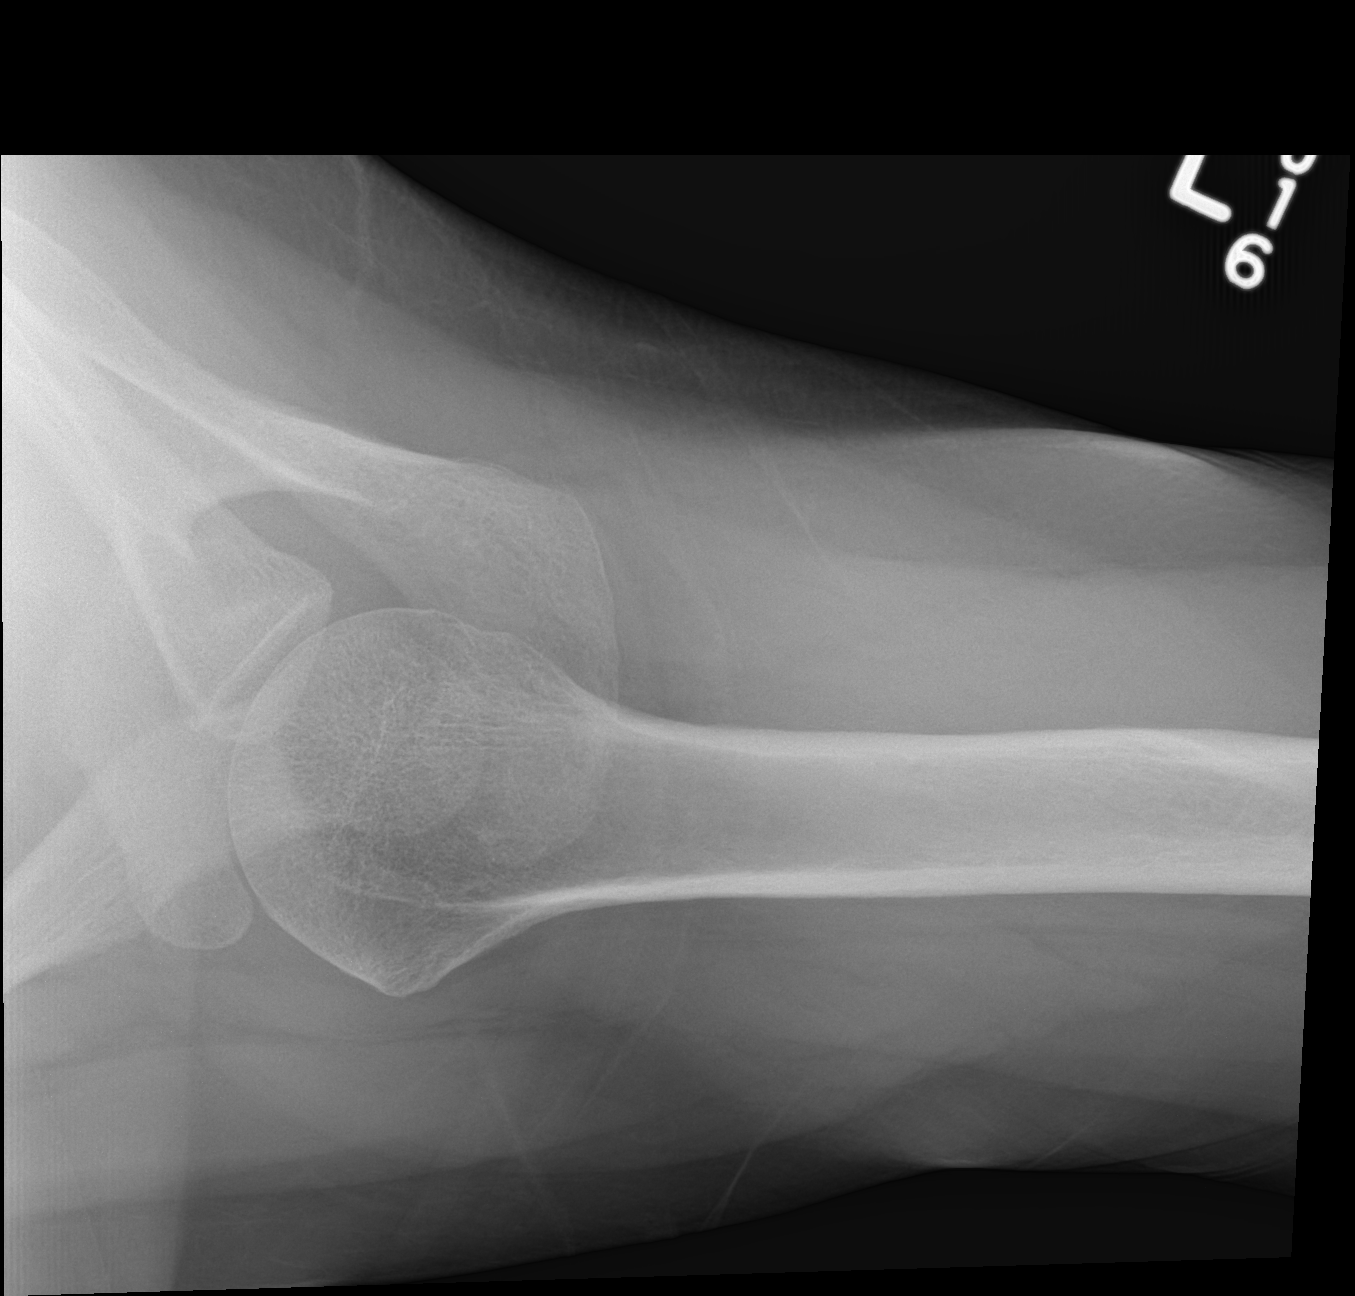

[3 of 3 positions shown; findings below may reference images not displayed]

FINDINGS: The left humeral head is in normal position within the glenohumeral
joint space and no significant degenerative change is seen. The left
AC joint is normally aligned.
IMPRESSION: Negative.

## 2016-01-31 MED ORDER — NAPROXEN 500 MG PO TABS: 500.0000 mg | ORAL_TABLET | Freq: Two times a day (BID) | ORAL | Status: DC

## 2016-01-31 MED ORDER — CHLORZOXAZONE 500 MG PO TABS: 500.0000 mg | ORAL_TABLET | Freq: Three times a day (TID) | ORAL | Status: DC | PRN

## 2016-01-31 MED ORDER — HYDROCODONE-ACETAMINOPHEN 5-325 MG PO TABS: 1.0000 | ORAL_TABLET | Freq: Four times a day (QID) | ORAL | Status: DC | PRN

## 2016-01-31 NOTE — Patient Instructions (Signed)
Ice or heat applications NSAID biofreeze or Lidocaine patches Massage therapy Equ[employer]) [employer] Relief OTC TENS unit

## 2016-01-31 NOTE — Progress Notes (Signed)
Subjective:  Presents for 2 issues. Patient has a tick on her left mid back area. Just noticed this morning. Also left shoulder pain that began about 2 weeks ago which she relates to moving some furniture. Denies any specific history of injury. Has gotten progressively worse. Worse with certain movements.  Objective:   BP 110/74 mmHg  Ht 5\' 6"  (1.676 m)  Wt 177 lb 4 oz (80.4 kg)  BMI 28.62 kg/m2 NAD. Alert, oriented. A small tic with a tiny white spot noted embedded in the left mid back area. Removed with forceps without difficulty. No bleeding or redness at the site. Lungs clear. Heart regular rate rhythm. On exam left shoulder slightly lower than the right and pushed slightly forward. Can perform full rotation of the shoulder with tenderness noted. Certain movements produce sharp pain which causes her to stop. Can perform full range of motion, mainly difficulty with internal movements. Hand and arm strength 5+ bilateral. Radial pulses strong. Extremely tight tender muscles noted along the upper back and neck area including the lateral neck. Tenderness noted along the shoulder joint line particularly anterior and mid shoulder joint.  Assessment: Tick bite of shoulder, left, initial encounter  Left shoulder pain - Plan: HYDROcodone-acetaminophen (NORCO/VICODIN) 5-325 MG tablet, DG Shoulder Left  Plan:  Meds ordered this encounter  Medications  . chlorzoxazone (PARAFON) 500 MG tablet    Sig: Take 1 tablet (500 mg total) by mouth 3 (three) times daily as needed for muscle spasms.    Dispense:  20 tablet    Refill:  0    Order Specific Question:  Supervising Provider    Answer:  Mikey Kirschner [[id number]]  . HYDROcodone-acetaminophen (NORCO/VICODIN) 5-325 MG tablet    Sig: Take 1 tablet by mouth every 6 (six) hours as needed for moderate pain.    Dispense:  15 tablet    Refill:  0    Order Specific Question:  Supervising Provider    Answer:  Mikey Kirschner [[id number]]  . naproxen (NAPROSYN) 500  MG tablet    Sig: Take 1 tablet (500 mg total) by mouth 2 (two) times daily with a meal.    Dispense:  30 tablet    Refill:  0    Order Specific Question:  Supervising Provider    Answer:  Mikey Kirschner [[id number]]   Reviewed signs and symptoms of tick disease. Call back if any problems. X-ray of left shoulder pending.Drowsiness precautions with pain med and muscle relaxant. Ice or heat applications NSAID biofreeze or Lidocaine patches Massage therapy  Icy Hot Smart Relief OTC TENS unit Given a copy of shoulder exercises. Call back in 10-14 days if no improvement, sooner if worse.

## 2016-02-04 ENCOUNTER — Other Ambulatory Visit: Payer: Self-pay | Admitting: Obstetrics and Gynecology

## 2016-02-04 DIAGNOSIS — Z1231 Encounter for screening mammogram for malignant neoplasm of breast: Secondary | ICD-10-CM

## 2016-02-18 ENCOUNTER — Ambulatory Visit: Payer: 59

## 2016-02-20 ENCOUNTER — Encounter: Payer: Self-pay | Admitting: Family Medicine

## 2016-02-20 ENCOUNTER — Ambulatory Visit (INDEPENDENT_AMBULATORY_CARE_PROVIDER_SITE_OTHER): Payer: 59 | Admitting: Family Medicine

## 2016-02-20 ENCOUNTER — Ambulatory Visit
Admission: RE | Admit: 2016-02-20 | Discharge: 2016-02-20 | Disposition: A | Discharge status: Home / Self Care | Payer: 59 | Source: Ambulatory Visit | Attending: Obstetrics and Gynecology | Admitting: Obstetrics and Gynecology

## 2016-02-20 VITALS — BP 128/82 | Ht 66.0 in | Wt 179.4 lb

## 2016-02-20 DIAGNOSIS — G4733 Obstructive sleep apnea (adult) (pediatric): Secondary | ICD-10-CM | POA: Diagnosis not present

## 2016-02-20 DIAGNOSIS — Z1231 Encounter for screening mammogram for malignant neoplasm of breast: Secondary | ICD-10-CM

## 2016-02-20 IMAGING — MG 2D DIGITAL SCREENING BILATERAL MAMMOGRAM WITH CAD AND ADJUNCT TO
8 of 12 series · 8 of 28 positions shown · non-contrast
Comparison: Previous exam(s).

CLINICAL DATA: Screening. High risk excisional biopsy of the left
breast in [R5].

EXAM:
2D DIGITAL SCREENING BILATERAL MAMMOGRAM WITH CAD AND ADJUNCT TOMO

[R CC]
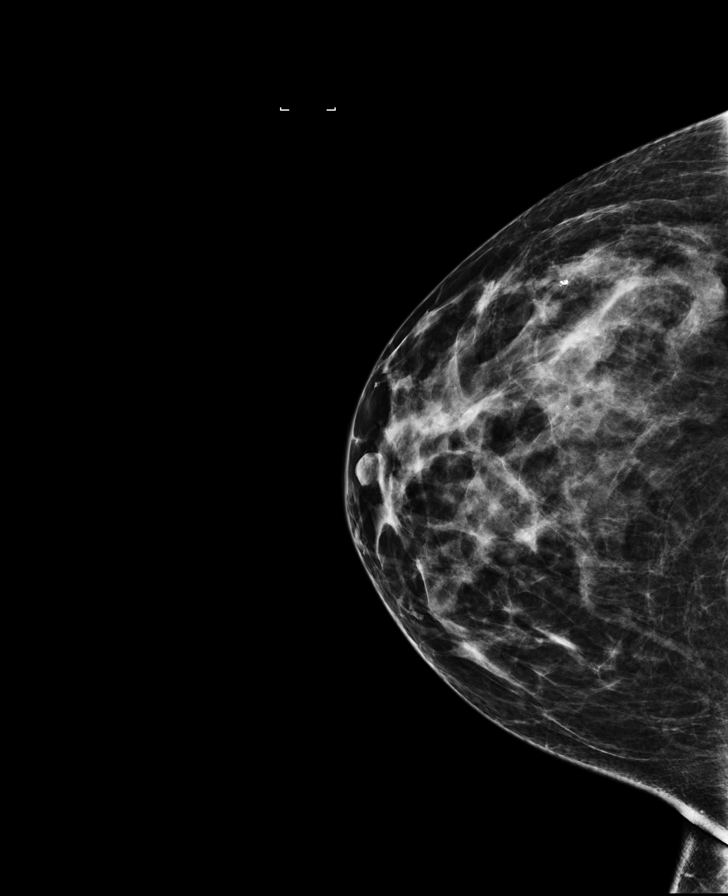

[R CC synth-2D]
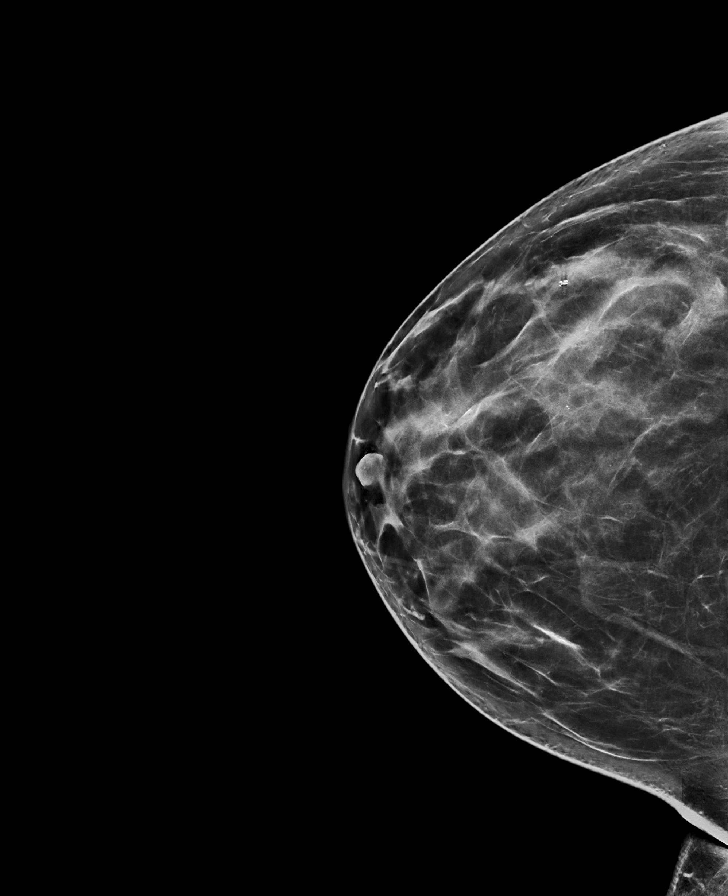

[L CC synth-2D]
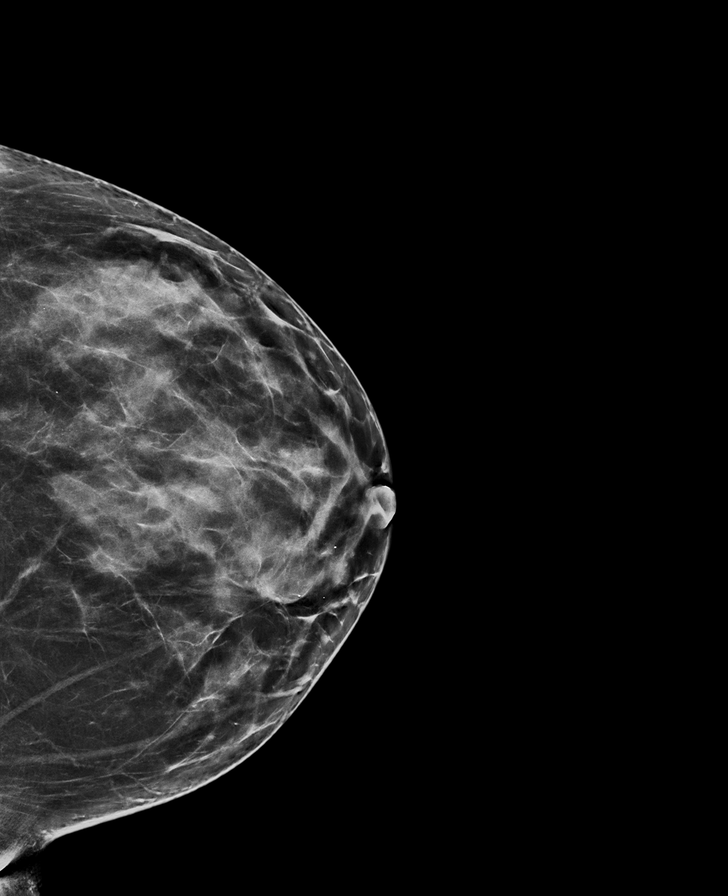

[R MLO]
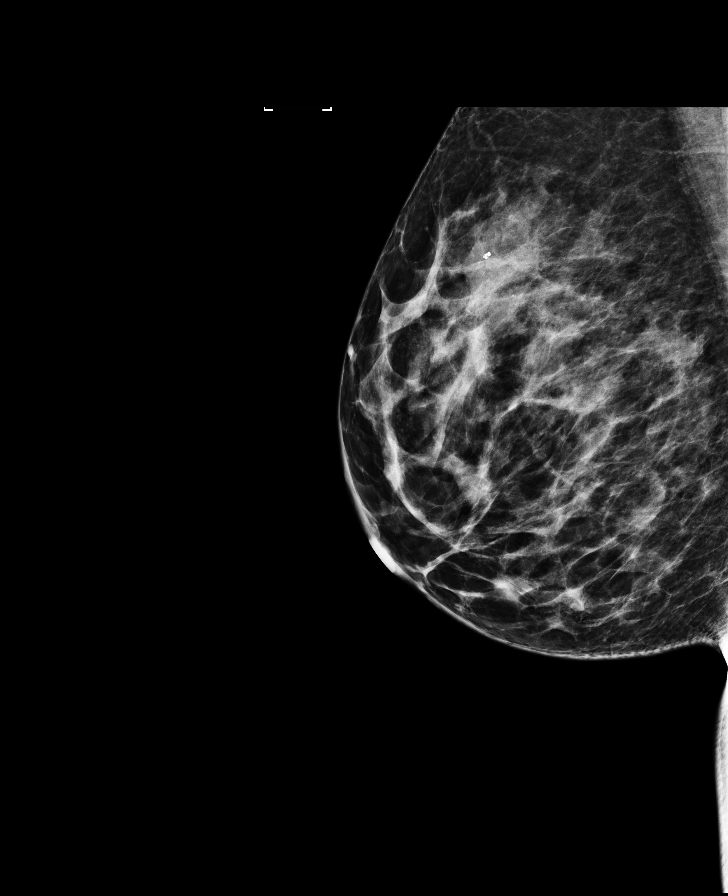

[L MLO synth-2D]
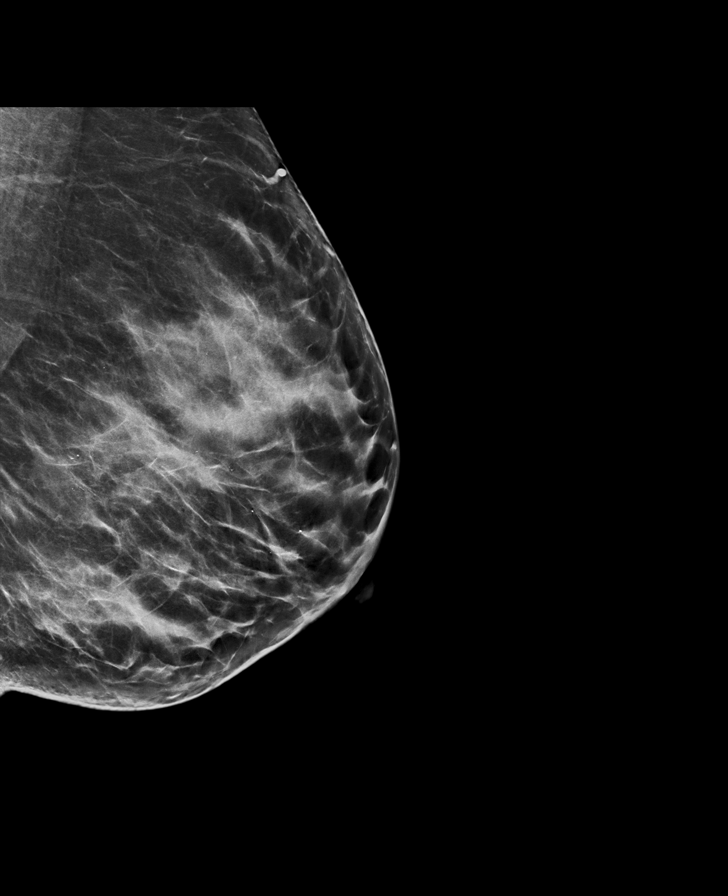

[L CC]
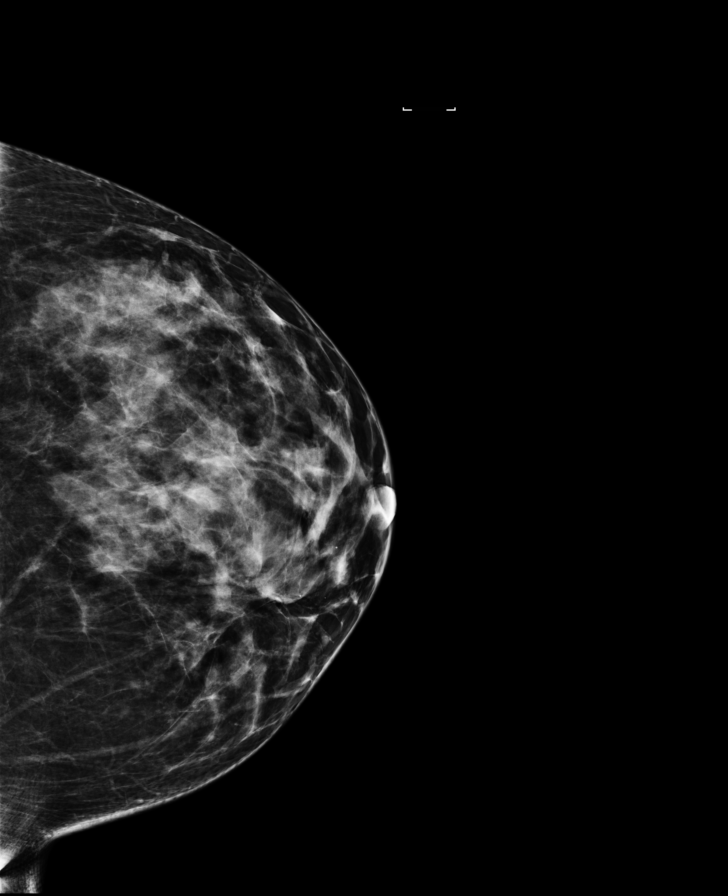

[R MLO synth-2D]
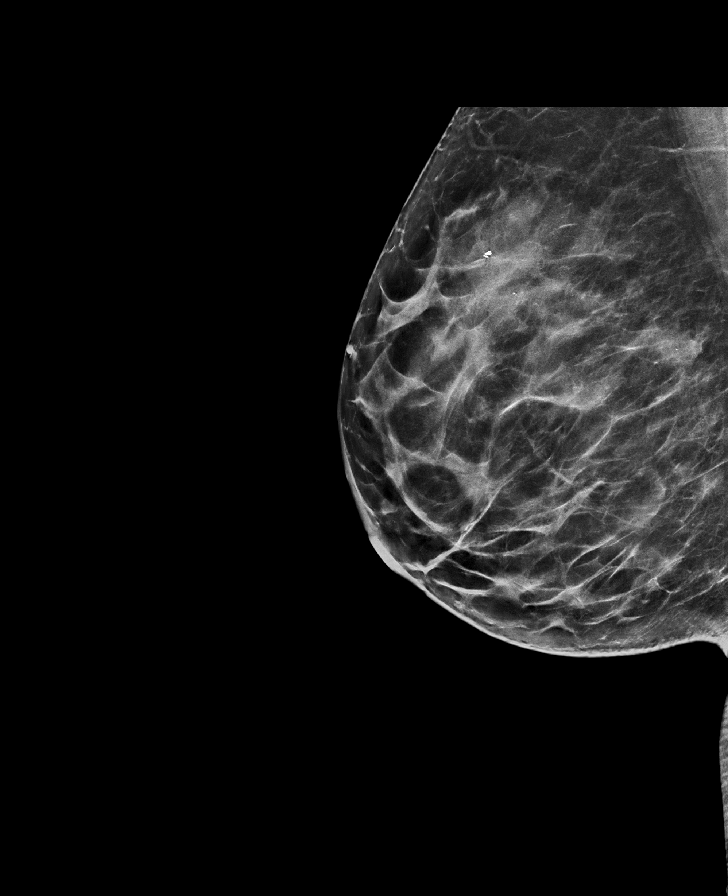

[L MLO]
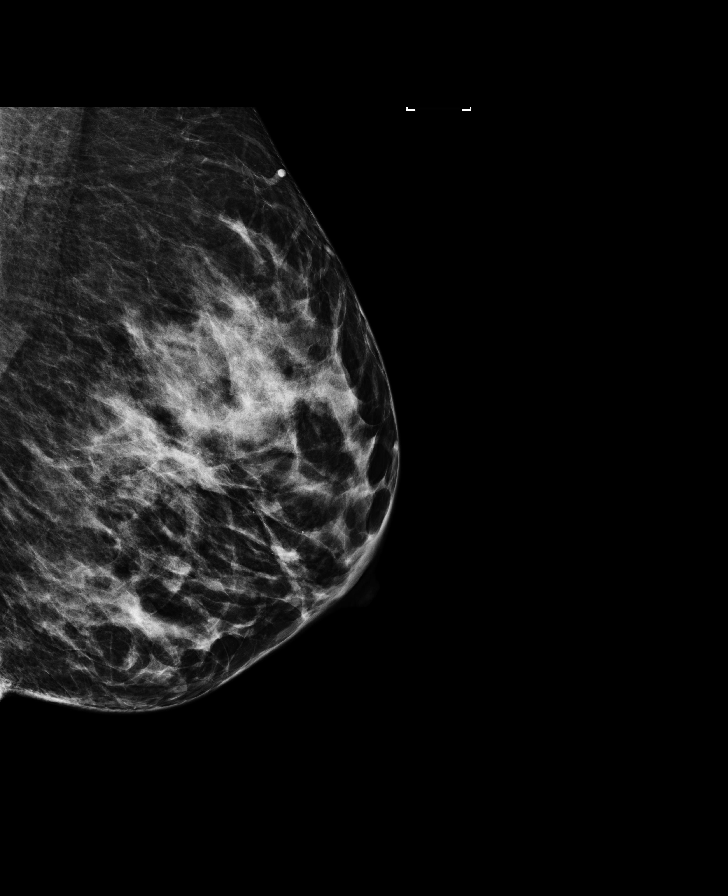

[8 of 28 positions shown; findings below may reference images not displayed]

ACR Breast Density Category c: The breast tissue is heterogeneously
dense, which may obscure small masses.
FINDINGS: There are no findings suspicious for malignancy. Images were
processed with CAD.
IMPRESSION: No mammographic evidence of malignancy. A result letter of this
screening mammogram will be mailed directly to the patient.

RECOMMENDATION:
Screening mammogram in one year. (Code:[R5])

BI-RADS CATEGORY  2: Benign.

## 2016-02-20 NOTE — Progress Notes (Signed)
   Subjective:    Patient ID: Diana Tate, female    DOB: 1971/03/09, 45 y.o.   MRN: [id number]  HPI Patient in today to have forms filled out for insurance for sleep apnea device. Never able to find a msk that work hasnt used for 5 years Tried nasal with chin strap States no other concerns this visit.  This patient was diagnosed with sleep apnea back in 2006. She was seen Dr. Annamaria Boots. She tried multiple straps she tried multiple mask and nasal prongs never found one that she could use effectively. Patient has significant troubles with sleep apnea on a regular basis with daytime tiredness and fatigue.  She saw her dentist who recommended a oral apparatus. Her recent sleep evaluation does show AHI of 10.6. She does have daytime symptoms. Oral apparatus would be indicated.  Review of Systems  Constitutional: Positive for fatigue. Negative for fever.  HENT: Negative for congestion.   Respiratory: Negative for cough and shortness of breath.   Cardiovascular: Negative for chest pain.  Gastrointestinal: Negative for abdominal pain.       Objective:   Physical Exam  Constitutional: She appears well-developed.  HENT:  Head: Normocephalic.  Cardiovascular: Normal rate, regular rhythm and normal heart sounds.   Pulmonary/Chest: Effort normal and breath sounds normal. No respiratory distress.          Assessment & Plan:  Sleep apnea-failure to tolerate CPAP machine. Needs treatment. Oral apparatus is indicated. Forms were filled out.a copy of the recent sleep evaluation was obtained for the purposes of documentation. Scanned into the system. Patient needs to do wellness on a yearly basis. If she has ongoing troubles she is notify us.

## 2016-04-01 ENCOUNTER — Other Ambulatory Visit: Payer: Self-pay | Admitting: Nurse Practitioner

## 2016-04-30 ENCOUNTER — Ambulatory Visit (INDEPENDENT_AMBULATORY_CARE_PROVIDER_SITE_OTHER): Payer: 59 | Admitting: Nurse Practitioner

## 2016-04-30 VITALS — BP 128/86 | Ht 66.0 in | Wt 189.4 lb

## 2016-04-30 DIAGNOSIS — M545 Low back pain, unspecified: Secondary | ICD-10-CM

## 2016-04-30 DIAGNOSIS — S39012A Strain of muscle, fascia and tendon of lower back, initial encounter: Secondary | ICD-10-CM

## 2016-04-30 MED ORDER — HYDROCODONE-ACETAMINOPHEN 5-325 MG PO TABS: 1.0000 | ORAL_TABLET | Freq: Four times a day (QID) | ORAL | 0 refills | Status: DC | PRN

## 2016-04-30 MED ORDER — DICLOFENAC SODIUM 75 MG PO TBEC: 75.0000 mg | DELAYED_RELEASE_TABLET | Freq: Two times a day (BID) | ORAL | 1 refills | Status: DC

## 2016-04-30 MED ORDER — CHLORZOXAZONE 500 MG PO TABS: 500.0000 mg | ORAL_TABLET | Freq: Three times a day (TID) | ORAL | 0 refills | Status: DC | PRN

## 2016-04-30 NOTE — Patient Instructions (Signed)
Lidocaine patch Icy Hot Smart Relief OTC TENS unit Back exercises

## 2016-05-01 ENCOUNTER — Encounter: Payer: Self-pay | Admitting: Nurse Practitioner

## 2016-05-01 NOTE — Progress Notes (Signed)
Subjective:  Presents for c/o right sided low back pain. Began over a month ago but had been improving until she was pulling on a trampoline over the weekend to move it. Minimal relief with NSAIDs, heat and ice. Radiates into the hip area. No numbness or weakness of the right leg. No abdominal pain. No change in bowel or bladder habits.   Objective:   BP 128/86   Ht 5\' 6"  (1.676 m)   Wt 189 lb 6.4 oz (85.9 kg)   BMI 30.57 kg/m  NAD. Alert, oriented. Lungs clear. Heart RRR. Generalized tenderness lower thoracic to lumbar area right side. SLR neg bilat; reflexes normal lower extremities. Limited ROM of the back due to tenderness. Gait normal. Minimal tenderness with rotation of the right hip.   Assessment: Right-sided low back pain without sciatica - Plan: HYDROcodone-acetaminophen (NORCO/VICODIN) 5-325 MG tablet  Back strain, initial encounter   Plan:  Meds ordered this encounter  Medications  . diclofenac (VOLTAREN) 75 MG EC tablet    Sig: Take 1 tablet (75 mg total) by mouth 2 (two) times daily.    Dispense:  60 tablet    Refill:  1    Order Specific Question:   Supervising Provider    Answer:   Mikey Kirschner [[id number]]  . chlorzoxazone (PARAFON) 500 MG tablet    Sig: Take 1 tablet (500 mg total) by mouth 3 (three) times daily as needed for muscle spasms.    Dispense:  30 tablet    Refill:  0    Order Specific Question:   Supervising Provider    Answer:   Mikey Kirschner [[id number]]  . HYDROcodone-acetaminophen (NORCO/VICODIN) 5-325 MG tablet    Sig: Take 1 tablet by mouth every 6 (six) hours as needed for moderate pain.    Dispense:  15 tablet    Refill:  0    Order Specific Question:   Supervising Provider    Answer:   Mikey Kirschner [[id number]]   Use pain med sparingly. Drowsiness precautions.  Lidocaine patch Icy Hot Smart Relief OTC TENS unit Back exercises Call back in 7-10 days if no improvement, sooner if worse.

## 2016-07-06 ENCOUNTER — Other Ambulatory Visit: Payer: Self-pay | Admitting: Nurse Practitioner

## 2017-04-17 ENCOUNTER — Other Ambulatory Visit: Payer: Self-pay | Admitting: Nurse Practitioner

## 2017-05-28 ENCOUNTER — Encounter: Payer: Self-pay | Admitting: Family Medicine

## 2017-05-28 ENCOUNTER — Ambulatory Visit (INDEPENDENT_AMBULATORY_CARE_PROVIDER_SITE_OTHER): Payer: Self-pay | Admitting: Family Medicine

## 2017-05-28 VITALS — BP 124/86 | Temp 98.4°F | Ht 66.0 in | Wt 182.0 lb

## 2017-05-28 DIAGNOSIS — M5412 Radiculopathy, cervical region: Secondary | ICD-10-CM

## 2017-05-28 MED ORDER — HYDROCODONE-ACETAMINOPHEN 5-325 MG PO TABS: 1.0000 | ORAL_TABLET | Freq: Four times a day (QID) | ORAL | 0 refills | Status: DC | PRN

## 2017-05-28 MED ORDER — TIZANIDINE HCL 4 MG PO TABS: ORAL_TABLET | ORAL | 0 refills | Status: DC

## 2017-05-28 MED ORDER — DICLOFENAC SODIUM 75 MG PO TBEC: 75.0000 mg | DELAYED_RELEASE_TABLET | Freq: Two times a day (BID) | ORAL | 1 refills | Status: DC

## 2017-05-28 NOTE — Progress Notes (Signed)
   Subjective:    Patient ID: Diana Tate, female    DOB: 08-07-1971, 46 y.o.   MRN: [id number]  Sinusitis  This is a new problem. Episode onset: 6 days. Associated symptoms include ear pain and headaches. Pertinent negatives include no congestion, coughing or shortness of breath. (Stiff neck, light headed, nausea) Treatments tried: advil, sudafed.   She denies any photophobia she denies vomiting the headaches do not wake her up at night the headaches of been present over the past several days fairly consistently but to some degree feel like a pressure other times it ache do not cause vomiting. Once again do not wake her up at night.   Review of Systems  Constitutional: Negative for activity change, fatigue and fever.  HENT: Positive for ear pain. Negative for congestion.   Respiratory: Negative for cough, chest tightness and shortness of breath.   Cardiovascular: Negative for chest pain and leg swelling.  Gastrointestinal: Negative for abdominal pain.  Skin: Negative for color change.  Neurological: Positive for headaches.  Psychiatric/Behavioral: Negative for behavioral problems.       Objective:   Physical Exam  Constitutional: She appears well-nourished. No distress.  HENT:  Head: Normocephalic.  Right Ear: External ear normal.  Left Ear: External ear normal.  Eyes: Right eye exhibits no discharge. Left eye exhibits no discharge.  Neck: No tracheal deviation present.  Cardiovascular: Normal rate, regular rhythm and normal heart sounds.   No murmur heard. Pulmonary/Chest: Effort normal and breath sounds normal. No respiratory distress. She has no wheezes. She has no rales.  Musculoskeletal: She exhibits no edema.  Lymphadenopathy:    She has no cervical adenopathy.  Neurological: She is alert.  Psychiatric: Her behavior is normal.  Vitals reviewed. She does have tenderness in the back part of her neck but her neck is supple does not appear to be meningitis more likely  that this is muscle inflammation with some tenderness in the occipital nerve region. No history of a tick bite but [place] weeks ago but she does not recall being sick at that time-I doubt mosquito encephalitis  No red flags I do not feel imaging necessary currently but if her headaches persist she will need further evaluation the patient is aware to give [place] updates regarding how she is feeling I find no evidence of a sinus infection.     Assessment & Plan:  I believe patient is having headaches related to musculoskeletal tightness in the neck that is causing some cervical neuralgia I believe the patient would benefit from muscle relaxer when she is at home plus anti-inflammatory.  I do not find any evidence of a bacterial or viral component but I told the patient if she starts having fever chills body aches sweats or unusual rash she needs notify us right away.

## 2017-07-02 ENCOUNTER — Other Ambulatory Visit: Payer: Self-pay | Admitting: Family Medicine

## 2017-12-01 ENCOUNTER — Encounter: Payer: Self-pay | Admitting: Family Medicine

## 2017-12-01 ENCOUNTER — Ambulatory Visit (INDEPENDENT_AMBULATORY_CARE_PROVIDER_SITE_OTHER): Payer: Self-pay | Admitting: Family Medicine

## 2017-12-01 ENCOUNTER — Other Ambulatory Visit (HOSPITAL_COMMUNITY)
Admission: RE | Admit: 2017-12-01 | Discharge: 2017-12-01 | Disposition: A | Discharge status: Home / Self Care | Payer: 59 | Source: Ambulatory Visit | Attending: Family Medicine | Admitting: Family Medicine

## 2017-12-01 VITALS — BP 120/82 | Temp 98.5°F | Ht 66.0 in | Wt 192.8 lb

## 2017-12-01 DIAGNOSIS — R109 Unspecified abdominal pain: Secondary | ICD-10-CM

## 2017-12-01 DIAGNOSIS — R3915 Urgency of urination: Secondary | ICD-10-CM

## 2017-12-01 LAB — CBC WITH DIFFERENTIAL/PLATELET
Basophils Absolute: 0 10*3/uL (ref 0.0–0.1)
Basophils Relative: 0 %
Eosinophils Absolute: 0.1 10*3/uL (ref 0.0–0.7)
Eosinophils Relative: 2 %
HEMATOCRIT: 41.7 % (ref 36.0–46.0)
HEMOGLOBIN: 13.1 g/dL (ref 12.0–15.0)
LYMPHS ABS: 2 10*3/uL (ref 0.7–4.0)
LYMPHS PCT: 27 %
MCH: 27.6 pg (ref 26.0–34.0)
MCHC: 31.4 g/dL (ref 30.0–36.0)
MCV: 87.8 fL (ref 78.0–100.0)
MONOS PCT: 11 %
Monocytes Absolute: 0.8 10*3/uL (ref 0.1–1.0)
NEUTROS ABS: 4.5 10*3/uL (ref 1.7–7.7)
NEUTROS PCT: 60 %
Platelets: 255 10*3/uL (ref 150–400)
RBC: 4.75 MIL/uL (ref 3.87–5.11)
RDW: 13.8 % (ref 11.5–15.5)
WBC: 7.4 10*3/uL (ref 4.0–10.5)

## 2017-12-01 LAB — LIPID PANEL
CHOLESTEROL: 221 mg/dL — AB (ref 0–200)
HDL: 60 mg/dL (ref >40)
LDL Cholesterol: 131 mg/dL — ABNORMAL HIGH (ref 0–99)
Total CHOL/HDL Ratio: 3.7 RATIO
Triglycerides: 149 mg/dL (ref <150)
VLDL: 30 mg/dL (ref 0–40)

## 2017-12-01 LAB — BASIC METABOLIC PANEL
Anion gap: 8 (ref 5–15)
BUN: 14 mg/dL (ref 6–20)
CHLORIDE: 103 mmol/L (ref 101–111)
CO2: 26 mmol/L (ref 22–32)
CREATININE: 0.71 mg/dL (ref 0.44–1.00)
Calcium: 9.1 mg/dL (ref 8.9–10.3)
GFR calc Af Amer: >60 mL/min (ref >60)
GFR calc non Af Amer: >60 mL/min (ref >60)
Glucose, Bld: 87 mg/dL (ref 65–99)
POTASSIUM: 4 mmol/L (ref 3.5–5.1)
Sodium: 137 mmol/L (ref 135–145)

## 2017-12-01 LAB — HEPATIC FUNCTION PANEL
ALK PHOS: 56 U/L (ref 38–126)
ALT: 15 U/L (ref 14–54)
AST: 15 U/L (ref 15–41)
Albumin: 3.8 g/dL (ref 3.5–5.0)
TOTAL PROTEIN: 7.2 g/dL (ref 6.5–8.1)
Total Bilirubin: 0.4 mg/dL (ref 0.3–1.2)

## 2017-12-01 LAB — POCT URINALYSIS DIPSTICK
PH UA: 6 (ref 5.0–8.0)
Spec Grav, UA: 1.02 (ref 1.010–1.025)

## 2017-12-01 MED ORDER — CIPROFLOXACIN HCL 500 MG PO TABS: 500.0000 mg | ORAL_TABLET | Freq: Two times a day (BID) | ORAL | 0 refills | Status: AC

## 2017-12-01 NOTE — Progress Notes (Signed)
   Subjective:    Patient ID: Diana Tate, female    DOB: Sep 15, 1970, 47 y.o.   MRN: [id number]  Urinary Tract Infection   This is a new problem. The current episode started yesterday. Quality: throbbing pain in back. Associated symptoms include flank pain and frequency. Associated symptoms comments: Abdominal pain, frequent urination. Treatments tried: Azo, cranberry juice.  Patient states that her urine yesterday was cloudy and she started drinking cranberry juice.  She denies any dysuria she has had some frequency urgency no nausea vomiting diarrhea.  No bloody stools. Pt states the pain on right side of abdomen feels like she has been holding her bladder a long time. Results for orders placed or performed in visit on 12/01/17  POCT Urinalysis Dipstick  Result Value Ref Range   Color, UA     Clarity, UA     Glucose, UA     Bilirubin, UA     Ketones, UA     Spec Grav, UA 1.020 1.010 - 1.025   Blood, UA     pH, UA 6.0 5.0 - 8.0   Protein, UA     Urobilinogen, UA  0.2 or 1.0 E.U./dL   Nitrite, UA     Leukocytes, UA  Negative   Appearance     Odor      Review of Systems  Constitutional: Negative for fatigue and fever.  HENT: Negative for congestion and rhinorrhea.   Cardiovascular: Negative for chest pain and leg swelling.  Gastrointestinal: Positive for abdominal pain. Negative for constipation and diarrhea.  Genitourinary: Positive for difficulty urinating, dysuria, flank pain and frequency.       Objective:   Physical Exam  Constitutional: She appears well-developed.  HENT:  Head: Normocephalic.  Nose: Nose normal.  Mouth/Throat: Oropharynx is clear and moist. No oropharyngeal exudate.  Eyes: Right eye exhibits no discharge. Left eye exhibits no discharge.  Neck: Neck supple. No tracheal deviation present.  Cardiovascular: Normal rate and normal heart sounds.  No murmur heard. Pulmonary/Chest: Effort normal and breath sounds normal. She has no wheezes. She has no  rales.  Abdominal: There is no tenderness. There is no rebound.  Lymphadenopathy:    She has no cervical adenopathy.  Skin: Skin is warm and dry.  Nursing note and vitals reviewed.  Patient does not into the right flank region some discomfort with percussing of the kidney region on the right side  Urinalysis with occasional WBC     Assessment & Plan:  Right-sided abdominal pain Possible UTI Urine culture Antibiotic prescribed Await the findings of her lab work Stat lab work ordered Patient was instructed should she start having severe abdominal pain or worsening rash she needs to follow-up immediately  Should be noted that her lab work is her kidney function liver function was good white blood cell count was good The patient agrees to give [place] update Thursday morning how she is doing she will let us know if having progressive troubles may need to do CT scan or referral

## 2017-12-03 LAB — URINE CULTURE

## 2017-12-03 LAB — SPECIMEN STATUS REPORT

## 2018-05-04 ENCOUNTER — Other Ambulatory Visit: Payer: Self-pay | Admitting: Obstetrics and Gynecology

## 2018-05-04 DIAGNOSIS — R928 Other abnormal and inconclusive findings on diagnostic imaging of breast: Secondary | ICD-10-CM

## 2018-05-05 ENCOUNTER — Other Ambulatory Visit: Payer: Self-pay | Admitting: Family Medicine

## 2018-05-11 ENCOUNTER — Other Ambulatory Visit: Payer: Self-pay | Admitting: Obstetrics and Gynecology

## 2018-05-11 ENCOUNTER — Ambulatory Visit
Admission: RE | Admit: 2018-05-11 | Discharge: 2018-05-11 | Disposition: A | Discharge status: Home / Self Care | Payer: No Typology Code available for payment source | Source: Ambulatory Visit | Attending: Obstetrics and Gynecology | Admitting: Obstetrics and Gynecology

## 2018-05-11 DIAGNOSIS — R928 Other abnormal and inconclusive findings on diagnostic imaging of breast: Secondary | ICD-10-CM

## 2018-05-11 DIAGNOSIS — R921 Mammographic calcification found on diagnostic imaging of breast: Secondary | ICD-10-CM

## 2018-05-11 IMAGING — MG DIGITAL DIAGNOSTIC BILATERAL MAMMOGRAM WITH CAD
8 series · 8 of 8 positions shown · non-contrast
Comparison: Previous exam(s).

CLINICAL DATA: 47-year-old female for further evaluation of
bilateral breast calcifications identified on screening mammogram.
History of previous LEFT breast ADH and excision.

EXAM:
DIGITAL DIAGNOSTIC BILATERAL MAMMOGRAM WITH CAD

[L CC (1 of 2)]
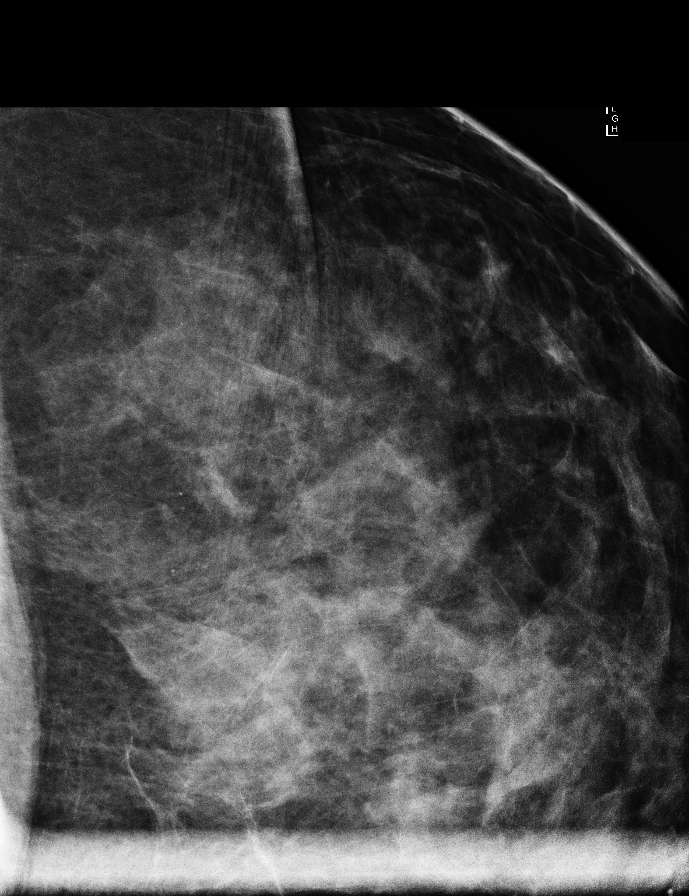

[L ML (1 of 3)]
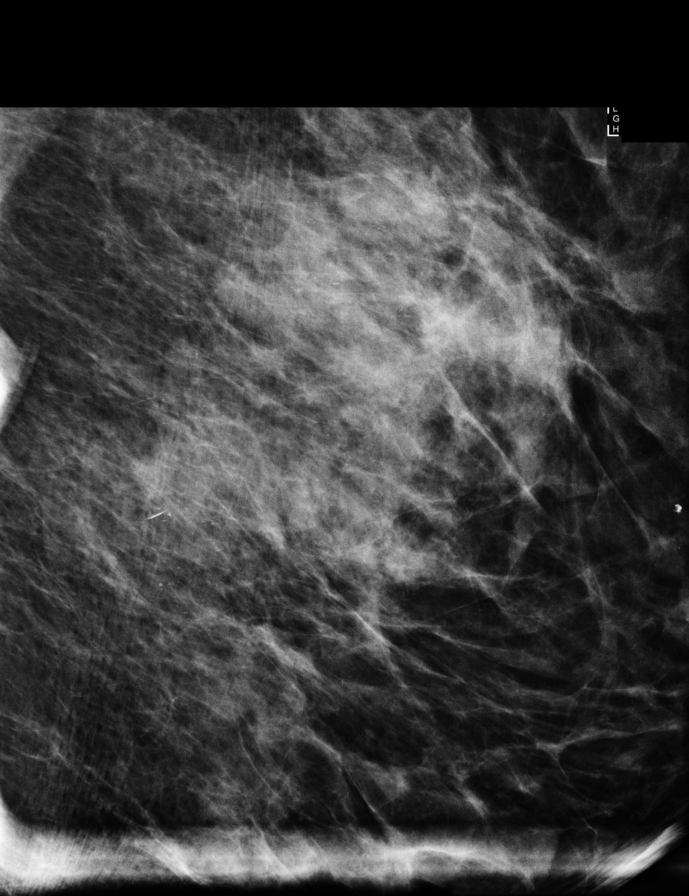

[L ML (2 of 3)]
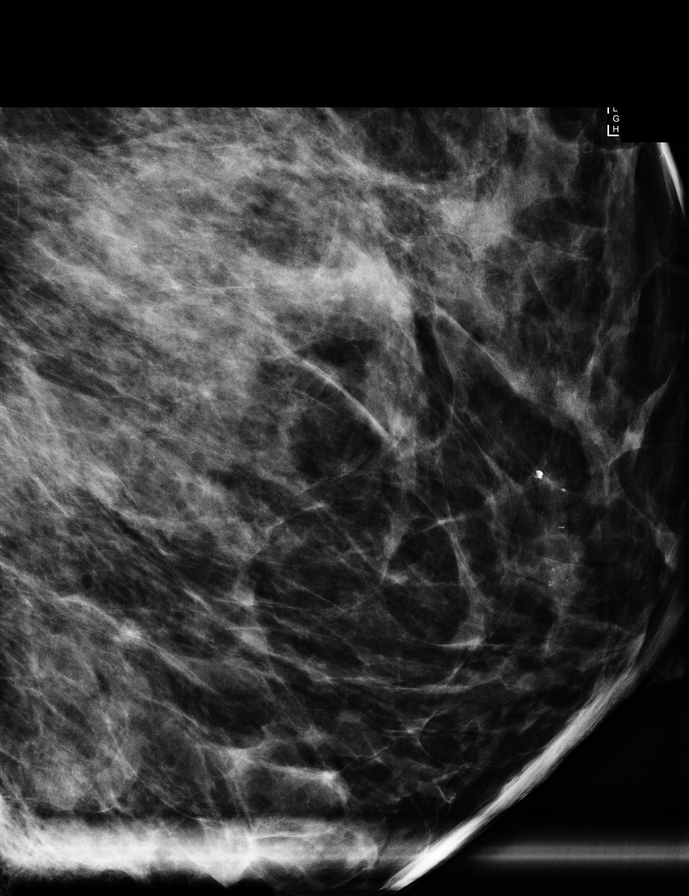

[L ML (3 of 3)]
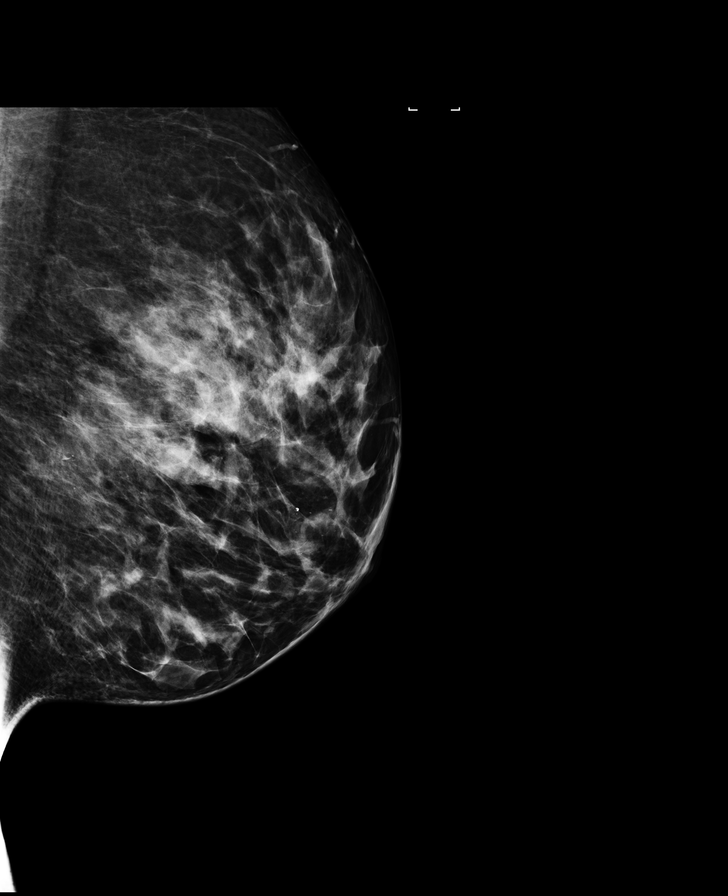

[R ML (1 of 2)]
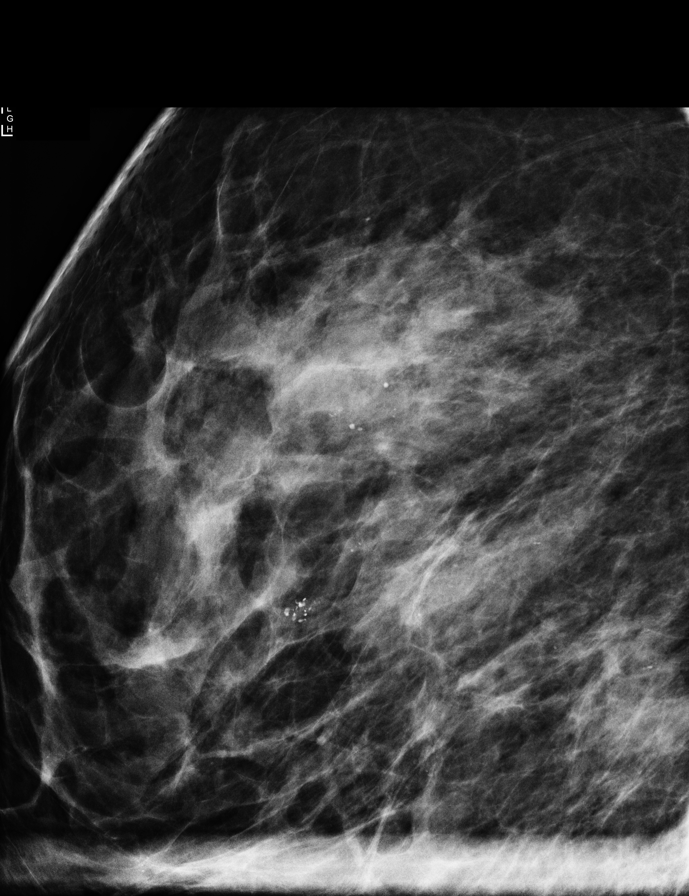

[R CC]
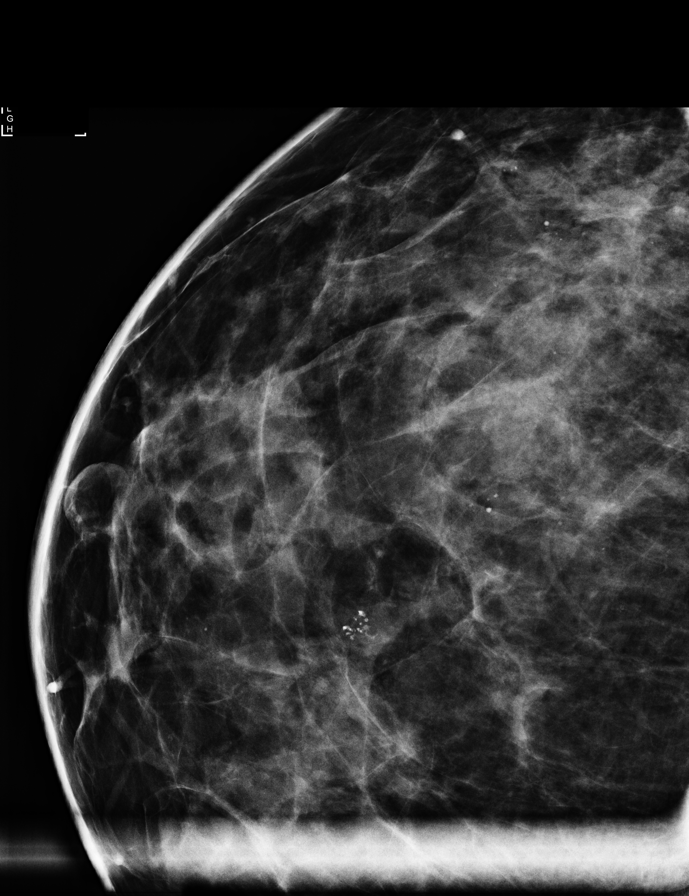

[L CC (2 of 2)]
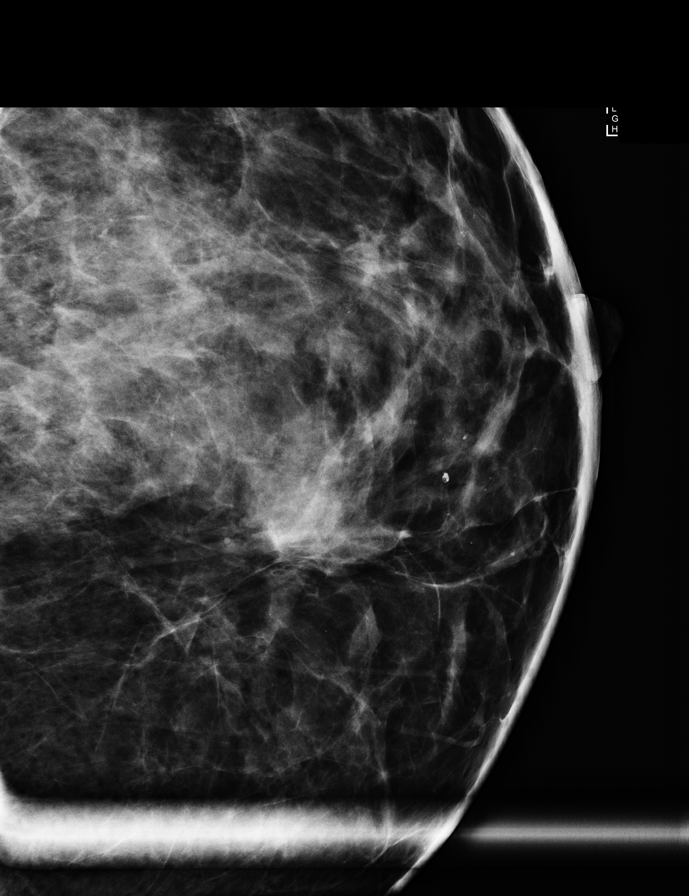

[R ML (2 of 2)]
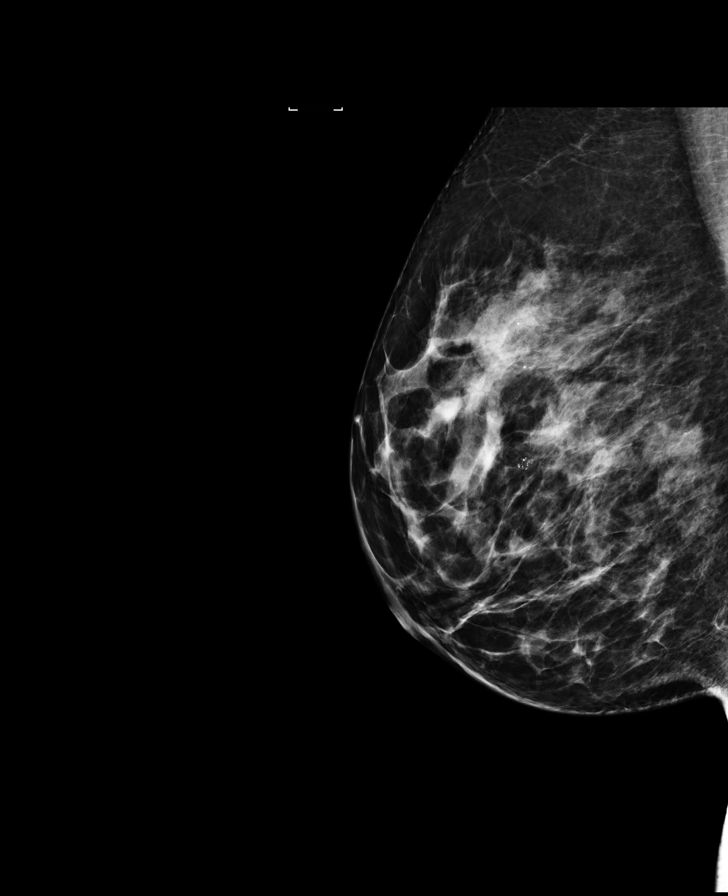

[8 of 8 positions shown; findings below may reference images not displayed]

ACR Breast Density Category c: The breast tissue is heterogeneously
dense, which may obscure small masses.
FINDINGS: Full field and magnification views of both breasts are performed.

RIGHT BREAST:

A 6 mm group of heterogeneous calcifications within the UPPER INNER
RIGHT breast identified.

Other scattered round calcifications within the UPPER and OUTER
RIGHT breast are noted.

LEFT BREAST:

A 2 cm group of loose calcifications within the posterior
UPPER-OUTER LEFT breast are noted, some which layer on the LATERAL
view.

A 1.5 cm area of scattered primarily round calcifications within the
anterior INNER LEFT breast are identified.

Mammographic images were processed with CAD.
IMPRESSION: 1. Indeterminate UPPER INNER RIGHT breast calcifications. Tissue
sampling is recommended.
2. Likely benign and benign appearing LEFT breast calcifications. If
the RIGHT breast biopsy is benign, LEFT diagnostic mammogram in 6
months is recommended. If the RIGHT breast biopsy is malignant, then
breast MRI and/or tissue sampling of the more anterior group of LEFT
breast calcifications is suggested.

RECOMMENDATION:
Stereotactic guided RIGHT breast biopsy, which will be arranged.

LEFT breast recommendations as discussed above.

I have discussed the findings and recommendations with the patient.
Results were also provided in writing at the conclusion of the
visit. If applicable, a reminder letter will be sent to the patient
regarding the next appointment.

BI-RADS CATEGORY  4: Suspicious.

## 2018-05-14 ENCOUNTER — Ambulatory Visit
Admission: RE | Admit: 2018-05-14 | Discharge: 2018-05-14 | Disposition: A | Discharge status: Home / Self Care | Payer: No Typology Code available for payment source | Source: Ambulatory Visit | Attending: Obstetrics and Gynecology | Admitting: Obstetrics and Gynecology

## 2018-05-14 ENCOUNTER — Other Ambulatory Visit: Payer: Self-pay | Admitting: Obstetrics and Gynecology

## 2018-05-14 DIAGNOSIS — R921 Mammographic calcification found on diagnostic imaging of breast: Secondary | ICD-10-CM

## 2018-05-14 IMAGING — MG STEREOTACTIC VACUUM ASSIST RIGHT
8 series · 8 of 16 positions shown · non-contrast
Comparison: Previous exams.

ADDENDUM:
Pathology revealed FIBROCYSTIC CHANGES WITH CALCIFICATIONS of the
Right breast, upper inner. This was found to be concordant by Dr.
CHIZURU.

Pathology results were discussed with the patient by telephone. The
patient reported doing well after the biopsy with tenderness at the
site. Post biopsy instructions and care were reviewed and questions
were answered. The patient was encouraged to call The [REDACTED]
The patient was asked to return for left diagnostic mammography and
possible ultrasound in 6 months per Dr. CHIZURU recommendation and
informed a reminder notice would be sent regarding this appointment.
Pathology results reported by CHIZURU, RN on [DATE].
CLINICAL DATA: Right breast calcifications for biopsy.
EXAM:
RIGHT BREAST STEREOTACTIC CORE NEEDLE BIOPSY

[R (1 of 3)]
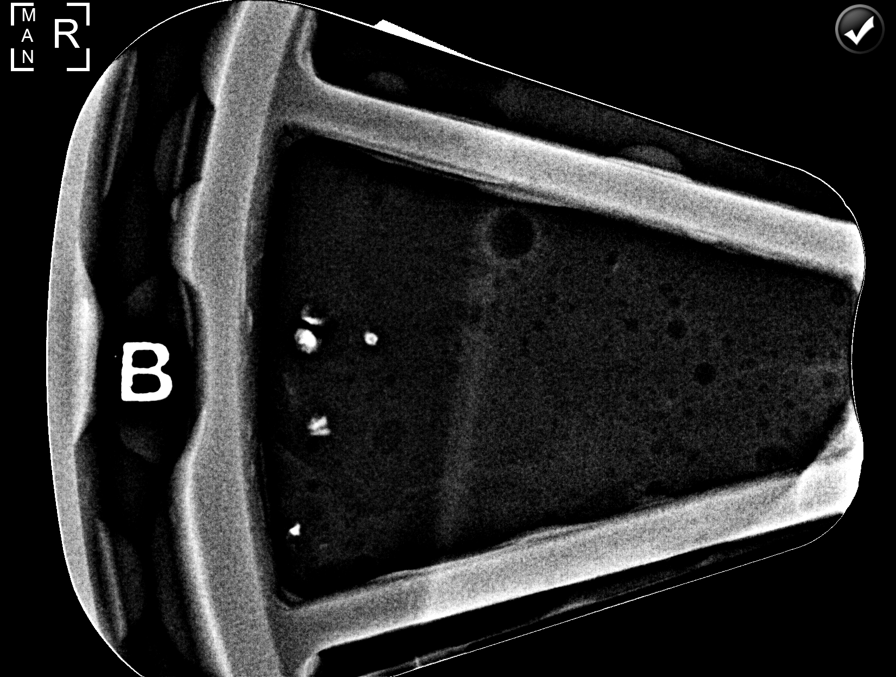

[R (2 of 3)]
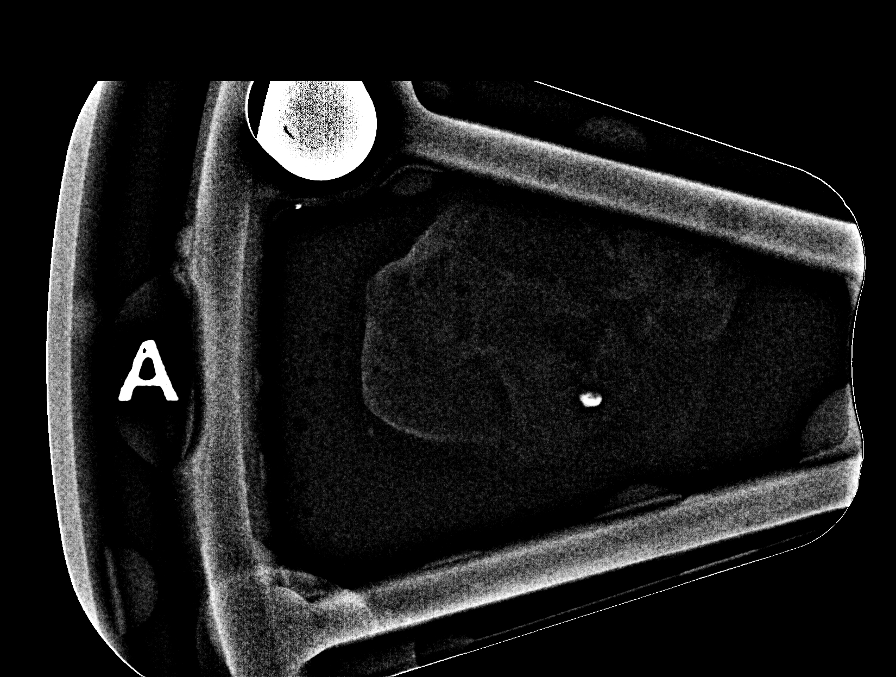

[R (3 of 3)]
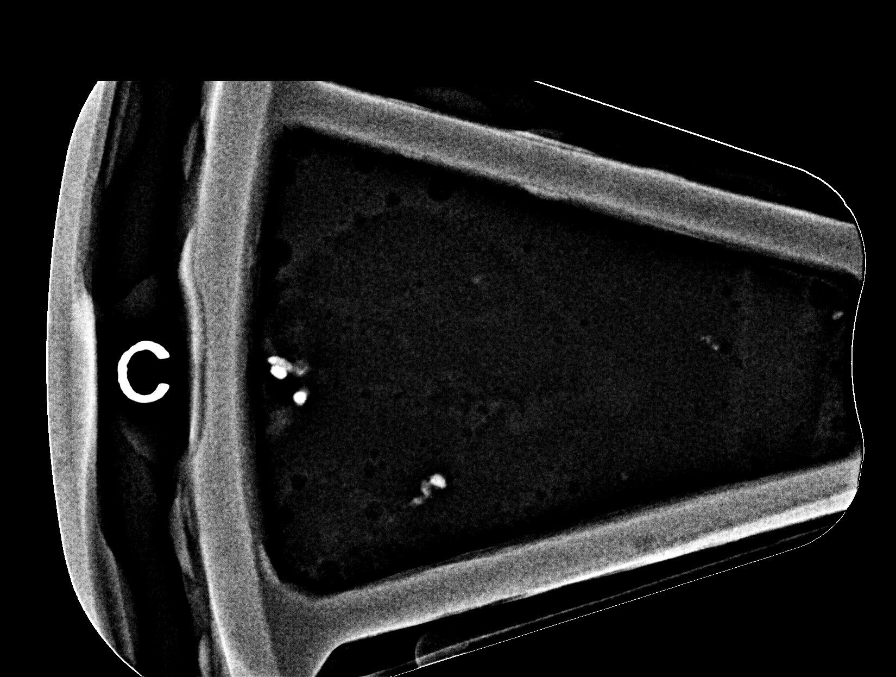

[R CC (1 of 3)]
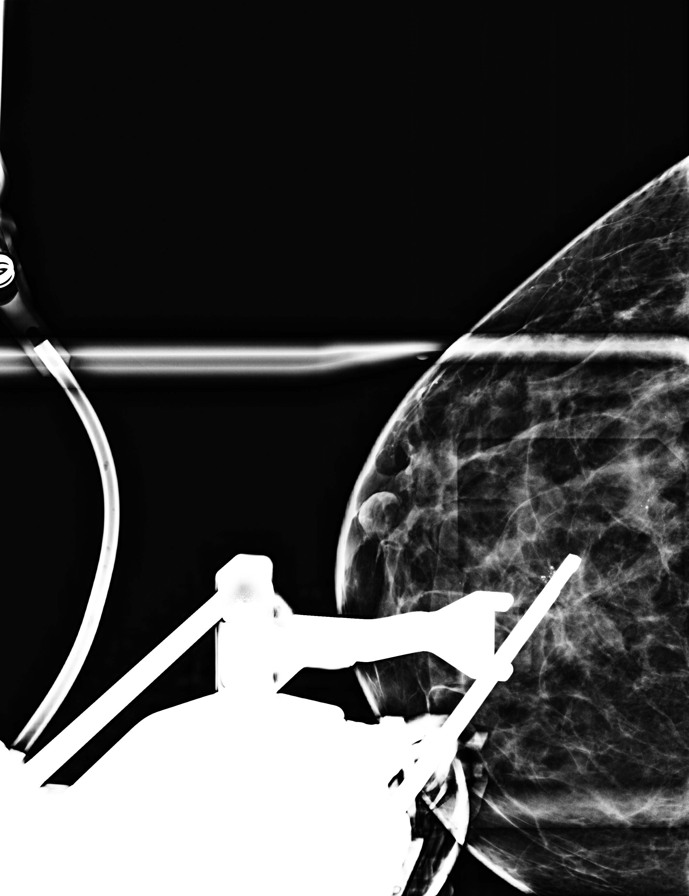

[R CC (2 of 3)]
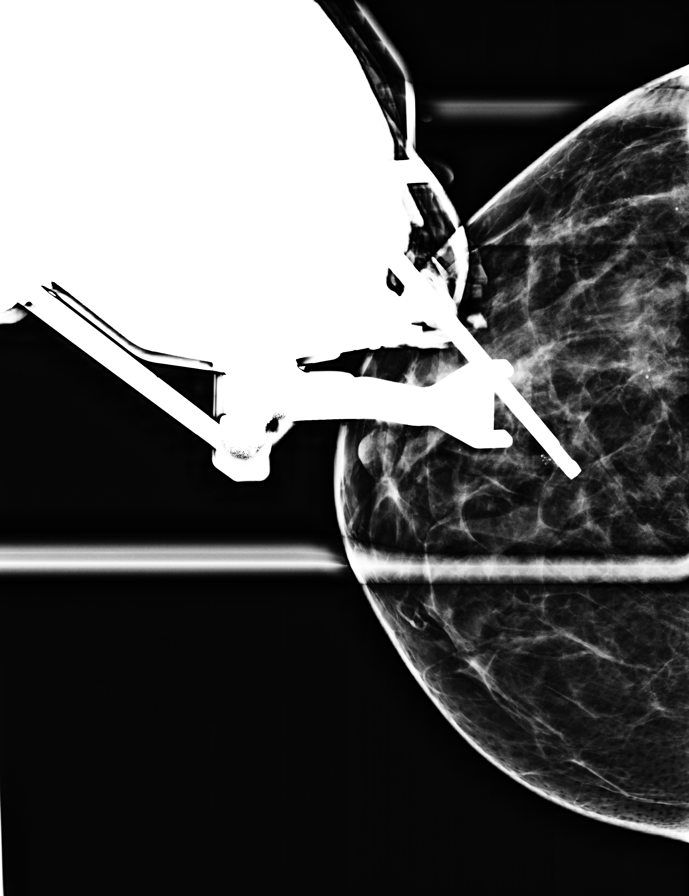

[R CC (3 of 3)]
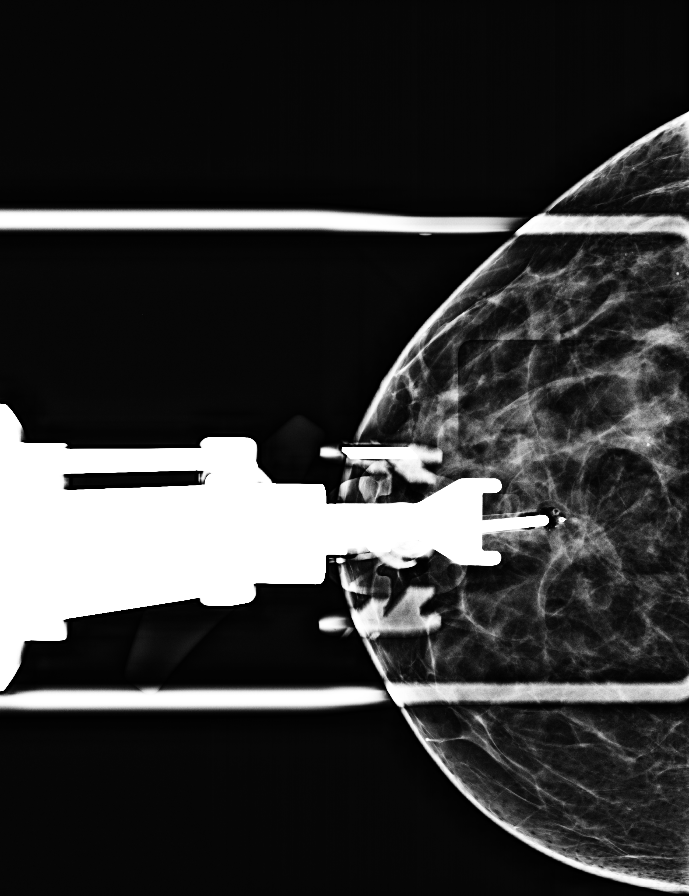

[R CC tomo (1 of 2) · tomo slice 33/64.0]
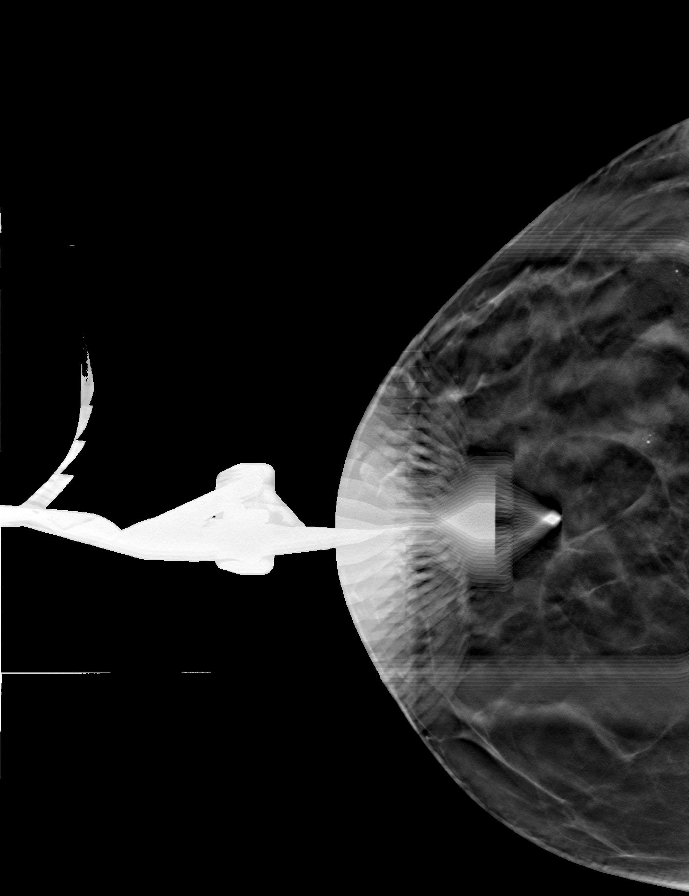

[R CC tomo (2 of 2) · tomo slice 33/64.0]
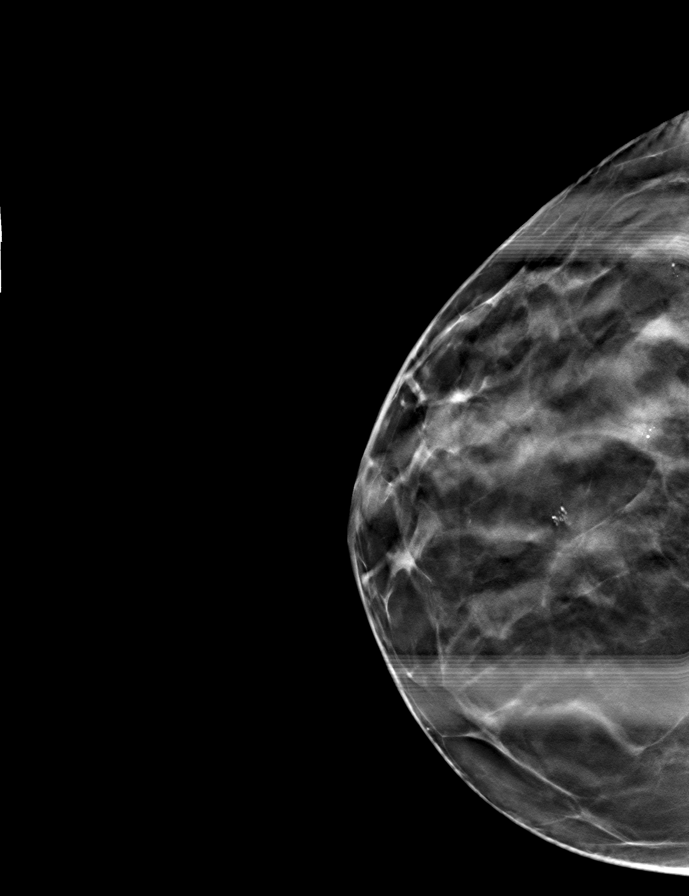

[8 of 16 positions shown; findings below may reference images not displayed]



Using sterile technique and 1% Lidocaine as local anesthetic, under
stereotactic guidance, a 9 gauge vacuum assisted device was used to
perform core needle biopsy of calcifications in the upper inner
quadrant right breast using a cranial approach. Specimen radiograph
was performed showing inclusion of calcifications of concern.
Specimens with calcifications are identified for pathology.

Lesion quadrant: Upper inner quadrant

At the conclusion of the procedure, a coil tissue marker clip was
deployed into the biopsy cavity. Follow-up 2-view mammogram was
performed and dictated separately.
IMPRESSION: Stereotactic-guided biopsy of right breast. No apparent
complications.

## 2018-05-14 IMAGING — MG MM CLIP PLACEMENT
2 series · 2 of 2 positions shown · non-contrast
Comparison: Previous exam(s).

CLINICAL DATA: Right breast calcifications for biopsy

EXAM:
DIAGNOSTIC RIGHT MAMMOGRAM POST STEREOTACTIC BIOPSY

[R ML]
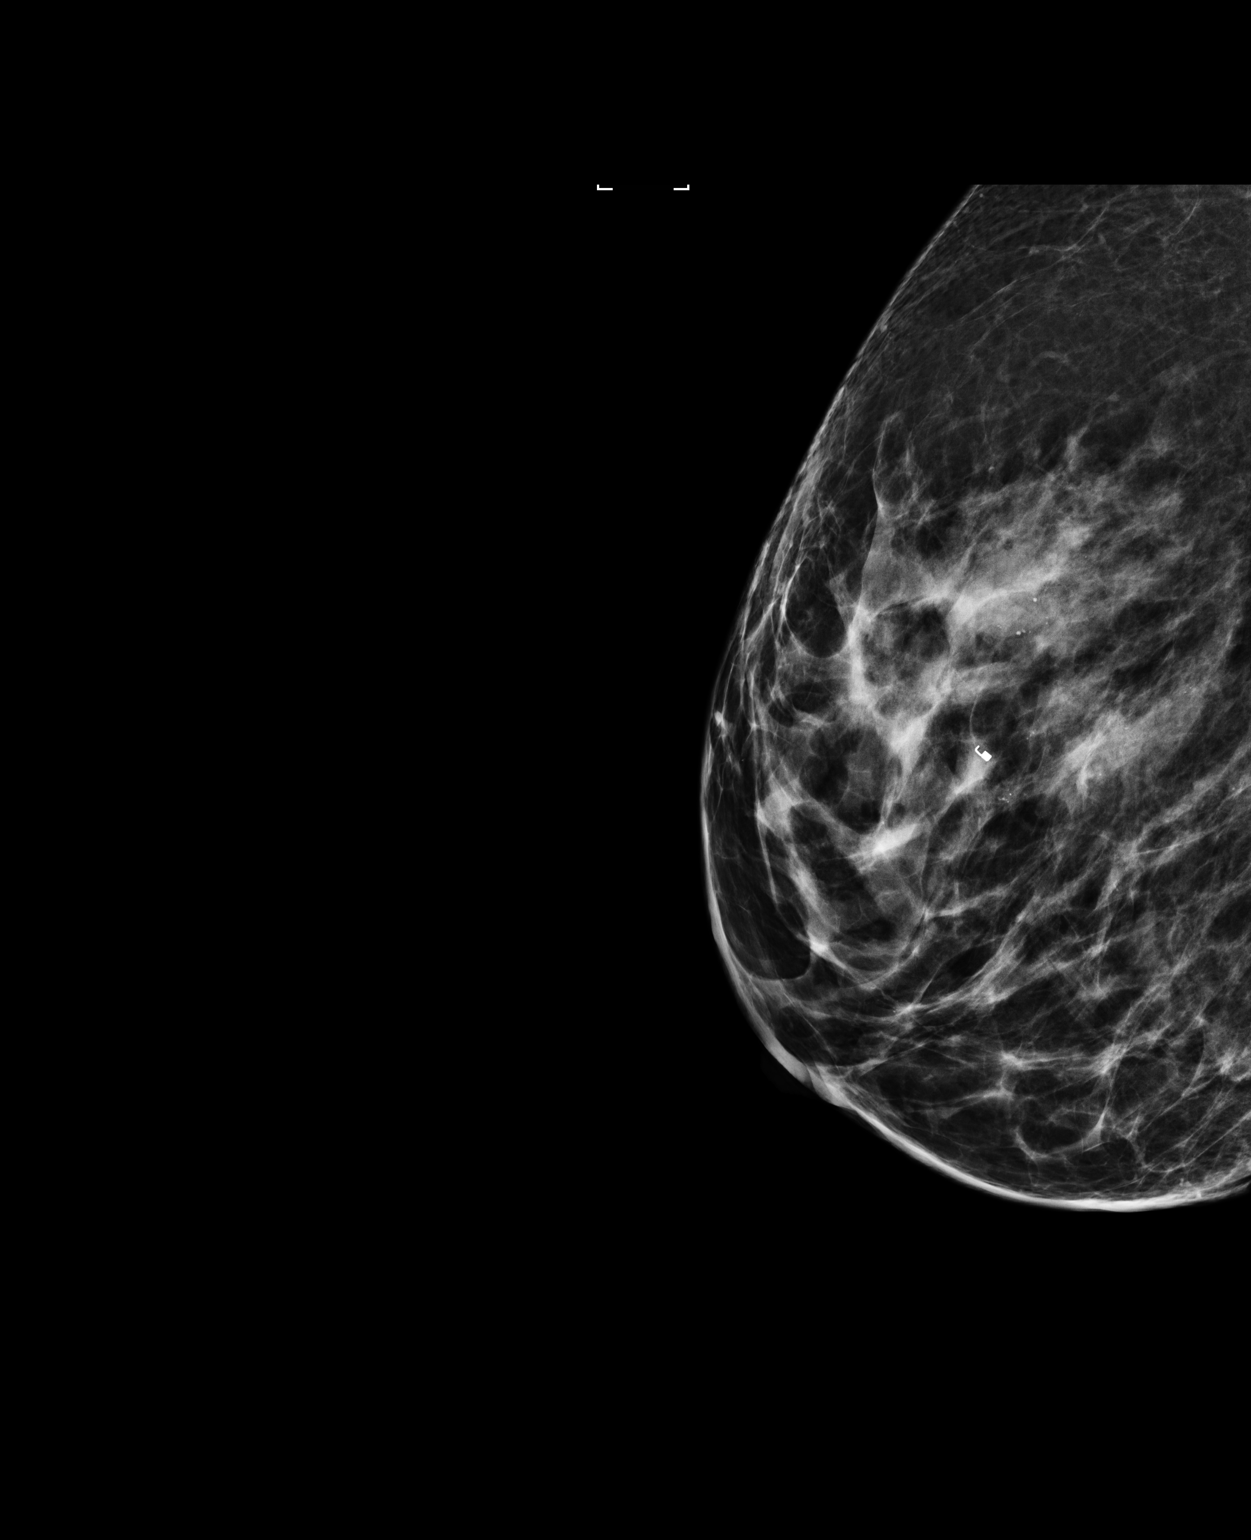

[R CC]
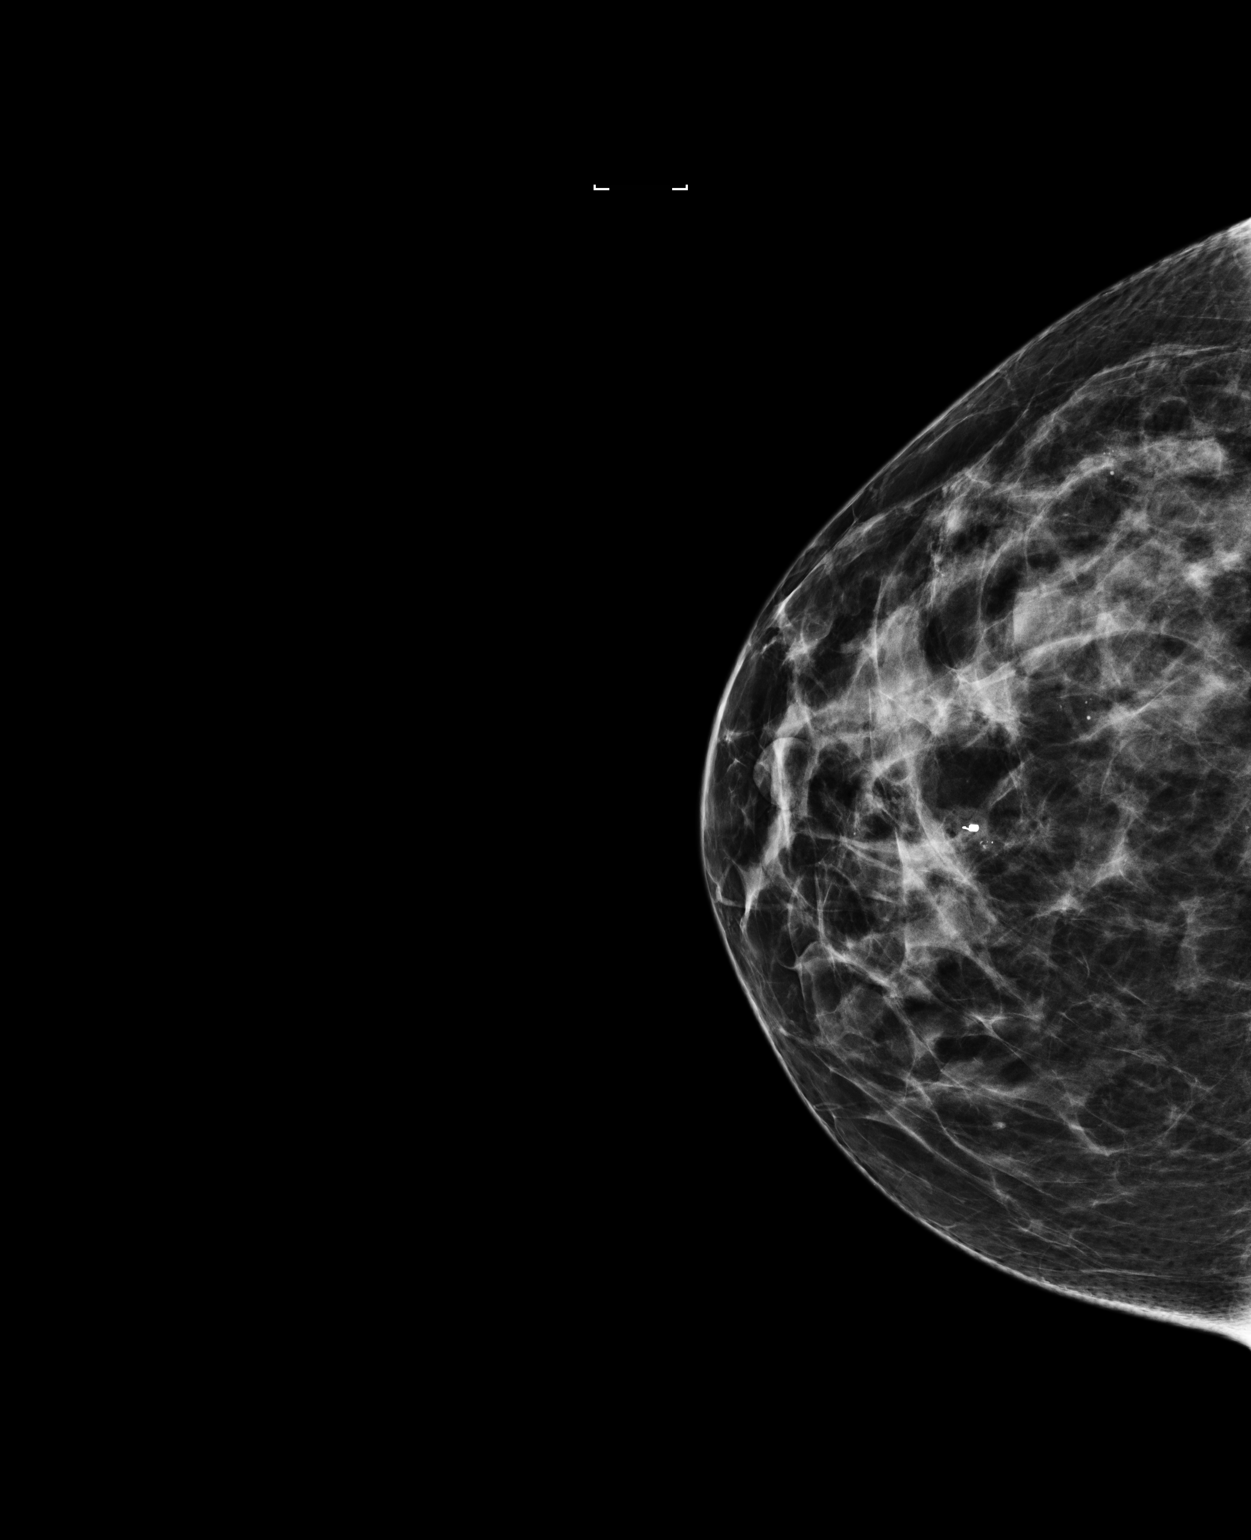

[2 of 2 positions shown; findings below may reference images not displayed]

FINDINGS: Mammographic images were obtained following right breast
stereotactic guided biopsy of calcifications in the upper inner
right breast. Cc and lateral views of the right breast demonstrate
coil biopsy clip 4 mm superior to the residual calcifications.
IMPRESSION: Post biopsy mammogram as described.

Final Assessment: Post Procedure Mammograms for Marker Placement

## 2018-05-18 ENCOUNTER — Telehealth: Payer: Self-pay | Admitting: Family Medicine

## 2018-05-18 NOTE — Telephone Encounter (Signed)
So noted 

## 2018-05-18 NOTE — Telephone Encounter (Signed)
I received a breast biopsy which was benign  Please talk with patient make sure that she is aware of the biopsy result #2-asked the patient who she follows with regarding getting her mammograms done (in other words is this the breast center?  Or her gynecologist?  Please document accordingly) And finally winded they recommend for her to follow-up with the breast center again. Essentially make sure we document that the patient is informed and that the patient is under the care of the breast center or her gynecologist for this issue since we did not order this

## 2018-05-18 NOTE — Telephone Encounter (Signed)
Discussed with pt. Pt is aware of results. The breast center is doing her follow ups and she follows up again in 6 months.

## 2018-06-10 NOTE — Progress Notes (Signed)
REVIEWED.  

## 2018-09-30 ENCOUNTER — Other Ambulatory Visit: Payer: Self-pay

## 2018-09-30 ENCOUNTER — Other Ambulatory Visit: Payer: Self-pay | Admitting: Family Medicine

## 2018-09-30 ENCOUNTER — Telehealth: Payer: Self-pay | Admitting: Family Medicine

## 2018-09-30 MED ORDER — SUCRALFATE 1 GM/10ML PO SUSP: ORAL | 0 refills | Status: DC

## 2018-09-30 MED ORDER — SUCRALFATE 1 GM/10ML PO SUSP: ORAL | 3 refills | Status: DC

## 2018-09-30 NOTE — Telephone Encounter (Signed)
Pt would like to have CARAFATE 1 GM/10ML suspension called in in the generic form. Her insurance now will cover the generic where years ago when it was prescribed they would not cover it at all.   Please send to [id number] - [place], [place] - [address]

## 2018-09-30 NOTE — Telephone Encounter (Signed)
May send in generic 10 mL q. before meals and nightly 30-day supply 3 refills

## 2018-09-30 NOTE — Telephone Encounter (Signed)
Please advise. Thank you

## 2018-09-30 NOTE — Telephone Encounter (Signed)
Medication sent in and pt is aware  

## 2018-10-28 ENCOUNTER — Other Ambulatory Visit: Payer: Self-pay | Admitting: Family Medicine

## 2018-11-04 ENCOUNTER — Other Ambulatory Visit: Payer: Self-pay | Admitting: Family Medicine

## 2018-12-29 ENCOUNTER — Other Ambulatory Visit: Payer: Self-pay | Admitting: Obstetrics and Gynecology

## 2018-12-29 DIAGNOSIS — R921 Mammographic calcification found on diagnostic imaging of breast: Secondary | ICD-10-CM

## 2019-01-26 ENCOUNTER — Ambulatory Visit: Payer: No Typology Code available for payment source

## 2019-01-26 ENCOUNTER — Ambulatory Visit
Admission: RE | Admit: 2019-01-26 | Discharge: 2019-01-26 | Disposition: A | Discharge status: Home / Self Care | Payer: BC Managed Care – PPO | Source: Ambulatory Visit | Attending: Obstetrics and Gynecology | Admitting: Obstetrics and Gynecology

## 2019-01-26 ENCOUNTER — Other Ambulatory Visit: Payer: Self-pay | Admitting: Obstetrics and Gynecology

## 2019-01-26 ENCOUNTER — Other Ambulatory Visit: Payer: Self-pay

## 2019-01-26 DIAGNOSIS — R921 Mammographic calcification found on diagnostic imaging of breast: Secondary | ICD-10-CM

## 2019-01-26 IMAGING — MG DIGITAL DIAGNOSTIC UNILATERAL LEFT MAMMOGRAM WITH TOMO AND CAD
8 series · 8 of 16 positions shown · non-contrast
Comparison: Previous exam(s).

CLINICAL DATA: Short-term follow-up for left breast calcifications.
The patient underwent stereotactic core needle biopsy of right
breast calcifications on [DATE] with benign concordant results,
fibrocystic changes with calcifications. Short-term follow-up was
recommended for the 2 groups of left breast calcifications.

EXAM:
DIGITAL DIAGNOSTIC UNILATERAL LEFT MAMMOGRAM WITH CAD AND TOMO

[L CC (1 of 2)]
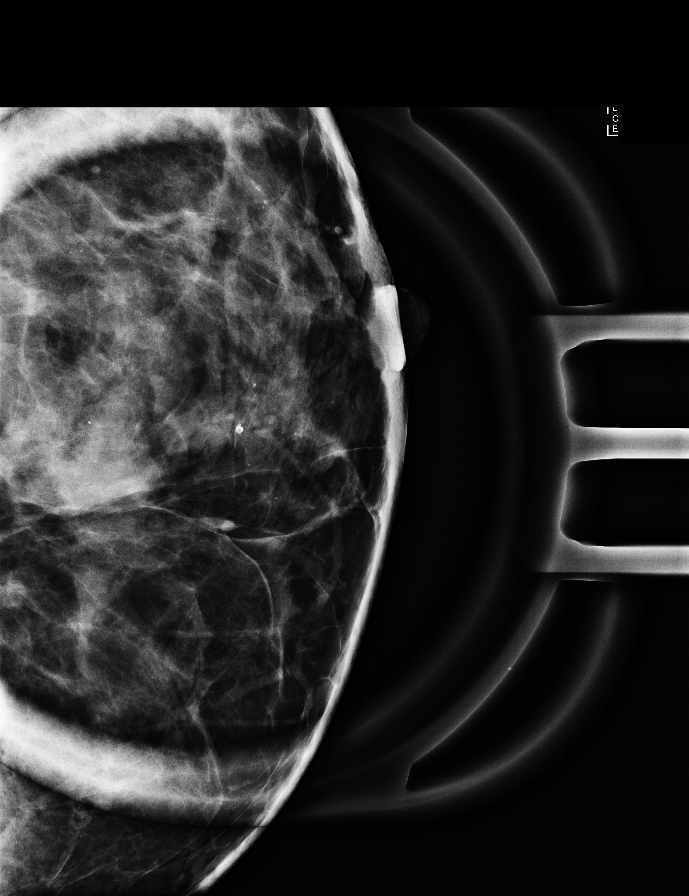

[L CC (2 of 2)]
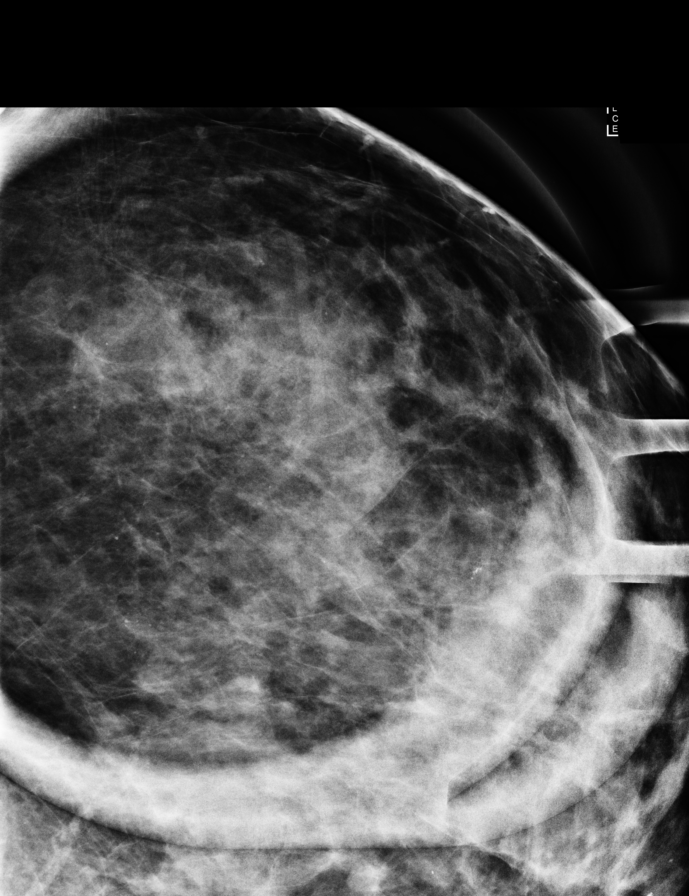

[L ML (1 of 2)]
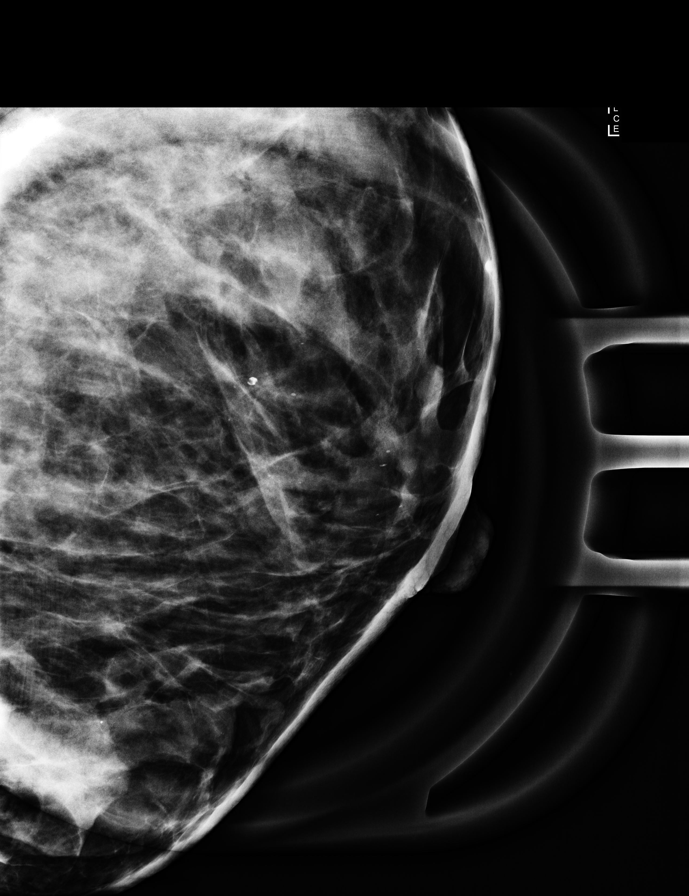

[L ML (2 of 2)]
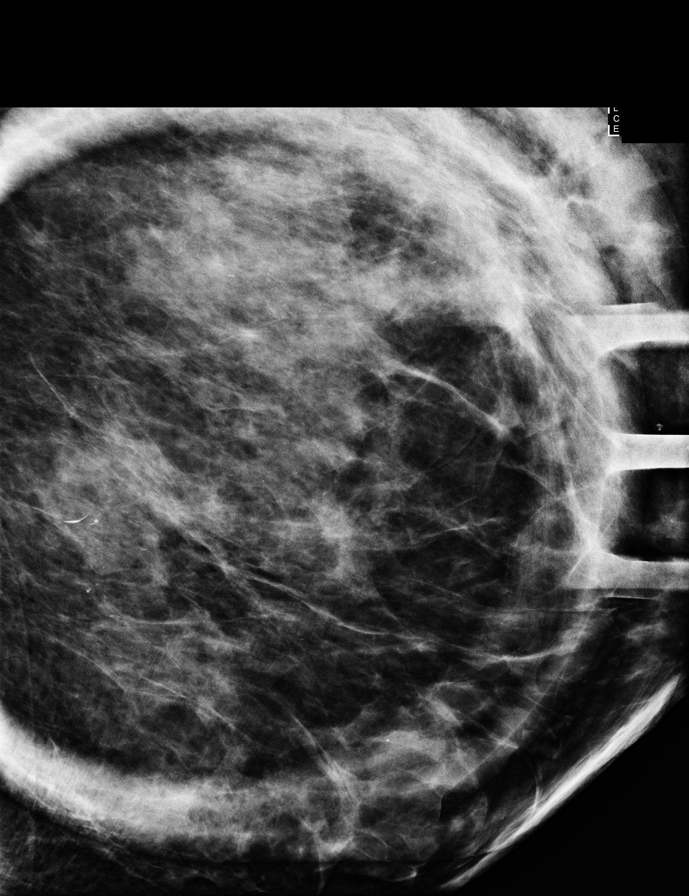

[L MLO synth-2D]
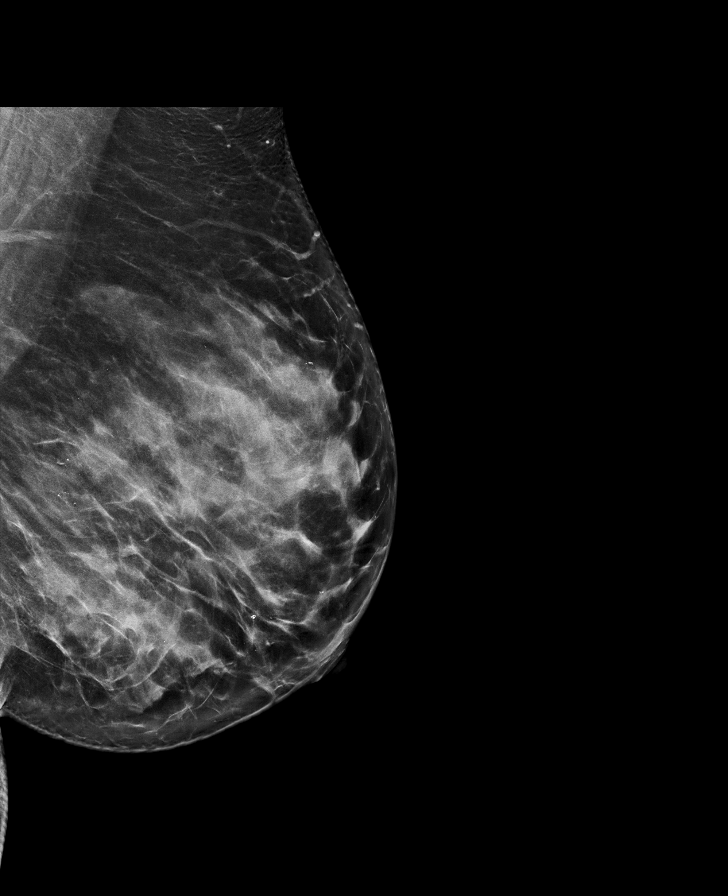

[L CC synth-2D]
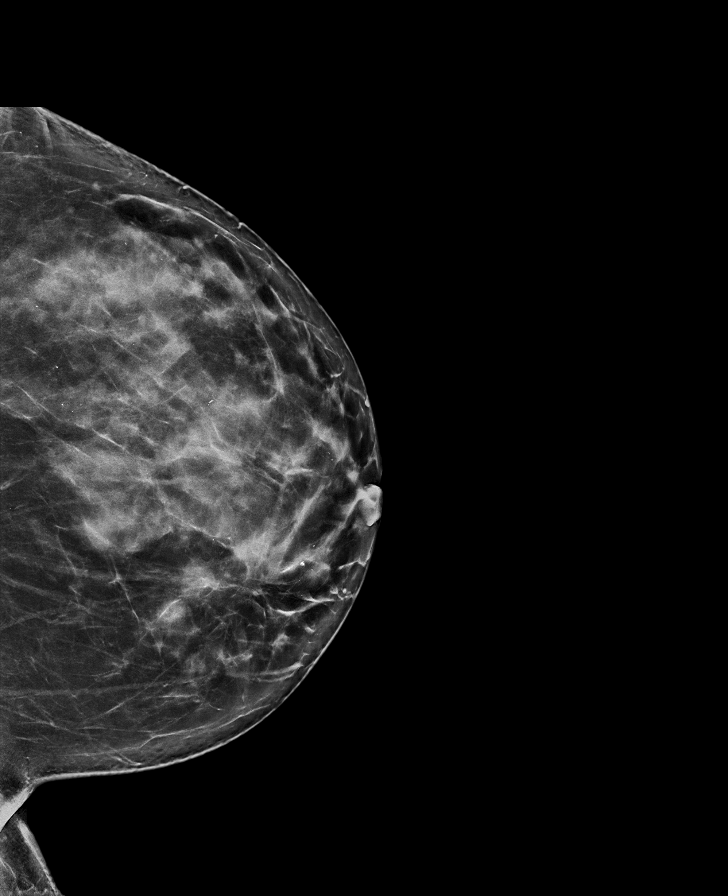

[L MLO tomo · tomo slice 47/94.0]
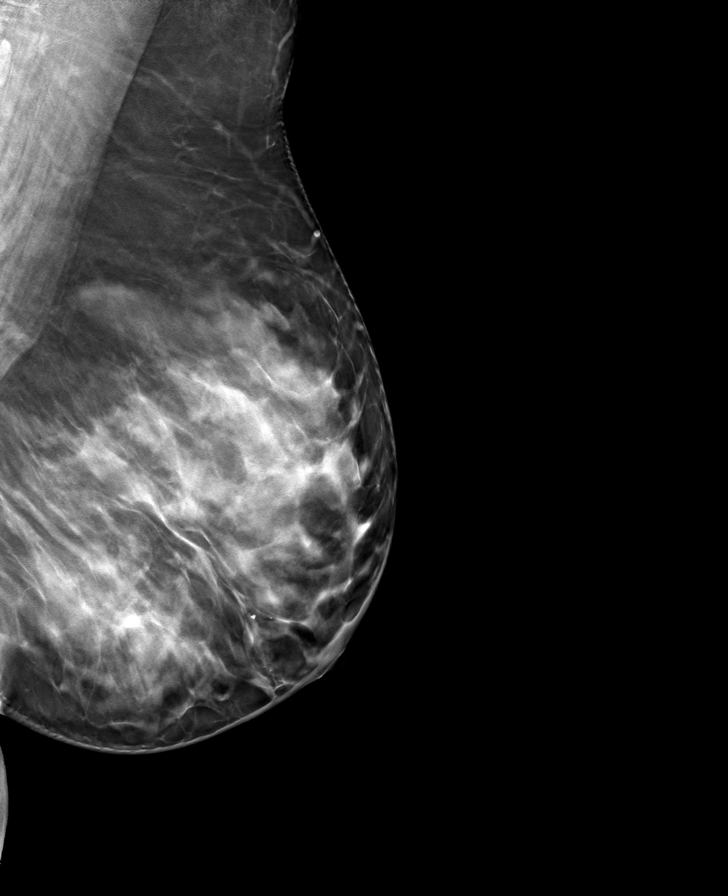

[L CC tomo · tomo slice 45/89.0]
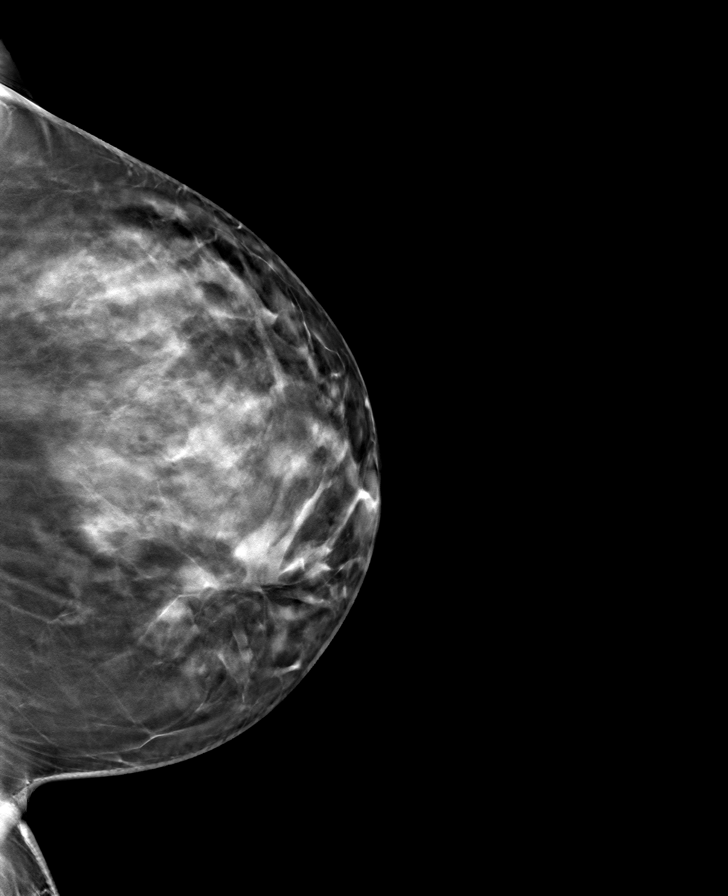

[8 of 16 positions shown; findings below may reference images not displayed]

ACR Breast Density Category c: The breast tissue is heterogeneously
dense, which may obscure small masses.
FINDINGS: The 2 small groups of calcifications noted in the left breast are
stable.

There are no masses, areas of nonsurgical architectural distortion
or new calcifications. Benign postsurgical scarring is noted in the
anterior left breast medial to the nipple from a prior benign
excisional biopsy.

Mammographic images were processed with CAD.
IMPRESSION: 1. Probably benign left breast calcifications, stable since
[DATE].

RECOMMENDATION:
Diagnostic mammography with left breast magnification views in
[DATE].

I have discussed the findings and recommendations with the patient.
Results were also provided in writing at the conclusion of the
visit. If applicable, a reminder letter will be sent to the patient
regarding the next appointment.

BI-RADS CATEGORY  3: Probably benign.

## 2019-03-02 ENCOUNTER — Encounter: Payer: Self-pay | Admitting: Family Medicine

## 2019-03-02 ENCOUNTER — Ambulatory Visit: Payer: BC Managed Care – PPO | Admitting: Family Medicine

## 2019-03-02 ENCOUNTER — Other Ambulatory Visit: Payer: Self-pay

## 2019-03-02 VITALS — BP 130/78 | Temp 97.9°F | Wt 194.8 lb

## 2019-03-02 DIAGNOSIS — M25561 Pain in right knee: Secondary | ICD-10-CM | POA: Diagnosis not present

## 2019-03-02 MED ORDER — HYDROCODONE-ACETAMINOPHEN 5-325 MG PO TABS: ORAL_TABLET | ORAL | 0 refills | Status: DC

## 2019-03-02 MED ORDER — DICLOFENAC SODIUM 75 MG PO TBEC: DELAYED_RELEASE_TABLET | ORAL | 0 refills | Status: DC

## 2019-03-02 NOTE — Progress Notes (Signed)
   Subjective:    Patient ID: Diana Tate, female    DOB: 1970-12-31, 48 y.o.   MRN: [id number]  In person Knee Pain  The incident occurred 12 to 24 hours ago. There was no injury mechanism. The pain is present in the left knee and left leg. The symptoms are aggravated by movement. Treatments tried: advil. The treatment provided mild relief.     pt states the pain is dull; kind of like a toothache. Pain in different areas.  Pain firly severe at times  Swelling and feels tight  Pain ful with extension  Feels diff area of the knee off an on   n; Worse with motion   aggrav by  Movement   advil takes four prn   painte up and down step   Ladder       Review of Systems No headache, no major weight loss or weight gain, no chest pain no back pain abdominal pain no change in bowel habits complete ROS otherwise negative     Objective:   Physical Exam Alert vitals stable, NAD. Blood pressure good on repeat. HEENT normal. Lungs clear. Heart regular rate and rhythm. Right knee mild effusion noted.  No medial joint line tenderness.  No joint laxity.  Very minimal crepitation with extension       Assessment & Plan:  Impression flare of knee pain with inflammation noted on exam.  Did use a stepladder a lot this weekend likely overuse discussed initiate Voltaren with as needed hydrocodone expect gradual resolution no x-rays rationale discussed

## 2019-03-11 ENCOUNTER — Other Ambulatory Visit: Payer: Self-pay | Admitting: Family Medicine

## 2019-03-11 ENCOUNTER — Telehealth: Payer: Self-pay | Admitting: Family Medicine

## 2019-03-11 ENCOUNTER — Encounter: Payer: Self-pay | Admitting: Family Medicine

## 2019-03-11 DIAGNOSIS — M25561 Pain in right knee: Secondary | ICD-10-CM

## 2019-03-11 MED ORDER — ETODOLAC 400 MG PO TABS: ORAL_TABLET | ORAL | 0 refills | Status: DC

## 2019-03-11 NOTE — Telephone Encounter (Signed)
Patient was seen in office on 7/15 with right knee pain was prescribe voltaren 75 mg and hydrocodone 5/325 but not helping with the pain. Please advise

## 2019-03-11 NOTE — Telephone Encounter (Signed)
Pt contacted and informed to stop Diclofenac. Sent in Lodine [facility]ID [facility] [place]. Referral marked as urgent. Pt is aware and verbalized understanding

## 2019-03-11 NOTE — Telephone Encounter (Signed)
Recommend orthopedic consultation please assist patient getting appointment her convenience with orthopedics Options include [facility]-they have a branch in [place] Dr. Aline Brochure here in town [facility] [place]  We can do a short course of steroids if the patient is interested more than likely orthopedics will do evaluation with injection of steroids into the knee

## 2019-03-11 NOTE — Telephone Encounter (Signed)
Stop diclofenac Try Lodine 400 mg 1 twice daily, #30 Please mark referral as urgent

## 2019-03-11 NOTE — Telephone Encounter (Signed)
Cant put a lot of weight on leg, is unable to sit down. Knee is swollen. Pain radiating down whole leg now.  Has on jean capris and someone told her that she could see the swelling. Sunday started off and on bothering her. On and off pain since Sunday. Last night knee begin to bother her really bad. Has been taking the medication prescribed; helped through the weekend but as stated before, pain began off and on Sunday. Please advise. Thank you

## 2019-03-11 NOTE — Telephone Encounter (Signed)
Contacted patient. Pt states she would like a referral to [facility]. Pt is not wanting to take any steroids due to weight gain; pt is wanting to know if she could use an anti inflammatory drug to help until ortho. Pt states she takes Ibuprofen sometimes but can not take much do to messing with her acid reflux. Pt states the diclofenac is not helping and Tylenol does nothing for her. Referral placed.  [facility]

## 2019-05-08 ENCOUNTER — Encounter: Payer: Self-pay | Admitting: Family Medicine

## 2019-05-30 ENCOUNTER — Other Ambulatory Visit: Payer: Self-pay | Admitting: Family Medicine

## 2019-06-07 ENCOUNTER — Ambulatory Visit
Admission: RE | Admit: 2019-06-07 | Discharge: 2019-06-07 | Disposition: A | Discharge status: Home / Self Care | Payer: BC Managed Care – PPO | Source: Ambulatory Visit | Attending: Obstetrics and Gynecology | Admitting: Obstetrics and Gynecology

## 2019-06-07 ENCOUNTER — Other Ambulatory Visit: Payer: Self-pay

## 2019-06-07 DIAGNOSIS — R921 Mammographic calcification found on diagnostic imaging of breast: Secondary | ICD-10-CM

## 2019-06-07 IMAGING — MG DIGITAL DIAGNOSTIC BILAT W/ TOMO W/ CAD
8 of 11 series · 8 of 27 positions shown · non-contrast
Comparison: Previous exam(s).

CLINICAL DATA: 48-year-old female presenting for follow-up of
probably benign left breast calcifications initially identified in
[DATE].The patient had a stereotactic biopsy in [DATE] showing fibrocystic changes in the right breast.

EXAM:
DIGITAL DIAGNOSTIC BILATERAL MAMMOGRAM WITH CAD AND TOMO

[L ML (1 of 2)]
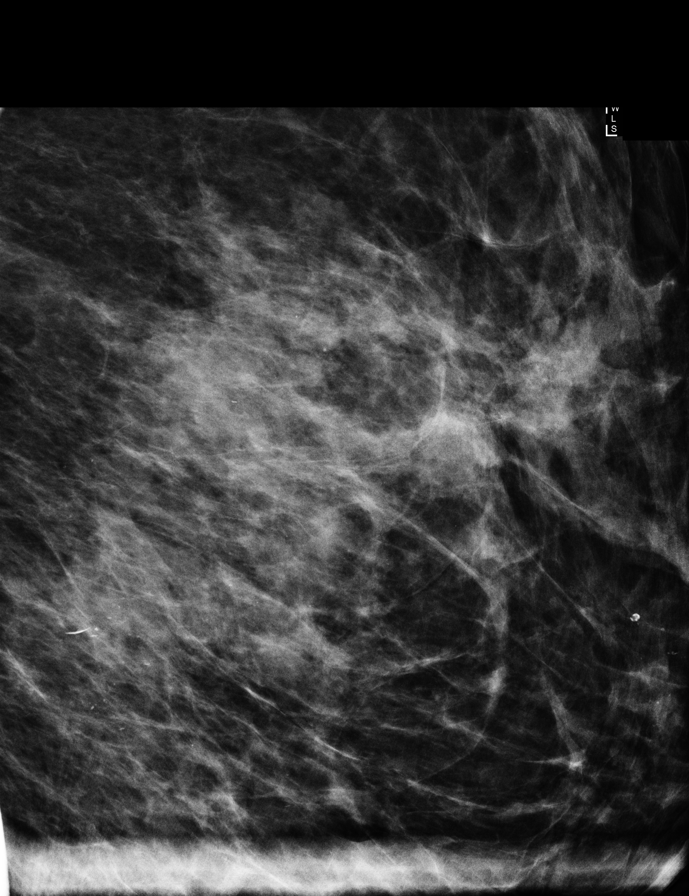

[L CC]
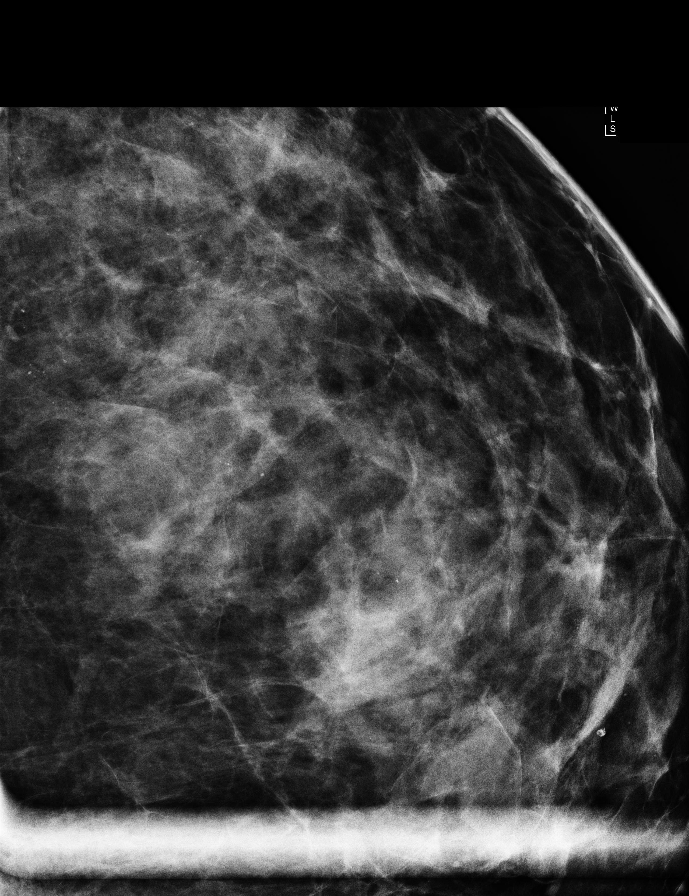

[L ML (2 of 2)]
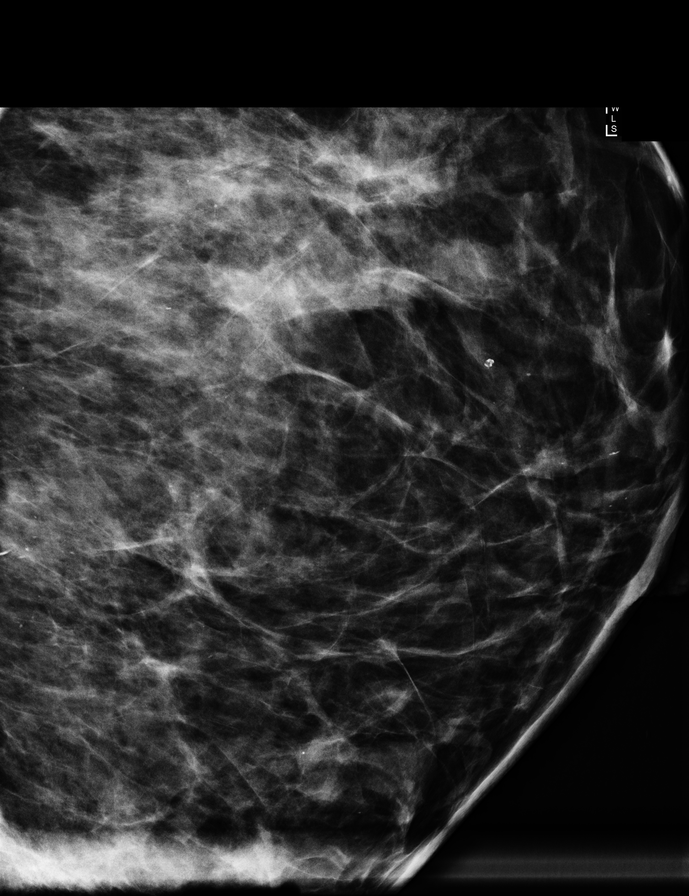

[L MLO synth-2D]
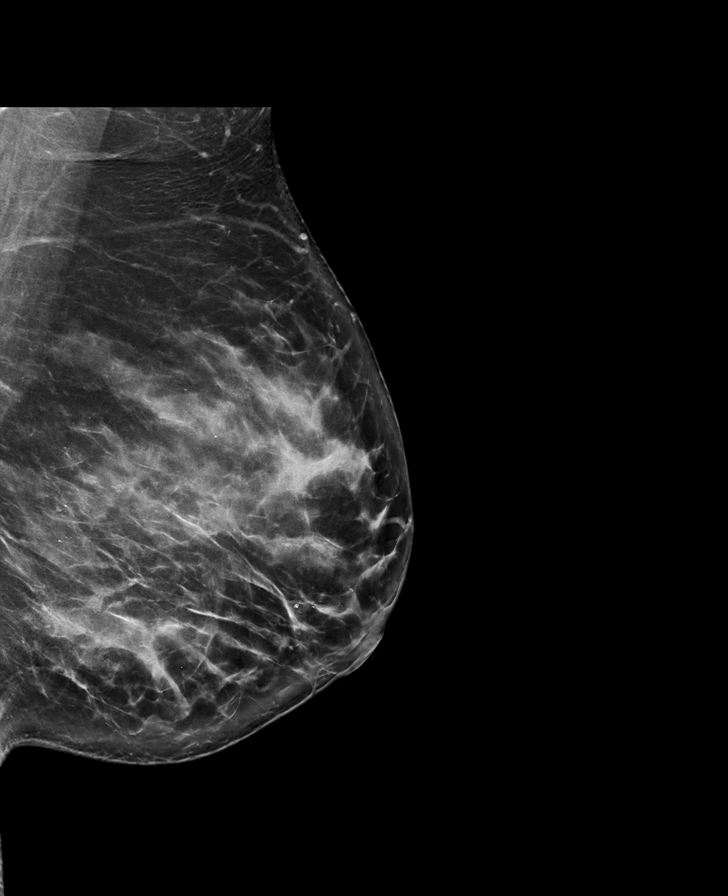

[R CC synth-2D]
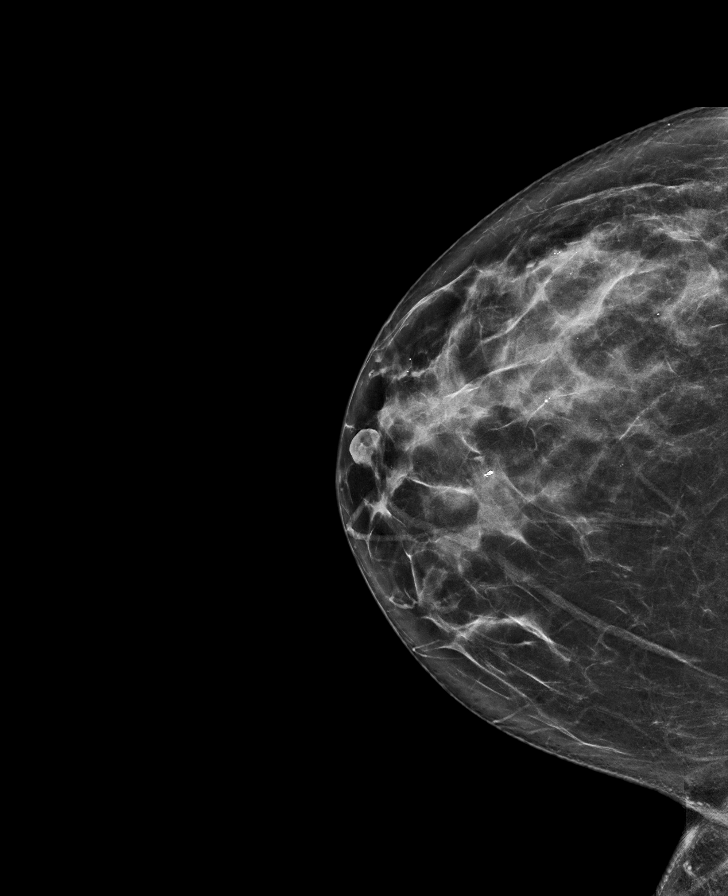

[L CC synth-2D]
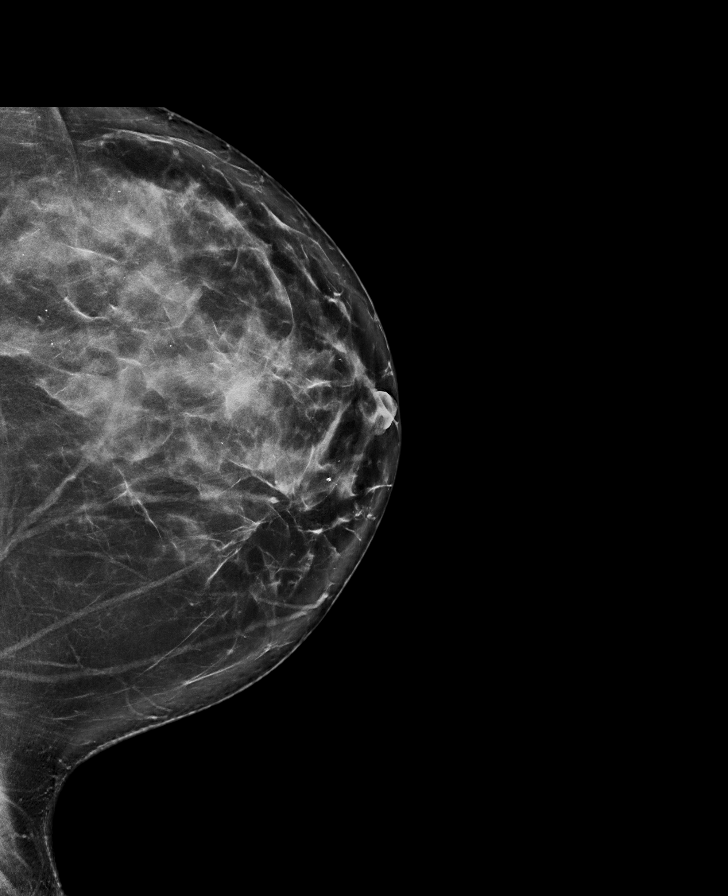

[R MLO synth-2D]
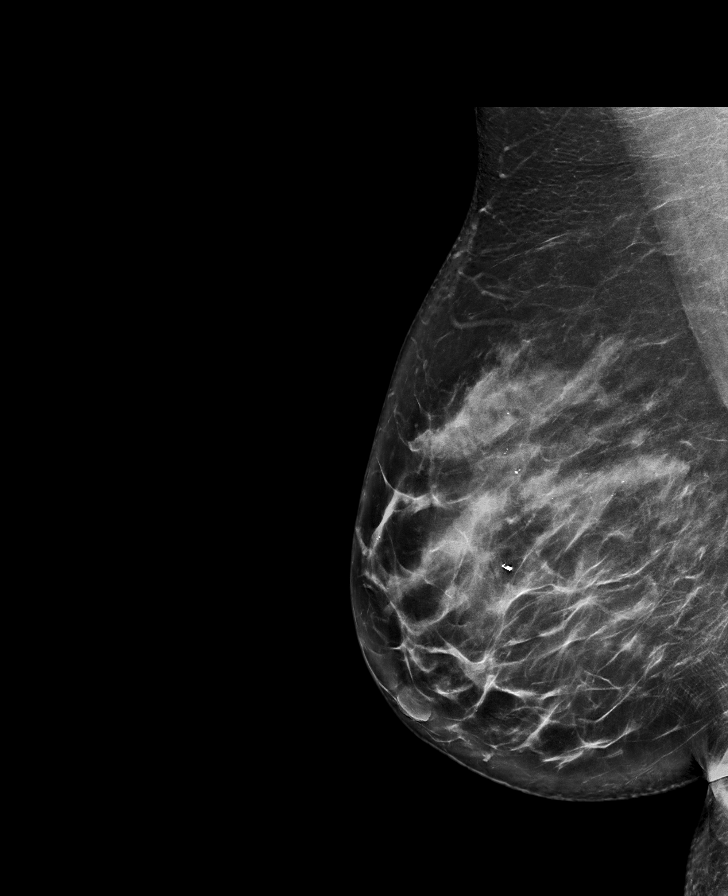

[R MLO tomo · tomo slice 48/95.0]
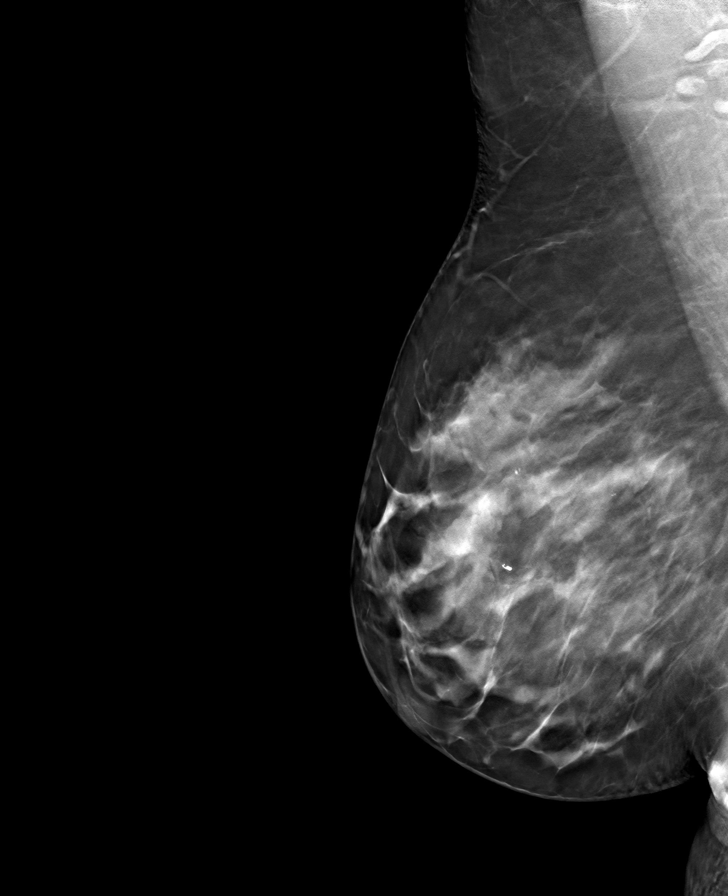

[8 of 27 positions shown; findings below may reference images not displayed]

ACR Breast Density Category c: The breast tissue is heterogeneously
dense, which may obscure small masses.
FINDINGS: No suspicious calcifications, masses or areas of distortion are seen
in the bilateral breasts. The 2 groups of calcifications in the left
breast appear mammographically stable. Both groups of calcifications
demonstrate layering on the true lateral view, consistent with
benign milk of calcium.

Mammographic images were processed with CAD.
IMPRESSION: 1. The 2 groups of left breast calcifications correspond with benign
milk calcium.

2.  No mammographic evidence of malignancy in the bilateral breasts.

RECOMMENDATION:
Screening mammogram in one year.(Code:[4T])

I have discussed the findings and recommendations with the patient.
If applicable, a reminder letter will be sent to the patient
regarding the next appointment.

BI-RADS CATEGORY  2: Benign.

## 2019-07-19 ENCOUNTER — Encounter: Payer: Self-pay | Admitting: Family Medicine

## 2019-07-19 ENCOUNTER — Other Ambulatory Visit: Payer: Self-pay

## 2019-07-19 ENCOUNTER — Ambulatory Visit (INDEPENDENT_AMBULATORY_CARE_PROVIDER_SITE_OTHER): Payer: BC Managed Care – PPO | Admitting: Family Medicine

## 2019-07-19 DIAGNOSIS — R21 Rash and other nonspecific skin eruption: Secondary | ICD-10-CM | POA: Diagnosis not present

## 2019-07-19 MED ORDER — DOXYCYCLINE HYCLATE 100 MG PO TABS: 100.0000 mg | ORAL_TABLET | Freq: Two times a day (BID) | ORAL | 0 refills | Status: DC

## 2019-07-19 NOTE — Progress Notes (Signed)
   Subjective:  Audio  Patient ID: Cardell Rotolo, female    DOB: 05-Jan-1971, 48 y.o.   MRN: [id number]  HPI  Patient calls with a tick bite to the back of her right thigh. Patient noticed the tick bite and removed the tick on Saturday and stated the area has a small red circle around it.  Virtual Visit via Video Note  I connected with Yoshiko Zuno on 07/19/19 at  2:00 PM EST by a video enabled telemedicine application and verified that I am speaking with the correct person using two identifiers.  Location: Patient: home Provider: office   I discussed the limitations of evaluation and management by telemedicine and the availability of in person appointments. The patient expressed understanding and agreed to proceed.  History of Present Illness:    Observations/Objective:   Assessment and Plan:   Follow Up Instructions:    I discussed the assessment and treatment plan with the patient. The patient was provided an opportunity to ask questions and all were answered. The patient agreed with the plan and demonstrated an understanding of the instructions.   The patient was advised to call back or seek an in-person evaluation if the symptoms worsen or if the condition fails to improve as anticipated.  I provided 17 minutes of non-face-to-face time during this encounter.  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  On Saturday.  On for approximately 6 hours  No fever no headache no achiness no rash elsewhere  Is developed a red patch.  Central clearing.  This concerns    Review of Systems See above    Objective:   Physical Exam  Virtual      Assessment & Plan:  Impression probable rash secondary to inflammation from tick bite and not true tickborne illness.  Discussed with however small chance of more serious disease will cover with Doxy 100 twice daily  7 days.  Symptom care discussed

## 2019-07-28 ENCOUNTER — Institutional Professional Consult (permissible substitution): Payer: BC Managed Care – PPO | Admitting: Internal Medicine

## 2019-07-28 ENCOUNTER — Other Ambulatory Visit: Payer: Self-pay

## 2019-07-28 DIAGNOSIS — Z20822 Contact with and (suspected) exposure to covid-19: Secondary | ICD-10-CM

## 2019-07-29 LAB — NOVEL CORONAVIRUS, NAA: SARS-CoV-2, NAA: NOT DETECTED

## 2019-08-05 ENCOUNTER — Telehealth: Payer: Self-pay | Admitting: Family Medicine

## 2019-08-05 NOTE — Telephone Encounter (Signed)
Pt contacted and verbalized understanding.  

## 2019-08-05 NOTE — Telephone Encounter (Signed)
Pt contacted. Pt states her coworker is in training and sits right next to her. Pt is not showing any symptoms. Pt is wanting to know how long the timeframe is for showing symptoms. When should she get tested? Please advise. Thank you

## 2019-08-05 NOTE — Telephone Encounter (Signed)
Pt's co-worker was sent home sick Wednesday & has tested positive for Covid  Pt's employer sent her home & told her to quarantine  Pt wonders how long should she quarantine, if & when she should be tested  Please advise & call pt

## 2019-08-05 NOTE — Telephone Encounter (Signed)
Left message to return call 

## 2019-08-05 NOTE — Telephone Encounter (Signed)
Latest cdc rec is ten days without testing, or 7 days with a neg test , with the test on the 7th day. She could go get a rapid test next wed, if neg, could immed return to work then.

## 2019-08-10 ENCOUNTER — Encounter: Payer: Self-pay | Admitting: Pulmonary Disease

## 2019-08-10 ENCOUNTER — Ambulatory Visit (INDEPENDENT_AMBULATORY_CARE_PROVIDER_SITE_OTHER): Payer: BC Managed Care – PPO | Admitting: Pulmonary Disease

## 2019-08-10 ENCOUNTER — Other Ambulatory Visit: Payer: Self-pay

## 2019-08-10 VITALS — BP 114/74 | HR 94 | Temp 97.8°F | Ht 66.0 in | Wt 201.2 lb

## 2019-08-10 DIAGNOSIS — G4733 Obstructive sleep apnea (adult) (pediatric): Secondary | ICD-10-CM

## 2019-08-10 NOTE — Progress Notes (Signed)
Subjective:    Patient ID: Diana Tate, female    DOB: 1970-11-09, 48 y.o.   MRN: [id number]  Patient with a past history of obstructive sleep apnea Did find it difficult to tolerate CPAP  Continued CPAP for about 3 to 4 years It took up to a year to year and half to get used to using CPAP on a regular basis Finding the right mask was difficult, getting used to wearing the mask was difficult  She managed to lose a lot of weight and stopped using CPAP  She has since remarried, has gained about 40 pounds back Changed jobs that made her more sedentary Has been told she snores No witnessed apneas  No dryness of her mouth in the mornings, no headaches  Ability to concentrate and memory is becoming more challenging  She has had conversations with her dentist about an oral device  Past Medical History:  Diagnosis Date  . GERD (gastroesophageal reflux disease)   . Hyperlipidemia   . Migraine    Classic   Social History   Socioeconomic History  . Marital status: Married    Spouse name: Not on file  . Number of children: 2  . Years of education: Not on file  . Highest education level: Not on file  Occupational History  . Occupation: Retail banker: [employer]  Tobacco Use  . Smoking status: Former Smoker    Packs/day: 1.00    Years: 24.00    Pack years: 24.00    Types: Cigarettes  . Smokeless tobacco: Never Used  . Tobacco comment: Quit x 2 years  Substance and Sexual Activity  . Alcohol use: No    Alcohol/week: 0.0 standard drinks    Comment: occ  . Drug use: No  . Sexual activity: Yes  Other Topics Concern  . Not on file  Social History Narrative  . Not on file   Social Determinants of Health   Financial Resource Strain:   . Difficulty of Paying Living Expenses: Not on file  Food Insecurity:   . Worried About Charity fundraiser in the Last Year: Not on file  . Ran Out of Food in the Last Year: Not on file    Transportation Needs:   . Lack of Transportation (Medical): Not on file  . Lack of Transportation (Non-Medical): Not on file  Physical Activity:   . Days of Exercise per Week: Not on file  . Minutes of Exercise per Session: Not on file  Stress:   . Feeling of Stress : Not on file  Social Connections:   . Frequency of Communication with Friends and Family: Not on file  . Frequency of Social Gatherings with Friends and Family: Not on file  . Attends Religious Services: Not on file  . Active Member of Clubs or Organizations: Not on file  . Attends Archivist Meetings: Not on file  . Marital Status: Not on file  Intimate Partner Violence:   . Fear of Current or Ex-Partner: Not on file  . Emotionally Abused: Not on file  . Physically Abused: Not on file  . Sexually Abused: Not on file   Family History  Problem Relation Age of Onset  . Hypertension Mother   . Cancer Maternal Uncle        lung  . Cancer Maternal Grandfather        skin  . Diabetes Other  second degree relative  . Colon cancer Neg Hx   . Esophageal cancer Neg Hx    Review of Systems  Constitutional: Negative for fever and unexpected weight change.  HENT: Negative for congestion, dental problem, ear pain, nosebleeds, postnasal drip, rhinorrhea, sinus pressure, sneezing, sore throat and trouble swallowing.   Eyes: Negative for redness and itching.  Respiratory: Negative for cough, chest tightness, shortness of breath and wheezing.   Cardiovascular: Negative for palpitations and leg swelling.  Gastrointestinal: Negative for nausea and vomiting.  Genitourinary: Negative for dysuria.  Musculoskeletal: Negative for joint swelling.  Skin: Negative for rash.  Allergic/Immunologic: Negative.  Negative for environmental allergies, food allergies and immunocompromised state.  Neurological: Negative for headaches.  Hematological: Does not bruise/bleed easily.  Psychiatric/Behavioral: Negative for dysphoric  mood. The patient is not nervous/anxious.       Objective:   Physical Exam Vitals reviewed.  Constitutional:      Appearance: She is obese.  HENT:     Head: Normocephalic and atraumatic.     Nose: Nose normal. No congestion or rhinorrhea.     Mouth/Throat:     Mouth: Mucous membranes are moist.  Eyes:     Pupils: Pupils are equal, round, and reactive to light.  Cardiovascular:     Rate and Rhythm: Normal rate and regular rhythm.     Pulses: Normal pulses.     Heart sounds: Normal heart sounds. No murmur. No friction rub.  Pulmonary:     Effort: Pulmonary effort is normal. No respiratory distress.     Breath sounds: Normal breath sounds. No stridor. No wheezing or rhonchi.  Musculoskeletal:     Cervical back: Normal range of motion and neck supple. No rigidity or tenderness.  Skin:    General: Skin is warm.  Neurological:     General: No focal deficit present.     Mental Status: She is alert and oriented to person, place, and time.  Psychiatric:        Mood and Affect: Mood normal.        Behavior: Behavior normal.    Vitals:   08/10/19 1522  BP: 114/74  Pulse: 94  Temp: 97.8 F (36.6 C)  SpO2: 97%   Results of the Epworth flowsheet 08/10/2019  Sitting and reading 1  Watching TV 3  Sitting, inactive in a public place (e.g. a theatre or a meeting) 1  As a passenger in a car for an hour without a break 1  Lying down to rest in the afternoon when circumstances permit 3  Sitting and talking to someone 1  Sitting quietly after a lunch without alcohol 1  In a car, while stopped for a few minutes in traffic 0  Total score 11      Assessment & Plan:  .  High probability of significant obstructive sleep apnea  .  Daytime sleepiness -This is likely related to untreated obstructive sleep apnea  .  Obesity -Weight gain recently -Likely contributing to severity of obstructive sleep apnea and recurrence of symptoms  Pathophysiology of sleep disordered breathing  discussed Treatment options discussed  .  Plan: We will schedule the patient for a home sleep study  .  Weight loss efforts encouraged  .  We will follow-up in 2 to 3 months  .  Options of treatment will include CPAP therapy versus an oral device .  Did discuss possibility of using a mild sedative like Lunesta to help promote tolerance to CPAP

## 2019-08-10 NOTE — Patient Instructions (Signed)
History of obstructive sleep apnea Recurrence of symptoms with weight gain  We will schedule you for a home sleep study We will inform you as soon as results are available  Treatment options as discussed  We will follow-up with you in about 2 to 3 months, at which time it is to evaluate how treatment is going  Call with significant concerns Sleep Apnea Sleep apnea is a condition in which breathing pauses or becomes shallow during sleep. Episodes of sleep apnea usually last 10 seconds or longer, and they may occur as many as 20 times an hour. Sleep apnea disrupts your sleep and keeps your body from getting the rest that it needs. This condition can increase your risk of certain health problems, including:  Heart attack.  Stroke.  Obesity.  Diabetes.  Heart failure.  Irregular heartbeat. What are the causes? There are three kinds of sleep apnea:  Obstructive sleep apnea. This kind is caused by a blocked or collapsed airway.  Central sleep apnea. This kind happens when the part of the brain that controls breathing does not send the correct signals to the muscles that control breathing.  Mixed sleep apnea. This is a combination of obstructive and central sleep apnea. The most common cause of this condition is a collapsed or blocked airway. An airway can collapse or become blocked if:  Your throat muscles are abnormally relaxed.  Your tongue and tonsils are larger than normal.  You are overweight.  Your airway is smaller than normal. What increases the risk? You are more likely to develop this condition if you:  Are overweight.  Smoke.  Have a smaller than normal airway.  Are elderly.  Are female.  Drink alcohol.  Take sedatives or tranquilizers.  Have a family history of sleep apnea. What are the signs or symptoms? Symptoms of this condition include:  Trouble staying asleep.  Daytime sleepiness and tiredness.  Irritability.  Loud snoring.  Morning  headaches.  Trouble concentrating.  Forgetfulness.  Decreased interest in sex.  Unexplained sleepiness.  Mood swings.  Personality changes.  Feelings of depression.  Waking up often during the night to urinate.  Dry mouth.  Sore throat. How is this diagnosed? This condition may be diagnosed with:  A medical history.  A physical exam.  A series of tests that are done while you are sleeping (sleep study). These tests are usually done in a sleep lab, but they may also be done at home. How is this treated? Treatment for this condition aims to restore normal breathing and to ease symptoms during sleep. It may involve managing health issues that can affect breathing, such as high blood pressure or obesity. Treatment may include:  Sleeping on your side.  Using a decongestant if you have nasal congestion.  Avoiding the use of depressants, including alcohol, sedatives, and narcotics.  Losing weight if you are overweight.  Making changes to your diet.  Quitting smoking.  Using a device to open your airway while you sleep, such as: ? An oral appliance. This is a custom-made mouthpiece that shifts your lower jaw forward. ? A continuous positive airway pressure (CPAP) device. This device blows air through a mask when you breathe out (exhale). ? A nasal expiratory positive airway pressure (EPAP) device. This device has valves that you put into each nostril. ? A bi-level positive airway pressure (BPAP) device. This device blows air through a mask when you breathe in (inhale) and breathe out (exhale).  Having surgery if other treatments do  not work. During surgery, excess tissue is removed to create a wider airway. It is important to get treatment for sleep apnea. Without treatment, this condition can lead to:  High blood pressure.  Coronary artery disease.  In men, an inability to achieve or maintain an erection (impotence).  Reduced thinking abilities. Follow these  instructions at home: Lifestyle  Make any lifestyle changes that your health care provider recommends.  Eat a healthy, well-balanced diet.  Take steps to lose weight if you are overweight.  Avoid using depressants, including alcohol, sedatives, and narcotics.  Do not use any products that contain nicotine or tobacco, such as cigarettes, e-cigarettes, and chewing tobacco. If you need help quitting, ask your health care provider. General instructions  Take over-the-counter and prescription medicines only as told by your health care provider.  If you were given a device to open your airway while you sleep, use it only as told by your health care provider.  If you are having surgery, make sure to tell your health care provider you have sleep apnea. You may need to bring your device with you.  Keep all follow-up visits as told by your health care provider. This is important. Contact a health care provider if:  The device that you received to open your airway during sleep is uncomfortable or does not seem to be working.  Your symptoms do not improve.  Your symptoms get worse. Get help right away if:  You develop: ? Chest pain. ? Shortness of breath. ? Discomfort in your back, arms, or stomach.  You have: ? Trouble speaking. ? Weakness on one side of your body. ? Drooping in your face. These symptoms may represent a serious problem that is an emergency. Do not wait to see if the symptoms will go away. Get medical help right away. Call your local emergency services (911 in the [place] Do not drive yourself to the hospital. Summary  Sleep apnea is a condition in which breathing pauses or becomes shallow during sleep.  The most common cause is a collapsed or blocked airway.  The goal of treatment is to restore normal breathing and to ease symptoms during sleep. This information is not intended to replace advice given to you by your health care provider. Make sure you discuss any  questions you have with your health care provider. Document Released: 07/25/2002 Document Revised: 05/21/2018 Document Reviewed: 03/30/2018 Elsevier Patient Education  2020 [employer].

## 2019-09-02 ENCOUNTER — Telehealth: Payer: Self-pay | Admitting: Pulmonary Disease

## 2019-09-02 NOTE — Telephone Encounter (Signed)
I have spoken with Mrs. Daquila and she has been scheduled to pick up HST machine on 09/08/19 @ 4:30pm

## 2019-09-08 ENCOUNTER — Other Ambulatory Visit: Payer: Self-pay

## 2019-09-08 ENCOUNTER — Ambulatory Visit (INDEPENDENT_AMBULATORY_CARE_PROVIDER_SITE_OTHER): Payer: BC Managed Care – PPO

## 2019-09-08 DIAGNOSIS — G4733 Obstructive sleep apnea (adult) (pediatric): Secondary | ICD-10-CM

## 2019-09-13 DIAGNOSIS — G4733 Obstructive sleep apnea (adult) (pediatric): Secondary | ICD-10-CM | POA: Diagnosis not present

## 2019-09-15 ENCOUNTER — Telehealth: Payer: Self-pay | Admitting: Pulmonary Disease

## 2019-09-15 DIAGNOSIS — G4733 Obstructive sleep apnea (adult) (pediatric): Secondary | ICD-10-CM

## 2019-09-15 NOTE — Telephone Encounter (Signed)
Called and spoke with Patient.  Dr. Judson Roch results and recommendations given.  Understanding stated.  DME order placed.  Patient aware to schedule OV with AO or NP within 3 months of starting cpap for compliance.  Nothing further at this time.   Dr. Ander Slade has reviewed the home sleep test this showed moderate obstructive sleep apnea.   Recommendations   Treatment options are CPAP with the settings auto 5 to 15.    Weight loss measures .   Advise against driving while sleepy & against medication with sedative side effects.    Make appointment for 3 months for compliance with download with Dr. Ander Slade.

## 2019-10-31 ENCOUNTER — Ambulatory Visit (INDEPENDENT_AMBULATORY_CARE_PROVIDER_SITE_OTHER): Payer: BC Managed Care – PPO | Admitting: Family Medicine

## 2019-10-31 ENCOUNTER — Other Ambulatory Visit: Payer: Self-pay

## 2019-10-31 ENCOUNTER — Encounter: Payer: Self-pay | Admitting: Family Medicine

## 2019-10-31 ENCOUNTER — Ambulatory Visit: Payer: BC Managed Care – PPO | Attending: Internal Medicine

## 2019-10-31 DIAGNOSIS — G4733 Obstructive sleep apnea (adult) (pediatric): Secondary | ICD-10-CM | POA: Diagnosis not present

## 2019-10-31 DIAGNOSIS — Z20822 Contact with and (suspected) exposure to covid-19: Secondary | ICD-10-CM

## 2019-10-31 DIAGNOSIS — J019 Acute sinusitis, unspecified: Secondary | ICD-10-CM | POA: Diagnosis not present

## 2019-10-31 MED ORDER — AMOXICILLIN-POT CLAVULANATE 875-125 MG PO TABS: 1.0000 | ORAL_TABLET | Freq: Two times a day (BID) | ORAL | 0 refills | Status: DC

## 2019-10-31 NOTE — Progress Notes (Signed)
   Subjective:    Patient ID: Diana Tate, female    DOB: [birth year], 49 y.o.   MRN: [id number]  Cough This is a new problem. Episode onset: Friday. The cough is non-productive. Associated symptoms include rhinorrhea. Pertinent negatives include no chest pain, ear pain, fever, shortness of breath or wheezing. Associated symptoms comments: Ear pressure, eyes watering, fever this morning, runny nose, head congestion . Treatments tried: Sudafed, Robitussion  The treatment provided no relief.  Significant cough congestion drainage denies high fever chills sweats she feels like she has a sinus infection did not think Covid testing necessary.  Virtual Visit via Telephone Note  I connected with Diana Tate on 10/31/19 at 10:30 AM EDT by telephone and verified that I am speaking with the correct person using two identifiers.  Location: Patient: home Provider: office   I discussed the limitations, risks, security and privacy concerns of performing an evaluation and management service by telephone and the availability of in person appointments. I also discussed with the patient that there may be a patient responsible charge related to this service. The patient expressed understanding and agreed to proceed.   History of Present Illness:    Observations/Objective:   Assessment and Plan:   Follow Up Instructions:    I discussed the assessment and treatment plan with the patient. The patient was provided an opportunity to ask questions and all were answered. The patient agreed with the plan and demonstrated an understanding of the instructions.   The patient was advised to call back or seek an in-person evaluation if the symptoms worsen or if the condition fails to improve as anticipated.  I provided 17 minutes of non-face-to-face time during this encounter.      Review of Systems  Constitutional: Negative for activity change and fever.  HENT: Positive for congestion  and rhinorrhea. Negative for ear pain.   Eyes: Negative for discharge.  Respiratory: Positive for cough. Negative for shortness of breath and wheezing.   Cardiovascular: Negative for chest pain.       Objective:   Physical Exam  Today's visit was via telephone Physical exam was not possible for this visit       Assessment & Plan:  Acute rhinosinusitis Antibiotic prescribed warning signs discussed If progressive troubles or worse to follow-up I do recommend Covid testing because she works around others she will get this done on her own Work excuse for Monday Tuesday Wednesday If Covid test positive would recommend extending next 10 days call us back if any problems

## 2019-11-01 ENCOUNTER — Telehealth: Payer: Self-pay | Admitting: Family Medicine

## 2019-11-01 ENCOUNTER — Encounter: Payer: Self-pay | Admitting: *Deleted

## 2019-11-01 LAB — NOVEL CORONAVIRUS, NAA: SARS-CoV-2, NAA: DETECTED — AB

## 2019-11-01 NOTE — Telephone Encounter (Signed)
So it is possible to start feeling better relatively quickly with an antibiotic with a sinus infection  But it is also possible for a relatively younger individual such as herself to start feeling better relatively quickly even with Covid.  The type of test (essentially a DNA PCR test )that was conducted for Covid can have false negatives but the likelihood of being a false positive is very low.  So therefore with this positive test I would recommend being 10 days away from work.  Certainly if she needs a work note for this span it would be fine to give it to her.  We do recommend putting within the work note that patient tested positive for Covid and typically that will help there work absence be covered by most workplaces  (I understand what the patient is stating that she is thinking it is a false positive but that is really rare with this type of test but a false negative happens 20% of the time-meaning that test says it is negative when in fact it was positive)(I would not recommend repeating the test, our advice would still be the same to stay out of work for 10 days)

## 2019-11-01 NOTE — Telephone Encounter (Signed)
Pt wants to know her covid results says it is confusing on mychart

## 2019-11-01 NOTE — Telephone Encounter (Signed)
Discussed with pt. Pt verbalized understanding.  °

## 2019-11-01 NOTE — Telephone Encounter (Signed)
Discussed with pt and pt verbalized understanding.  °

## 2019-11-01 NOTE — Telephone Encounter (Signed)
Called pt and let her know test was positive. Pt states she is feeling much better and thinks it is from taking the antibiotic. No sob, no fever, no chest pain. Her symptoms are head congestion. Pt thinks test was a false positive.

## 2019-11-01 NOTE — Telephone Encounter (Signed)
Also if the patient should start experiencing a lot of chest congestion difficulty breathing feeling short of breath those are urgent or concerning signs for the possibility of having to go to the urgent care or ER or call us back

## 2019-11-20 NOTE — Progress Notes (Signed)
Virtual Visit via Telephone Note  I connected with Diana Tate on 11/21/19 at 10:00 AM EDT by telephone and verified that I am speaking with the correct person using two identifiers.  Location: Patient: Home Provider: Office [facility] - [id number], [place], [place], [place] [address]   I discussed the limitations, risks, security and privacy concerns of performing an evaluation and management service by telephone and the availability of in person appointments. I also discussed with the patient that there may be a patient responsible charge related to this service. The patient expressed understanding and agreed to proceed.  Patient consented to consult via telephone: Yes People present and their role in pt care: Pt     History of Present Illness:  49 year old female former smoker followed in our office for moderate obstructive sleep apnea  Past medical history: Migraine, GERD, COVID-19 (March/2021) Smoking history: Former smoker.  Quit 2018.  24-pack-year smoking history. Maintenance: None Patient of Dr. Ander Slade  Chief complaint: CPAP follow-up  49 year old female former smoker followed in our office for moderate obstructive sleep apnea.  Patient completing a televisit today to review CPAP compliance.  CPAP compliance report listed below:  10/18/2019-11/16/2019-CPAP compliance-24 at the last 30 days use, 18 of those days greater than 4 hours, average usage 5 hours and 42 minutes, APAP setting 5-15, AHI 4, 95th percentile 14.1  Patient reports that she continues to use her CPAP therapy.  Unfortunately she contracted Covid 3 weeks ago which has been affecting her compliance.  She reports that some days she does not use it simply because she is so congested.  She is concerned because she woke up a few nights ago and saw that her AHI was 12.  We will review this today.  She recently switch mask and she feels that her leaks have been more controlled since then.  Patient  has not yet started to feel any sort of clinical improvement since starting CPAP therapy.  Unfortunately this will likely be complicated given her recent Covid diagnosis.   Patient did not require hospitalization or received monoclonal antibody infusion for her Covid diagnosis.  Observations/Objective:  09/09/2019-home sleep study-AHI 23.8, SaO2 low 71%  Social History   Tobacco Use  Smoking Status Former Smoker  . Packs/day: 1.00  . Years: 24.00  . Pack years: 24.00  . Types: Cigarettes  Smokeless Tobacco Never Used  Tobacco Comment   Quit x 2 years   Immunization History  Administered Date(s) Administered  . PPD Test 02/14/2014    Assessment and Plan:  History of COVID-19 Plan:  Continue to clinically monitor    Obstructive sleep apnea Adequate compliance on CPAP therapy today AHI controlled at 4 No significant leaks  Plan: Patient to increase CPAP compliance to daily use, goal should be over 4 hours a night Follow-up with our office in 3 to 6 months Continue CPAP therapy   Follow Up Instructions:  Return in about 6 months (around 05/22/2020), or if symptoms worsen or fail to improve, for Follow up with Dr. Ander Slade.   I discussed the assessment and treatment plan with the patient. The patient was provided an opportunity to ask questions and all were answered. The patient agreed with the plan and demonstrated an understanding of the instructions.   The patient was advised to call back or seek an in-person evaluation if the symptoms worsen or if the condition fails to improve as anticipated.  I provided 24 minutes of non-face-to-face time during this encounter.  Lauraine Rinne, NP

## 2019-11-21 ENCOUNTER — Encounter: Payer: Self-pay | Admitting: Pulmonary Disease

## 2019-11-21 ENCOUNTER — Ambulatory Visit (INDEPENDENT_AMBULATORY_CARE_PROVIDER_SITE_OTHER): Payer: BC Managed Care – PPO | Admitting: Pulmonary Disease

## 2019-11-21 ENCOUNTER — Other Ambulatory Visit: Payer: Self-pay

## 2019-11-21 DIAGNOSIS — Z8616 Personal history of COVID-19: Secondary | ICD-10-CM | POA: Diagnosis not present

## 2019-11-21 DIAGNOSIS — G4733 Obstructive sleep apnea (adult) (pediatric): Secondary | ICD-10-CM

## 2019-11-21 NOTE — Assessment & Plan Note (Signed)
Adequate compliance on CPAP therapy today AHI controlled at 4 No significant leaks  Plan: Patient to increase CPAP compliance to daily use, goal should be over 4 hours a night Follow-up with our office in 3 to 6 months Continue CPAP therapy

## 2019-11-21 NOTE — Patient Instructions (Signed)
You were seen today by Lauraine Rinne, NP  for:   It was nice talking with you today over the phone.  Continue to use your CPAP.  Try to increase her use to using it every night greater than 4 hours.  Please call us with any questions or concerns.  We will see you back in 3 to 6 months.  Take care and stay safe,  Pj Zehner  1. Obstructive sleep apnea  We recommend that you continue using your CPAP daily >>>Keep up the hard work using your device >>> Goal should be wearing this for the entire night that you are sleeping, at least 4 to 6 hours  Remember:  . Do not drive or operate heavy machinery if tired or drowsy.  . Please notify the supply company and office if you are unable to use your device regularly due to missing supplies or machine being broken.  . Work on maintaining a healthy weight and following your recommended nutrition plan  . Maintain proper daily exercise and movement  . Maintaining proper use of your device can also help improve management of other chronic illnesses such as: Blood pressure, blood sugars, and weight management.   BiPAP/ CPAP Cleaning:  >>>Clean weekly, with Dawn soap, and bottle brush.  Set up to air dry. >>> Wipe mask out daily with wet wipe or towelette   2. History of COVID-19  Continue to clinically monitor  Follow Up:    Return in about 6 months (around 05/22/2020), or if symptoms worsen or fail to improve, for Follow up with Dr. Ander Slade.   Please do your part to reduce the spread of COVID-19:      Reduce your risk of any infection  and COVID19 by using the similar precautions used for avoiding the common cold or flu:  Marland Kitchen Wash your hands often with soap and warm water for at least 20 seconds.  If soap and water are not readily available, use an alcohol-based hand sanitizer with at least 60% alcohol.  . If coughing or sneezing, cover your mouth and nose by coughing or sneezing into the elbow areas of your shirt or coat, into a tissue or into  your sleeve (not your hands). Langley Gauss A MASK when in public  . Avoid shaking hands with others and consider head nods or verbal greetings only. . Avoid touching your eyes, nose, or mouth with unwashed hands.  . Avoid close contact with people who are sick. . Avoid places or events with large numbers of people in one location, like concerts or sporting events. . If you have some symptoms but not all symptoms, continue to monitor at home and seek medical attention if your symptoms worsen. . If you are having a medical emergency, call 911.   [place] / e-Visit: [email or web address]         [facility]: [facility]: [id number]                   [facility]: [id number]     It is flu season:   >>> Best ways to protect herself from the flu: Receive the yearly flu vaccine, practice good hand hygiene washing with soap and also using hand sanitizer when available, eat a nutritious meals, get adequate rest, hydrate appropriately   Please contact the office if your symptoms worsen or you have concerns that you are not improving.   Thank  you for choosing [facility] for your healthcare, and for allowing [place] to partner with you on your healthcare journey. I am thankful to be able to provide care to you today.   Wyn Quaker FNP-C

## 2019-11-21 NOTE — Assessment & Plan Note (Signed)
Plan:  Continue to clinically monitor

## 2019-11-24 ENCOUNTER — Other Ambulatory Visit: Payer: Self-pay | Admitting: Family Medicine

## 2019-11-29 ENCOUNTER — Other Ambulatory Visit: Payer: Self-pay

## 2019-11-29 ENCOUNTER — Encounter: Payer: Self-pay | Admitting: Family Medicine

## 2019-11-29 ENCOUNTER — Ambulatory Visit (INDEPENDENT_AMBULATORY_CARE_PROVIDER_SITE_OTHER): Payer: BC Managed Care – PPO | Admitting: Family Medicine

## 2019-11-29 DIAGNOSIS — K529 Noninfective gastroenteritis and colitis, unspecified: Secondary | ICD-10-CM

## 2019-11-29 MED ORDER — ONDANSETRON 4 MG PO TBDP: ORAL_TABLET | ORAL | 0 refills | Status: DC

## 2019-11-29 NOTE — Progress Notes (Signed)
   Subjective:  Audio only  Patient ID: Diana Tate, female    DOB: Oct 13, 1970, 49 y.o.   MRN: [id number]  HPI Pt is having headache, fever, chills, body aches and diarrhea x1 this morning. Pt began to vomit on Sunday night. States she ate some leftovers and thought maybe she had came down with food poisoning. Pt got up to go to work and at about 6:20 pt began to vomit; vomited off and on until 1 a.m. Pt began to run a fever of 100.4 shortly after vomiting. Pt has taken Tylenol to help.   Virtual Visit via Telephone Note  I connected with Diana Tate on 11/29/19 at 11:00 AM EDT by telephone and verified that I am speaking with the correct person using two identifiers.  Location: Patient: home Provider: office   I discussed the limitations, risks, security and privacy concerns of performing an evaluation and management service by telephone and the availability of in person appointments. I also discussed with the patient that there may be a patient responsible charge related to this service. The patient expressed understanding and agreed to proceed.   History of Present Illness:    Observations/Objective:   Assessment and Plan:   Follow Up Instructions:    I discussed the assessment and treatment plan with the patient. The patient was provided an opportunity to ask questions and all were answered. The patient agreed with the plan and demonstrated an understanding of the instructions.   The patient was advised to call back or seek an in-person evaluation if the symptoms worsen or if the condition fails to improve as anticipated.  I provided 22 minutes of non-face-to-face time during this encounter.  Had sig headache sund  Felt better transiently  mon morn felt nauseated   Then vomited multi times  Had diarrhea times  100,4 tmax       Review of Systems See above    Objective:   Physical Exam  Virtual     Assessment & Plan:  Impression viral  gastroenteritis versus food poisoning.  Warning signs discussed.  Zofran as needed for nausea.  Dietary adjustments discussed

## 2019-12-15 ENCOUNTER — Ambulatory Visit: Payer: BC Managed Care – PPO | Admitting: Pulmonary Disease

## 2020-01-09 ENCOUNTER — Other Ambulatory Visit: Payer: Self-pay

## 2020-01-09 ENCOUNTER — Emergency Department (HOSPITAL_COMMUNITY): Payer: BC Managed Care – PPO

## 2020-01-09 ENCOUNTER — Emergency Department (HOSPITAL_COMMUNITY)
Admission: EM | Admit: 2020-01-09 | Discharge: 2020-01-09 | Disposition: A | Discharge status: Home / Self Care | Payer: BC Managed Care – PPO | Attending: Emergency Medicine | Admitting: Emergency Medicine

## 2020-01-09 ENCOUNTER — Telehealth: Payer: Self-pay | Admitting: *Deleted

## 2020-01-09 DIAGNOSIS — Z87891 Personal history of nicotine dependence: Secondary | ICD-10-CM | POA: Insufficient documentation

## 2020-01-09 DIAGNOSIS — Z79899 Other long term (current) drug therapy: Secondary | ICD-10-CM | POA: Diagnosis not present

## 2020-01-09 DIAGNOSIS — R112 Nausea with vomiting, unspecified: Secondary | ICD-10-CM | POA: Insufficient documentation

## 2020-01-09 DIAGNOSIS — R109 Unspecified abdominal pain: Secondary | ICD-10-CM | POA: Diagnosis present

## 2020-01-09 LAB — URINALYSIS, ROUTINE W REFLEX MICROSCOPIC
Bilirubin Urine: NEGATIVE
Glucose, UA: NEGATIVE mg/dL
Ketones, ur: NEGATIVE mg/dL
Nitrite: NEGATIVE
Protein, ur: NEGATIVE mg/dL
Specific Gravity, Urine: 1.011 (ref 1.005–1.030)
pH: 7 (ref 5.0–8.0)

## 2020-01-09 LAB — COMPREHENSIVE METABOLIC PANEL
ALT: 13 U/L (ref 0–44)
AST: 24 U/L (ref 15–41)
Albumin: 3.7 g/dL (ref 3.5–5.0)
Alkaline Phosphatase: 67 U/L (ref 38–126)
Anion gap: 8 (ref 5–15)
BUN: 8 mg/dL (ref 6–20)
CO2: 27 mmol/L (ref 22–32)
Calcium: 9.9 mg/dL (ref 8.9–10.3)
Chloride: 102 mmol/L (ref 98–111)
Creatinine, Ser: 0.64 mg/dL (ref 0.44–1.00)
GFR calc Af Amer: >60 mL/min (ref >60)
GFR calc non Af Amer: >60 mL/min (ref >60)
Glucose, Bld: 107 mg/dL — ABNORMAL HIGH (ref 70–99)
Potassium: 4.6 mmol/L (ref 3.5–5.1)
Sodium: 137 mmol/L (ref 135–145)
Total Bilirubin: 1.3 mg/dL — ABNORMAL HIGH (ref 0.3–1.2)
Total Protein: 6.9 g/dL (ref 6.5–8.1)

## 2020-01-09 LAB — CBC
HCT: 41.5 % (ref 36.0–46.0)
Hemoglobin: 12.8 g/dL (ref 12.0–15.0)
MCH: 27.4 pg (ref 26.0–34.0)
MCHC: 30.8 g/dL (ref 30.0–36.0)
MCV: 88.9 fL (ref 80.0–100.0)
Platelets: 245 10*3/uL (ref 150–400)
RBC: 4.67 MIL/uL (ref 3.87–5.11)
RDW: 14.3 % (ref 11.5–15.5)
WBC: 14.8 10*3/uL — ABNORMAL HIGH (ref 4.0–10.5)
nRBC: 0 % (ref 0.0–0.2)

## 2020-01-09 LAB — LIPASE, BLOOD: Lipase: 31 U/L (ref 11–51)

## 2020-01-09 LAB — I-STAT BETA HCG BLOOD, ED (MC, WL, AP ONLY): I-stat hCG, quantitative: <5 m[IU]/mL (ref <5)

## 2020-01-09 IMAGING — CT CT ABD-PELV W/ CM
2 of 5 series · 16 of 46 positions shown, 18 images · IV contrast (Omni 300)
Comparison: None.

CLINICAL DATA: Right upper quadrant pain

EXAM:
CT ABDOMEN AND PELVIS WITH CONTRAST
TECHNIQUE: Multidetector CT imaging of the abdomen and pelvis was performed
using the standard protocol following bolus administration of
intravenous contrast.
CONTRAST:  100mL OMNIPAQUE IOHEXOL 300 MG/ML  SOLN

[Series 3: a/p w/ 5mm · axial · 0.93mm/px · z∈[-496,-86]mm · 13 of 92 slices shown, 15 images]
[im 5/92  soft-tissue]
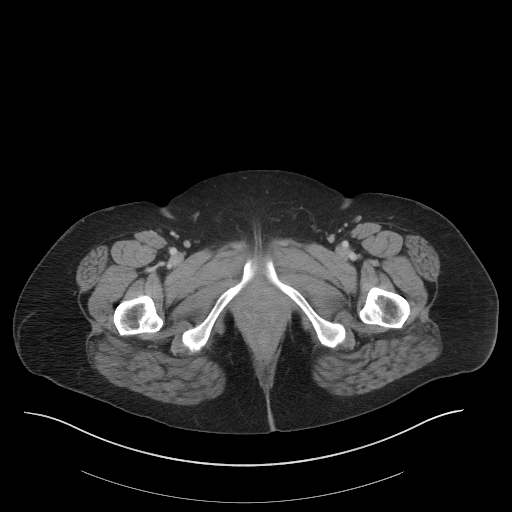
[im 5/92  bone]
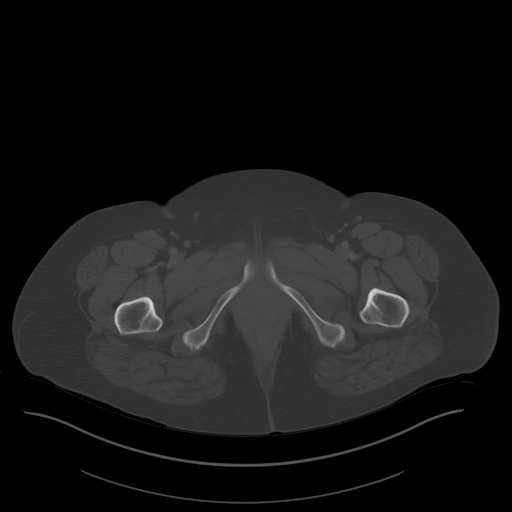
[im 15/92  soft-tissue]
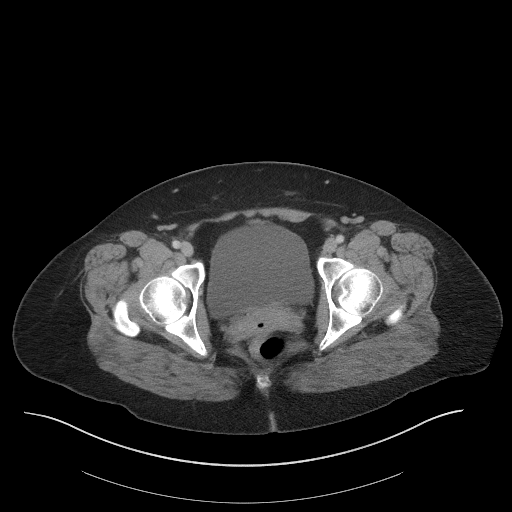
[im 20/92  soft-tissue]
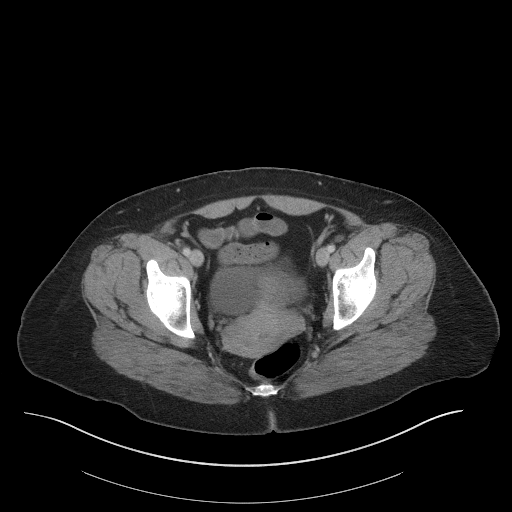
[im 24/92  soft-tissue]
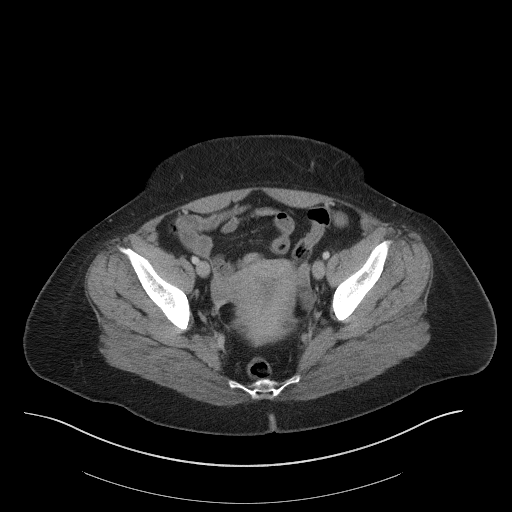
[im 34/92  soft-tissue]
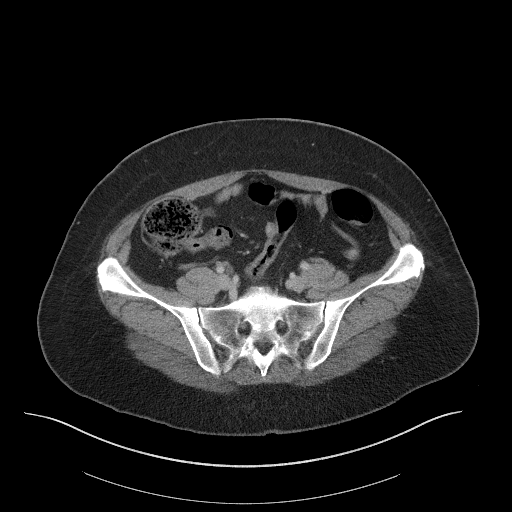
[im 39/92  soft-tissue]
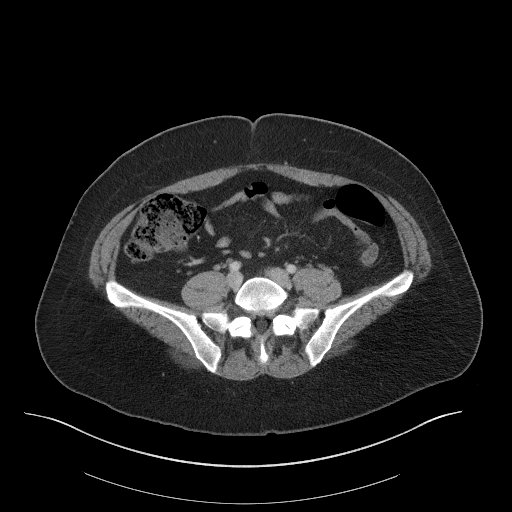
[im 48/92  soft-tissue]
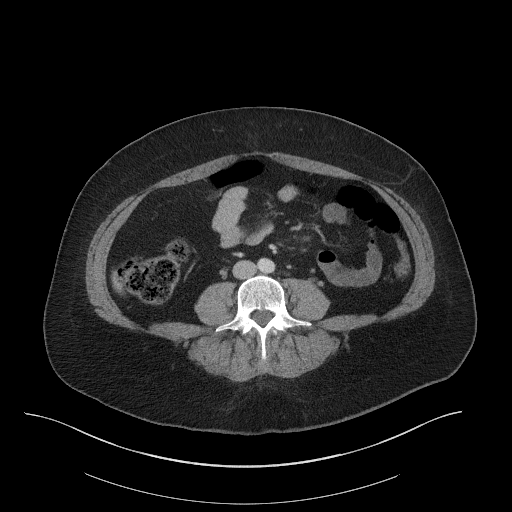
[im 53/92  soft-tissue]
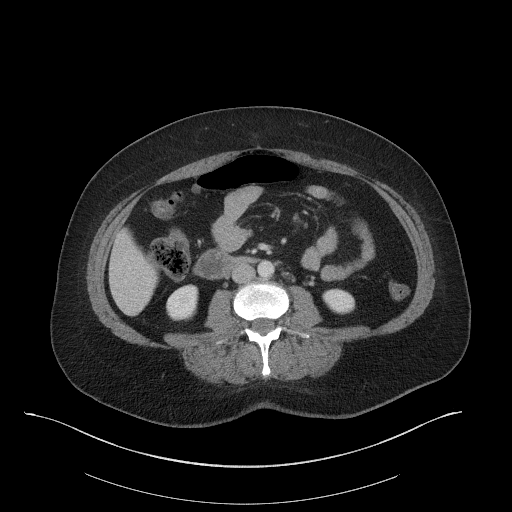
[im 58/92  soft-tissue]
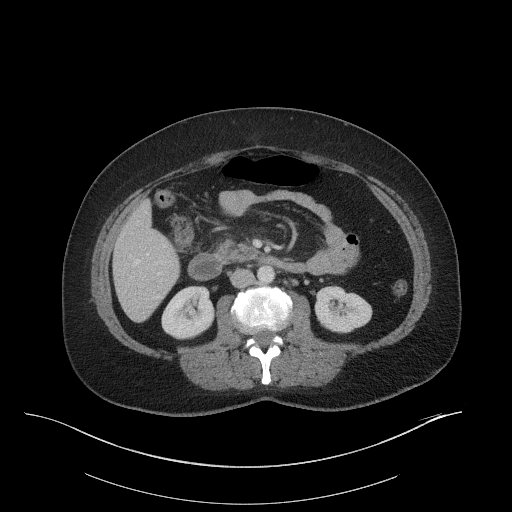
[im 58/92  bone]
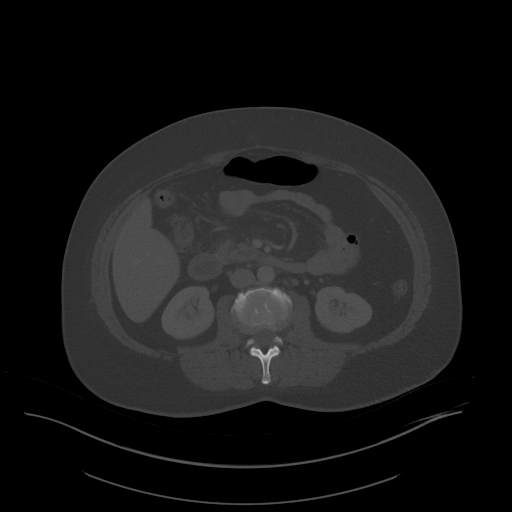
[im 68/92  soft-tissue]
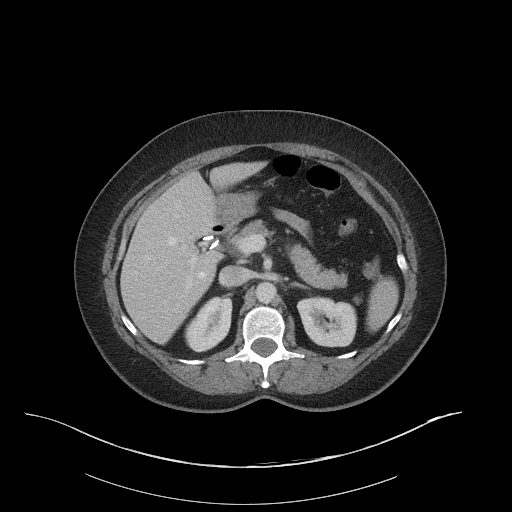
[im 72/92  soft-tissue]
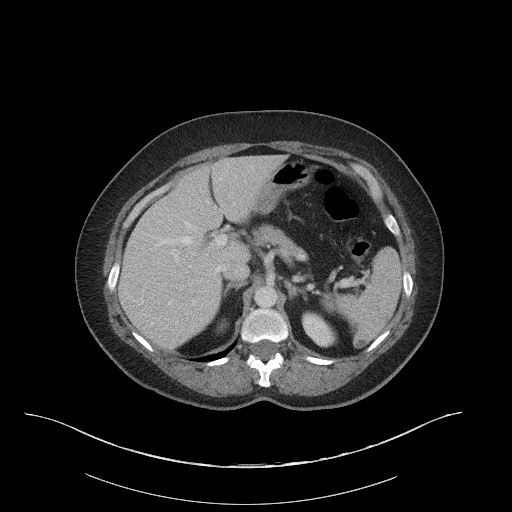
[im 77/92  soft-tissue]
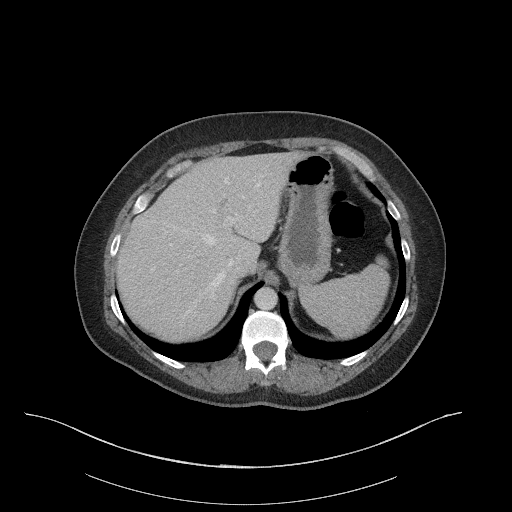
[im 87/92  soft-tissue]
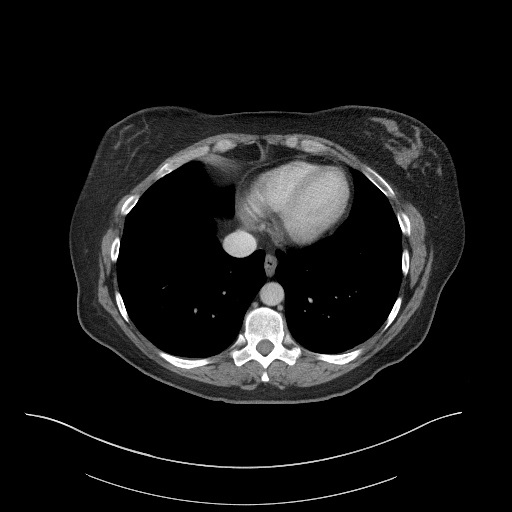

[Series 6: a/p w/ cor · coronal · 0.71mm/px · 3 of 166 slices shown]
[im 56/166  soft-tissue]
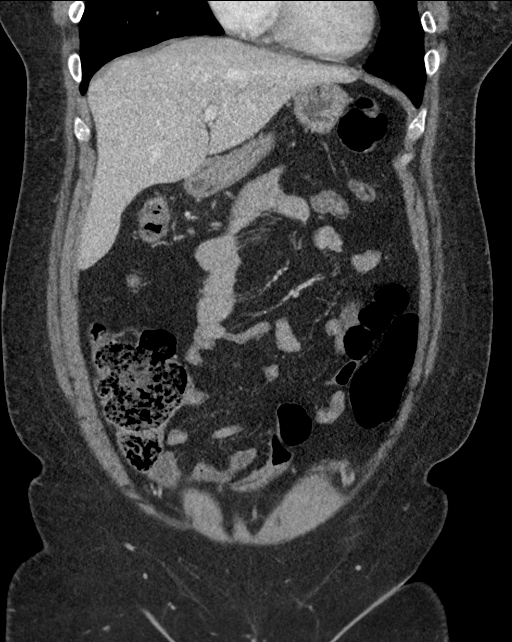
[im 74/166  soft-tissue]
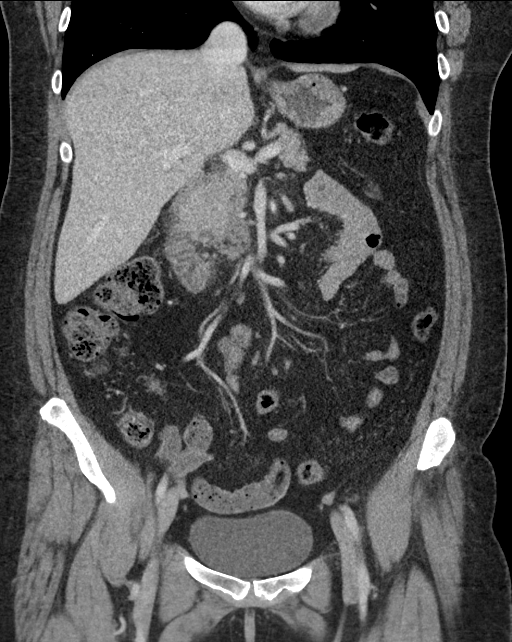
[im 92/166  soft-tissue]
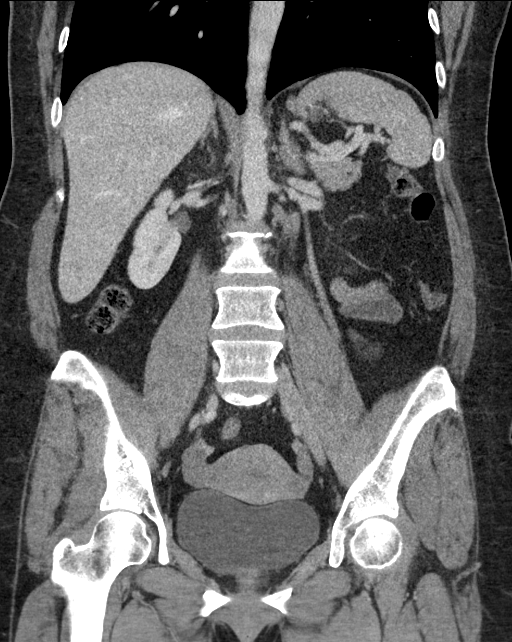

[16 of 46 positions shown; findings below may reference images not displayed]

FINDINGS: Lower chest: No acute abnormality.

Hepatobiliary: No focal liver abnormality is seen. Status post
cholecystectomy. No biliary dilatation.

Pancreas: Mild infiltration of the fat near the pancreatic head.

Spleen: Few nonspecific too small to characterize hypoattenuating
lesions.

Adrenals/Urinary Tract: Kidneys, adrenals, and bladder are
unremarkable.

Stomach/Bowel: Stomach is within normal limits. Bowel is normal in
caliber. Normal appendix.

Vascular/Lymphatic: No significant vascular findings are present. No
enlarged abdominal or pelvic lymph nodes.

Reproductive: Uterus and bilateral adnexa are unremarkable.

Other: No ascites.  Abdominal wall is unremarkable.

Musculoskeletal: No acute osseous abnormality.
IMPRESSION: Mild fat infiltration near the pancreatic head raising the
possibility of acute pancreatitis. Otherwise unremarkable.

## 2020-01-09 MED ORDER — IOHEXOL 300 MG/ML  SOLN
100.0000 mL | Freq: Once | INTRAMUSCULAR | Status: AC | PRN
  Administered 2020-01-09: 100 mL via INTRAVENOUS

## 2020-01-09 MED ORDER — MORPHINE SULFATE (PF) 4 MG/ML IV SOLN
4.0000 mg | Freq: Once | INTRAVENOUS | Status: AC
  Administered 2020-01-09: 4 mg via INTRAVENOUS
  Filled 2020-01-09: qty 1

## 2020-01-09 MED ORDER — SODIUM CHLORIDE 0.9% FLUSH: 3.0000 mL | Freq: Once | INTRAVENOUS | Status: DC

## 2020-01-09 MED ORDER — ONDANSETRON HCL 4 MG/2ML IJ SOLN
4.0000 mg | Freq: Once | INTRAMUSCULAR | Status: AC
  Administered 2020-01-09: 4 mg via INTRAVENOUS
  Filled 2020-01-09: qty 2

## 2020-01-09 MED ORDER — OMEPRAZOLE 20 MG PO CPDR
20.0000 mg | DELAYED_RELEASE_CAPSULE | Freq: Two times a day (BID) | ORAL | 0 refills | Status: DC
Start: 2020-01-09 — End: 2020-01-20

## 2020-01-09 MED ORDER — DICYCLOMINE HCL 20 MG PO TABS
20.0000 mg | ORAL_TABLET | Freq: Two times a day (BID) | ORAL | 0 refills | Status: DC
Start: 2020-01-09 — End: 2020-12-07

## 2020-01-09 NOTE — ED Triage Notes (Signed)
Pt her for eval of generalized abdominal pain since yesterday. Ate something for lunch yesterday, 15 mins later had watery diarrhea, and then abdominal pain began after that. Sts it feels like gallbladder attacks she's had in the past but had her gallbladder removed.

## 2020-01-09 NOTE — ED Provider Notes (Signed)
[facility] EMERGENCY DEPARTMENT Provider Note   CSN: [id number] Arrival date & time: 01/09/20  1022     History Chief Complaint  Patient presents with  . Abdominal Pain    Diana Tate is a 49 y.o. female.  The history is provided by the patient. No language interpreter was used.  Abdominal Pain    49 year old female with history of GERD, prior surgical history including cholecystectomy presenting for evaluation of abdominal pain.  Patient report last night she ate some leftover food.  30 minutes after eating she developed abdominal cramping followed by diarrhea of nonbloody nonbilious contents.  Last night she was feeling bloated and noticed abdominal distention.  She thought she has "gas" and tried Gas-X without adequate relief.  Her pain still persist throughout the day today.  She endorsed nausea without vomiting.  No report of fever chills no chest pain shortness of breath, dysuria hematuria vaginal bleeding or vaginal discharge.  The pain in her abdomen did radiate towards her back.  Past Medical History:  Diagnosis Date  . GERD (gastroesophageal reflux disease)   . Hyperlipidemia   . Migraine    Classic    Patient Active Problem List   Diagnosis Date Noted  . History of COVID-19 11/21/2019  . Obstructive sleep apnea 02/20/2016  . Insomnia 12/03/2014  . Alternating constipation and diarrhea 05/16/2014  . Abdominal pain, epigastric 05/16/2014  . Esophageal reflux 12/12/2013  . Migraine headache with aura 03/30/2013  . 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 BREAST EXCISIONAL BIOPSY    . BREAST SURGERY     benign  . CHOLECYSTECTOMY  2006  . ENDOMETRIAL ABLATION  2007  . ESOPHAGOGASTRODUODENOSCOPY N/A 05/22/2014   SLF: 1. Schatzki ring was found 2. MIld non-erosive gastritis  . TONSILLECTOMY  2000     OB History   No obstetric history on file.     Family History    Problem Relation Age of Onset  . Hypertension Mother   . Cancer Maternal Uncle        lung  . Cancer Maternal Grandfather        skin  . Diabetes Other        second degree relative  . Colon cancer Neg Hx   . Esophageal cancer Neg Hx     Social History   Tobacco Use  . Smoking status: Former Smoker    Packs/day: 1.00    Years: 24.00    Pack years: 24.00    Types: Cigarettes  . Smokeless tobacco: Never Used  . Tobacco comment: Quit x 2 years  Substance Use Topics  . Alcohol use: No    Alcohol/week: 0.0 standard drinks    Comment: occ  . Drug use: No    Home Medications Prior to Admission medications   Medication Sig Start Date End Date Taking? Authorizing Provider  ondansetron (ZOFRAN ODT) 4 MG disintegrating tablet Place one tablet under tongue prn nausea 11/29/19   Mikey Kirschner, MD  pantoprazole (PROTONIX) 40 MG tablet TAKE 1 TABLET BY MOUTH TWICE A DAY 11/24/19   Kathyrn Drown, MD    Allergies    Lexapro [escitalopram oxalate]  Review of Systems   Review of Systems  Gastrointestinal: Positive for abdominal pain.  All other systems reviewed and are negative.   Physical Exam Updated Vital Signs BP 128/82 (BP Location: Left Arm)   Pulse 86   Temp 98.5 F (  36.9 C) (Oral)   Resp 16   Ht 5\' 5"  (1.651 m)   Wt 90.7 kg   SpO2 96%   BMI 33.28 kg/m   Physical Exam Vitals and nursing note reviewed.  Constitutional:      General: She is not in acute distress.    Appearance: She is well-developed.  HENT:     Head: Atraumatic.  Eyes:     Conjunctiva/sclera: Conjunctivae normal.  Cardiovascular:     Rate and Rhythm: Normal rate and regular rhythm.  Pulmonary:     Effort: Pulmonary effort is normal.     Breath sounds: Normal breath sounds.  Abdominal:     General: Abdomen is flat. Bowel sounds are normal.     Palpations: Abdomen is soft.     Tenderness: There is generalized abdominal tenderness (Mild diffuse abdominal tenderness no guarding or rebound  tenderness noted.  No pain at McBurney's point, negative Murphy sign.).  Musculoskeletal:     Cervical back: Neck supple.  Skin:    Findings: No rash.  Neurological:     Mental Status: She is alert.     ED Results / Procedures / Treatments   Labs (all labs ordered are listed, but only abnormal results are displayed) Labs Reviewed  COMPREHENSIVE METABOLIC PANEL - Abnormal; Notable for the following components:      Result Value   Glucose, Bld 107 (*)    Total Bilirubin 1.3 (*)    All other components within normal limits  CBC - Abnormal; Notable for the following components:   WBC 14.8 (*)    All other components within normal limits  URINALYSIS, ROUTINE W REFLEX MICROSCOPIC - Abnormal; Notable for the following components:   APPearance HAZY (*)    Hgb urine dipstick SMALL (*)    Leukocytes,Ua MODERATE (*)    Bacteria, UA RARE (*)    All other components within normal limits  LIPASE, BLOOD  I-STAT BETA HCG BLOOD, ED ([facility], [facility], [facility] ONLY)    EKG None  Radiology CT ABDOMEN PELVIS W CONTRAST  Result Date: 01/09/2020 CLINICAL DATA:  Right upper quadrant pain EXAM: CT ABDOMEN AND PELVIS WITH CONTRAST TECHNIQUE: Multidetector CT imaging of the abdomen and pelvis was performed using the standard protocol following bolus administration of intravenous contrast. CONTRAST:  146mL OMNIPAQUE IOHEXOL 300 MG/ML  SOLN COMPARISON:  None. FINDINGS: Lower chest: No acute abnormality. Hepatobiliary: No focal liver abnormality is seen. Status post cholecystectomy. No biliary dilatation. Pancreas: Mild infiltration of the fat near the pancreatic head. Spleen: Few nonspecific too small to characterize hypoattenuating lesions. Adrenals/Urinary Tract: Kidneys, adrenals, and bladder are unremarkable. Stomach/Bowel: Stomach is within normal limits. Bowel is normal in caliber. Normal appendix. Vascular/Lymphatic: No significant vascular findings are present. No enlarged abdominal or pelvic lymph nodes.  Reproductive: Uterus and bilateral adnexa are unremarkable. Other: No ascites.  Abdominal wall is unremarkable. Musculoskeletal: No acute osseous abnormality. IMPRESSION: Mild fat infiltration near the pancreatic head raising the possibility of acute pancreatitis. Otherwise unremarkable. Electronically Signed   By: Macy Mis M.D.   On: 01/09/2020 14:58    Procedures Procedures (including critical care time)  Medications Ordered in ED Medications  sodium chloride flush (NS) 0.9 % injection 3 mL (has no administration in time range)  morphine 4 MG/ML injection 4 mg (4 mg Intravenous Given 01/09/20 1241)  ondansetron (ZOFRAN) injection 4 mg (4 mg Intravenous Given 01/09/20 1241)  iohexol (OMNIPAQUE) 300 MG/ML solution 100 mL (100 mLs Intravenous Contrast Given 01/09/20 1435)  ED Course  I have reviewed the triage vital signs and the nursing notes.  Pertinent labs & imaging results that were available during my care of the patient were reviewed by me and considered in my medical decision making (see chart for details).    MDM Rules/Calculators/A&P                      BP 102/63   Pulse 83   Temp 98.5 F (36.9 C) (Oral)   Resp 16   Ht 5\' 5"  (1.651 m)   Wt 90.7 kg   SpO2 97%   BMI 33.28 kg/m   Final Clinical Impression(s) / ED Diagnoses Final diagnoses:  Nausea vomiting and diarrhea    Rx / DC Orders ED Discharge Orders         Ordered    dicyclomine (BENTYL) 20 MG tablet  2 times daily     01/09/20 1520    omeprazole (PRILOSEC) 20 MG capsule  2 times daily before meals     01/09/20 1520         12:28 PM Patient here with abdominal pain associate nausea vomiting diarrhea after eating expired food.  Suspect likely viral GI source.  She does have an elevated count of 14.8.  An abdominal pelvis CT scan ordered.  Will provide symptomatic treatment.  3:18 PM Urine shows no signs of urinary tract infection.  Pregnancy test is negative.  Normal lipase.  Electrolyte panels  are reassuring.  An abdominal pelvis CT scan demonstrate mild fatty infiltration near the pancreatic head raising possibility for acute pancreatitis.  However in the setting of normal lipase, my suspicion for pancreatitis is low.  I encourage patient to follow a clear liquid diet, will prescribe medication for symptomatic control.  Outpatient follow-up recommended.  Return precaution discussed.  Patient voiced understanding and agrees with plan.   Domenic Moras, PA-C 01/09/20 Roman [place], [place], DO 01/09/20 1521

## 2020-01-09 NOTE — ED Notes (Signed)
Got patient on the monitor into a gown got patient a warm blanket patient is resting with call bell in reach and family at bedside

## 2020-01-09 NOTE — ED Notes (Signed)
Pt transported to CT ?

## 2020-01-09 NOTE — ED Notes (Signed)
Pt verbalized understanding of discharge instructions. Follow up care and prescriptions reviewed, pt had no further questions. 

## 2020-01-09 NOTE — Telephone Encounter (Signed)
Pt having upper abdominal pain. Started yesterday. One epidsode of diarrhea yesterday. No fever. Pt states pain is a 8 or 9 out of 10. Worse after eating last night and did not sleep much due to the pain. Pain is constant and feels like a twisting pain. Consult with dr Nicki Reaper and advised to go to ed. Pt states she will go to [place]

## 2020-01-10 ENCOUNTER — Telehealth: Payer: Self-pay | Admitting: Family Medicine

## 2020-01-10 NOTE — Telephone Encounter (Signed)
Patient called she went to ER yesterday and needs a HFU appt sending message back when Dr Nicki Reaper wants to see her no appt available

## 2020-01-10 NOTE — Telephone Encounter (Signed)
11:30 on  Wednesday 

## 2020-01-11 ENCOUNTER — Encounter: Payer: Self-pay | Admitting: Family Medicine

## 2020-01-11 ENCOUNTER — Ambulatory Visit (INDEPENDENT_AMBULATORY_CARE_PROVIDER_SITE_OTHER): Payer: BC Managed Care – PPO | Admitting: Family Medicine

## 2020-01-11 ENCOUNTER — Other Ambulatory Visit: Payer: Self-pay

## 2020-01-11 VITALS — BP 128/70 | Temp 97.9°F | Wt 200.2 lb

## 2020-01-11 DIAGNOSIS — R1084 Generalized abdominal pain: Secondary | ICD-10-CM

## 2020-01-11 DIAGNOSIS — Z79899 Other long term (current) drug therapy: Secondary | ICD-10-CM

## 2020-01-11 LAB — POCT URINALYSIS DIPSTICK
Spec Grav, UA: 1.01 (ref 1.010–1.025)
Urobilinogen, UA: 0.2 E.U./dL
pH, UA: 7 (ref 5.0–8.0)

## 2020-01-11 NOTE — Progress Notes (Signed)
Subjective:    Patient ID: Diana Tate, female    DOB: Feb 26, 1971, 49 y.o.   MRN: [id number]  Abdominal Pain This is a new problem. Episode onset: Sunday afternoon. The problem has been resolved. The pain is located in the generalized abdominal region. Pain radiation: did radiate to back  Pertinent negatives include no diarrhea or nausea.  Pt went to ER Monday and was diagnosed with pancreatitis, not sure if the cause was stomach bug or food poisoning. Pt had no n/v/d. Pt is still feeling tire and no energy but stomach pain is gone. ER prescribed Dicyclomine 20 mg and Omeprazole 20 mg  Patient states his pain hit her out of the blue in the mid abdomen got worse then radiated throughout the whole abdomen also some lower abdominal discomfort but denies high fever chills patient states the pain was severe enough to keep her awake When she went to the ER lab work overall looked fairly good but CAT scan did show possibility of early pancreatitis   Review of Systems  Constitutional: Negative for activity change, appetite change and fatigue.  HENT: Negative for congestion and rhinorrhea.   Respiratory: Negative for cough and shortness of breath.   Cardiovascular: Negative for chest pain and leg swelling.  Gastrointestinal: Positive for abdominal pain. Negative for diarrhea and nausea.  Endocrine: Negative for polydipsia and polyphagia.  Skin: Negative for color change.  Neurological: Negative for dizziness and weakness.  Psychiatric/Behavioral: Negative for behavioral problems and confusion.       Objective:   Physical Exam Vitals reviewed.  Constitutional:      General: She is not in acute distress. HENT:     Head: Normocephalic and atraumatic.  Eyes:     General:        Right eye: No discharge.        Left eye: No discharge.  Neck:     Trachea: No tracheal deviation.  Cardiovascular:     Rate and Rhythm: Normal rate and regular rhythm.     Heart sounds: Normal heart  sounds. No murmur.  Pulmonary:     Effort: Pulmonary effort is normal. No respiratory distress.     Breath sounds: Normal breath sounds.  Lymphadenopathy:     Cervical: No cervical adenopathy.  Skin:    General: Skin is warm and dry.  Neurological:     Mental Status: She is alert.     Coordination: Coordination normal.  Psychiatric:        Behavior: Behavior normal.    Significant abdominal pain throughout the abdomen no guarding or rebound Axilla left and right normal Patient states she was having tenderness under the left axilla but this is gone away She was concerned about possibility of a growth and nurse was present for this part of the exam       Assessment & Plan:  1. Generalized abdominal pain Because of significant abdominal pain we will look at lipase amylase liver function kidney function and CBC patient did have elevated white blood count we await the results of this although it is possible IBS is going on the fact that her white blood count was elevated and there was fatty infiltration at the head of the pancreas it points toward the possibility of a pancreatitis at this present time does not mean referral to gastroenterology does not need to have additional imaging await the results of the lab work it is also possible there could be a urinary tract infection we will await  the results of the the urine culture and may need to put her on antibiotics - POCT Urinalysis Dipstick - Lipase - Amylase - Hepatic function panel - Basic Metabolic Panel (BMET) - CBC with Differential  2. High risk medication use See above - Lipase - Amylase - Hepatic function panel - Basic Metabolic Panel (BMET) - CBC with Differential

## 2020-01-11 NOTE — Telephone Encounter (Signed)
Appointment at 3:30 today per doctor

## 2020-01-12 ENCOUNTER — Other Ambulatory Visit: Payer: Self-pay | Admitting: Family Medicine

## 2020-01-12 DIAGNOSIS — R1084 Generalized abdominal pain: Secondary | ICD-10-CM

## 2020-01-12 LAB — CBC WITH DIFFERENTIAL/PLATELET
Basophils Absolute: 0 10*3/uL (ref 0.0–0.2)
Basos: 0 %
EOS (ABSOLUTE): 0.2 10*3/uL (ref 0.0–0.4)
Eos: 2 %
Hematocrit: 36.4 % (ref 34.0–46.6)
Hemoglobin: 11.9 g/dL (ref 11.1–15.9)
Immature Grans (Abs): 0 10*3/uL (ref 0.0–0.1)
Immature Granulocytes: 0 %
Lymphocytes Absolute: 2.4 10*3/uL (ref 0.7–3.1)
Lymphs: 22 %
MCH: 27.8 pg (ref 26.6–33.0)
MCHC: 32.7 g/dL (ref 31.5–35.7)
MCV: 85 fL (ref 79–97)
Monocytes Absolute: 1.1 10*3/uL — ABNORMAL HIGH (ref 0.1–0.9)
Monocytes: 10 %
Neutrophils Absolute: 7.4 10*3/uL — ABNORMAL HIGH (ref 1.4–7.0)
Neutrophils: 66 %
Platelets: 265 10*3/uL (ref 150–450)
RBC: 4.28 x10E6/uL (ref 3.77–5.28)
RDW: 13.5 % (ref 11.7–15.4)
WBC: 11.1 10*3/uL — ABNORMAL HIGH (ref 3.4–10.8)

## 2020-01-12 LAB — HEPATIC FUNCTION PANEL
ALT: 11 IU/L (ref 0–32)
AST: 9 IU/L (ref 0–40)
Albumin: 4.2 g/dL (ref 3.8–4.8)
Alkaline Phosphatase: 80 IU/L (ref 48–121)
Bilirubin Total: 0.3 mg/dL (ref 0.0–1.2)
Bilirubin, Direct: 0.12 mg/dL (ref 0.00–0.40)
Total Protein: 6.6 g/dL (ref 6.0–8.5)

## 2020-01-12 LAB — BASIC METABOLIC PANEL
BUN/Creatinine Ratio: 17 (ref 9–23)
BUN: 11 mg/dL (ref 6–24)
CO2: 25 mmol/L (ref 20–29)
Calcium: 9.4 mg/dL (ref 8.7–10.2)
Chloride: 100 mmol/L (ref 96–106)
Creatinine, Ser: 0.65 mg/dL (ref 0.57–1.00)
GFR calc Af Amer: 121 mL/min/{1.73_m2} (ref >59)
GFR calc non Af Amer: 105 mL/min/{1.73_m2} (ref >59)
Glucose: 68 mg/dL (ref 65–99)
Potassium: 4.5 mmol/L (ref 3.5–5.2)
Sodium: 138 mmol/L (ref 134–144)

## 2020-01-12 LAB — AMYLASE: Amylase: 33 U/L (ref 31–110)

## 2020-01-12 LAB — LIPASE: Lipase: 25 U/L (ref 14–72)

## 2020-01-14 LAB — URINE CULTURE

## 2020-01-14 LAB — SPECIMEN STATUS REPORT

## 2020-01-17 MED ORDER — AMOXICILLIN 500 MG PO CAPS
500.0000 mg | ORAL_CAPSULE | Freq: Three times a day (TID) | ORAL | 0 refills | Status: DC
Start: 2020-01-17 — End: 2020-12-07

## 2020-01-20 ENCOUNTER — Other Ambulatory Visit: Payer: Self-pay | Admitting: *Deleted

## 2020-02-16 ENCOUNTER — Other Ambulatory Visit: Payer: Self-pay | Admitting: Family Medicine

## 2020-06-20 ENCOUNTER — Other Ambulatory Visit: Payer: Self-pay | Admitting: Obstetrics and Gynecology

## 2020-06-20 DIAGNOSIS — Z1231 Encounter for screening mammogram for malignant neoplasm of breast: Secondary | ICD-10-CM

## 2020-06-25 ENCOUNTER — Other Ambulatory Visit: Payer: Self-pay | Admitting: Family Medicine

## 2020-07-02 ENCOUNTER — Other Ambulatory Visit: Payer: Self-pay

## 2020-07-02 ENCOUNTER — Ambulatory Visit
Admission: RE | Admit: 2020-07-02 | Discharge: 2020-07-02 | Disposition: A | Discharge status: Home / Self Care | Payer: BC Managed Care – PPO | Source: Ambulatory Visit | Attending: Obstetrics and Gynecology | Admitting: Obstetrics and Gynecology

## 2020-07-02 DIAGNOSIS — Z1231 Encounter for screening mammogram for malignant neoplasm of breast: Secondary | ICD-10-CM

## 2020-07-02 IMAGING — MG DIGITAL SCREENING BILAT W/ TOMO W/ CAD
6 of 12 series · 6 of 36 positions shown · non-contrast
Comparison: Previous exam(s).

CLINICAL DATA: Screening.

EXAM:
DIGITAL SCREENING BILATERAL MAMMOGRAM WITH TOMO AND CAD

[L CC synth-2D]
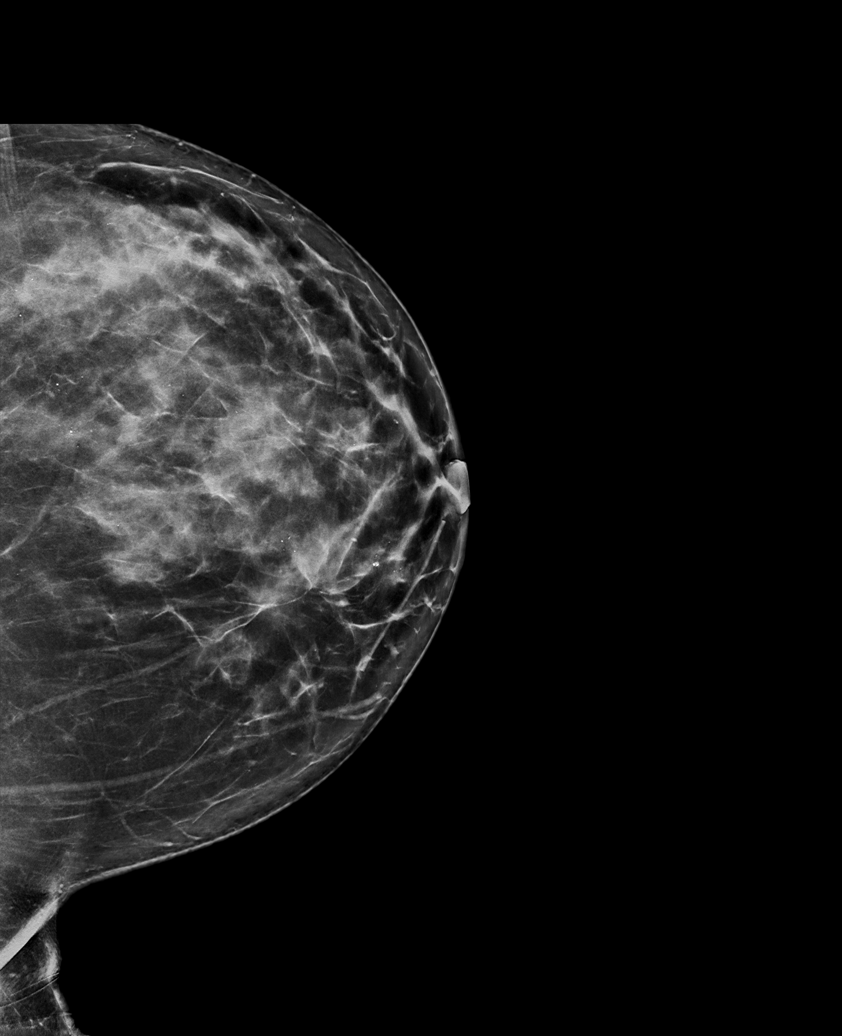

[R CC synth-2D (1 of 2)]
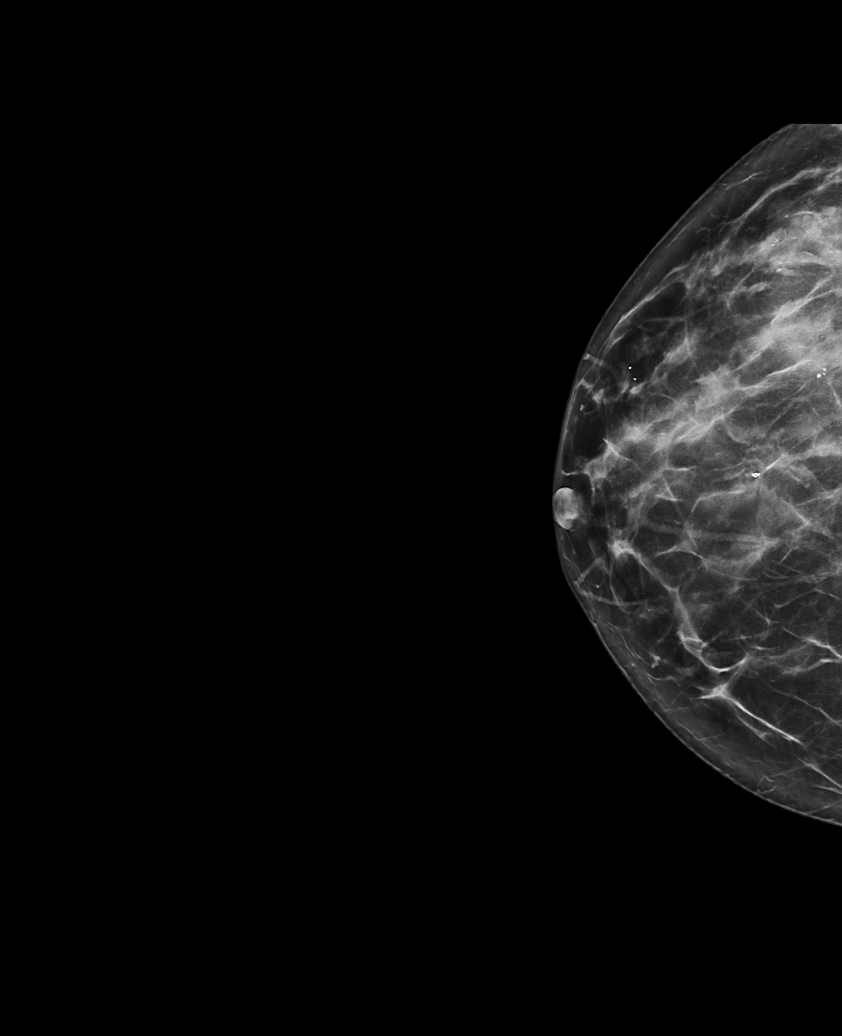

[R CC synth-2D (2 of 2)]
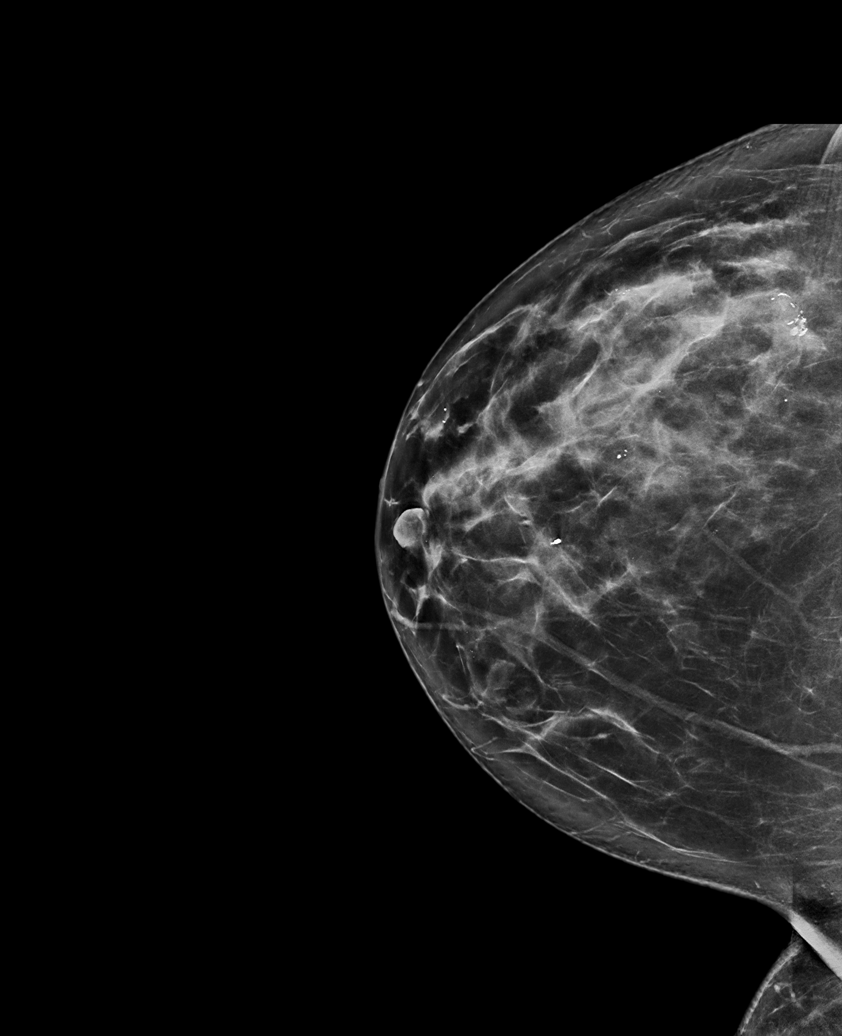

[L MLO synth-2D]
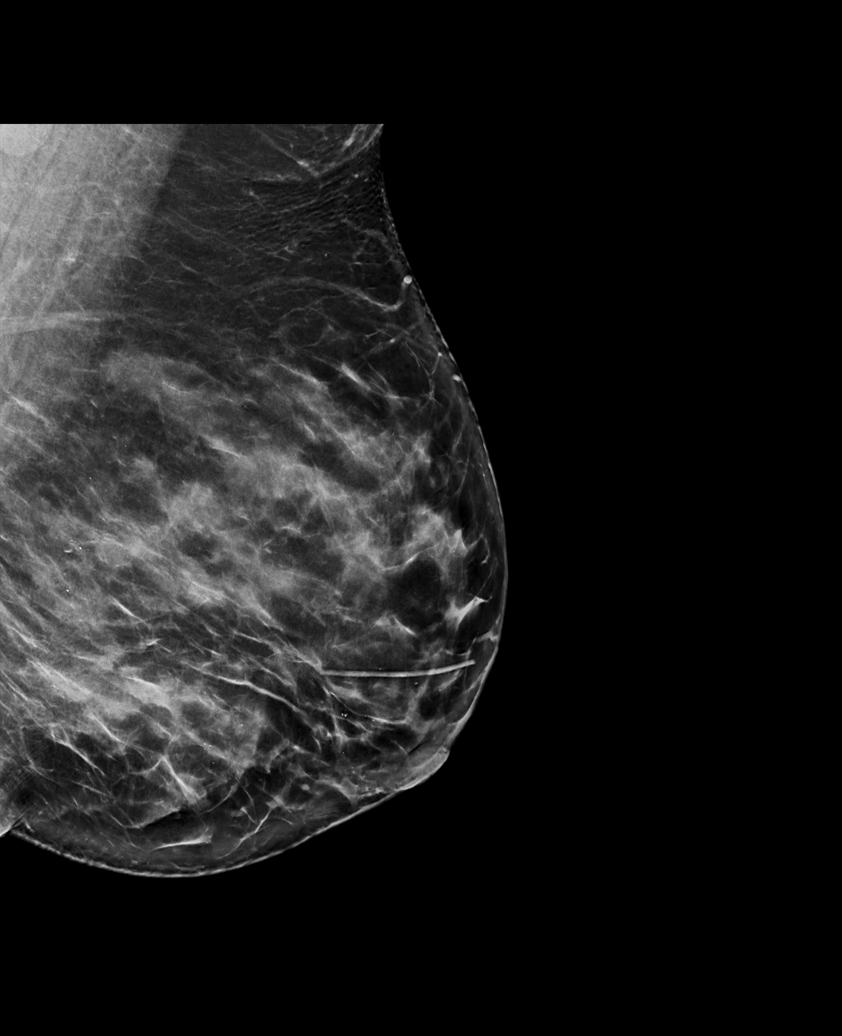

[R MLO synth-2D (1 of 2)]
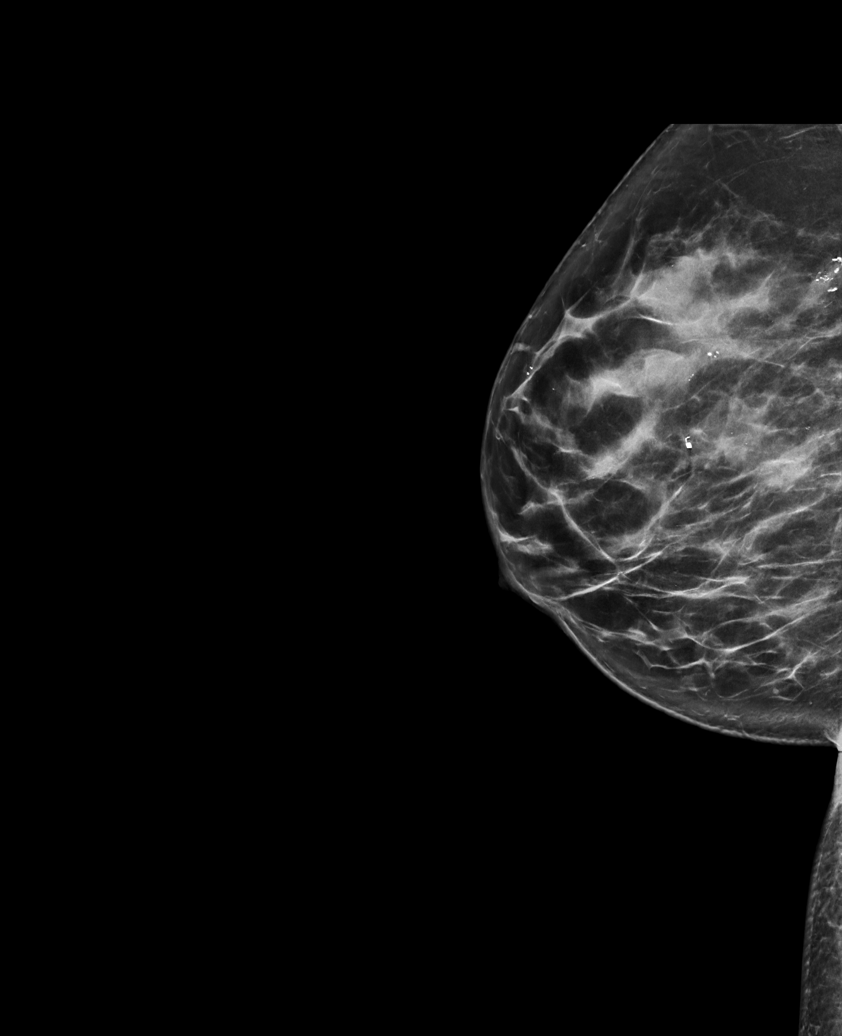

[R MLO synth-2D (2 of 2)]
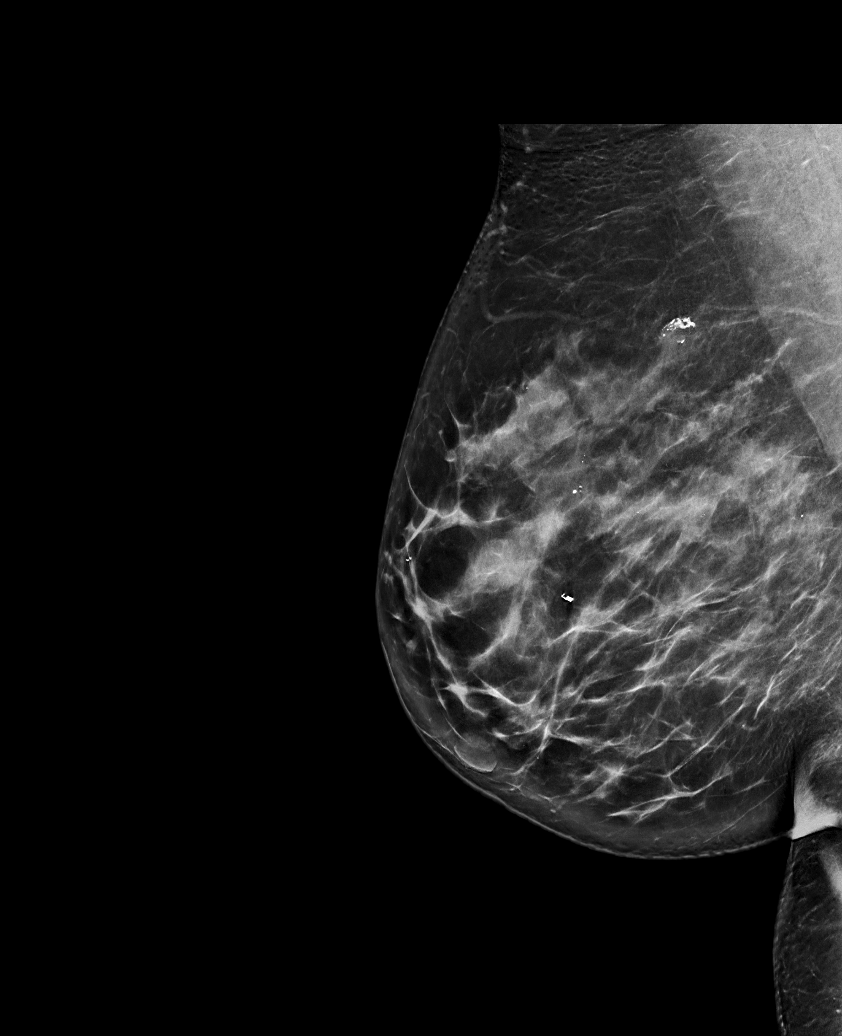

[6 of 36 positions shown; findings below may reference images not displayed]

ACR Breast Density Category c: The breast tissue is heterogeneously
dense, which may obscure small masses.
FINDINGS: In the right breast, a possible focal asymmetry warrants further
evaluation. In addition, calcifications warrant additional
evaluation. In the left breast, no findings suspicious for
malignancy. Images were processed with CAD.
IMPRESSION: Further evaluation is suggested for possible focal asymmetry and
calcifications in the right breast.

RECOMMENDATION:
Diagnostic mammogram and possibly ultrasound of the right breast.
(Code:[5Z])

The patient will be contacted regarding the findings, and additional
imaging will be scheduled.

BI-RADS CATEGORY  0: Incomplete. Need additional imaging evaluation
and/or prior mammograms for comparison.

## 2020-07-06 ENCOUNTER — Other Ambulatory Visit: Payer: Self-pay | Admitting: Obstetrics and Gynecology

## 2020-07-06 DIAGNOSIS — R928 Other abnormal and inconclusive findings on diagnostic imaging of breast: Secondary | ICD-10-CM

## 2020-08-01 ENCOUNTER — Ambulatory Visit
Admission: RE | Admit: 2020-08-01 | Discharge: 2020-08-01 | Disposition: A | Discharge status: Home / Self Care | Payer: BC Managed Care – PPO | Source: Ambulatory Visit | Attending: Obstetrics and Gynecology | Admitting: Obstetrics and Gynecology

## 2020-08-01 ENCOUNTER — Ambulatory Visit: Payer: BC Managed Care – PPO

## 2020-08-01 ENCOUNTER — Other Ambulatory Visit: Payer: Self-pay

## 2020-08-01 ENCOUNTER — Other Ambulatory Visit: Payer: Self-pay | Admitting: Obstetrics and Gynecology

## 2020-08-01 DIAGNOSIS — R928 Other abnormal and inconclusive findings on diagnostic imaging of breast: Secondary | ICD-10-CM

## 2020-08-01 IMAGING — MG MM DIGITAL DIAGNOSTIC UNILAT*R* W/ TOMO W/ CAD
7 series · 8 of 15 positions shown · non-contrast
Comparison: Previous exam(s).

CLINICAL DATA: Recall from screening mammography with
tomosynthesis, possible asymmetry in the OUTER RIGHT breast at
POSTERIOR depth and a separate new group of calcifications in the
UPPER OUTER QUADRANT of the RIGHT breast.

EXAM:
DIGITAL DIAGNOSTIC UNILATERAL RIGHT MAMMOGRAM WITH TOMO AND CAD

[R CC]
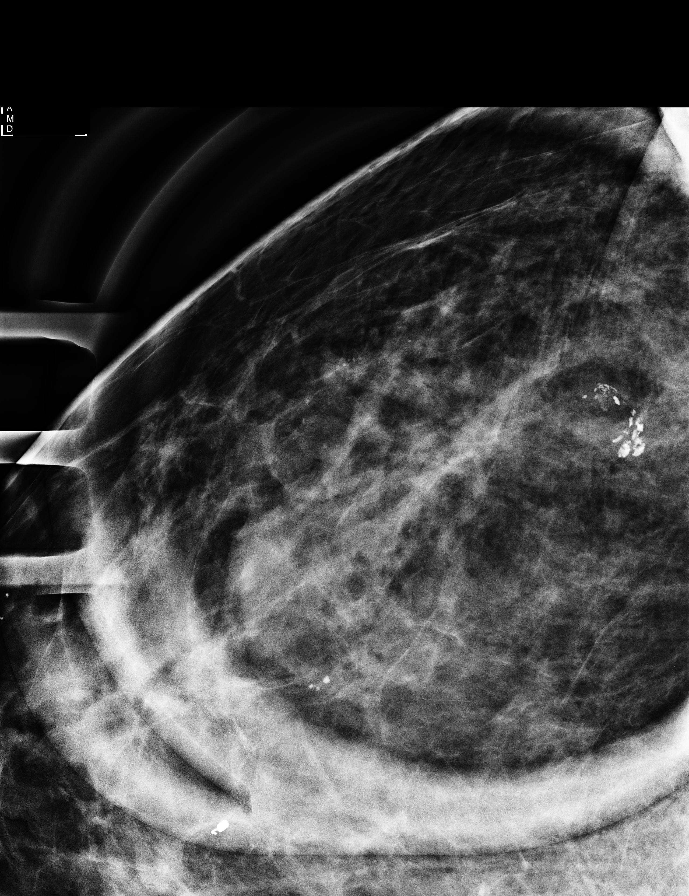

[R ML (1 of 2)]
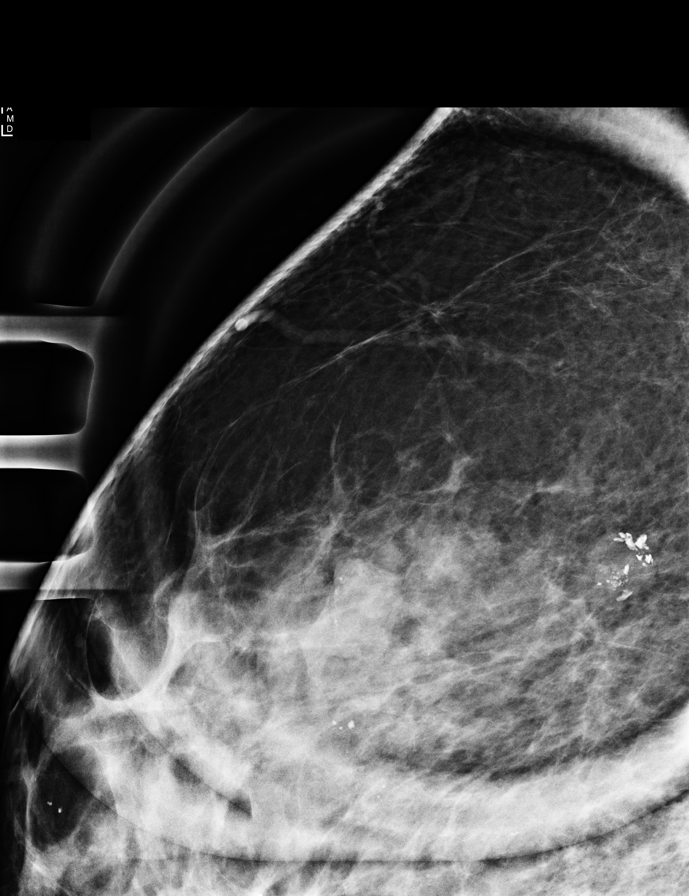

[R ML (2 of 2)]
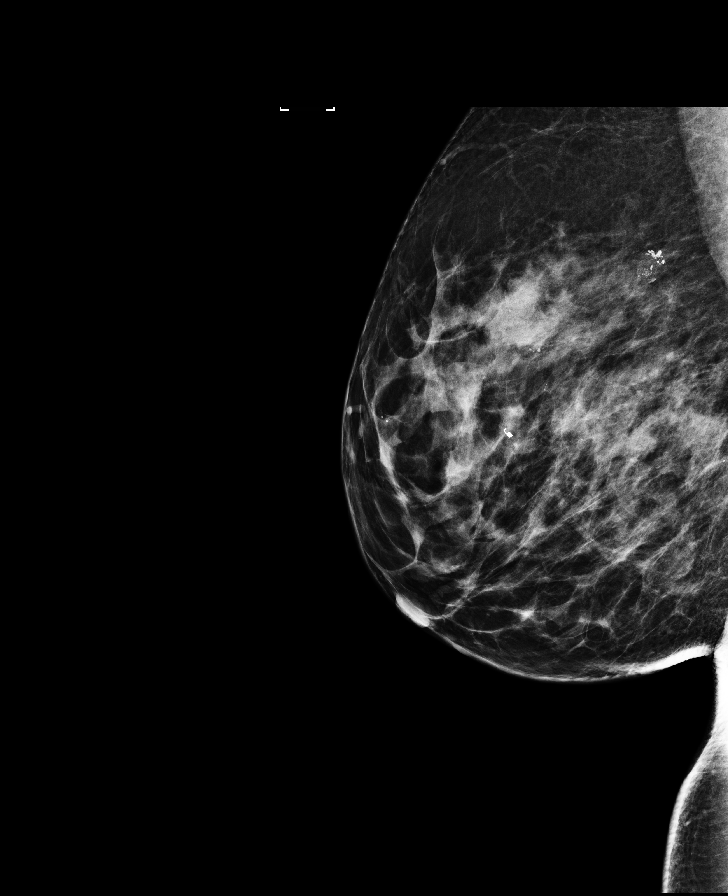

[R CC synth-2D]
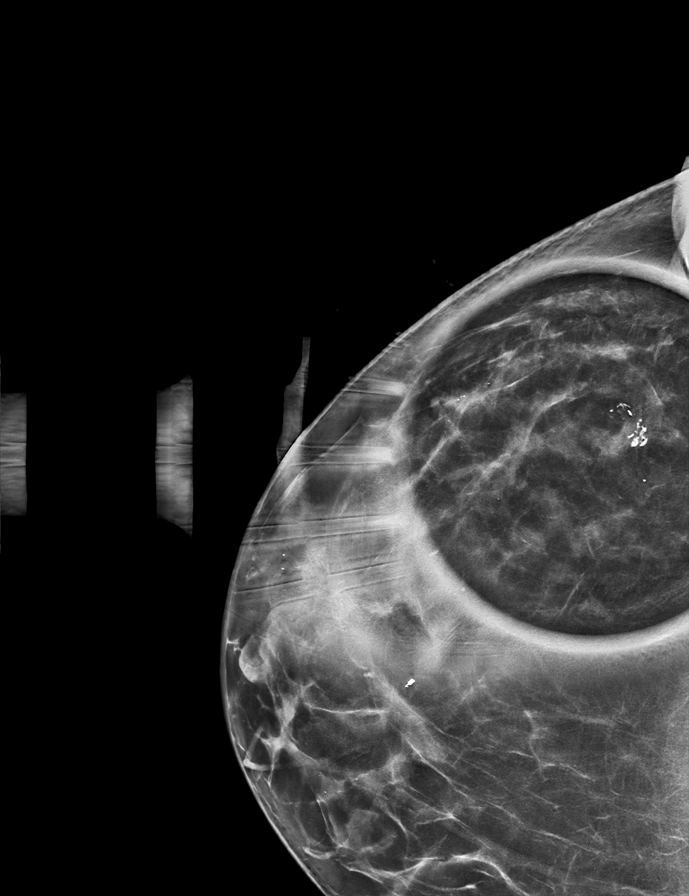

[R MLO synth-2D]
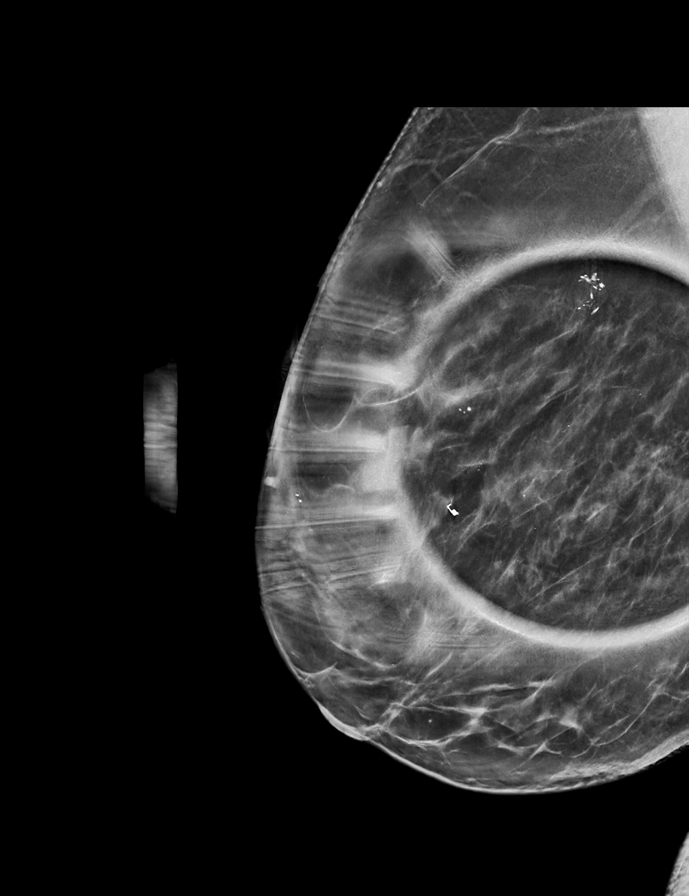

[R CC tomo · 2 of 78 frames shown]
[frame 26/78]
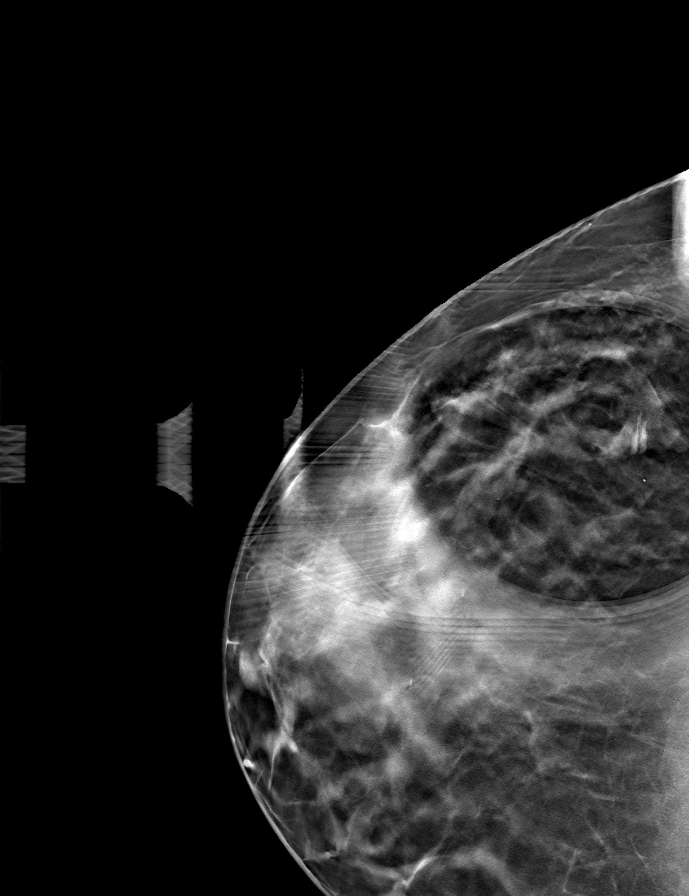
[frame 39/78]
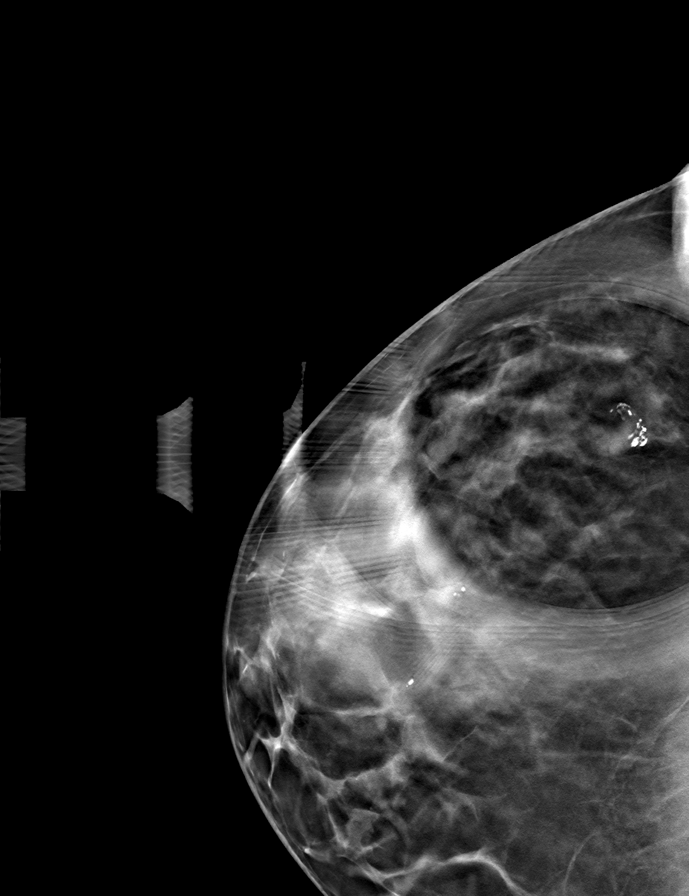

[R MLO tomo · tomo slice 47/93.0]
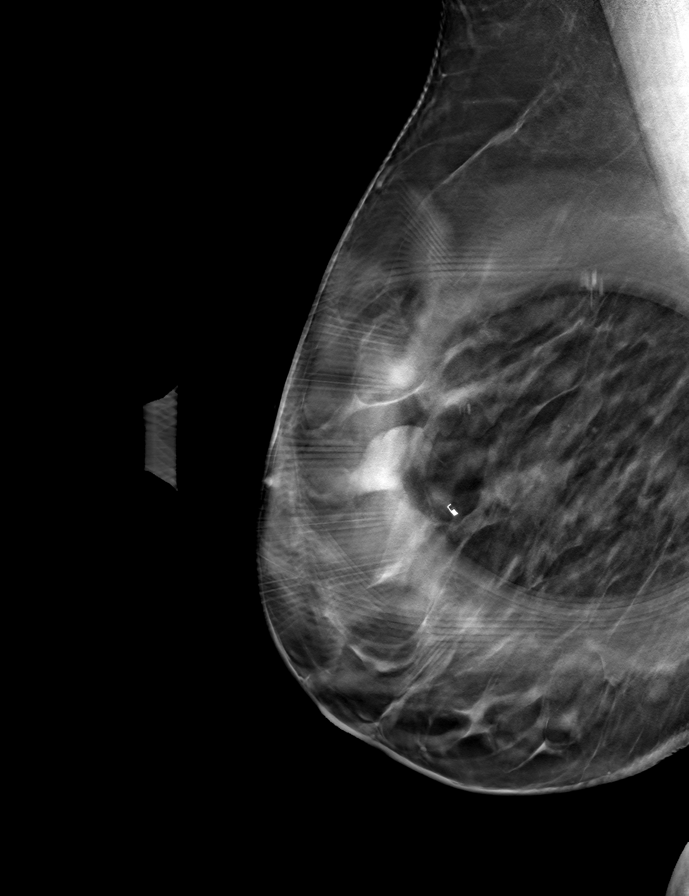

[8 of 15 positions shown; findings below may reference images not displayed]

ACR Breast Density Category c: The breast tissue is heterogeneously
dense, which may obscure small masses.
FINDINGS: Tomosynthesis and synthesized spot-compression CC and MLO views of
the area of concern in the RIGHT breast, digital 2D full field
mediolateral view of the RIGHT breast and digital 2D spot
magnification CC and mediolateral views of the RIGHT breast
calcifications were obtained. The full field mediolateral image was
were processed with CAD.

The possible focal asymmetry in the OUTER breast at POSTERIOR depth
disperses with compression, indicating overlapping fibroglandular
tissue. There is no underlying mass or architectural distortion.

Spot magnification views confirm a 1.2 cm group of likely benign
dystrophic appearing calcifications in the UPPER OUTER QUADRANT at
POSTERIOR depth which may be associated with a benign oil cyst, as
there is fat within the area of calcifications.
IMPRESSION: Likely benign 1.2 cm group of calcifications involving the UPPER
OUTER QUADRANT of the RIGHT breast at POSTERIOR depth, possibly
associated with a benign oil cyst.

RECOMMENDATION:
Diagnostic RIGHT mammogram in 6 months to include spot magnification
views of the RIGHT breast calcifications.

I have discussed the findings and recommendations with the patient.
If applicable, a reminder letter will be sent to the patient
regarding the next appointment.

BI-RADS CATEGORY  3: Probably benign.

## 2020-09-10 LAB — HM COLONOSCOPY

## 2020-09-20 ENCOUNTER — Encounter: Payer: Self-pay | Admitting: Family Medicine

## 2020-10-13 ENCOUNTER — Other Ambulatory Visit: Payer: Self-pay | Admitting: Family Medicine

## 2020-10-15 NOTE — Telephone Encounter (Signed)
Needs appt. Then route back

## 2020-12-03 ENCOUNTER — Ambulatory Visit: Payer: BC Managed Care – PPO | Admitting: Primary Care

## 2020-12-04 ENCOUNTER — Ambulatory Visit: Payer: BC Managed Care – PPO | Admitting: Family Medicine

## 2020-12-07 ENCOUNTER — Encounter: Payer: Self-pay | Admitting: Primary Care

## 2020-12-07 ENCOUNTER — Other Ambulatory Visit: Payer: Self-pay

## 2020-12-07 ENCOUNTER — Ambulatory Visit (INDEPENDENT_AMBULATORY_CARE_PROVIDER_SITE_OTHER): Payer: Self-pay | Admitting: Primary Care

## 2020-12-07 VITALS — BP 108/70 | HR 85 | Temp 98.4°F | Ht 66.0 in | Wt 190.2 lb

## 2020-12-07 DIAGNOSIS — Z87891 Personal history of nicotine dependence: Secondary | ICD-10-CM | POA: Insufficient documentation

## 2020-12-07 DIAGNOSIS — G4733 Obstructive sleep apnea (adult) (pediatric): Secondary | ICD-10-CM

## 2020-12-07 NOTE — Patient Instructions (Addendum)
Recommendations:   - Discuss getting labs such as B12, vit D, thyroid, CBC, iron panel for fatigue with PCP - I will talk with Dr. Ander Slade about medication to help with residual daytime fatigue despite compliant with CPAP. Two examples of this type of medication are Modafinil (Provigil) or Solriamfetol (Sunosi) - Referring you to lung cancer screening program - Placing order for full face CPAP mask  Orders: - Resmed airfit F20 Full face mask DME: [employer] (ordered)  Referral: - Lung cancer screening program   Follow-up: - 6 months with Dr. Ander Slade or APP     Modafinil tablets What is this medicine? MODAFINIL (moe DAF i nil) is used to treat excessive sleepiness caused by certain sleep disorders. This includes narcolepsy, sleep apnea, and shift work sleep disorder. This medicine may be used for other purposes; ask your health care provider or pharmacist if you have questions. COMMON BRAND NAME(S): Provigil What should I tell my health care provider before I take this medicine? They need to know if you have any of these conditions:  history of depression, mania, or other mental disorder  kidney disease  liver disease  an unusual or allergic reaction to modafinil, other medicines, foods, dyes, or preservatives  pregnant or trying to get pregnant  breast-feeding How should I use this medicine? Take this medicine by mouth with a glass of water. Follow the directions on the prescription label. Take your doses at regular intervals. Do not take your medicine more often than directed. Do not stop taking except on your doctor's advice. A special MedGuide will be given to you by the pharmacist with each prescription and refill. Be sure to read this information carefully each time. Talk to your pediatrician regarding the use of this medicine in children. This medicine is not approved for use in children. Overdosage: If you think you have taken too much of this medicine contact a poison  control center or emergency room at once. NOTE: This medicine is only for you. Do not share this medicine with others. What if I miss a dose? If you miss a dose, take it as soon as you can. If it is almost time for your next dose, take only that dose. Do not take double or extra doses. What may interact with this medicine? Do not take this medicine with any of the following medications:  amphetamine or dextroamphetamine  dexmethylphenidate or methylphenidate  medicines called MAO Inhibitors like Nardil, Parnate, Marplan, Eldepryl  pemoline  procarbazine This medicine may also interact with the following medications:  antifungal medicines like itraconazole or ketoconazole  barbiturates like phenobarbital  birth control pills or other hormone-containing birth control devices or implants  carbamazepine  cyclosporine  diazepam  medicines for depression, anxiety, or psychotic disturbances  phenytoin  propranolol  triazolam  warfarin This list may not describe all possible interactions. Give your health care provider a list of all the medicines, herbs, non-prescription drugs, or dietary supplements you use. Also tell them if you smoke, drink alcohol, or use illegal drugs. Some items may interact with your medicine. What should I watch for while using this medicine? Visit your doctor or health care provider for regular checks on your progress. The full effects of this medicine may not be seen right away. This medicine may cause serious skin reactions. They can happen weeks to months after starting the medicine. Contact your health care provider right away if you notice fevers or flu-like symptoms with a rash. The rash may be red or  purple and then turn into blisters or peeling of the skin. Or, you might notice a red rash with swelling of the face, lips or lymph nodes in your neck or under your arms. This medicine may affect your concentration, function, or may hide signs that you  are tired. You may get dizzy. Do not drive, use machinery, or do anything that needs mental alertness until you know how this drug affects you. Alcohol can make you more dizzy and may interfere with your response to this medicine or your alertness. Avoid alcoholic drinks. Birth control pills may not work properly while you are taking this medicine. Talk to your doctor about using an extra method of birth control. It is unknown if the effects of this medicine will be increased by the use of caffeine. Caffeine is available in many foods, beverages, and medications. Ask your doctor if you should limit or change your intake of caffeine-containing products while on this medicine. What side effects may I notice from receiving this medicine? Side effects that you should report to your doctor or health care professional as soon as possible:  allergic reactions like skin rash, itching or hives, swelling of the face, lips, or tongue  anxiety  breathing problems  chest pain  fast, irregular heartbeat  hallucinations  increased blood pressure  rash, fever, and swollen lymph nodes  redness, blistering, peeling or loosening of the skin, including inside the mouth  sore throat, fever, or chills  suicidal thoughts or other mood changes  tremors  vomiting Side effects that usually do not require medical attention (report to your doctor or health care professional if they continue or are bothersome):  headache  nausea, diarrhea, or stomach upset  nervousness  trouble sleeping This list may not describe all possible side effects. Call your doctor for medical advice about side effects. You may report side effects to FDA at 1-800-FDA-1088. Where should I keep my medicine? Keep out of the reach of children. This medicine can be abused. Keep your medicine in a safe place to protect it from theft. Do not share this medicine with anyone. Selling or giving away this medicine is dangerous and against  the law. This medicine may cause accidental overdose and death if taken by other adults, children, or pets. Mix any unused medicine with a substance like cat litter or coffee grounds. Then throw the medicine away in a sealed container like a sealed bag or a coffee can with a lid. Do not use the medicine after the expiration date. Store at room temperature between 20 and 25 degrees C (68 and 77 degrees F). NOTE: This sheet is a summary. It may not cover all possible information. If you have questions about this medicine, talk to your doctor, pharmacist, or health care provider.  2021 Elsevier/Gold Standard (2018-11-10 10:08:08)   Solriamfetol Oral Tablets What is this medicine? SOLRIAMFETOL (sol ri AM fe tol) is a medication known as a dopamine and norepinephrine reuptake inhibitor (DNRI). It is used to treat excessive sleepiness caused by certain sleep disorders including narcolepsy and obstructive sleep apnea. This medicine may be used for other purposes; ask your health care provider or pharmacist if you have questions. COMMON BRAND NAME(S): SUNOSI What should I tell my health care provider before I take this medicine? They need to know if you have any of these conditions:  bipolar disorder  diabetes  heart disease  high blood pressure  high cholesterol  history of drug abuse or alcohol abuse problem  history of stroke  kidney disease  schizophrenia  an unusual or allergic reaction to solriamfetol, other medicines, foods, dyes, or preservatives  pregnant or trying to get pregnant  breast-feeding How should I use this medicine? Take this medicine by mouth with water. Take it as directed on the prescription label at the same time every day. You can take it with or without food. If it upsets your stomach, take it with food. Keep taking it unless your health care provider tells you to stop. A special MedGuide will be given to you by the pharmacist with each prescription and  refill. Be sure to read this information carefully each time. Talk to your health care provider about the use of this medicine in children. Special care may be needed. Overdosage: If you think you have taken too much of this medicine contact a poison control center or emergency room at once. NOTE: This medicine is only for you. Do not share this medicine with others. What if I miss a dose? If you miss a dose, take it as soon as you can. However, avoid taking it within 9 hours of your planned bedtime, since you may find it harder to go to sleep. If it is almost time for your next dose, take only that dose. Do not take double or extra doses. What may interact with this medicine? Do not take this medicine with any of the following medications:  MAOIs like Carbex, Eldepryl, Marplan, Nardil, and Parnate This medicine may also interact with the following medications:  certain medicines for Parkinson's disease like levodopa, pramipexole, or ropinirole  medicines that increase blood pressure or heart rate This list may not describe all possible interactions. Give your health care provider a list of all the medicines, herbs, non-prescription drugs, or dietary supplements you use. Also tell them if you smoke, drink alcohol, or use illegal drugs. Some items may interact with your medicine. What should I watch for while using this medicine? Visit your health care provider for regular checks on your progress. Tell your health care provider if your symptoms do not start to get better or if they get worse. This medicine has a risk of abuse and dependence. Your health care provider will check you for this while you take this medicine. What side effects may I notice from receiving this medicine? Side effects that you should report to your doctor or health care professional as soon as possible:  allergic reactions (skin rash, itching, and hives; swelling of the face, lips, or tongue)  anxiety  changes in  emotions or moods  decreased need for sleep  elevated mood  impulsive behavior  fast, irregular heartbeat  hallucinations  increase in blood pressure  irritability  loss of contact with reality  pain or tightness in the chest, neck, back, or arms  stroke (changes in vision; confusion; trouble speaking or understanding; severe headaches; sudden numbness or weakness of the face, arm or leg; trouble walking; dizziness; loss of balance or coordination) Side effects that usually do not require medical attention (report these to your doctor or health care professional if they continue or are bothersome):  headache  dry mouth  excessive sweating  lack or loss of appetite  nausea, vomiting  trouble sleeping This list may not describe all possible side effects. Call your doctor for medical advice about side effects. You may report side effects to FDA at 1-800-FDA-1088. Where should I keep my medicine? Keep out of the reach of children and pets. This medicine  can be abused. Keep it in a safe place to protect it from theft. Do not share it with anyone. It is only for you. Selling or giving away this medicine is dangerous and against the law. Store at room temperature between 20 and 25 degrees C (68 and 77 degrees F). Get rid of any unused medicine after the expiration date. To get rid of medicines that are no longer needed or have expired:  Take the medicine to a medicine take-back program. Check with your pharmacy or law enforcement to find a location.  If you cannot return the medicine, check the label or package insert to see if the medicine should be thrown out in the garbage or flushed down the toilet. If you are not sure, ask your health care provider. If it is safe to put it in the trash, take the medicine out of the container. Mix the medicine with cat litter, dirt, coffee grounds, or other unwanted substance. Seal the mixture in a bag or container. Put it in the trash. NOTE:  This sheet is a summary. It may not cover all possible information. If you have questions about this medicine, talk to your doctor, pharmacist, or health care provider.  2021 Elsevier/Gold Standard (2020-06-14 16:48:07)

## 2020-12-07 NOTE — Assessment & Plan Note (Signed)
-   Former smoker, 25 pack year hx - Refer to lung cancer screening proghram

## 2020-12-07 NOTE — Progress Notes (Signed)
@Patient  ID: Diana Tate, female    DOB: 1970/10/28, 50 y.o.   MRN: [id number]  Chief Complaint  Patient presents with  . Follow-up    Reports still having fatigue with CPAP    Referring provider: Kathyrn Drown, MD  HPI: 50 year old female, former smoker (24-pack-year history).  Past medical history significant for obstructive sleep apnea esophageal reflux, history of COVID-19.  Patient of Dr. Ander Slade, last seen by pulmonary nurse practitioner on 11/21/2019 for virtual televisit.  Home sleep study on 09/09/2019 showed moderate obstructive sleep apnea, AHI 23.8/hr with SPO2 low 71%.  Maintained on auto CPAP 5 to 15 cm H2O.  12/07/2020 - Interim hx  Patient presents 1 year follow-up OSA. She is compliant with CPAP. She struggles to wake up in the morning. Feels no better since starting CPAP therapy. She is sleeping through the night. She is wondering if she could try a different mask. She started out with nasal mask with chin strap but this was not able to keep her mouth closed. She is using hybrid full face mask but continues to swallow a lot of air causing gas/bloating in the morning. Epworth 12.   Airview download 11/07/20-12/06/20:  29/30 days used; 97% > 4 hours Average usage 6 hours 31 mins Pressure 5-15cm h20 (14.6cm h20- 95%) Airleaks 0.1L/min (95%) AHI 2.0   Allergies  Allergen Reactions  . Lexapro [Escitalopram Oxalate] Nausea Only    Immunization History  Administered Date(s) Administered  . PPD Test 02/14/2014    Past Medical History:  Diagnosis Date  . GERD (gastroesophageal reflux disease)   . Hyperlipidemia   . Migraine    Classic    Tobacco History: Social History   Tobacco Use  Smoking Status Former Smoker  . Packs/day: 1.00  . Years: 24.00  . Pack years: 24.00  . Types: Cigarettes  Smokeless Tobacco Never Used  Tobacco Comment   Quit x 2 years   Counseling given: Not Answered Comment: Quit x 2 years   Outpatient Medications Prior to  Visit  Medication Sig Dispense Refill  . pantoprazole (PROTONIX) 40 MG tablet TAKE 1 TABLET BY MOUTH TWICE A DAY 180 tablet 0  . amoxicillin (AMOXIL) 500 MG capsule Take 1 capsule (500 mg total) by mouth 3 (three) times daily. 15 capsule 0  . dicyclomine (BENTYL) 20 MG tablet Take 1 tablet (20 mg total) by mouth 2 (two) times daily. 20 tablet 0   No facility-administered medications prior to visit.    Review of Systems  Review of Systems  Constitutional: Positive for fatigue.  Respiratory: Negative for cough, chest tightness and wheezing.   Cardiovascular: Negative.     Physical Exam  BP 108/70 (BP Location: Left Arm, Cuff Size: Normal)   Pulse 85   Temp 98.4 F (36.9 C) (Temporal)   Ht 5\' 6"  (1.676 m)   Wt 190 lb 3.2 oz (86.3 kg)   SpO2 96% Comment: RA  BMI 30.70 kg/m  Physical Exam Constitutional:      Appearance: Normal appearance.  HENT:     Mouth/Throat:     Mouth: Mucous membranes are moist.     Pharynx: Oropharynx is clear.  Cardiovascular:     Rate and Rhythm: Normal rate and regular rhythm.  Pulmonary:     Effort: Pulmonary effort is normal.     Breath sounds: Normal breath sounds.     Comments: CTA Skin:    General: Skin is warm and dry.  Neurological:     General:  No focal deficit present.     Mental Status: She is alert and oriented to person, place, and time. Mental status is at baseline.  Psychiatric:        Mood and Affect: Mood normal.        Behavior: Behavior normal.        Thought Content: Thought content normal.        Judgment: Judgment normal.      Lab Results:  CBC    Component Value Date/Time   WBC 11.1 (H) 01/11/2020 1649   WBC 14.8 (H) 01/09/2020 1123   RBC 4.28 01/11/2020 1649   RBC 4.67 01/09/2020 1123   HGB 11.9 01/11/2020 1649   HGB 12.3 12/29/2012 1529   HCT 36.4 01/11/2020 1649   HCT 37.7 12/29/2012 1529   PLT 265 01/11/2020 1649   MCV 85 01/11/2020 1649   MCV 84.3 12/29/2012 1529   MCH 27.8 01/11/2020 1649   MCH  27.4 01/09/2020 1123   MCHC 32.7 01/11/2020 1649   MCHC 30.8 01/09/2020 1123   RDW 13.5 01/11/2020 1649   RDW 14.2 12/29/2012 1529   LYMPHSABS 2.4 01/11/2020 1649   LYMPHSABS 3.1 12/29/2012 1529   MONOABS 0.8 12/01/2017 1416   MONOABS 0.9 12/29/2012 1529   EOSABS 0.2 01/11/2020 1649   BASOSABS 0.0 01/11/2020 1649   BASOSABS 0.0 12/29/2012 1529    BMET    Component Value Date/Time   NA 138 01/11/2020 1649   NA 137 12/29/2012 1529   K 4.5 01/11/2020 1649   K 3.8 12/29/2012 1529   CL 100 01/11/2020 1649   CL 104 12/29/2012 1529   CO2 25 01/11/2020 1649   CO2 24 12/29/2012 1529   GLUCOSE 68 01/11/2020 1649   GLUCOSE 107 (H) 01/09/2020 1123   GLUCOSE 123 (H) 12/29/2012 1529   BUN 11 01/11/2020 1649   BUN 13.2 12/29/2012 1529   CREATININE 0.65 01/11/2020 1649   CREATININE 0.8 12/29/2012 1529   CALCIUM 9.4 01/11/2020 1649   CALCIUM 9.6 12/29/2012 1529   GFRNONAA 105 01/11/2020 1649   GFRAA 121 01/11/2020 1649    BNP No results found for: BNP  ProBNP No results found for: PROBNP  Imaging: No results found.   Assessment & Plan:   Obstructive sleep apnea - Residual daytime fatigue despite CPAP compliance. Epworth 12/24. Current CPAP pressure 5 to 15 cm H2O, residual AHI 2.0. No changes to settings. Placing order for full face airfit F20 CPAP mask.  Consider trial Provigil to help with excessive sleepiness in patient with sleep apnea. Will discuss with Dr. Ander Slade. FU in 6 months.   Former smoker - Former smoker, 25 pack year hx - Refer to lung cancer screening proghram    Martyn Ehrich, NP 12/07/2020

## 2020-12-07 NOTE — Assessment & Plan Note (Addendum)
-   Residual daytime fatigue despite CPAP compliance. Epworth 12/24. Current CPAP pressure 5 to 15 cm H2O, residual AHI 2.0. No changes to settings. Placing order for full face airfit F20 CPAP mask.  Consider trial Provigil to help with excessive sleepiness in patient with sleep apnea. Will discuss with Dr. Ander Slade. FU in 6 months.

## 2020-12-17 ENCOUNTER — Other Ambulatory Visit: Payer: Self-pay | Admitting: *Deleted

## 2020-12-17 DIAGNOSIS — Z87891 Personal history of nicotine dependence: Secondary | ICD-10-CM

## 2021-01-24 ENCOUNTER — Other Ambulatory Visit: Payer: Self-pay

## 2021-01-24 ENCOUNTER — Ambulatory Visit: Payer: BC Managed Care – PPO | Admitting: Nurse Practitioner

## 2021-01-24 VITALS — BP 109/77 | HR 66 | Temp 98.2°F | Ht 66.0 in | Wt 181.0 lb

## 2021-01-24 DIAGNOSIS — R5383 Other fatigue: Secondary | ICD-10-CM | POA: Diagnosis not present

## 2021-01-24 NOTE — Progress Notes (Signed)
   Subjective:    Patient ID: Diana Tate, female    DOB: 09/06/1970, 50 y.o.   MRN: [id number]  HPI   Request to have labs checked , is currently on a diet and is feeling tired and weak. Has been doing the [employer] plan with great results.  The only issue is she stays hungry all the time.  Most of her fluid intake is water approximately 80 to 90 ounces per day.  Is taking B12 and vitamin D supplements.  Wearing her CPAP for her sleep apnea.  Denies any depression or anxiety symptoms.  States she feels generalized fatigue all the time.  No chest pain/ischemic type pain shortness of breath or edema.  States she has had some mild issues with her thyroid blood work in the past.        Objective:   Physical Exam NAD.  Alert, oriented.  Calm cheerful affect.  Making good eye contact.  Dressed appropriately.  Lungs clear.  Heart regular rate rhythm.  Abdomen soft nondistended nontender.  Thyroid nontender to palpation, no mass or goiter noted. Today's Vitals   01/24/21 1421  BP: 109/77  Pulse: 66  Temp: 98.2 F (36.8 C)  SpO2: 97%  Weight: 181 lb (82.1 kg)  Height: 5\' 6"  (1.676 m)   Body mass index is 29.21 kg/m.        Assessment & Plan:  Fatigue, unspecified type - Plan: CBC with Differential/Platelet, Comprehensive metabolic panel, TSH + free T4, Lipid panel  Routine screening labs including screening for fatigue. Although patient has done well with her weight loss question whether this program can be done long-term especially with her increase in appetite.  Recommend recheck if she wishes to discuss other options for weight loss.  Further follow-up based on lab results.

## 2021-01-25 ENCOUNTER — Encounter: Payer: Self-pay | Admitting: Nurse Practitioner

## 2021-01-26 LAB — LIPID PANEL
Chol/HDL Ratio: 3.6 ratio (ref 0.0–4.4)
Cholesterol, Total: 193 mg/dL (ref 100–199)
HDL: 54 mg/dL (ref >39)
LDL Chol Calc (NIH): 124 mg/dL — ABNORMAL HIGH (ref 0–99)
Triglycerides: 80 mg/dL (ref 0–149)
VLDL Cholesterol Cal: 15 mg/dL (ref 5–40)

## 2021-01-26 LAB — CBC WITH DIFFERENTIAL/PLATELET
Basophils Absolute: 0 10*3/uL (ref 0.0–0.2)
Basos: 0 %
EOS (ABSOLUTE): 0.3 10*3/uL (ref 0.0–0.4)
Eos: 4 %
Hematocrit: 41.3 % (ref 34.0–46.6)
Hemoglobin: 13.2 g/dL (ref 11.1–15.9)
Immature Grans (Abs): 0 10*3/uL (ref 0.0–0.1)
Immature Granulocytes: 0 %
Lymphocytes Absolute: 1.7 10*3/uL (ref 0.7–3.1)
Lymphs: 21 %
MCH: 27.8 pg (ref 26.6–33.0)
MCHC: 32 g/dL (ref 31.5–35.7)
MCV: 87 fL (ref 79–97)
Monocytes Absolute: 0.6 10*3/uL (ref 0.1–0.9)
Monocytes: 7 %
Neutrophils Absolute: 5.6 10*3/uL (ref 1.4–7.0)
Neutrophils: 68 %
Platelets: 256 10*3/uL (ref 150–450)
RBC: 4.75 x10E6/uL (ref 3.77–5.28)
RDW: 13.2 % (ref 11.7–15.4)
WBC: 8.3 10*3/uL (ref 3.4–10.8)

## 2021-01-26 LAB — TSH+FREE T4
Free T4: 1.12 ng/dL (ref 0.82–1.77)
TSH: 1.43 u[IU]/mL (ref 0.450–4.500)

## 2021-01-26 LAB — COMPREHENSIVE METABOLIC PANEL
ALT: 16 IU/L (ref 0–32)
AST: 10 IU/L (ref 0–40)
Albumin/Globulin Ratio: 1.8 (ref 1.2–2.2)
Albumin: 4.5 g/dL (ref 3.8–4.8)
Alkaline Phosphatase: 85 IU/L (ref 44–121)
BUN/Creatinine Ratio: 22 (ref 9–23)
BUN: 21 mg/dL (ref 6–24)
Bilirubin Total: 0.4 mg/dL (ref 0.0–1.2)
CO2: 22 mmol/L (ref 20–29)
Calcium: 10 mg/dL (ref 8.7–10.2)
Chloride: 103 mmol/L (ref 96–106)
Creatinine, Ser: 0.96 mg/dL (ref 0.57–1.00)
Globulin, Total: 2.5 g/dL (ref 1.5–4.5)
Glucose: 93 mg/dL (ref 65–99)
Potassium: 5.1 mmol/L (ref 3.5–5.2)
Sodium: 139 mmol/L (ref 134–144)
Total Protein: 7 g/dL (ref 6.0–8.5)
eGFR: 72 mL/min/{1.73_m2} (ref >59)

## 2021-02-01 ENCOUNTER — Other Ambulatory Visit: Payer: Self-pay | Admitting: Obstetrics and Gynecology

## 2021-02-01 ENCOUNTER — Other Ambulatory Visit: Payer: Self-pay

## 2021-02-01 ENCOUNTER — Ambulatory Visit
Admission: RE | Admit: 2021-02-01 | Discharge: 2021-02-01 | Disposition: A | Discharge status: Home / Self Care | Payer: BC Managed Care – PPO | Source: Ambulatory Visit | Attending: Obstetrics and Gynecology | Admitting: Obstetrics and Gynecology

## 2021-02-01 DIAGNOSIS — R928 Other abnormal and inconclusive findings on diagnostic imaging of breast: Secondary | ICD-10-CM

## 2021-02-01 DIAGNOSIS — R921 Mammographic calcification found on diagnostic imaging of breast: Secondary | ICD-10-CM

## 2021-02-01 IMAGING — MG MM DIGITAL DIAGNOSTIC UNILAT*R* W/ TOMO W/ CAD
6 series · 6 of 14 positions shown · non-contrast
Comparison: Previous exam(s).

CLINICAL DATA: Short-term follow-up for probably benign right
breast calcifications, which were initially assessed with diagnostic
imaging on [DATE].

EXAM:
DIGITAL DIAGNOSTIC UNILATERAL RIGHT MAMMOGRAM WITH TOMOSYNTHESIS AND
CAD; ULTRASOUND RIGHT BREAST LIMITED
TECHNIQUE: Right digital diagnostic mammography and breast tomosynthesis was
performed. The images were evaluated with computer-aided detection.;
Targeted ultrasound examination of the right breast was performed

[R ML]
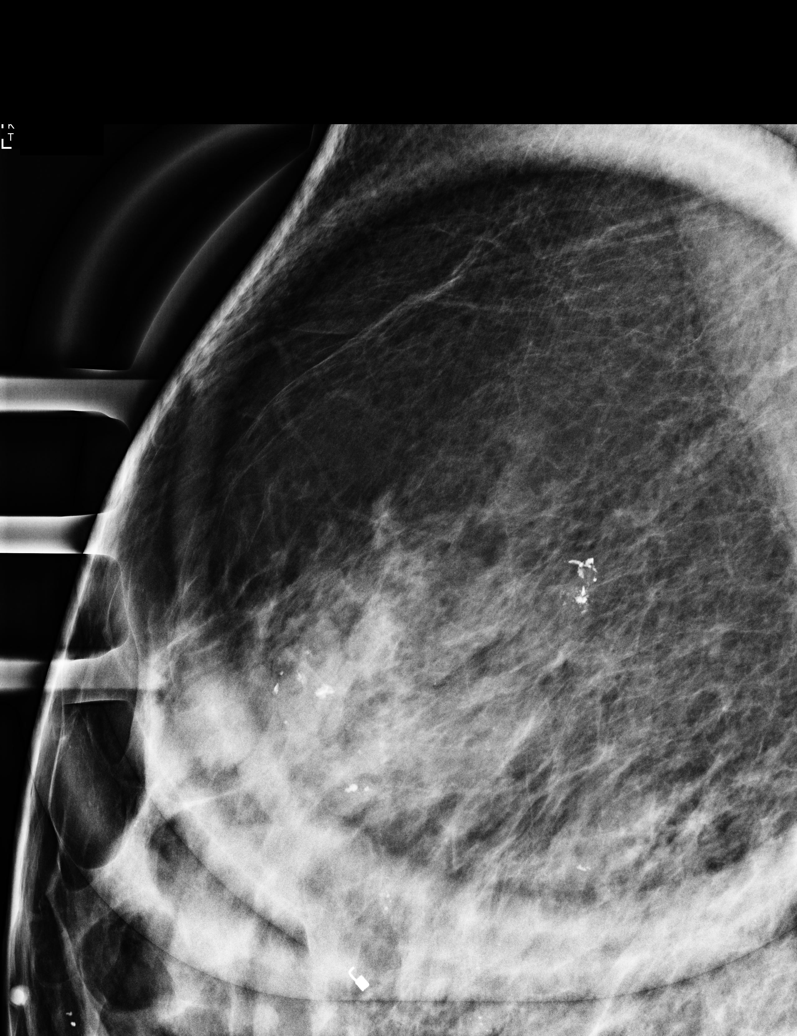

[R CC]
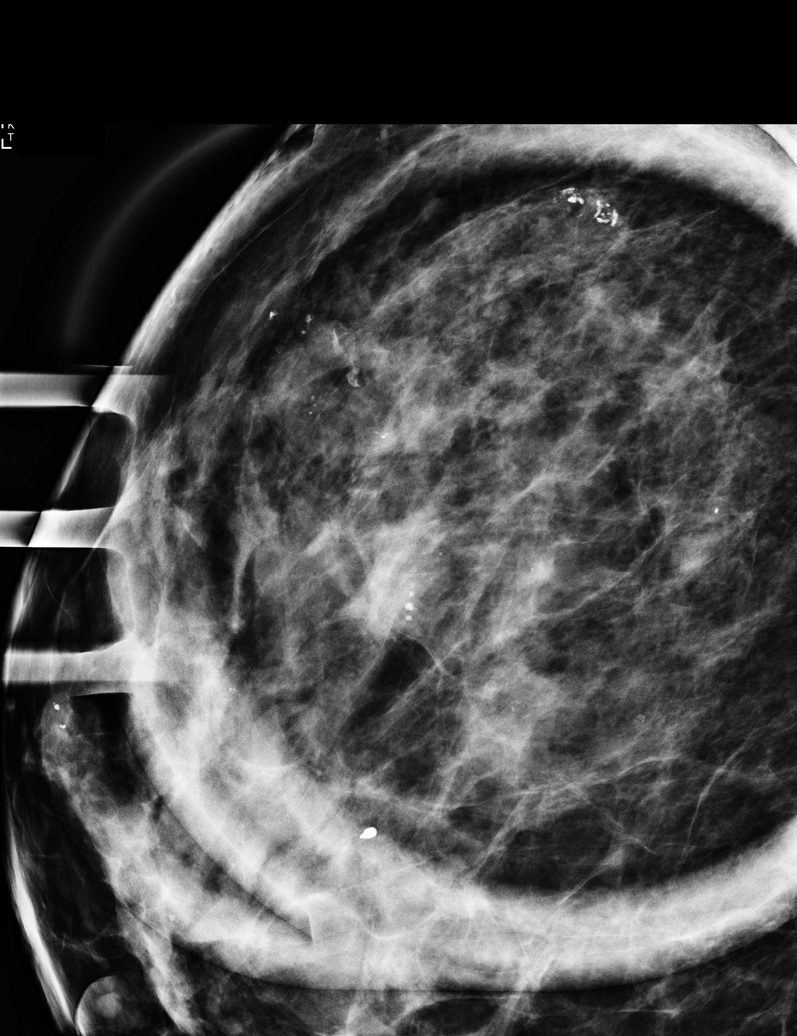

[R CC synth-2D]
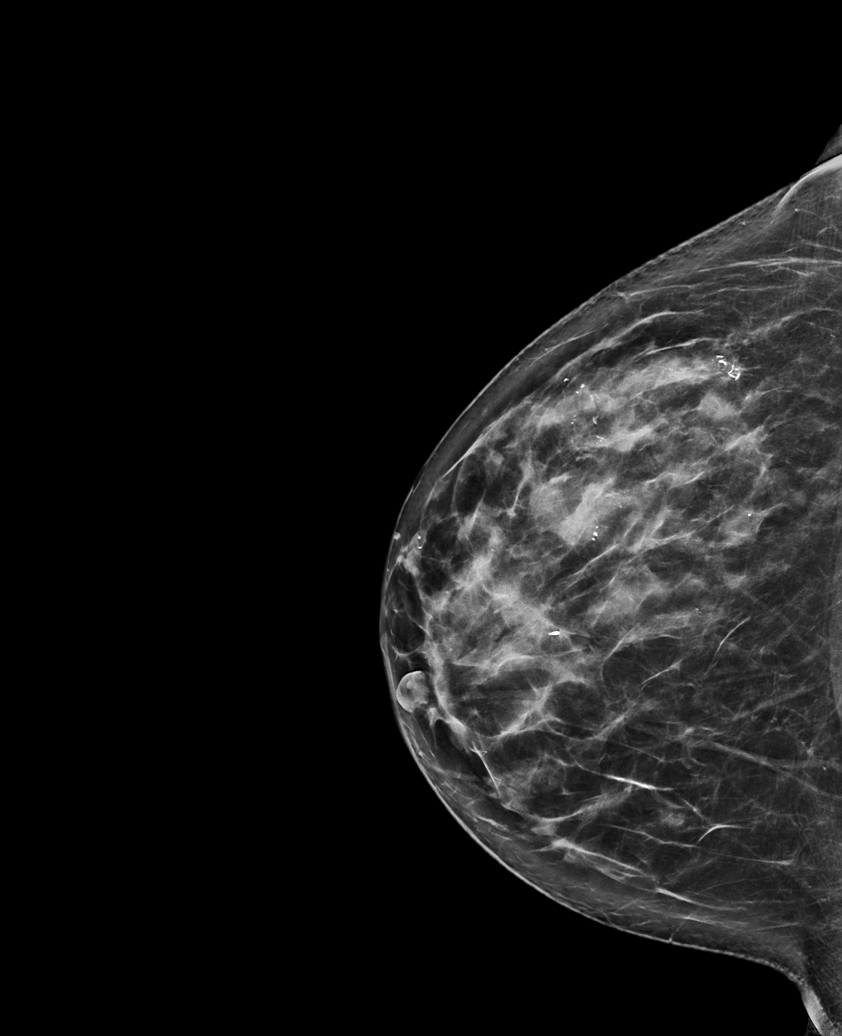

[R MLO synth-2D]
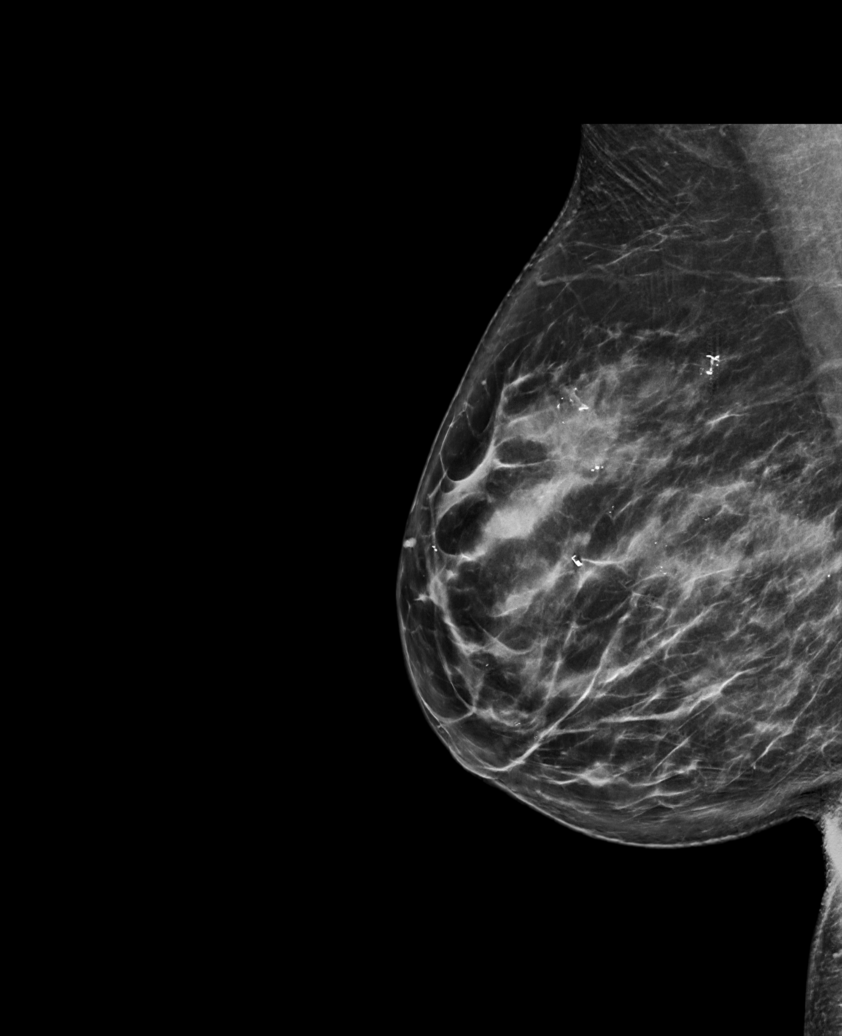

[R CC tomo · tomo slice 42/83.0]
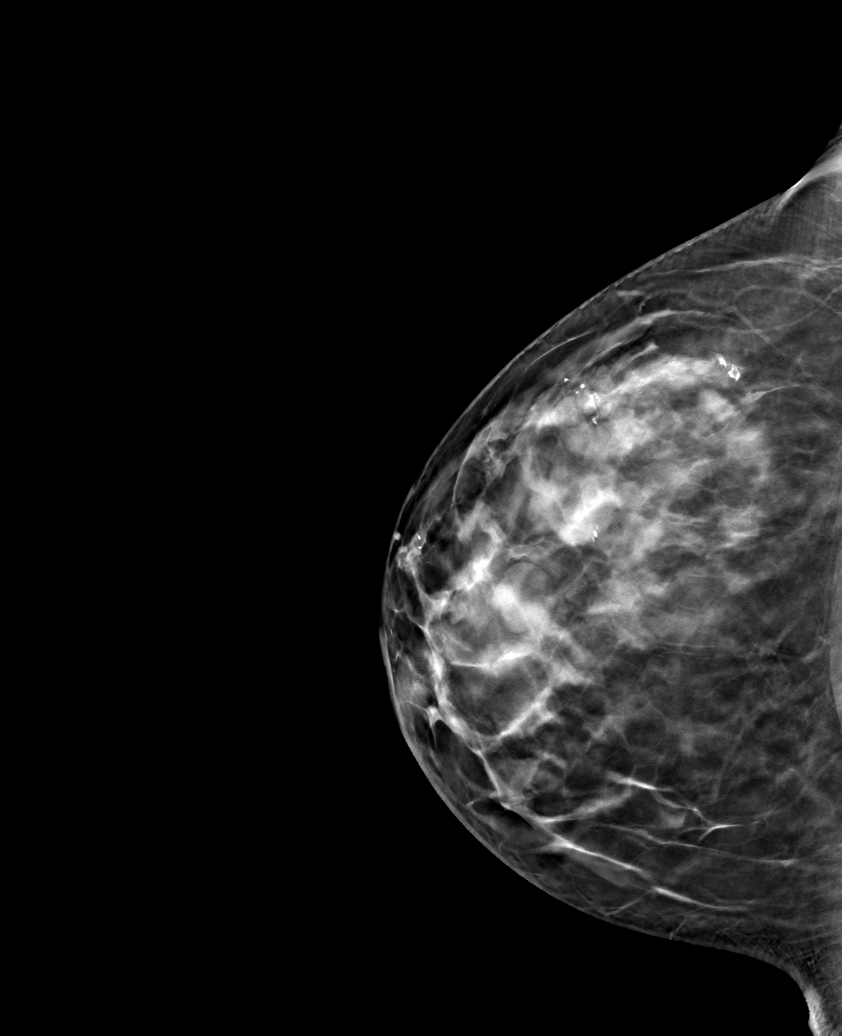

[R MLO tomo · tomo slice 41/82.0]
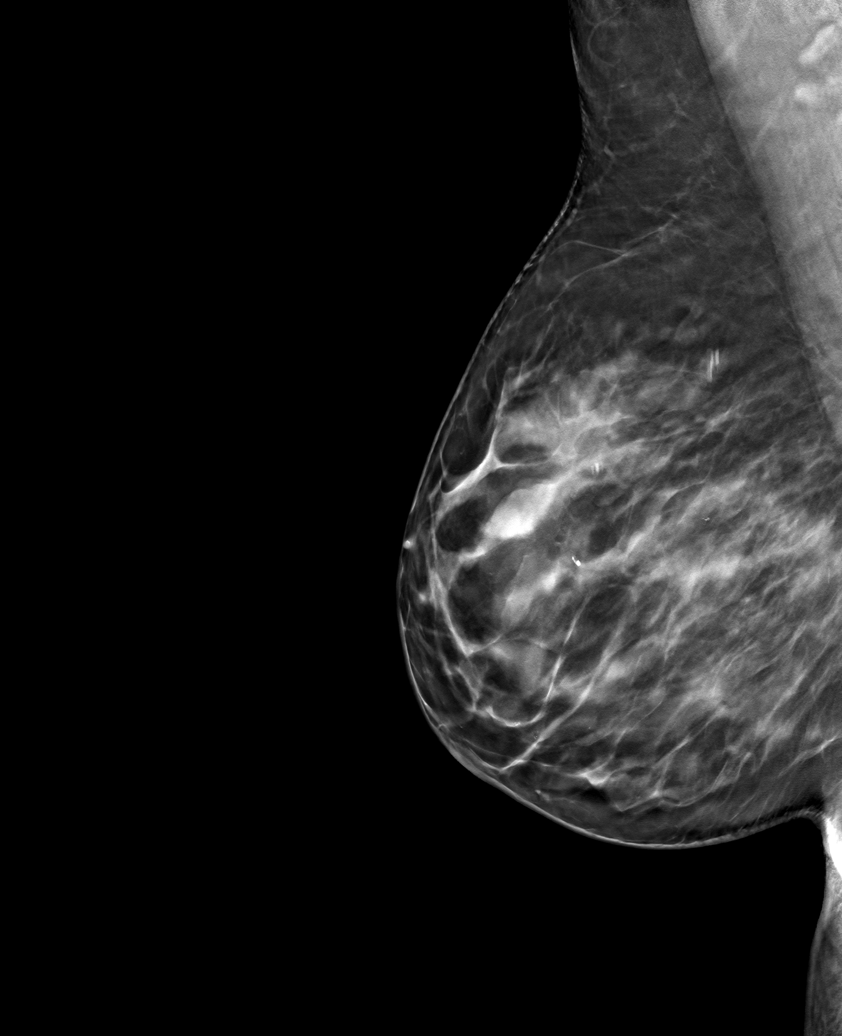

[6 of 14 positions shown; findings below may reference images not displayed]

ACR Breast Density Category c: The breast tissue is heterogeneously
dense, which may obscure small masses.
FINDINGS: The calcifications noted in the posterior, upper outer right breast
on the prior exam has shown a mild interval decrease in number and
extent. These now spanned 8 mm in long axis. Anterior to these are
several coarse and small round calcifications, which have increased
in number since the previous mammogram. Inferior and medial to these
are stable benign coarse calcifications.

There are partly obscured round oval masses in the upper outer right
breast which appears stable from the prior study.

Targeted right breast ultrasound is performed, showing multiple
cysts in the upper outer quadrant. There is a group of cysts at 10
o'clock, 9 cm the nipple, measuring approximately 2.0 x 1.1 x
cm. There are several echogenic foci associated with the cyst walls
and several thin septa consistent with calcifications.
IMPRESSION: 1. Probably benign right breast calcifications, some decreased and
some increased compared to the prior exam, associated with multiple
upper outer quadrant cysts. This is most likely all due to
fibrocystic change. Additional short-term follow-up recommended.

RECOMMENDATION:
1. Diagnostic mammography with possible right breast ultrasound in 6
months.

I have discussed the findings and recommendations with the patient.
If applicable, a reminder letter will be sent to the patient
regarding the next appointment.

BI-RADS CATEGORY  3: Probably benign.

## 2021-02-01 IMAGING — US US BREAST*R* LIMITED INC AXILLA
1 series · 6 of 6 positions shown · non-contrast
Comparison: Previous exam(s).

CLINICAL DATA: Short-term follow-up for probably benign right
breast calcifications, which were initially assessed with diagnostic
imaging on [DATE].

EXAM:
DIGITAL DIAGNOSTIC UNILATERAL RIGHT MAMMOGRAM WITH TOMOSYNTHESIS AND
CAD; ULTRASOUND RIGHT BREAST LIMITED
TECHNIQUE: Right digital diagnostic mammography and breast tomosynthesis was
performed. The images were evaluated with computer-aided detection.;
Targeted ultrasound examination of the right breast was performed

[Series 1: us breast*right* limited inc axilla · 0.07mm/px · 6 of 6 slices shown]
[im 1/6]
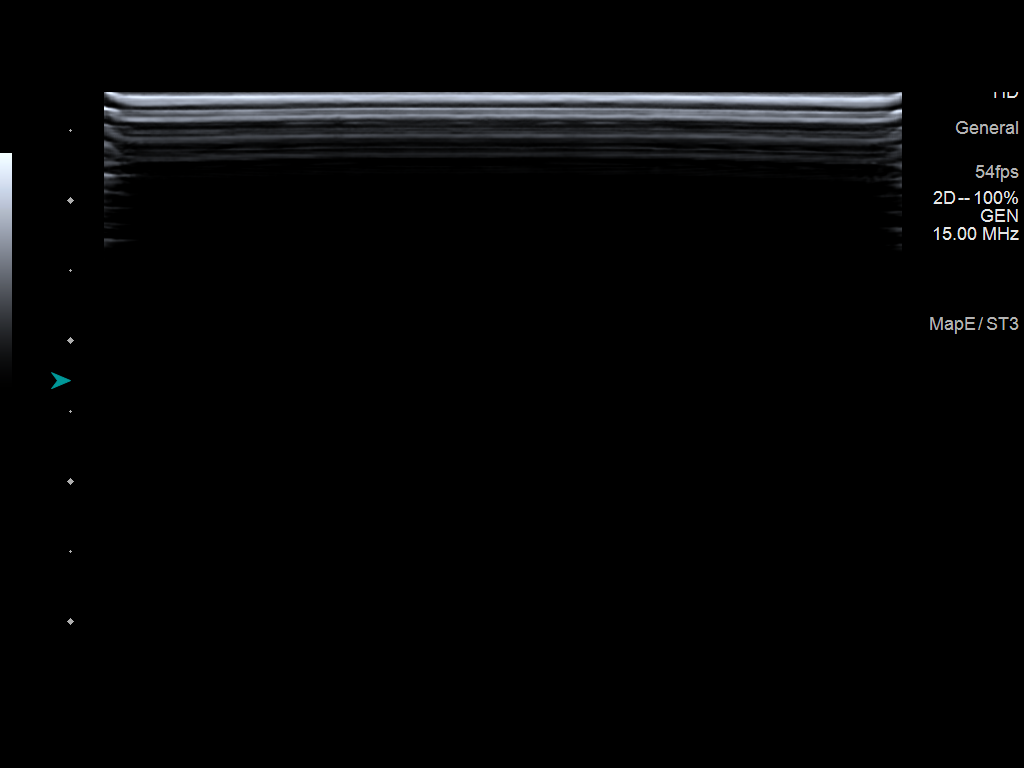
[im 2/6]
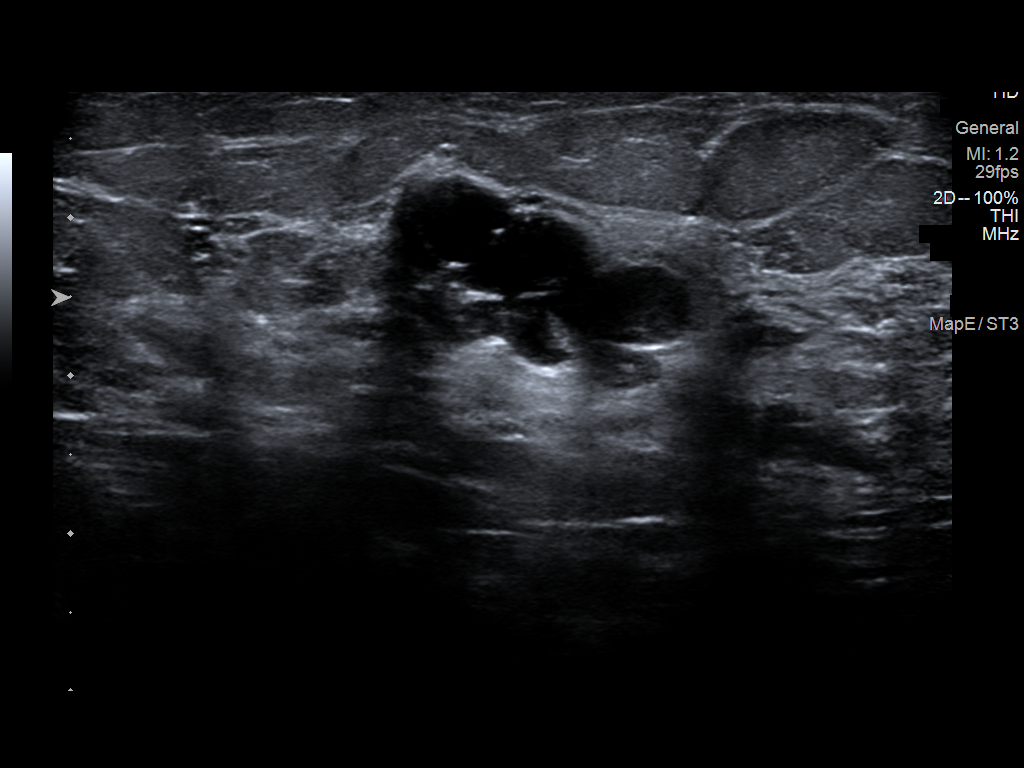
[im 3/6]
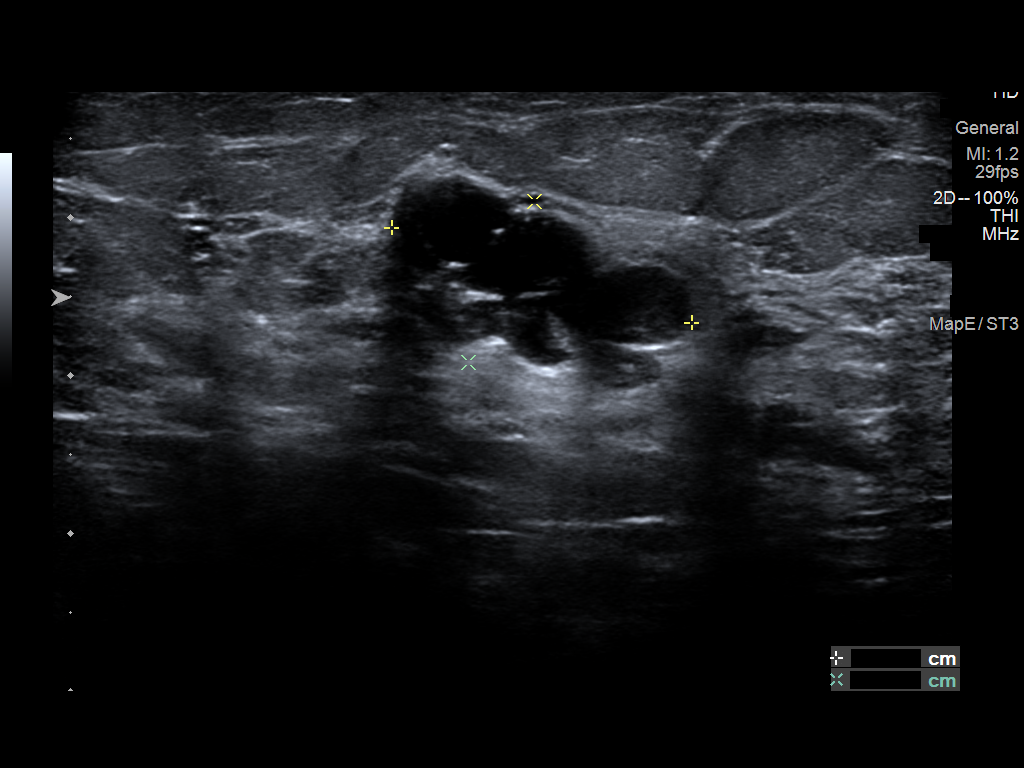
[im 4/6]
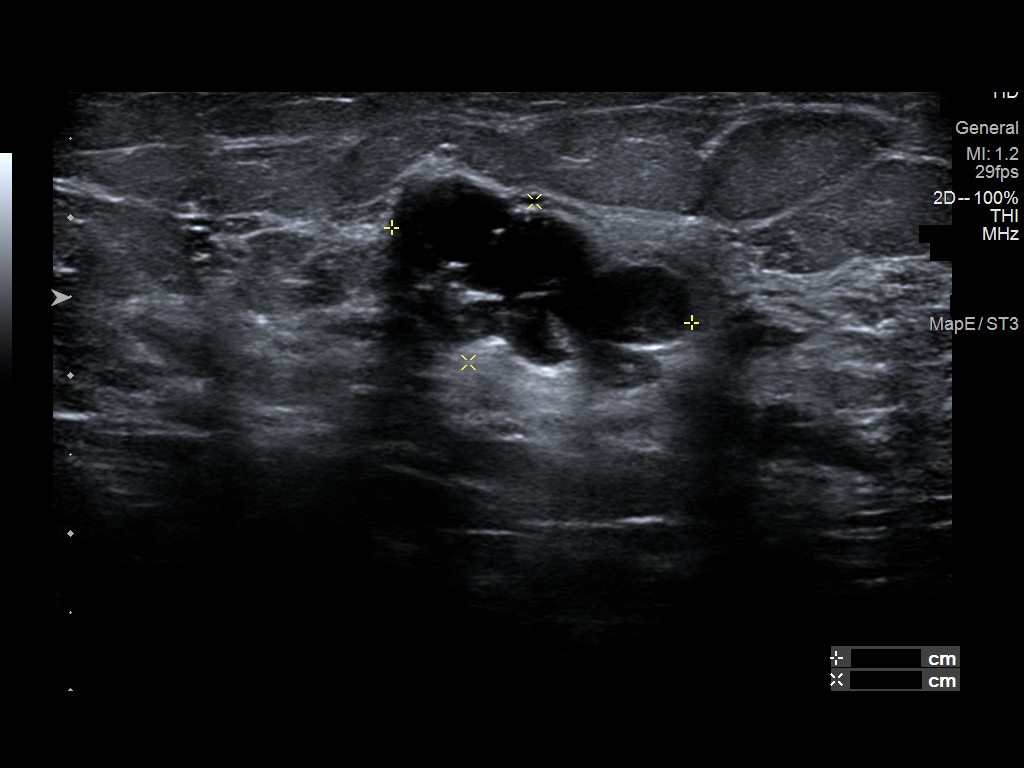
[im 5/6]
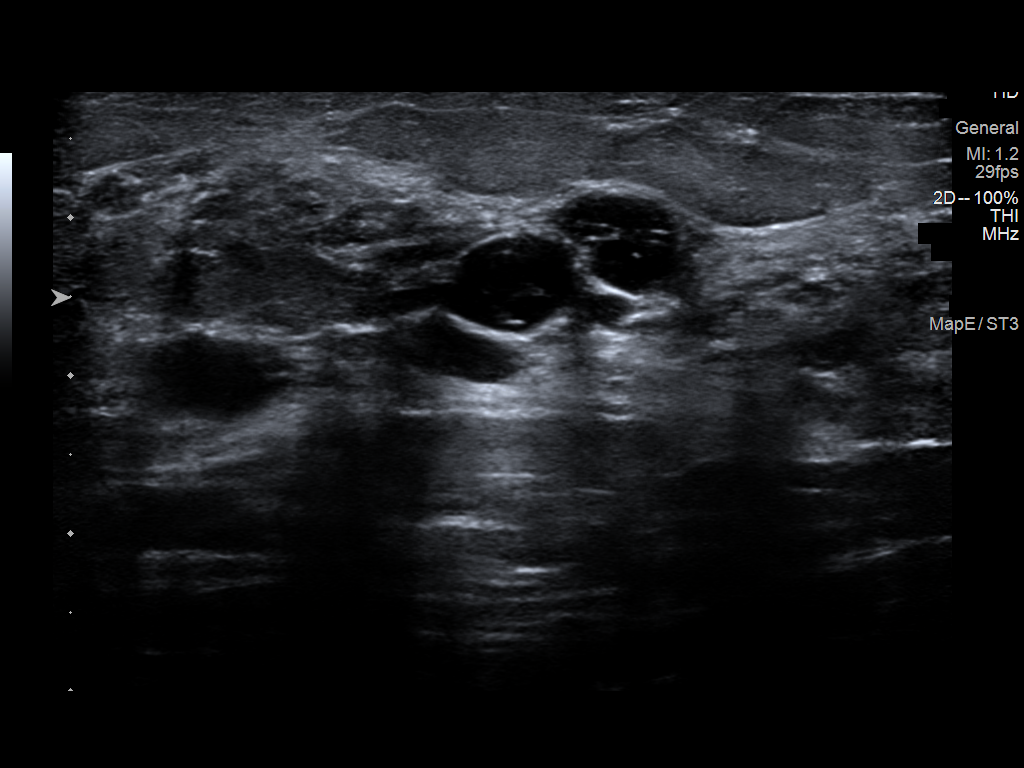
[im 6/6]
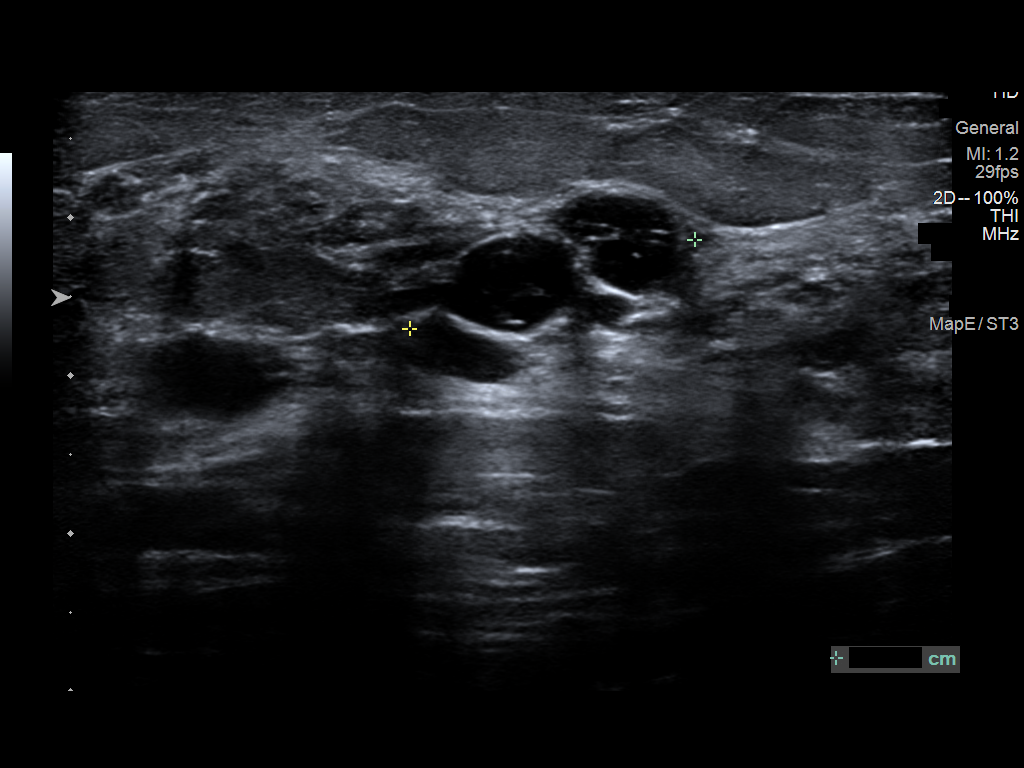

[6 of 6 positions shown; findings below may reference images not displayed]

ACR Breast Density Category c: The breast tissue is heterogeneously
dense, which may obscure small masses.
FINDINGS: The calcifications noted in the posterior, upper outer right breast
on the prior exam has shown a mild interval decrease in number and
extent. These now spanned 8 mm in long axis. Anterior to these are
several coarse and small round calcifications, which have increased
in number since the previous mammogram. Inferior and medial to these
are stable benign coarse calcifications.

There are partly obscured round oval masses in the upper outer right
breast which appears stable from the prior study.

Targeted right breast ultrasound is performed, showing multiple
cysts in the upper outer quadrant. There is a group of cysts at 10
o'clock, 9 cm the nipple, measuring approximately 2.0 x 1.1 x
cm. There are several echogenic foci associated with the cyst walls
and several thin septa consistent with calcifications.
IMPRESSION: 1. Probably benign right breast calcifications, some decreased and
some increased compared to the prior exam, associated with multiple
upper outer quadrant cysts. This is most likely all due to
fibrocystic change. Additional short-term follow-up recommended.

RECOMMENDATION:
1. Diagnostic mammography with possible right breast ultrasound in 6
months.

I have discussed the findings and recommendations with the patient.
If applicable, a reminder letter will be sent to the patient
regarding the next appointment.

BI-RADS CATEGORY  3: Probably benign.

## 2021-02-04 ENCOUNTER — Ambulatory Visit (INDEPENDENT_AMBULATORY_CARE_PROVIDER_SITE_OTHER): Payer: BC Managed Care – PPO | Admitting: Acute Care

## 2021-02-04 ENCOUNTER — Ambulatory Visit
Admission: RE | Admit: 2021-02-04 | Discharge: 2021-02-04 | Disposition: A | Discharge status: Home / Self Care | Payer: BC Managed Care – PPO | Source: Ambulatory Visit | Attending: Acute Care | Admitting: Acute Care

## 2021-02-04 ENCOUNTER — Other Ambulatory Visit: Payer: Self-pay

## 2021-02-04 ENCOUNTER — Encounter: Payer: Self-pay | Admitting: Acute Care

## 2021-02-04 VITALS — BP 130/70 | HR 70 | Temp 98.0°F | Ht 66.0 in | Wt 179.4 lb

## 2021-02-04 DIAGNOSIS — Z87891 Personal history of nicotine dependence: Secondary | ICD-10-CM

## 2021-02-04 IMAGING — CT CT CHEST LUNG CANCER SCREENING LOW DOSE W/O CM
2 of 5 series · 15 of 40 positions shown, 18 images · non-contrast
Comparison: None.

CLINICAL DATA: 49-year-old female with 20 pack-year history of
smoking. Lung cancer screening.

EXAM:
CT CHEST WITHOUT CONTRAST LOW-DOSE FOR LUNG CANCER SCREENING
TECHNIQUE: Multidetector CT imaging of the chest was performed following the
standard protocol without IV contrast.

[Series 4: lung 1.00 br44 cor · coronal · 0.63mm/px · 3 of 388 slices shown]
[im 78/388  lung]
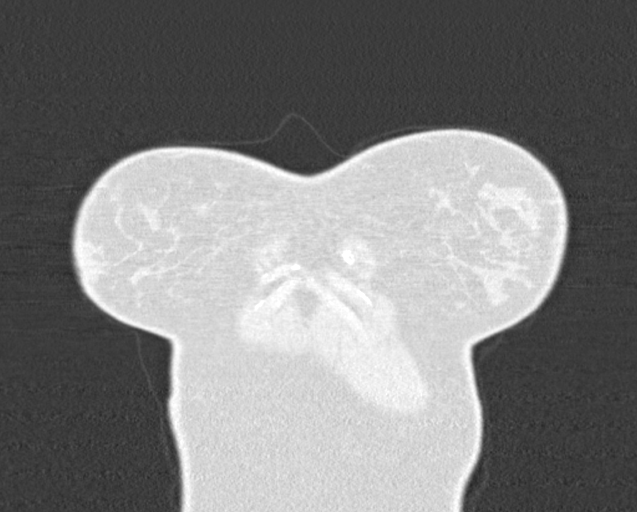
[im 155/388  lung]
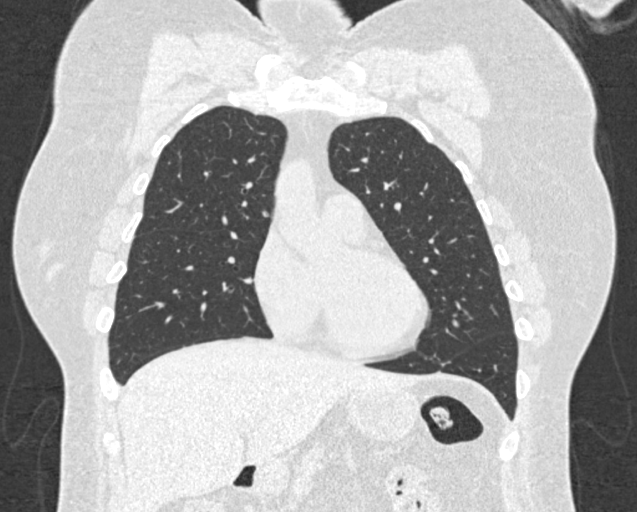
[im 233/388  lung]
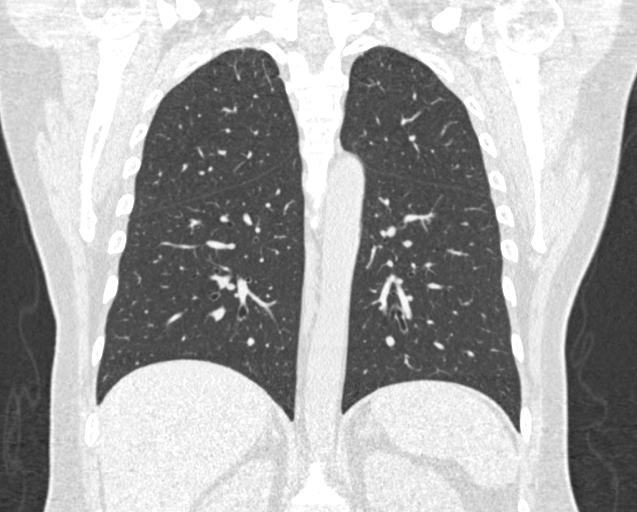

[Series 9: lung 1.00 br60 axial · axial · 0.78mm/px · z∈[-1035,-745]mm · 12 of 320 slices shown, 15 images]
[im 15/320  mediastinal]
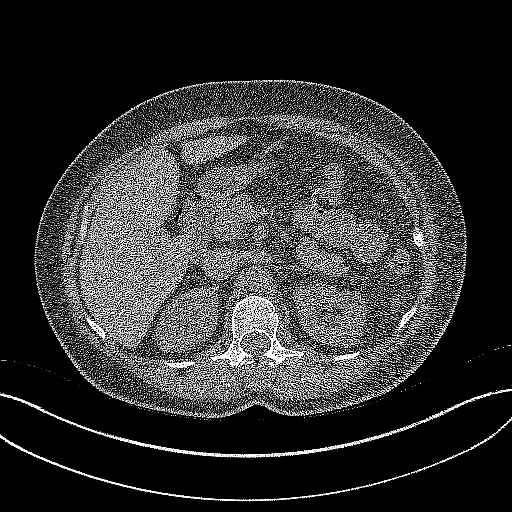
[im 15/320  lung]
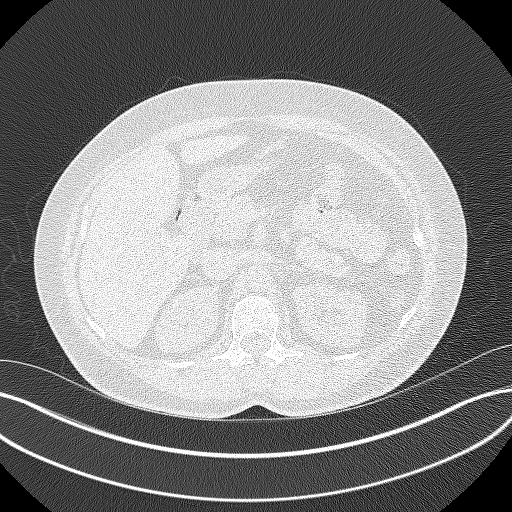
[im 44/320  lung]
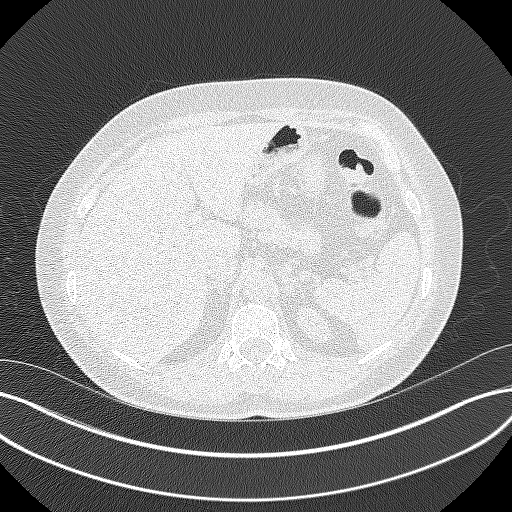
[im 73/320  lung]
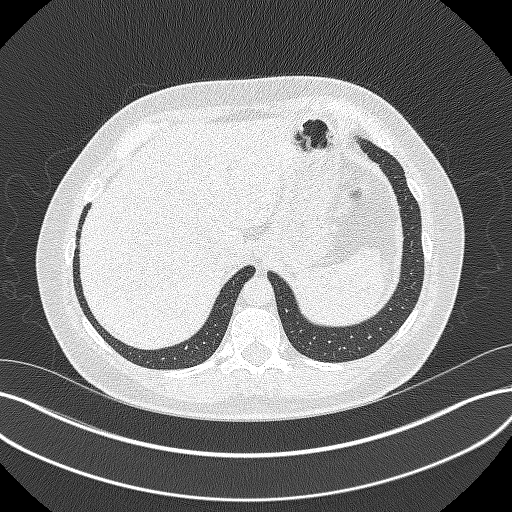
[im 102/320  lung]
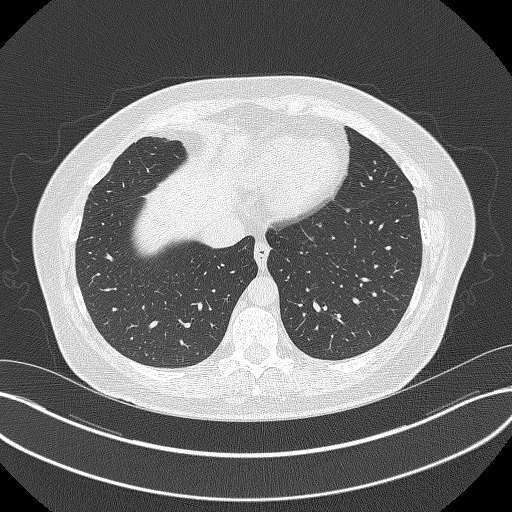
[im 117/320  mediastinal]
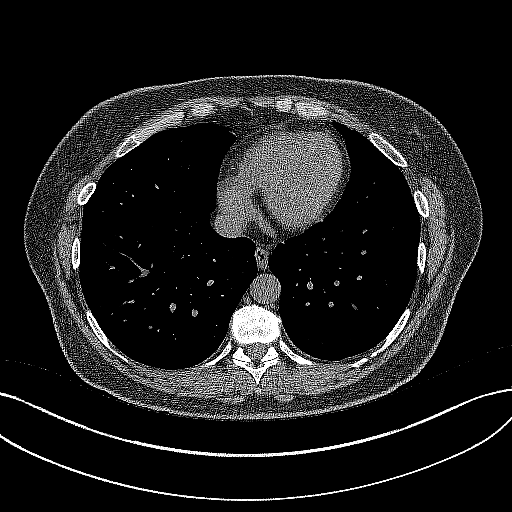
[im 117/320  lung]
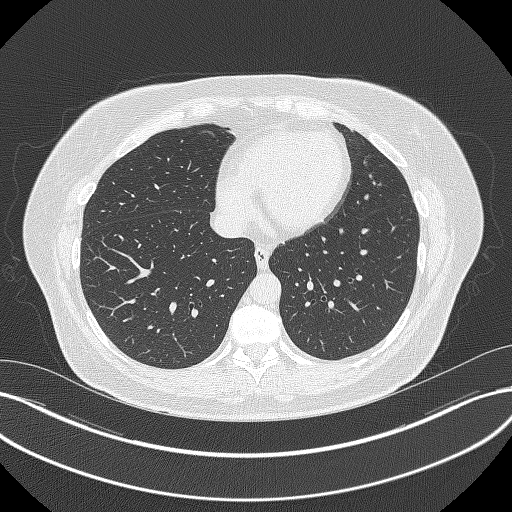
[im 146/320  lung]
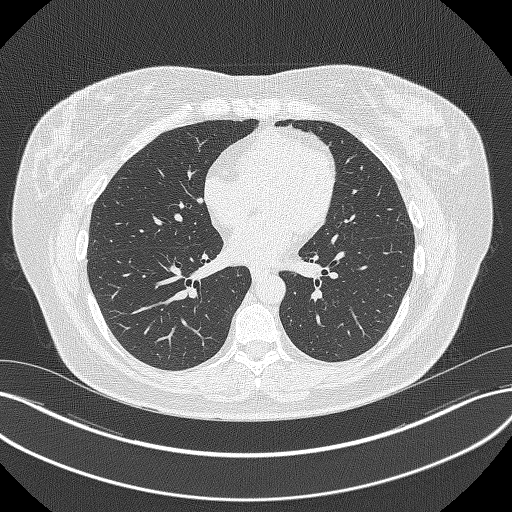
[im 175/320  lung]
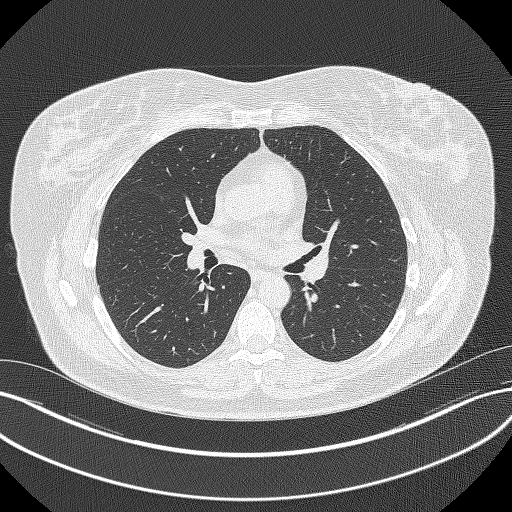
[im 204/320  lung]
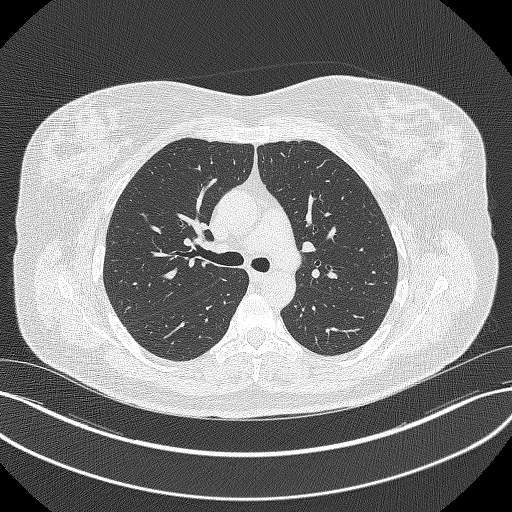
[im 218/320  mediastinal]
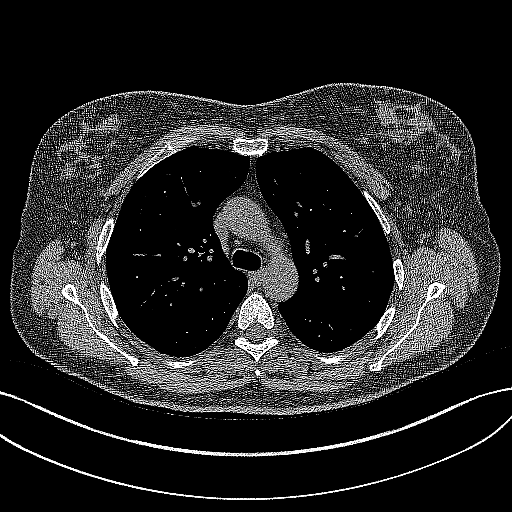
[im 218/320  lung]
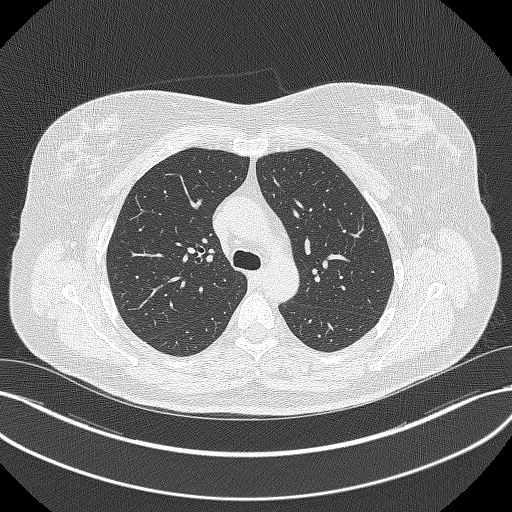
[im 247/320  lung]
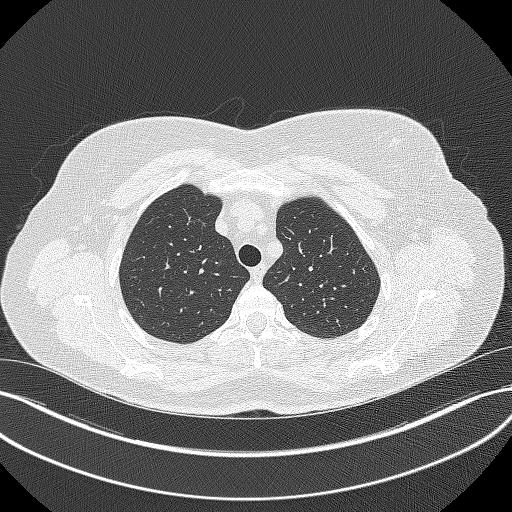
[im 276/320  lung]
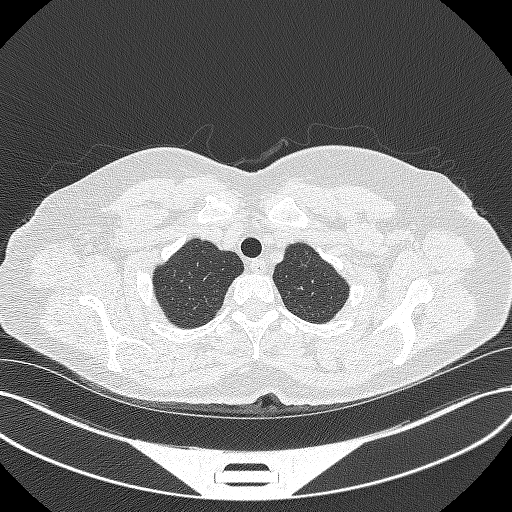
[im 305/320  lung]
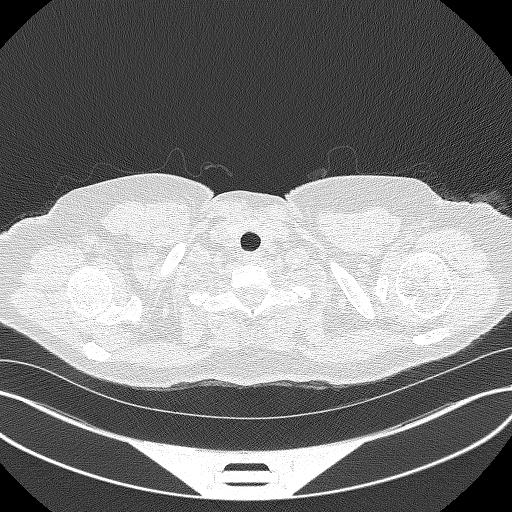

[15 of 40 positions shown; findings below may reference images not displayed]

FINDINGS: Cardiovascular: The heart size is normal. No substantial pericardial
effusion. No thoracic aortic aneurysm.

Mediastinum/Nodes: No mediastinal lymphadenopathy. No evidence for
gross hilar lymphadenopathy although assessment is limited by the
lack of intravenous contrast on today's study. The esophagus has
normal imaging features. There is no axillary lymphadenopathy.

Lungs/Pleura: Centrilobular emphsyema noted. Bilateral tiny
pulmonary nodules identified measuring up to maximum volume derived
equivalent diameter of 4.2 mm. No overtly suspicious nodule or mass.
No focal airspace consolidation. No pleural effusion.

Upper Abdomen: Unremarkable.

Musculoskeletal: No worrisome lytic or sclerotic osseous
abnormality.
IMPRESSION: 1. Lung-RADS 2, benign appearance or behavior. Continue annual
screening with low-dose chest CT without contrast in 12 months.
2. Emphysema ([C5]-[C5]).

## 2021-02-04 NOTE — Progress Notes (Signed)
Shared Decision Making Visit Lung Cancer Screening Program [phone])   Eligibility: Age 50 y.o. Pack Years Smoking History Calculation 28 pack year smoking history (# packs/per year x # years smoked) Recent History of coughing up blood  no Unexplained weight loss? no ( >Than 15 pounds within the last 6 months ) Prior History Lung / other cancer no (Diagnosis within the last 5 years already requiring surveillance chest CT Scans). Smoking Status Former Smoker Former Smokers: Years since quit: 5 years  Quit Date: 2017  Visit Components: Discussion included one or more decision making aids. yes Discussion included risk/benefits of screening. yes Discussion included potential follow up diagnostic testing for abnormal scans. yes Discussion included meaning and risk of over diagnosis. yes Discussion included meaning and risk of False Positives. yes Discussion included meaning of total radiation exposure. yes  Counseling Included: Importance of adherence to annual lung cancer LDCT screening. yes Impact of comorbidities on ability to participate in the program. yes Ability and willingness to under diagnostic treatment. yes  Smoking Cessation Counseling: Current Smokers:  Discussed importance of smoking cessation. yes Information about tobacco cessation classes and interventions provided to patient. yes Patient provided with "ticket" for LDCT Scan. yes Symptomatic Patient. no  Counseling NA Diagnosis Code: Tobacco Use Z72.0 Asymptomatic Patient yes  Counseling (Intermediate counseling: > three minutes counseling) [id number] Former Smokers:  Discussed the importance of maintaining cigarette abstinence. yes Diagnosis Code: Personal History of Nicotine Dependence. F81.829 Information about tobacco cessation classes and interventions provided to patient. Yes Patient provided with "ticket" for LDCT Scan. yes Written Order for Lung Cancer Screening with LDCT placed in Epic. Yes (CT Chest Lung  Cancer Screening Low Dose W/O CM) [id number] Z12.2-Screening of respiratory organs Z87.891-Personal history of nicotine dependence  I spent 25 minutes of face to face time with Ms. Depinto discussing the risks and benefits of lung cancer screening. We viewed a power point together that explained in detail the above noted topics. We took the time to pause the power point at intervals to allow for questions to be asked and answered to ensure understanding. We discussed that she had taken the single most powerful action possible to decrease her risk of developing lung cancer when she quit smoking. I counseled her to remain smoke free, and to contact me if she ever had the desire to smoke again so that I can provide resources and tools to help support the effort to remain smoke free. We discussed the time and location of the scan, and that either  Doroteo Glassman RN or I will call with the results within  24-48 hours of receiving them. She has my card and contact information in the event she needs to speak with me, in addition to a copy of the power point we reviewed as a resource. She verbalized understanding of all of the above and had no further questions upon leaving the office.     I explained to the patient that there has been a high incidence of coronary artery disease noted on these exams. I explained that this is a non-gated exam therefore degree or severity cannot be determined. This patient is not on statin therapy. I have asked the patient to follow-up with their PCP regarding any incidental finding of coronary artery disease and management with diet or medication as they feel is clinically indicated. The patient verbalized understanding of the above and had no further questions.  Pt. Has quit smoking cigarettes, but she does vae about 6 times daily. We  have discussed reduce to quit. I have provided her with the number of [facility] for smoking cessation hypnosis.     Magdalen Spatz,  NP 02/04/2021

## 2021-02-04 NOTE — Patient Instructions (Signed)
Thank you for participating in the [facility]. It was our pleasure to meet you today. We will call you with the results of your scan within the next few days. Your scan will be assigned a Lung RADS category score by the physicians reading the scans.  This Lung RADS score determines follow up scanning.  See below for description of categories, and follow up screening recommendations. We will be in touch to schedule your follow up screening annually or based on recommendations of our providers. We will fax a copy of your scan results to your Primary Care Physician, or the physician who referred you to the program, to ensure they have the results. Please call the office if you have any questions or concerns regarding your scanning experience or results.  Our office number is [phone]. Please speak with Denise Phelps, RN. She is our Lung Cancer Screening RN. If she is unavailable when you call, please have the office staff send her a message. She will return your call at her earliest convenience. Remember, if your scan is normal, we will scan you annually as long as you continue to meet the criteria for the program. (Age 55-77, Current smoker or smoker who has quit within the last 15 years). If you are a smoker, remember, quitting is the single most powerful action that you can take to decrease your risk of lung cancer and other pulmonary, breathing related problems. We know quitting is hard, and we are here to help.  Please let us know if there is anything we can do to help you meet your goal of quitting. If you are a former smoker, congratulations. We are proud of you! Remain smoke free! Remember you can refer friends or family members through the number above.  We will screen them to make sure they meet criteria for the program. Thank you for helping us take better care of you by participating in Lung Screening.  Lung RADS Categories:  Lung RADS 1: no nodules  or definitely non-concerning nodules.  Recommendation is for a repeat annual scan in 12 months.  Lung RADS 2:  nodules that are non-concerning in appearance and behavior with a very low likelihood of becoming an active cancer. Recommendation is for a repeat annual scan in 12 months.  Lung RADS 3: nodules that are probably non-concerning , includes nodules with a low likelihood of becoming an active cancer.  Recommendation is for a 6-month repeat screening scan. Often noted after an upper respiratory illness. We will be in touch to make sure you have no questions, and to schedule your 6-month scan.  Lung RADS 4 A: nodules with concerning findings, recommendation is most often for a follow up scan in 3 months or additional testing based on our provider's assessment of the scan. We will be in touch to make sure you have no questions and to schedule the recommended 3 month follow up scan.  Lung RADS 4 B:  indicates findings that are concerning. We will be in touch with you to schedule additional diagnostic testing based on our provider's  assessment of the scan.   

## 2021-02-05 ENCOUNTER — Telehealth: Payer: Self-pay

## 2021-02-05 NOTE — Telephone Encounter (Signed)
Patient calls in with a concern for pain that started yesterday evening located between L leg and hip bone that makes it painful to walk. She has not had any problems with this leg before . She as been taking ibuprofen for it. Patient was advised no appt are available today and may need evaluation at UC/ ER. Patient would like to know if this is something that she can see her chiropractor for . Please advise

## 2021-02-05 NOTE — Telephone Encounter (Signed)
Patient has been made aware of the Doctors recommendations, she verbalizes understanding.

## 2021-02-05 NOTE — Telephone Encounter (Signed)
This is something that she could see her chiropractor for  But also she could be given one of my open appointments for Wednesday or Thursday

## 2021-02-07 NOTE — Telephone Encounter (Signed)
Patient looking for results to CT scan done on 02/05/21, she is concerned about the emphysema found on the impression as the result has been released to her via MyChart

## 2021-02-08 NOTE — Progress Notes (Signed)
I have called this patient with the results of her scan. I explained that her scan was read as a Lung RADS 2: nodules that are benign in appearance and behavior with a very low likelihood of becoming a clinically active cancer due to size or lack of growth. Recommendation per radiology is for a repeat LDCT in 12 months.  The patient has concerns about the notation of emphysema. She states she has no symptoms of emphysema . She had an aunt with emphysema who had never smoked. I offered her an appointment with a pulmonary doc with PFT's and alpha 1 check. She would like to be seen to do this.  Triage, please set patient up with one of the docs with openings and let her know. The patient was concerned about notation of emphysema on her screening scan and would like to be seen for PFT's and alpha 1 testing. Langley Gauss, please fax results to PCP and order annual scan for 01/2022. Thanks so much

## 2021-02-08 NOTE — Telephone Encounter (Signed)
Per Eric Form will close message  I have called the patient with the results of the scan. I have sent triage a message to set the patient up with a doc for consult for evaluation of emphysema with PFT's and alpha 1 testing. ( She had a family member with emphysema who had never smoker). thanks

## 2021-02-11 ENCOUNTER — Telehealth: Payer: Self-pay

## 2021-02-11 NOTE — Telephone Encounter (Signed)
-----   Message from Magdalen Spatz, NP sent at 02/08/2021 10:38 AM EDT ----- I have called this patient with the results of her scan. I explained that her scan was read as a Lung RADS 2: nodules that are benign in appearance and behavior with a very low likelihood of becoming a clinically active cancer due to size or lack of growth. Recommendation per radiology is for a repeat LDCT in 12 months.  The patient has concerns about the notation of emphysema. She states she has no symptoms of emphysema . She had an aunt with emphysema who had never smoked. I offered her an appointment with a pulmonary doc with PFT's and alpha 1 check. She would like to be seen to do this.  Triage, please set patient up with one of the docs with openings and let her know. The patient was concerned about notation of emphysema on her screening scan and would like to be seen for PFT's and alpha 1 testing. Langley Gauss, please fax results to PCP and order annual scan for 01/2022. Thanks so much

## 2021-02-11 NOTE — Telephone Encounter (Signed)
Called and spoke with patient to get her scheduled for an appointment to see Dr. Patient expressed that she had some time to think about it and that she would like to hold off on the appointment and testing for now. Advised patient that if she changed her mind at any time to call and let us know and we can get her scheduled. She expressed understanding. Nothing further needed at this time.

## 2021-02-12 ENCOUNTER — Other Ambulatory Visit: Payer: Self-pay | Admitting: *Deleted

## 2021-02-12 DIAGNOSIS — Z87891 Personal history of nicotine dependence: Secondary | ICD-10-CM

## 2021-05-27 ENCOUNTER — Ambulatory Visit
Admission: EM | Admit: 2021-05-27 | Discharge: 2021-05-27 | Disposition: A | Discharge status: Home / Self Care | Payer: BC Managed Care – PPO

## 2021-05-27 ENCOUNTER — Other Ambulatory Visit: Payer: Self-pay

## 2021-05-27 DIAGNOSIS — J3089 Other allergic rhinitis: Secondary | ICD-10-CM

## 2021-05-27 DIAGNOSIS — Z1152 Encounter for screening for COVID-19: Secondary | ICD-10-CM

## 2021-05-27 DIAGNOSIS — B349 Viral infection, unspecified: Secondary | ICD-10-CM

## 2021-05-27 MED ORDER — PSEUDOEPHEDRINE HCL 60 MG PO TABS: 60.0000 mg | ORAL_TABLET | Freq: Three times a day (TID) | ORAL | 0 refills | Status: DC | PRN

## 2021-05-27 MED ORDER — FLUTICASONE PROPIONATE 50 MCG/ACT NA SUSP: 2.0000 | Freq: Every day | NASAL | 12 refills | Status: DC

## 2021-05-27 MED ORDER — CETIRIZINE HCL 10 MG PO TABS: 10.0000 mg | ORAL_TABLET | Freq: Every day | ORAL | 0 refills | Status: DC

## 2021-05-27 NOTE — Discharge Instructions (Addendum)
We will notify you of your COVID-19 test results as they arrive and may take between 48-72 hours.  I encourage you to sign up for MyChart if you have not already done so as this can be the easiest way for us to communicate results to you online or through a phone app.  Generally, we only contact you if it is a positive COVID result.  In the meantime, if you develop worsening symptoms including fever, chest pain, shortness of breath despite our current treatment plan then please report to the emergency room as this may be a sign of worsening status from possible COVID-19 infection.  Otherwise, we will manage this as a viral syndrome. For sore throat or cough try using a honey-based tea. Use 3 teaspoons of honey with juice squeezed from half lemon. Place shaved pieces of ginger into 1/2-1 cup of water and warm over stove top. Then mix the ingredients and repeat every 4 hours as needed. Please take Tylenol 500mg-650mg every 6 hours for aches and pains, fevers. Hydrate very well with at least 2 liters of water. Eat light meals such as soups to replenish electrolytes and soft fruits, veggies. Start an antihistamine like Zyrtec for postnasal drainage, sinus congestion.  You can take this together with pseudoephedrine (Sudafed) at a dose of 60 mg 2-3 times a day as needed for the same kind of congestion.    

## 2021-05-27 NOTE — ED Triage Notes (Signed)
Pt reports s/s starting 5 days ago - HA (pressure in forehead), congestion, scratchy throat and nasal passages.  Has taken Sudafed and Ibuprofen with some relief.  Reports pressure in ears.  Reports aching in upper back and shoulders with some generalized body aches.  No fevers, occasional cough. Home COVID negative x 2 but requests PCR test today.

## 2021-05-27 NOTE — ED Provider Notes (Signed)
[facility]   MRN: [id number] DOB: 04-May-1971  Subjective:   Diana Tate is a 50 y.o. female presenting for 5-day history of sinus congestion, scratchy throat, sinus headaches, bilateral pressure in her ears, body aches.  Denies fever, chest pain, shortness of breath, wheezing.  She has had an occasional cough.  Did 2 COVID tests at home that was negative.  Patient is a former smoker, quit 5 years ago.  Reports that she has a very difficult time with any seasonal change.  This is pretty consistent for her.  No current facility-administered medications for this encounter.  Current Outpatient Medications:    Cholecalciferol (VITAMIN D) 50 MCG (2000 UT) tablet, Take 5,000 Units by mouth daily., Disp: , Rfl:    Cyanocobalamin (VITAMIN B 12) 500 MCG TABS, Take 1 tablet by mouth daily., Disp: , Rfl:    pantoprazole (PROTONIX) 20 MG tablet, Take 20 mg by mouth daily., Disp: , Rfl:    Magnesium 200 MG TABS, Take 2 tablets by mouth., Disp: , Rfl:    Allergies  Allergen Reactions   Lexapro [Escitalopram Oxalate] Nausea Only    Past Medical History:  Diagnosis Date   GERD (gastroesophageal reflux disease)    Hyperlipidemia    Migraine    Classic     Past Surgical History:  Procedure Laterality Date   BREAST EXCISIONAL BIOPSY Right 04/2012   BREAST SURGERY     benign   CHOLECYSTECTOMY  2006   ENDOMETRIAL ABLATION  2007   ESOPHAGOGASTRODUODENOSCOPY N/A 05/22/2014   SLF: 1. Schatzki ring was found 2. MIld non-erosive gastritis   TONSILLECTOMY  2000    Family History  Problem Relation Age of Onset   Hypertension Mother    Cancer Maternal Uncle        lung   Cancer Maternal Grandfather        skin   Diabetes Other        second degree relative   Colon cancer Neg Hx    Esophageal cancer Neg Hx     Social History   Tobacco Use   Smoking status: Former    Packs/day: 1.00    Years: 24.00    Pack years: 24.00    Types: Cigarettes   Smokeless  tobacco: Never   Tobacco comments:    Quit x 2 years  Substance Use Topics   Alcohol use: No    Alcohol/week: 0.0 standard drinks    Comment: occ   Drug use: No    ROS   Objective:   Vitals: BP 128/87 (BP Location: Right Arm)   Pulse 71   Temp 97.8 F (36.6 C) (Oral)   Resp 16   SpO2 98%   Physical Exam Constitutional:      General: She is not in acute distress.    Appearance: Normal appearance. She is well-developed. She is not ill-appearing, toxic-appearing or diaphoretic.  HENT:     Head: Normocephalic and atraumatic.     Right Ear: Tympanic membrane, ear canal and external ear normal. No drainage or tenderness. No middle ear effusion. Tympanic membrane is not erythematous.     Left Ear: Tympanic membrane, ear canal and external ear normal. No drainage or tenderness.  No middle ear effusion. Tympanic membrane is not erythematous.     Nose: Congestion present. No rhinorrhea.     Comments: Nasal mucosa boggy and edematous.    Mouth/Throat:     Mouth: Mucous membranes are moist. No oral lesions.  Pharynx: Oropharynx is clear. No pharyngeal swelling, oropharyngeal exudate, posterior oropharyngeal erythema or uvula swelling.     Tonsils: No tonsillar exudate or tonsillar abscesses.  Eyes:     General: No scleral icterus.       Right eye: No discharge.        Left eye: No discharge.     Extraocular Movements: Extraocular movements intact.     Right eye: Normal extraocular motion.     Left eye: Normal extraocular motion.     Conjunctiva/sclera: Conjunctivae normal.     Pupils: Pupils are equal, round, and reactive to light.  Cardiovascular:     Rate and Rhythm: Normal rate and regular rhythm.     Pulses: Normal pulses.     Heart sounds: Normal heart sounds. No murmur heard.   No friction rub. No gallop.  Pulmonary:     Effort: Pulmonary effort is normal. No respiratory distress.     Breath sounds: Normal breath sounds. No stridor. No wheezing, rhonchi or rales.   Musculoskeletal:     Cervical back: Normal range of motion and neck supple.  Lymphadenopathy:     Cervical: No cervical adenopathy.  Skin:    General: Skin is warm and dry.     Findings: No rash.  Neurological:     General: No focal deficit present.     Mental Status: She is alert and oriented to person, place, and time.  Psychiatric:        Mood and Affect: Mood normal.        Behavior: Behavior normal.        Thought Content: Thought content normal.        Judgment: Judgment normal.     Assessment and Plan :   PDMP not reviewed this encounter.  1. Allergic rhinitis due to other allergic trigger, unspecified seasonality   2. Encounter for screening for COVID-19   3. Viral illness     Will manage for viral illness such as allergic rhinitis, viral URI, viral syndrome, viral rhinitis, COVID-19. Counseled patient on nature of COVID-19 including modes of transmission, diagnostic testing, management and supportive care.  Offered scripts for symptomatic relief. COVID 19 testing is pending. Counseled patient on potential for adverse effects with medications prescribed/recommended today, ER and return-to-clinic precautions discussed, patient verbalized understanding.      Jaynee Eagles, [place] 05/27/21 0258

## 2021-05-28 LAB — NOVEL CORONAVIRUS, NAA: SARS-CoV-2, NAA: NOT DETECTED

## 2021-05-28 LAB — SARS-COV-2, NAA 2 DAY TAT

## 2021-06-13 ENCOUNTER — Other Ambulatory Visit: Payer: Self-pay | Admitting: Family Medicine

## 2021-06-26 ENCOUNTER — Telehealth: Payer: Self-pay | Admitting: Primary Care

## 2021-06-26 NOTE — Telephone Encounter (Signed)
Called and spoke with pt letting her know the info stated by [facility] and she verbalized understanding. Have scheduled pt an appt with AO 11/22 and stated to pt if there was any schedule conflict when she went to work tomorrow 11/10 to call the office and we could get the appt rescheduled. Nothing further needed.

## 2021-06-26 NOTE — Telephone Encounter (Signed)
Overdue for 6 month follow-up OSA. Can we get her a visit with Dr. Ander Slade or me. We had talked about possibly starting medication for persistent daytime sleepiness despite compliance with CPAP so I wanted to follow-up on that.

## 2021-07-04 ENCOUNTER — Other Ambulatory Visit: Payer: Self-pay | Admitting: Surgery

## 2021-07-04 DIAGNOSIS — Z1239 Encounter for other screening for malignant neoplasm of breast: Secondary | ICD-10-CM

## 2021-07-04 DIAGNOSIS — N6452 Nipple discharge: Secondary | ICD-10-CM

## 2021-07-09 ENCOUNTER — Ambulatory Visit: Payer: BC Managed Care – PPO | Admitting: Pulmonary Disease

## 2021-07-17 ENCOUNTER — Other Ambulatory Visit: Payer: Self-pay | Admitting: Obstetrics and Gynecology

## 2021-07-17 ENCOUNTER — Ambulatory Visit
Admission: RE | Admit: 2021-07-17 | Discharge: 2021-07-17 | Disposition: A | Discharge status: Home / Self Care | Payer: BC Managed Care – PPO | Source: Ambulatory Visit | Attending: Obstetrics and Gynecology | Admitting: Obstetrics and Gynecology

## 2021-07-17 DIAGNOSIS — R921 Mammographic calcification found on diagnostic imaging of breast: Secondary | ICD-10-CM

## 2021-07-17 IMAGING — MG DIGITAL DIAGNOSTIC BILAT W/ TOMO W/ CAD
8 of 16 series · 8 of 40 positions shown · non-contrast
Comparison: Previous exam(s).

CLINICAL DATA: One year follow-up of probably benign right breast
calcifications.

The patient also states that she has had a spontaneous yellow to
blood-tinged right nipple discharge for several months.
EXAM:
DIGITAL DIAGNOSTIC BILATERAL MAMMOGRAM WITH TOMOSYNTHESIS AND CAD;
ULTRASOUND RIGHT BREAST LIMITED
TECHNIQUE: Bilateral digital diagnostic mammography and breast tomosynthesis
was performed. The images were evaluated with computer-aided
detection.; Targeted ultrasound examination of the right breast was
performed

[R ML]
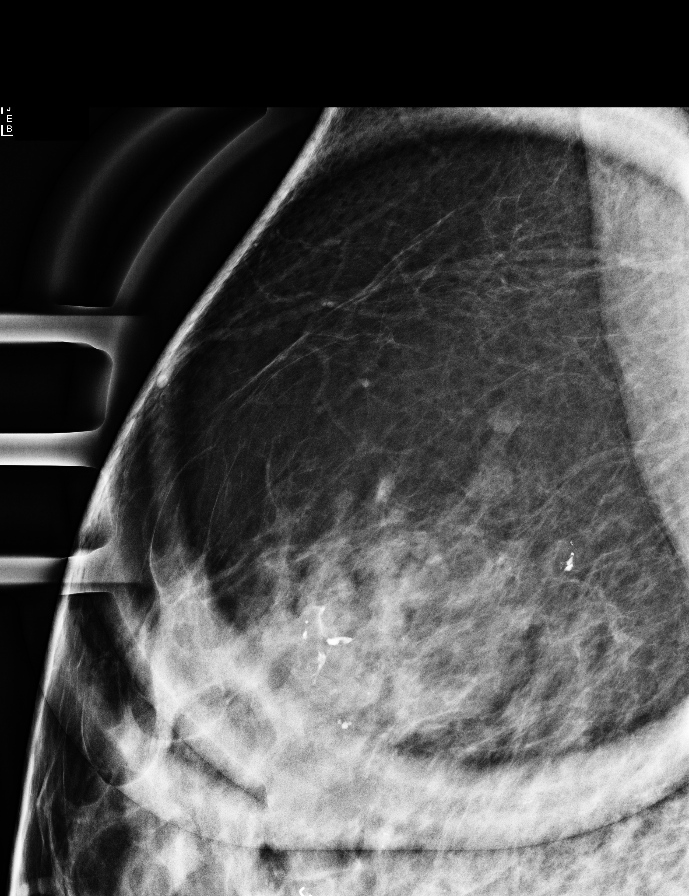

[R CC]
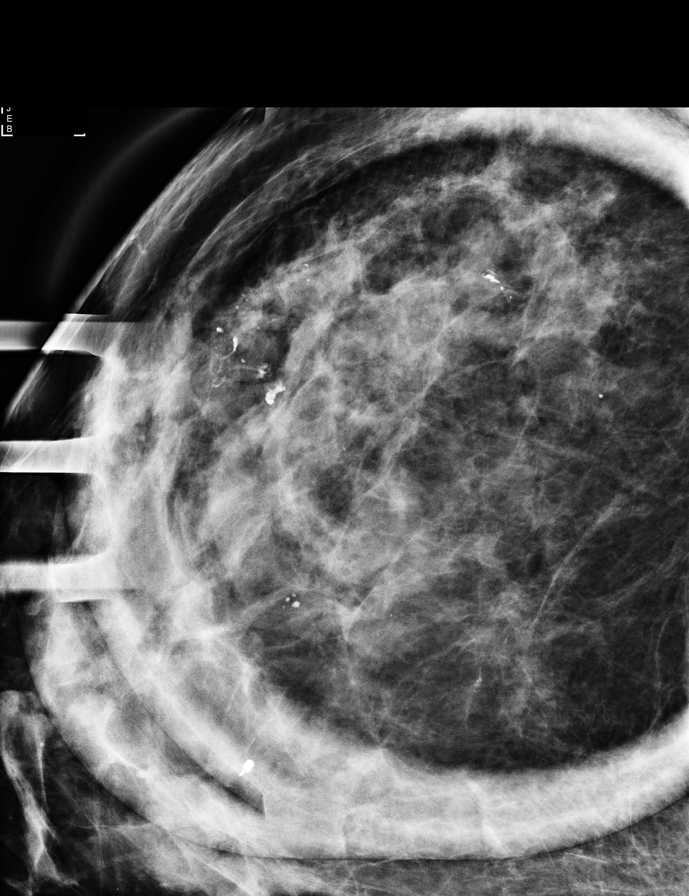

[L MLO synth-2D]
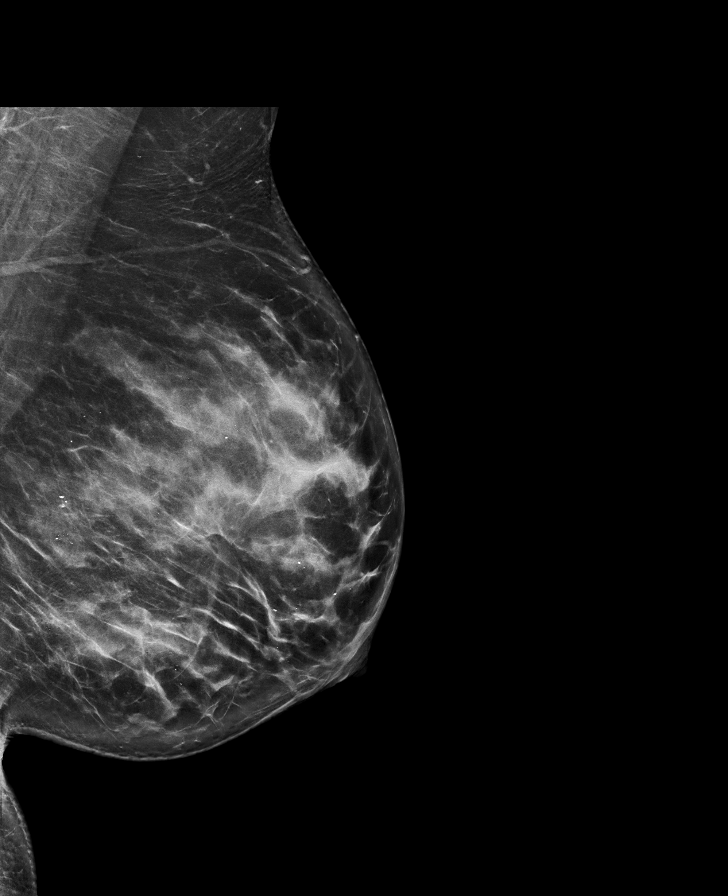

[R MLO synth-2D (1 of 3)]
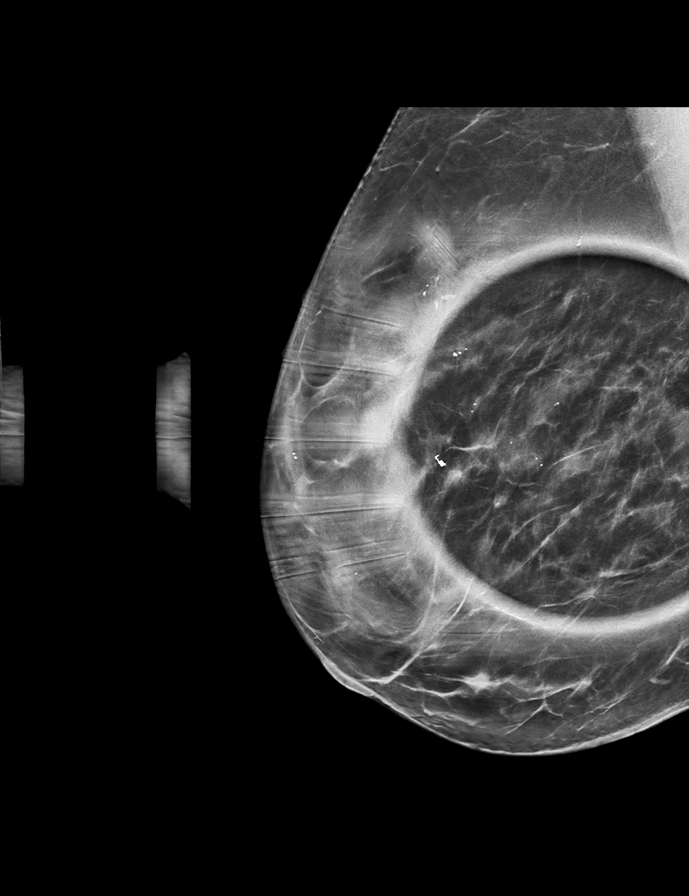

[R MLO synth-2D (2 of 3)]
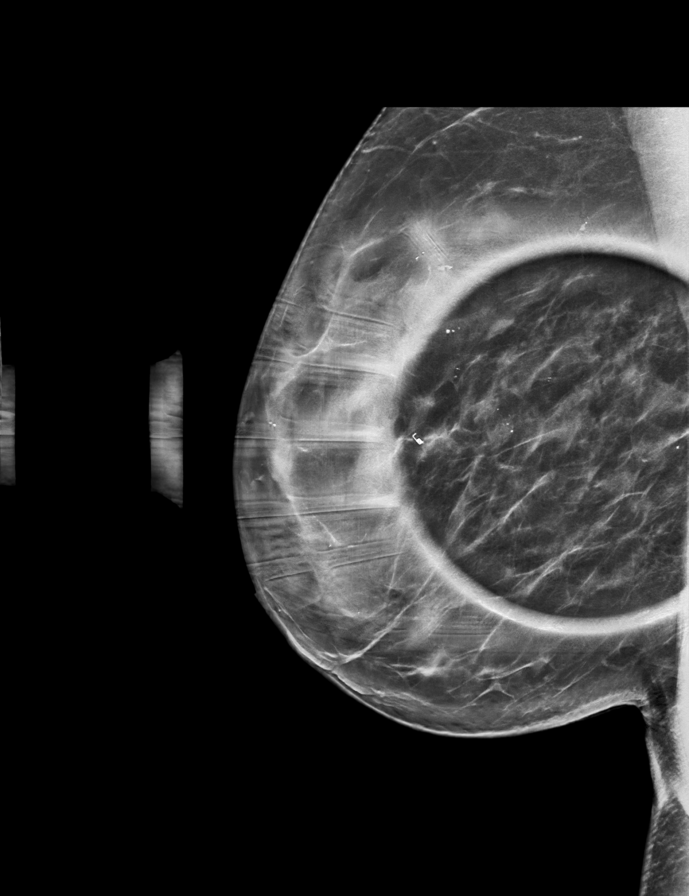

[R MLO synth-2D (3 of 3)]
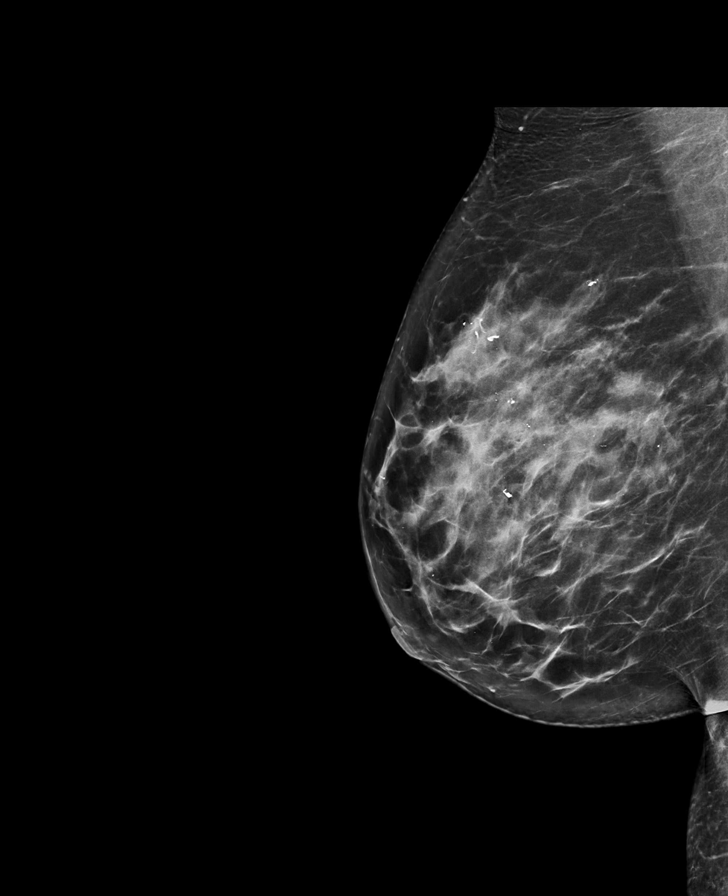

[R CC synth-2D (1 of 2)]
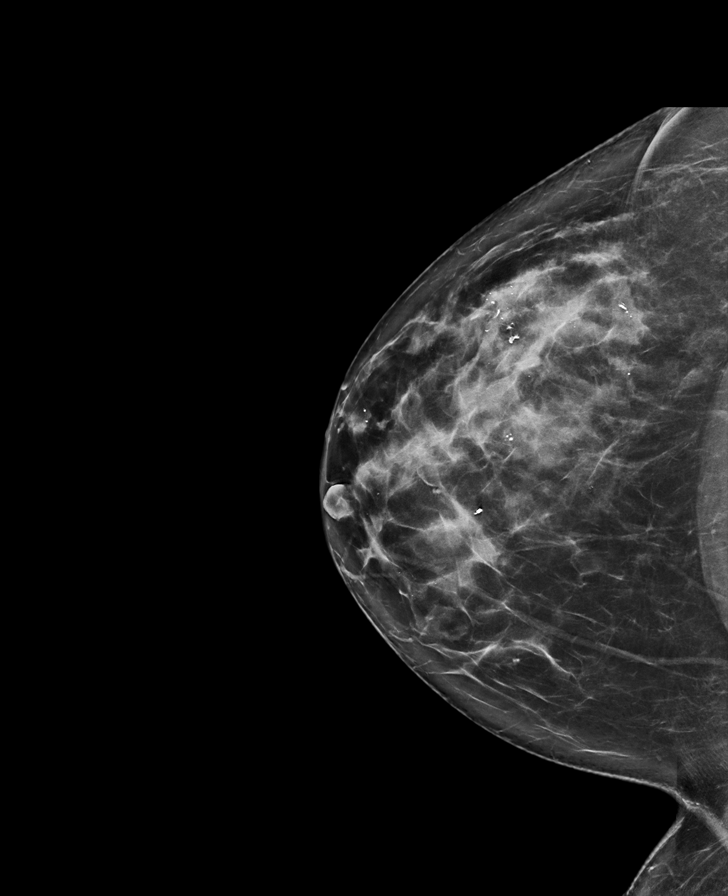

[R CC synth-2D (2 of 2)]
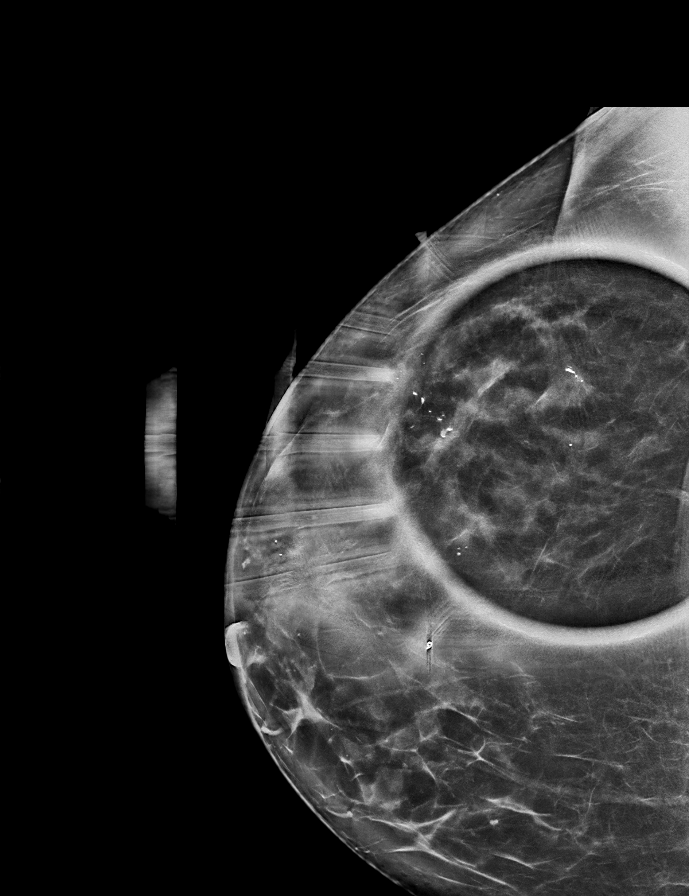

[8 of 40 positions shown; findings below may reference images not displayed]

ACR Breast Density Category c: The breast tissue is heterogeneously
dense, which may obscure small masses.
FINDINGS: Mammographically, there is increase in the prominence of the
secretory appearing calcifications in the right breast upper outer
quadrant, middle depth group, and decrease in the number of
calcifications in the right breast upper outer quadrant, posterior
depth group. There is an asymmetry in the slightly upper right
breast, posterior depth, seen best on the MLO views, with likely
correlation in the upper outer quadrant. Of note, this asymmetry is
less conspicuous on the spot compression views.

No suspicious masses, areas of nonsurgical architectural distortion
or clustered calcifications in the left breast. Stable postsurgical
changes in and in the left breast.

Targeted right breast ultrasound is performed demonstrating 10
o'clock 9 cm from the nipple cluster of benign-appearing cysts with
coarse linear intramural calcifications. This cluster of cysts
likely corresponds to the more anterior of the 2 groups of
calcifications seen mammographically. Additionally, in the right
breast 9:30 o'clock 7 cm from the nipple there is an ill-defined
heterogeneous tissue versus mass which measures 3.0 x 1.1 x 2.4 cm.
This finding may correspond to the asymmetry seen mammographically
in the upper-outer quadrant of the right breast, posterior depth.
There is no evidence of right axillary lymphadenopathy.

On physical exam there is an elongated thickening in the right 9:30
o'clock breast, middle to posterior depth, without discrete mass.
IMPRESSION: Right breast 9:30 o'clock ill-defined sonographically seen mass,
which corresponds to an area of thickening on physical exam, for
which ultrasound-guided core needle biopsy is recommended. Attention
on postprocedural mammogram is recommended to establish whether this
abnormality corresponds to the focal asymmetry seen mammographically
the right breast upper outer quadrant, posterior depth.

Two groups of probably benign calcifications in the right breast
upper outer quadrant, waxing and waning in nature, likely
representing secretory calcifications associated with micro and
macro cysts.

RECOMMENDATION:
Recommend ultrasound-guided core needle biopsy of right breast 9:30
o'clock mass.

Also recommend breast MRI with contrast, given the history of
intermittent spontaneous right nipple discharge. Attention to the
right breast upper outer quadrant, posterior depth, to the site of
the mammographically seen focal asymmetry.

I have discussed the findings and recommendations with the patient.
If applicable, a reminder letter will be sent to the patient
regarding the next appointment.

BI-RADS CATEGORY  4: Suspicious.

## 2021-07-17 IMAGING — US US BREAST*R* LIMITED INC AXILLA
1 series · 12 of 25 positions shown · non-contrast
Comparison: Previous exam(s).

CLINICAL DATA: One year follow-up of probably benign right breast
calcifications.

The patient also states that she has had a spontaneous yellow to
blood-tinged right nipple discharge for several months.
EXAM:
DIGITAL DIAGNOSTIC BILATERAL MAMMOGRAM WITH TOMOSYNTHESIS AND CAD;
ULTRASOUND RIGHT BREAST LIMITED
TECHNIQUE: Bilateral digital diagnostic mammography and breast tomosynthesis
was performed. The images were evaluated with computer-aided
detection.; Targeted ultrasound examination of the right breast was
performed

[Series 1: us breast*right* limited inc axilla · 29 acquisitions, 12 frames shown]
[im 2/29]
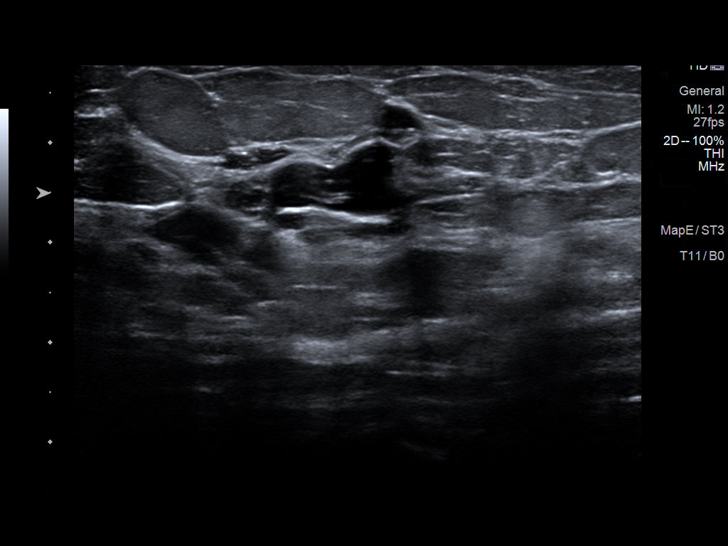
[im 4/29]
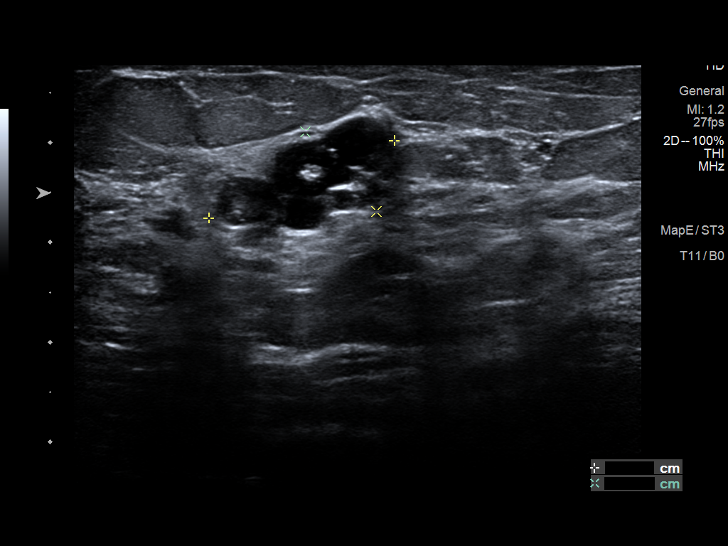
[im 6/29]
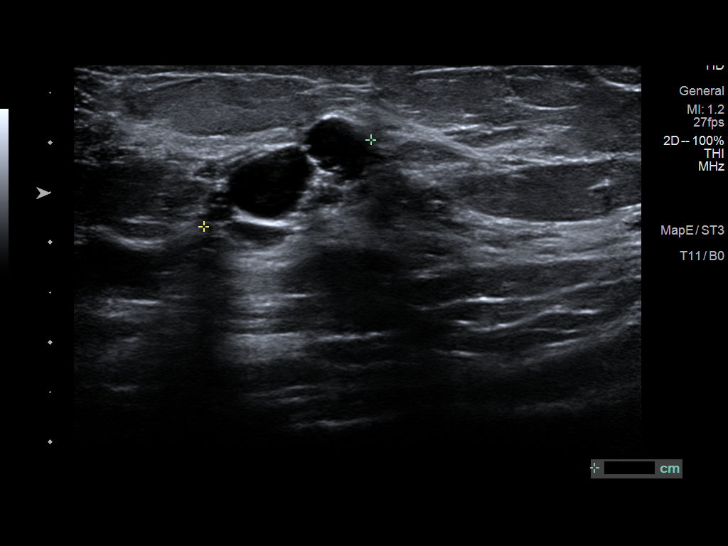
[im 9/29]
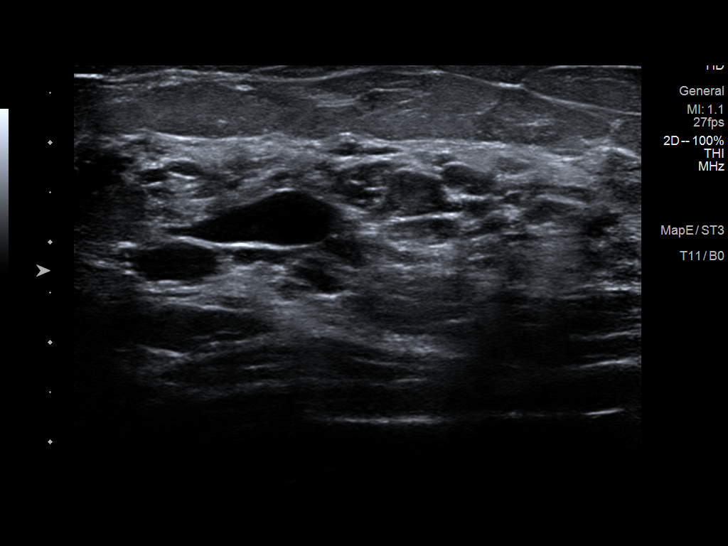
[im 11/29]
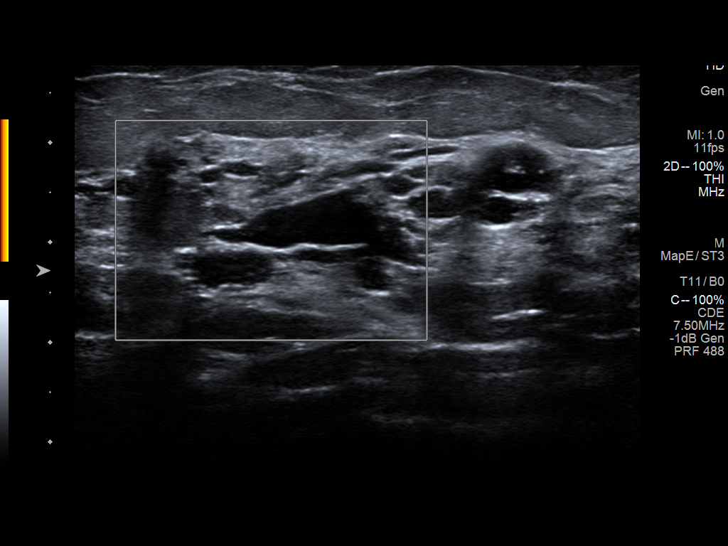
[im 13/29]
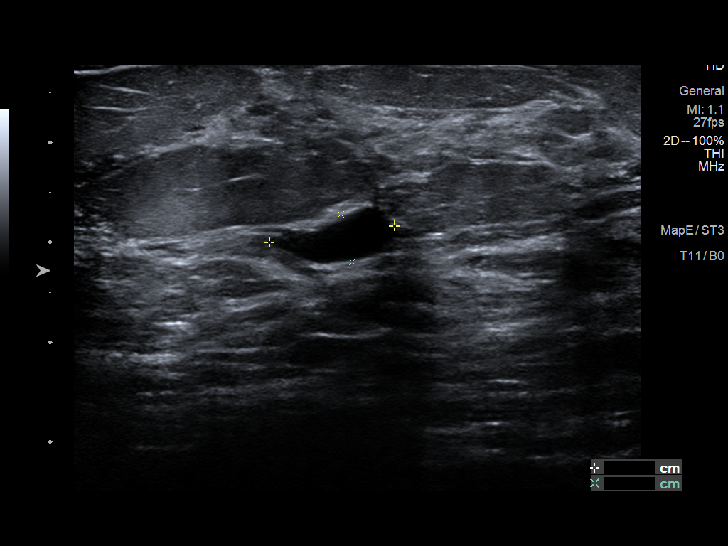
[im 16/29]
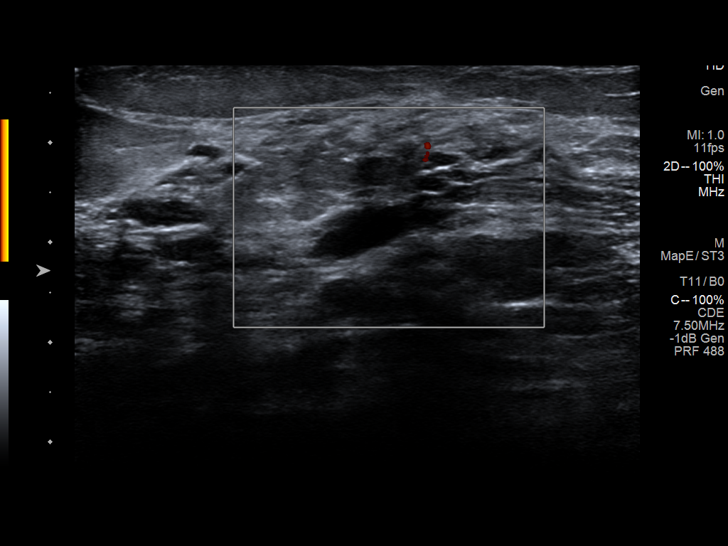
[im 18/29]
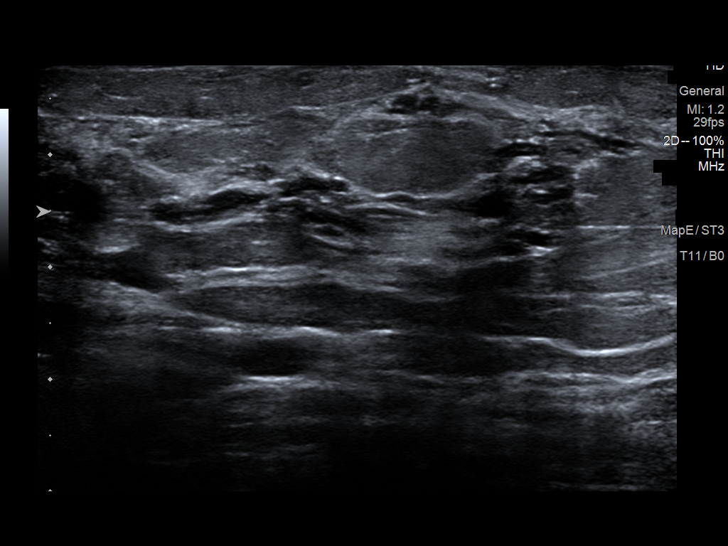
[im 20/29]
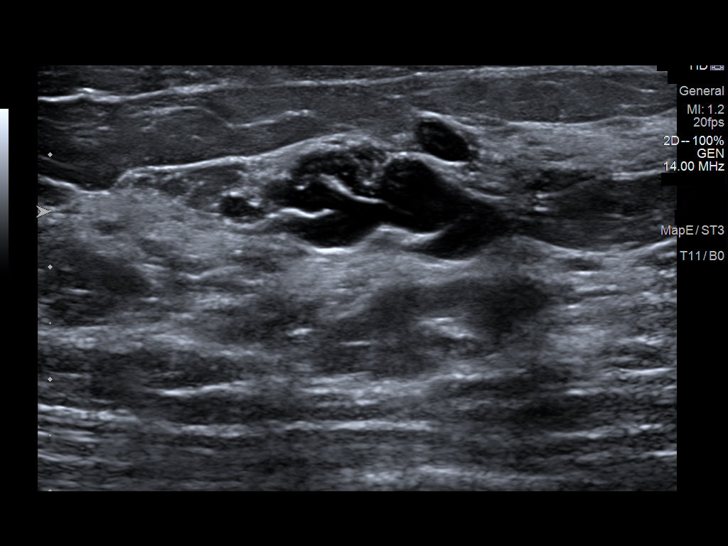
[im 23/29]
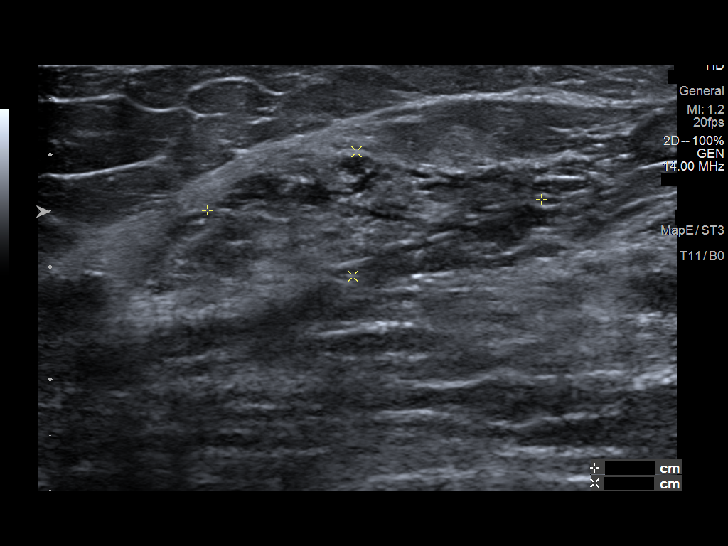
[im 25/29]
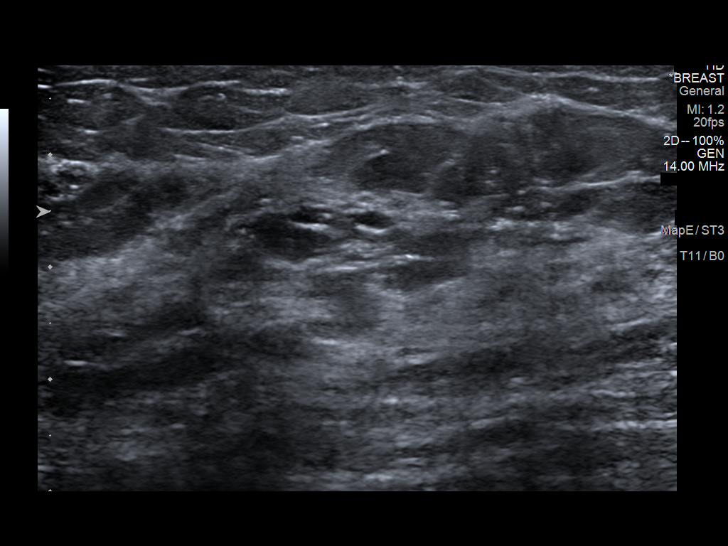
[im 27/29]
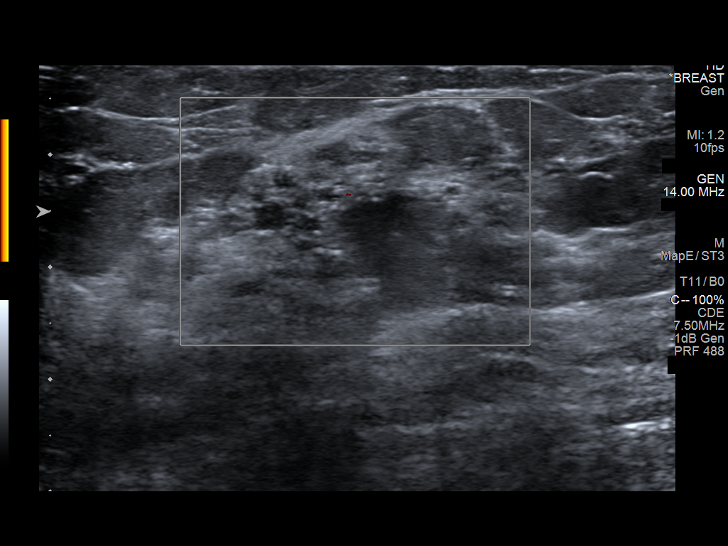

[12 of 25 positions shown; findings below may reference images not displayed]

ACR Breast Density Category c: The breast tissue is heterogeneously
dense, which may obscure small masses.
FINDINGS: Mammographically, there is increase in the prominence of the
secretory appearing calcifications in the right breast upper outer
quadrant, middle depth group, and decrease in the number of
calcifications in the right breast upper outer quadrant, posterior
depth group. There is an asymmetry in the slightly upper right
breast, posterior depth, seen best on the MLO views, with likely
correlation in the upper outer quadrant. Of note, this asymmetry is
less conspicuous on the spot compression views.

No suspicious masses, areas of nonsurgical architectural distortion
or clustered calcifications in the left breast. Stable postsurgical
changes in and in the left breast.

Targeted right breast ultrasound is performed demonstrating 10
o'clock 9 cm from the nipple cluster of benign-appearing cysts with
coarse linear intramural calcifications. This cluster of cysts
likely corresponds to the more anterior of the 2 groups of
calcifications seen mammographically. Additionally, in the right
breast 9:30 o'clock 7 cm from the nipple there is an ill-defined
heterogeneous tissue versus mass which measures 3.0 x 1.1 x 2.4 cm.
This finding may correspond to the asymmetry seen mammographically
in the upper-outer quadrant of the right breast, posterior depth.
There is no evidence of right axillary lymphadenopathy.

On physical exam there is an elongated thickening in the right 9:30
o'clock breast, middle to posterior depth, without discrete mass.
IMPRESSION: Right breast 9:30 o'clock ill-defined sonographically seen mass,
which corresponds to an area of thickening on physical exam, for
which ultrasound-guided core needle biopsy is recommended. Attention
on postprocedural mammogram is recommended to establish whether this
abnormality corresponds to the focal asymmetry seen mammographically
the right breast upper outer quadrant, posterior depth.

Two groups of probably benign calcifications in the right breast
upper outer quadrant, waxing and waning in nature, likely
representing secretory calcifications associated with micro and
macro cysts.

RECOMMENDATION:
Recommend ultrasound-guided core needle biopsy of right breast 9:30
o'clock mass.

Also recommend breast MRI with contrast, given the history of
intermittent spontaneous right nipple discharge. Attention to the
right breast upper outer quadrant, posterior depth, to the site of
the mammographically seen focal asymmetry.

I have discussed the findings and recommendations with the patient.
If applicable, a reminder letter will be sent to the patient
regarding the next appointment.

BI-RADS CATEGORY  4: Suspicious.

## 2021-07-26 ENCOUNTER — Ambulatory Visit
Admission: RE | Admit: 2021-07-26 | Discharge: 2021-07-26 | Disposition: A | Discharge status: Home / Self Care | Payer: BC Managed Care – PPO | Source: Ambulatory Visit | Attending: Obstetrics and Gynecology | Admitting: Obstetrics and Gynecology

## 2021-07-26 DIAGNOSIS — R921 Mammographic calcification found on diagnostic imaging of breast: Secondary | ICD-10-CM

## 2021-07-26 IMAGING — US US  BREAST BX W/ LOC DEV 1ST LESION IMG BX SPEC US GUIDE*R*
1 series · 12 of 12 positions shown · non-contrast
Comparison: Previous exam(s).
COMPARISON: Previous exam(s).

Addendum:
CLINICAL DATA: 50-year-old with a spontaneous intermittent RIGHT
nipple discharge, yellow and blood-tinged, for several months.
Recent diagnostic workup demonstrated an ill-defined hypoechoic
mass/tissue in the UPPER OUTER QUADRANT of the RIGHT breast at the
9:30 o'clock position 7 cm from the nipple, associated with palpable
thickening in this location.

[Series 1: us breast bx w/ loc dev 1st lesion img bx spec us  · 0.07mm/px · 12 of 12 slices shown]
[im 1/12]
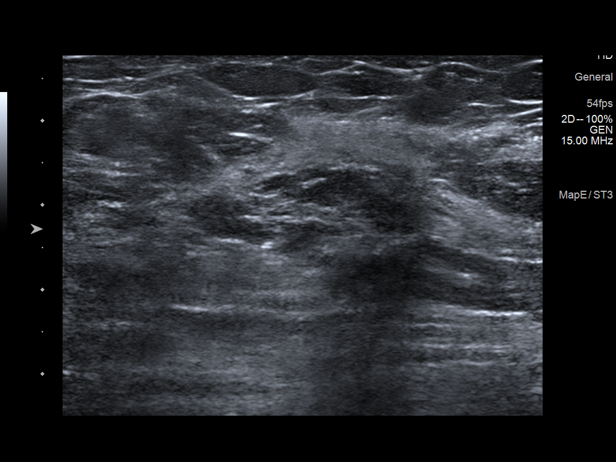
[im 2/12]
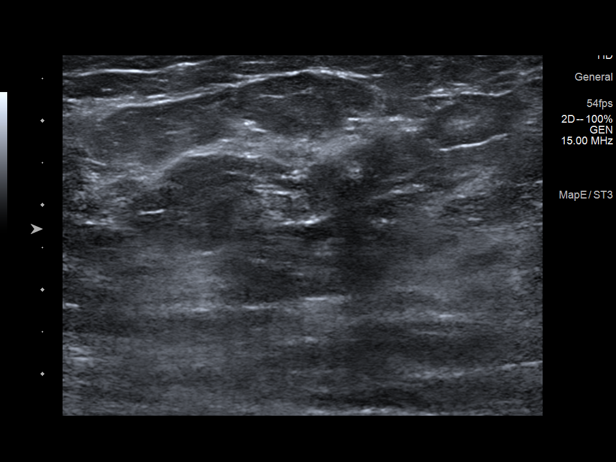
[im 3/12]
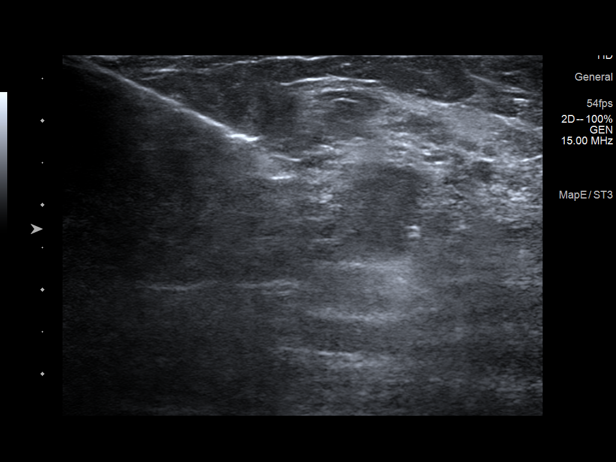
[im 4/12]
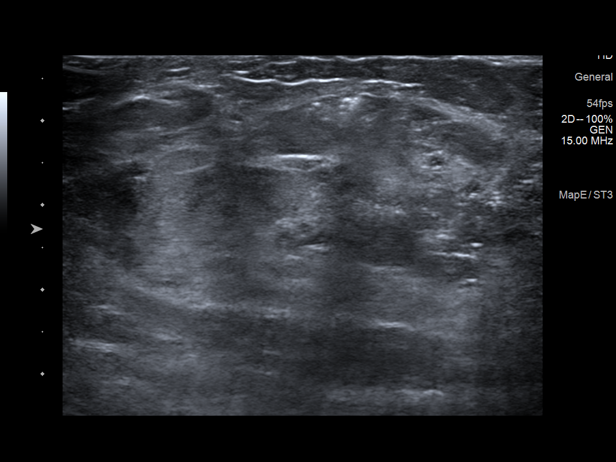
[im 5/12]
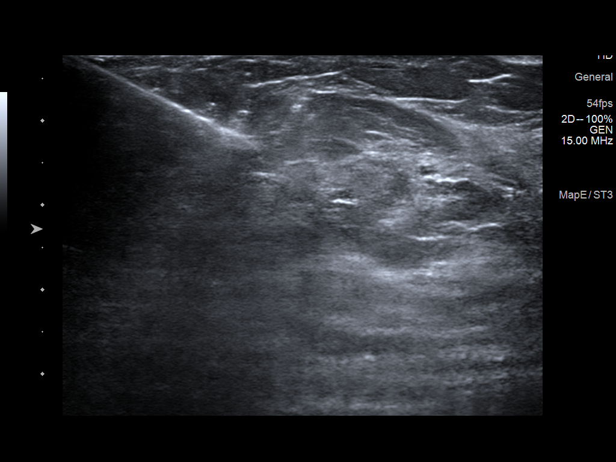
[im 6/12]
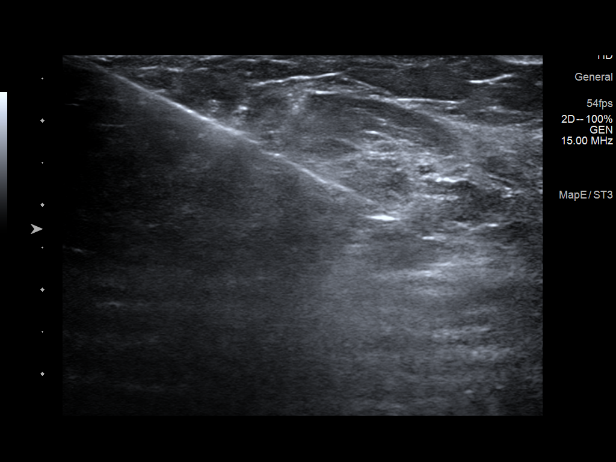
[im 7/12]
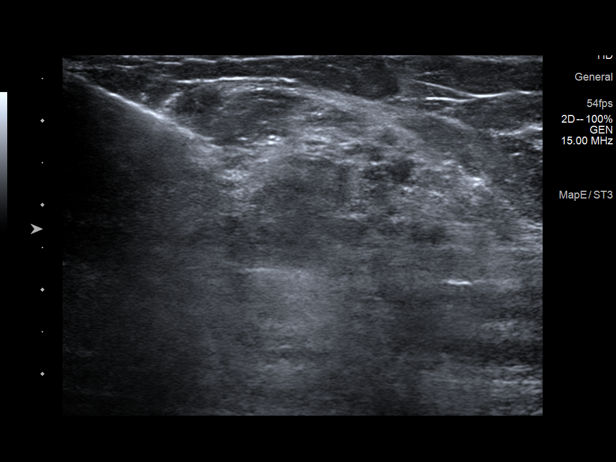
[im 8/12]
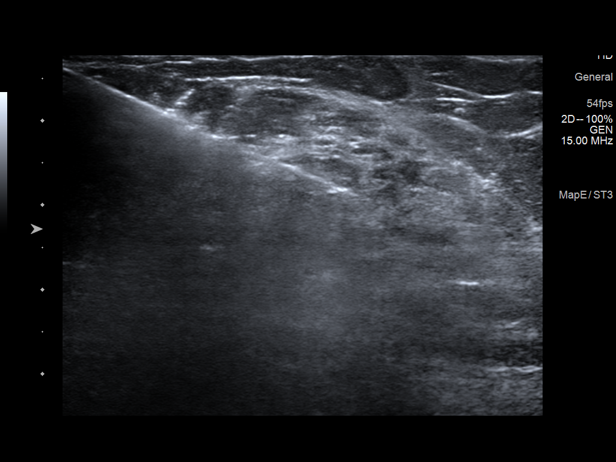
[im 9/12]
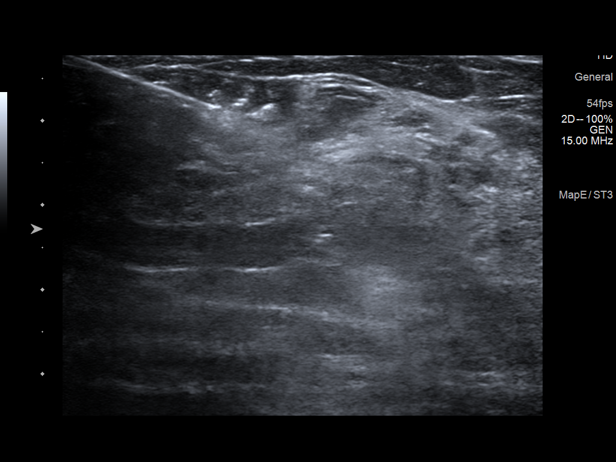
[im 10/12]
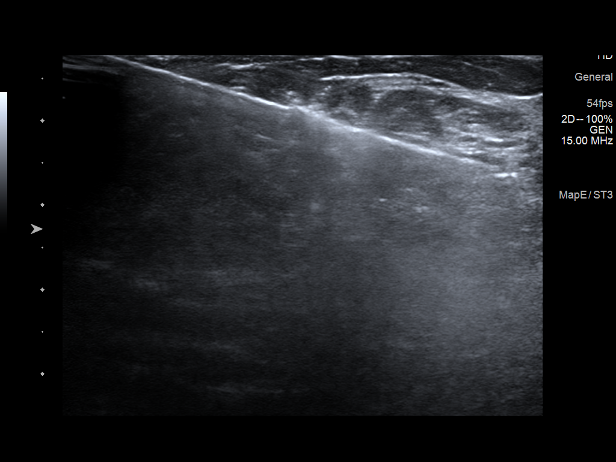
[im 11/12]
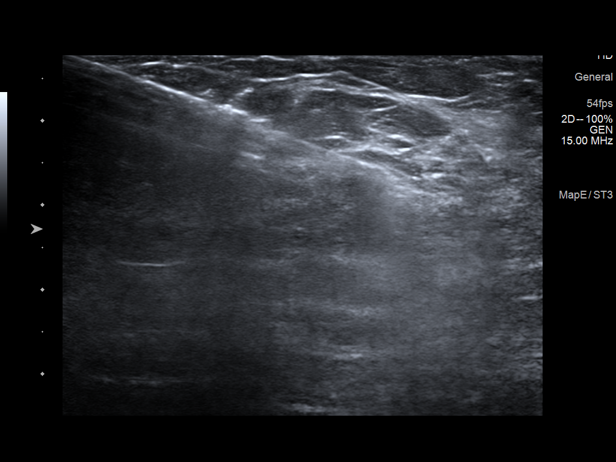
[im 12/12]
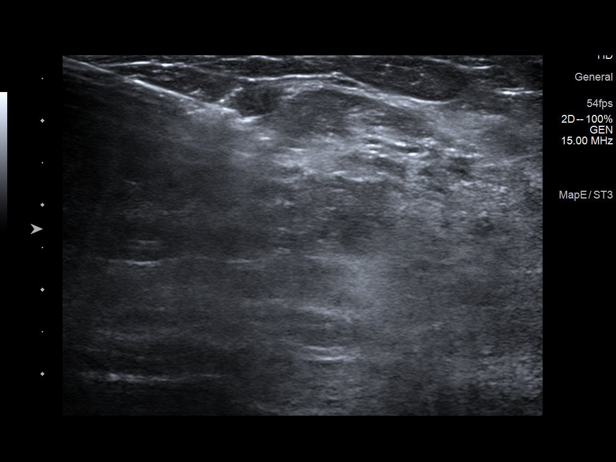

[12 of 12 positions shown; findings below may reference images not displayed]

Personal history of excisional biopsy of high risk atypical ductal
hyperplasia from the LEFT breast in [DATE]. Benign core
needle biopsy of the UPPER INNER QUADRANT of the RIGHT breast in
[DATE], pathology fibrocystic changes with calcifications.

EXAM:
ULTRASOUND GUIDED RIGHT BREAST CORE NEEDLE BIOPSY
PROCEDURE:
I met with the patient and we discussed the procedure of
ultrasound-guided biopsy, including benefits and alternatives. We
discussed the high likelihood of a successful procedure. We
discussed the risks of the procedure, including infection, bleeding,
tissue injury, clip migration, and inadequate sampling. Informed
written consent was given. The usual time-out protocol was performed
immediately prior to the procedure.

On initial correlative physical examination, there is palpable
thickening in the outer RIGHT breast at the 9-10 o'clock location.

Lesion quadrant: UPPER OUTER QUADRANT.

Using sterile technique with chlorhexidine skin antisepsis, 1%
Lidocaine as local anesthetic, under direct ultrasound
visualization, a 12 gauge LEWIS core needle device placed
through an 11 gauge introducer needle was used to perform biopsy of
the hypoechoic mass/tissue in the UPPER OUTER QUADRANT of the RIGHT
breast using an inferior approach.

At the conclusion of the procedure a ribbon shaped tissue marker
clip was deployed into the biopsy cavity. Follow up 2 view mammogram
was performed and dictated separately.
IMPRESSION: Ultrasound guided biopsy of an indeterminate vague hypoechoic
mass/tissue involving the UPPER OUTER QUADRANT of the RIGHT breast.
No apparent complications.

ADDENDUM:
Pathology revealed INTERMEDIATE GRADE DUCTAL CARCINOMA IN SITU of
the RIGHT breast, upper outer quadrant, 9:30 o'clock, 7 cm from the
nipple, (ribbon clip). This was found to be concordant by Dr. LEWIS
LEWIS.

Pathology results were discussed with the patient by telephone. The
patient reported doing well after the biopsy with tenderness at the
site. Post biopsy instructions and care were reviewed and questions
were answered. The patient was encouraged to call The [REDACTED] for any additional concerns. My direct phone
number was provided.

Surgical consultation has been arranged with Dr. LEWIS at
[REDACTED] on [DATE].

The patient is scheduled for a BILATERAL breast MRI on [DATE]. MRI should be helpful in determining extent of disease, given
the atypical imaging findings on mammography and ultrasound. Further
recommendations will be guided by the results of this study.

Pathology results reported by LEWIS, RN on [DATE].

*** End of Addendum ***
Personal history of excisional biopsy of high risk atypical ductal
hyperplasia from the LEFT breast in [DATE]. Benign core
needle biopsy of the UPPER INNER QUADRANT of the RIGHT breast in
[DATE], pathology fibrocystic changes with calcifications.

EXAM:
ULTRASOUND GUIDED RIGHT BREAST CORE NEEDLE BIOPSY
PROCEDURE:
I met with the patient and we discussed the procedure of
ultrasound-guided biopsy, including benefits and alternatives. We
discussed the high likelihood of a successful procedure. We
discussed the risks of the procedure, including infection, bleeding,
tissue injury, clip migration, and inadequate sampling. Informed
written consent was given. The usual time-out protocol was performed
immediately prior to the procedure.

On initial correlative physical examination, there is palpable
thickening in the outer RIGHT breast at the 9-10 o'clock location.

Lesion quadrant: UPPER OUTER QUADRANT.

Using sterile technique with chlorhexidine skin antisepsis, 1%
Lidocaine as local anesthetic, under direct ultrasound
visualization, a 12 gauge LEWIS core needle device placed
through an 11 gauge introducer needle was used to perform biopsy of
the hypoechoic mass/tissue in the UPPER OUTER QUADRANT of the RIGHT
breast using an inferior approach.

At the conclusion of the procedure a ribbon shaped tissue marker
clip was deployed into the biopsy cavity. Follow up 2 view mammogram
was performed and dictated separately.
IMPRESSION: Ultrasound guided biopsy of an indeterminate vague hypoechoic
mass/tissue involving the UPPER OUTER QUADRANT of the RIGHT breast.
No apparent complications.

## 2021-07-26 IMAGING — MG MM BREAST LOCALIZATION CLIP
4 series · 4 of 12 positions shown · non-contrast
Comparison: Previous exam(s).

CLINICAL DATA: Confirmation of clip placement after
ultrasound-guided core needle biopsy of a vague hypoechoic
mass/tissue involving the UPPER OUTER QUADRANT of the RIGHT breast
at the 9:30 o'clock location.

EXAM:
2D and 3D DIAGNOSTIC RIGHT MAMMOGRAM POST ULTRASOUND BIOPSY

[R CC synth-2D]
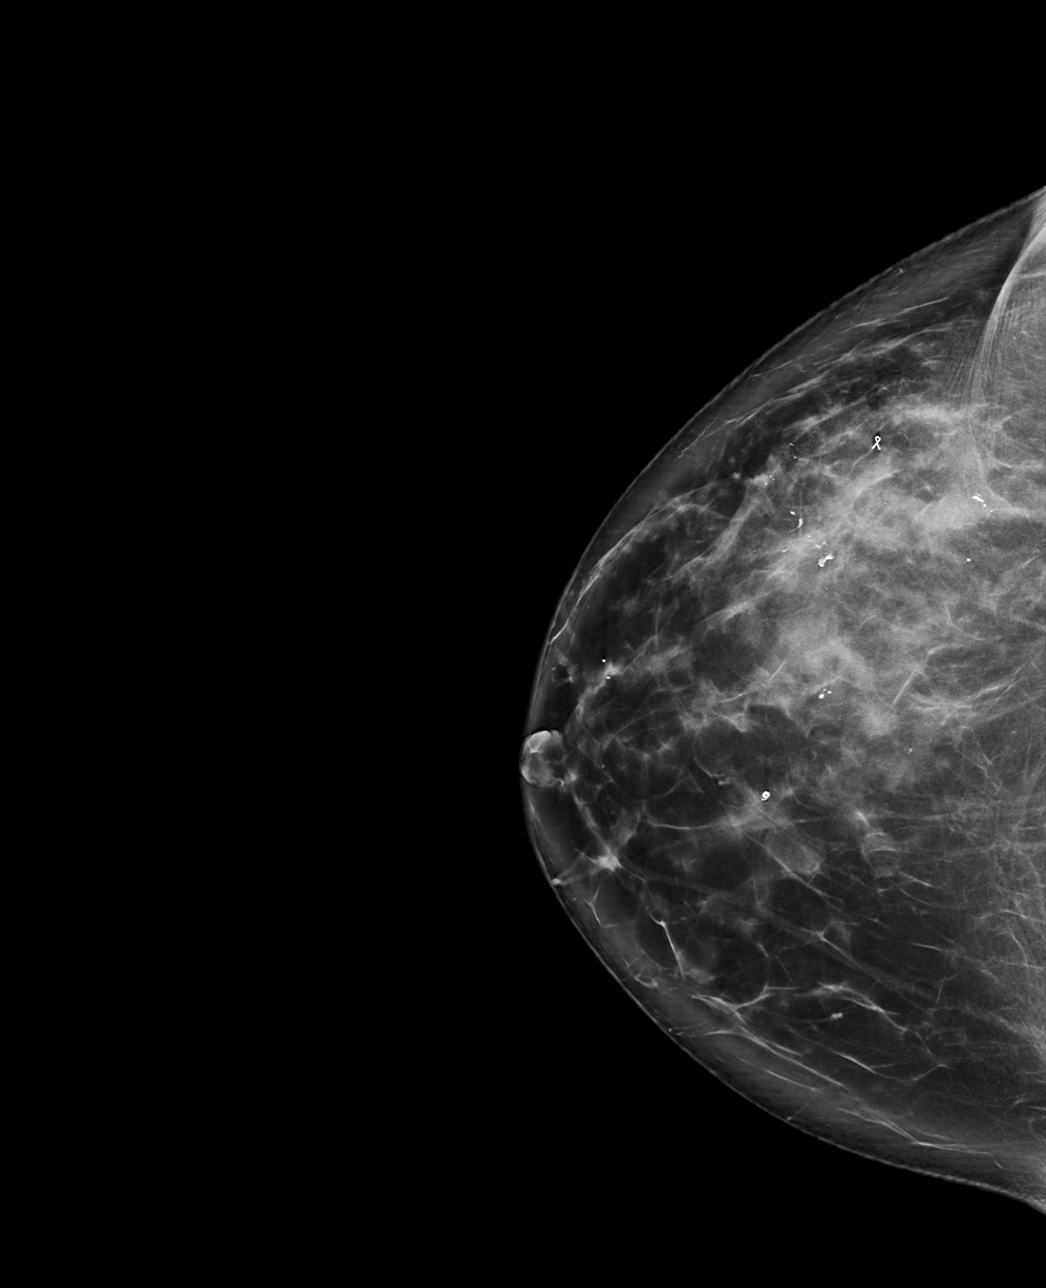

[R ML synth-2D]
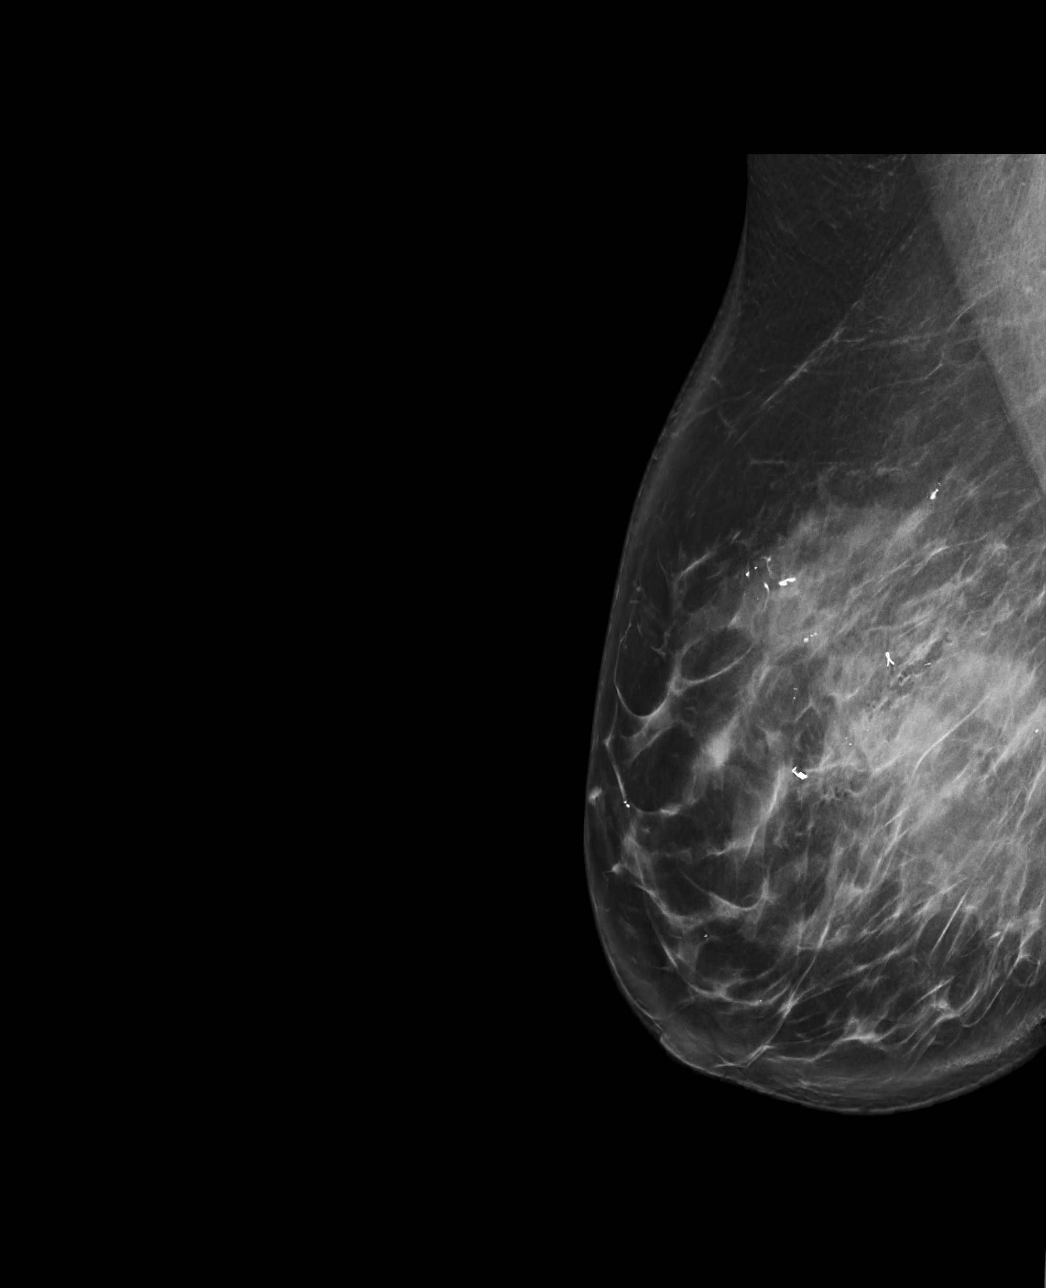

[R ML tomo · tomo slice 51/101.0]
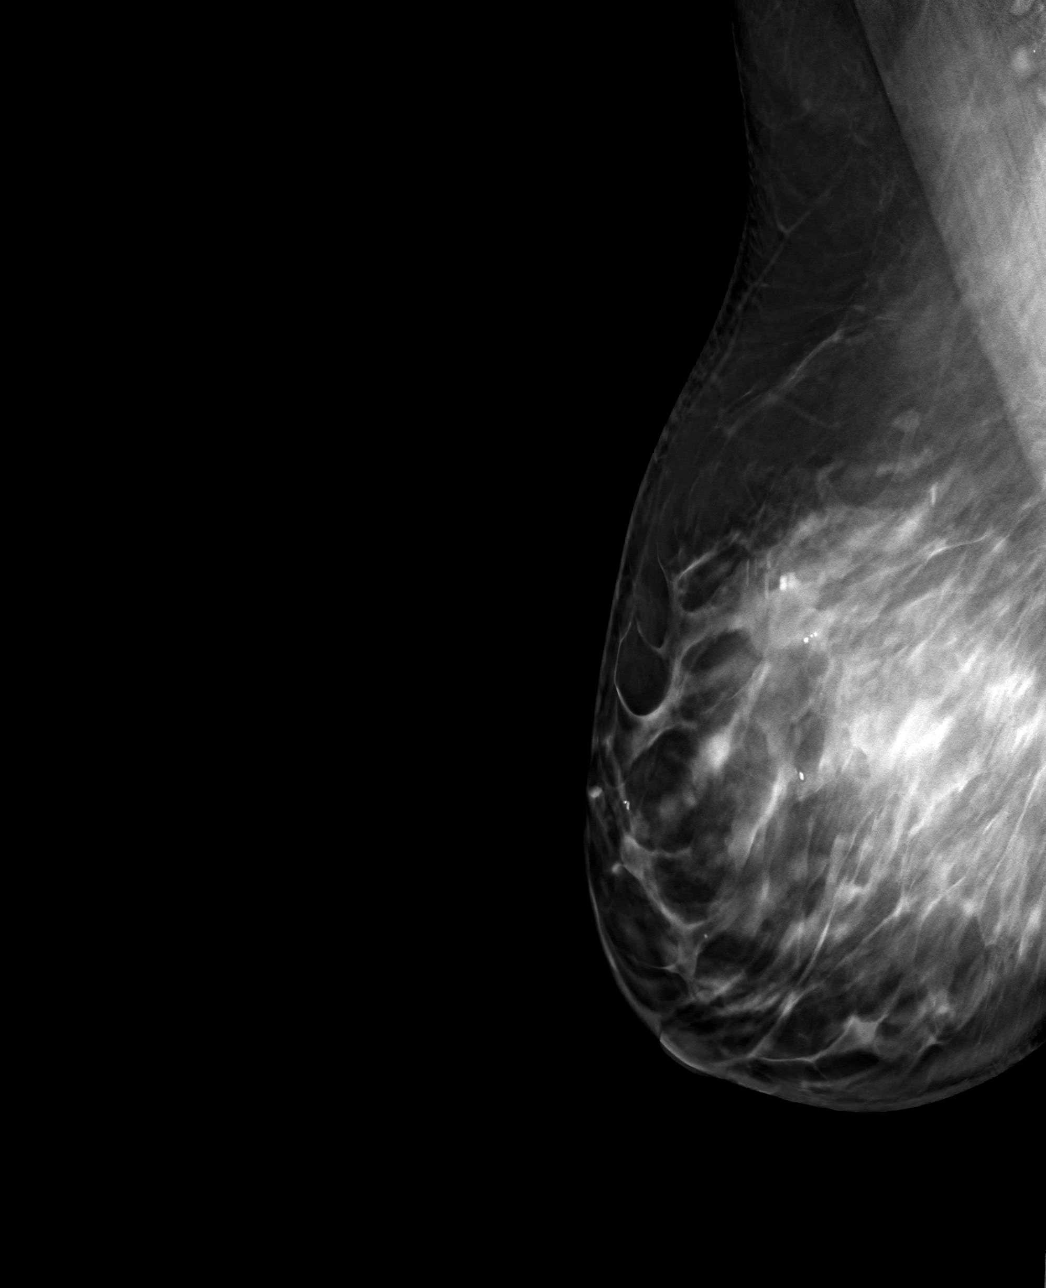

[R CC tomo · tomo slice 48/95.0]
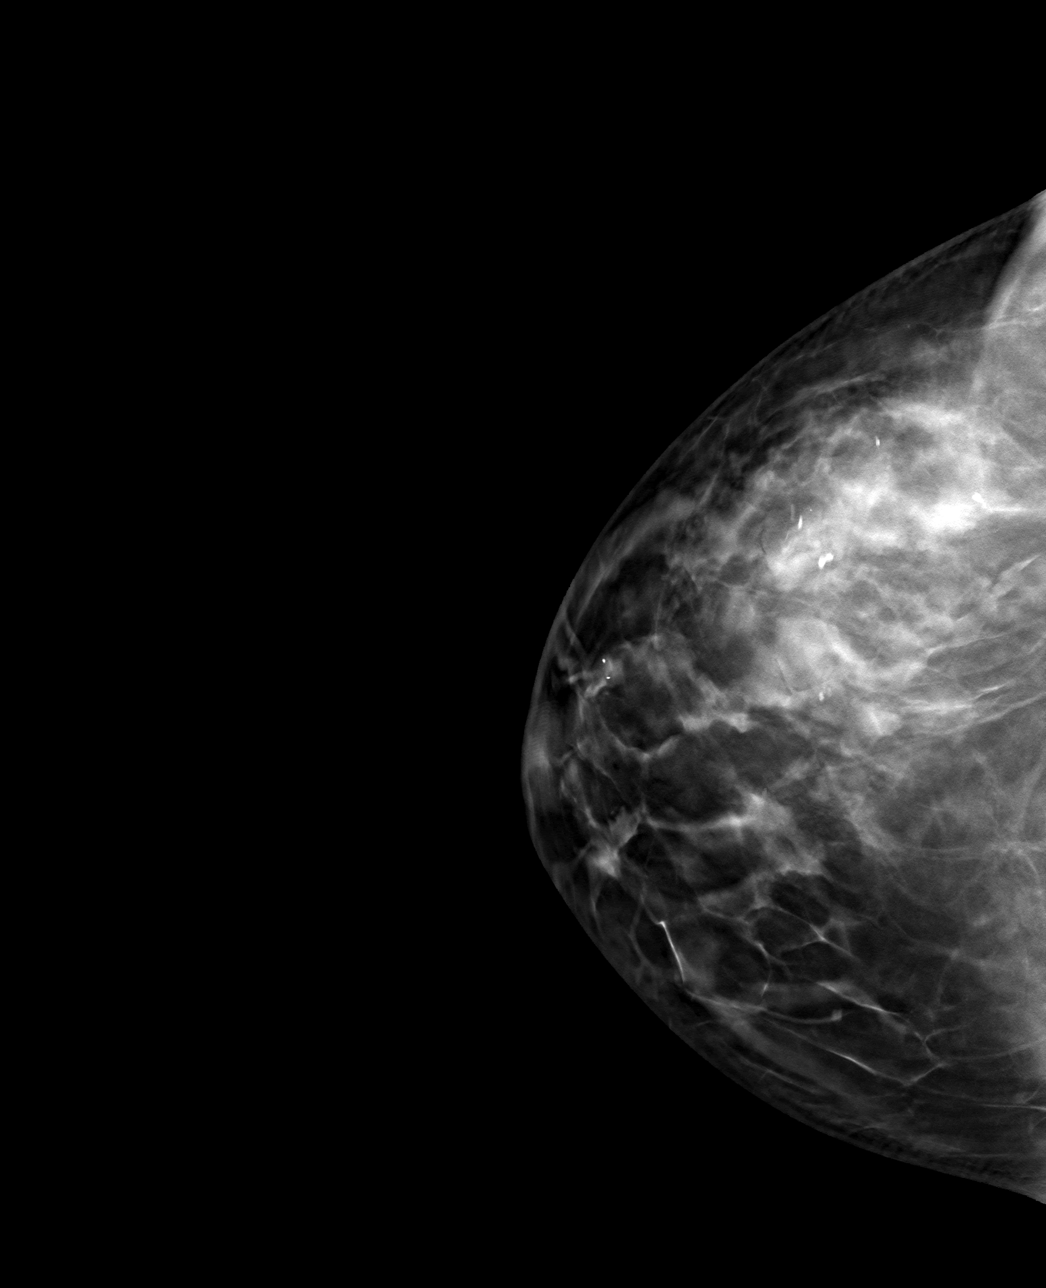

[4 of 12 positions shown; findings below may reference images not displayed]

FINDINGS: 2D and 3D full field CC and mediolateral images were obtained
following ultrasound guided biopsy of a vague hypoechoic mass/tissue
involving the UPPER OUTER QUADRANT of the RIGHT breast. The ribbon
shaped tissue marking clip is appropriately positioned at the site
of the biopsy at the 9:30 o'clock position.

Expected post biopsy changes are present without evidence of
hematoma.

While the ribbon clip is not in the exact location as the questioned
asymmetry on mammography, the biopsy was performed at the site of
the vague shadowing focus associated with palpable thickening.
IMPRESSION: Appropriate positioning of the ribbon shaped biopsy marking clip at
the site of biopsy in the UPPER OUTER QUADRANT of the RIGHT breast.

Final Assessment: Post Procedure Mammograms for Marker Placement

## 2021-07-31 ENCOUNTER — Other Ambulatory Visit: Payer: Self-pay

## 2021-07-31 ENCOUNTER — Ambulatory Visit
Admission: RE | Admit: 2021-07-31 | Discharge: 2021-07-31 | Disposition: A | Discharge status: Home / Self Care | Payer: BC Managed Care – PPO | Source: Ambulatory Visit | Attending: Surgery | Admitting: Surgery

## 2021-07-31 DIAGNOSIS — Z1239 Encounter for other screening for malignant neoplasm of breast: Secondary | ICD-10-CM

## 2021-07-31 DIAGNOSIS — N6452 Nipple discharge: Secondary | ICD-10-CM

## 2021-07-31 IMAGING — MR MR BREAST BILAT WO/W CM
8 of 13 series · 30 of 48 positions shown · IV contrast (gadavist)
Comparison: No prior MRI.

CLINICAL DATA: 50-year-old with recent diagnosis of intermediate
grade DCIS involving the UPPER OUTER QUADRANT of the RIGHT breast.
The patient presented with a spontaneous yellow and blood-tinged
RIGHT nipple discharge for several months. Pretreatment MRI is
performed to evaluate the extent of disease.

Personal history of excisional biopsy of atypical ductal hyperplasia
from the LEFT breast in [34].
EXAM:
BILATERAL BREAST MRI WITH AND WITHOUT CONTRAST
TECHNIQUE: Multiplanar, multisequence MR images of both breasts were obtained
prior to and following the intravenous administration of 8 ml of
Gadavist.

[Series 2: t2_tirm_tra ipat (a-p) · axial · 3.0mm · 0.70mm/px · 1 of 55 slices shown]
[im 1/55]
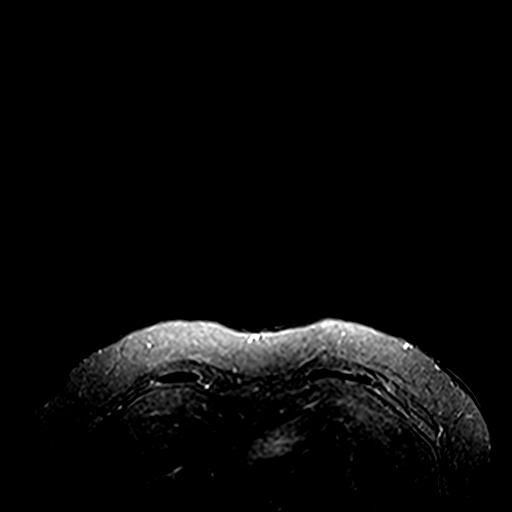

[Series 3: fl3d pre-cm no · axial · non-contrast · 1.2mm · 0.89mm/px · z∈[-109,+62]mm · 5 of 144 slices shown]
[im 1/144]
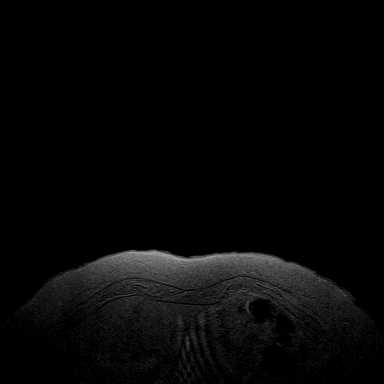
[im 36/144]
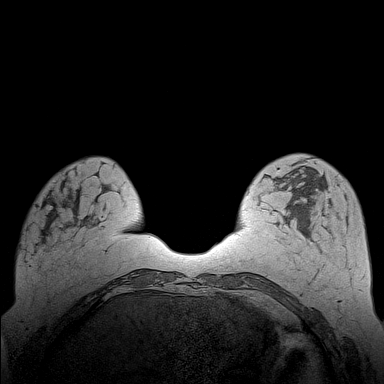
[im 72/144]
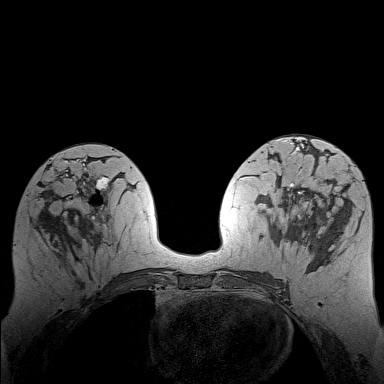
[im 108/144]
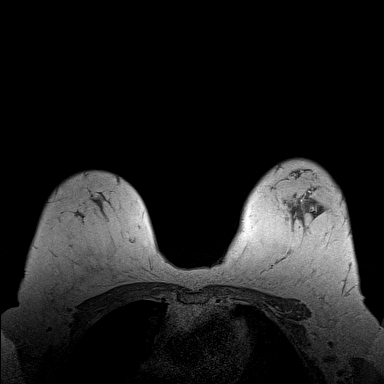
[im 144/144]
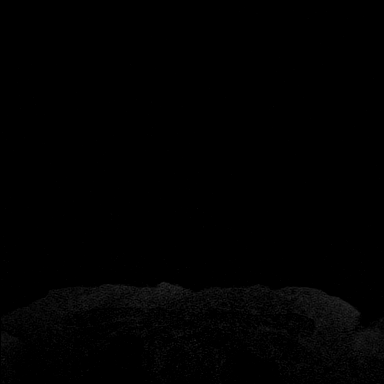

[Series 4: fl3d pre-cm · axial · non-contrast · 1.2mm · 0.89mm/px · z∈[-109,+62]mm · 5 of 144 slices shown]
[im 1/144]
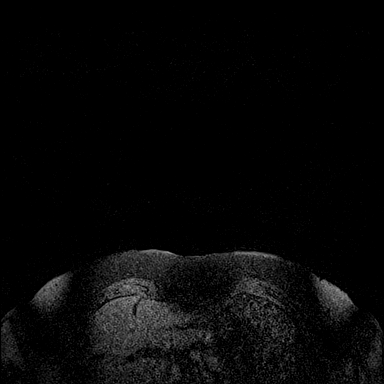
[im 36/144]
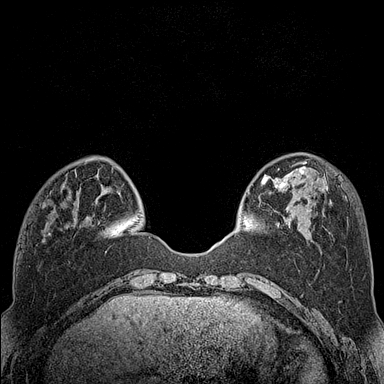
[im 72/144]
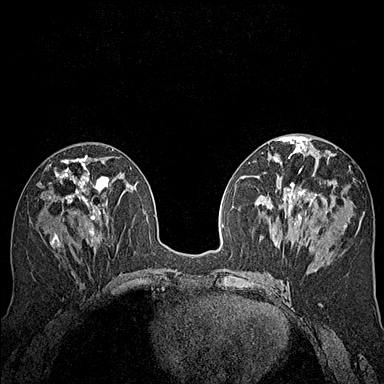
[im 108/144]
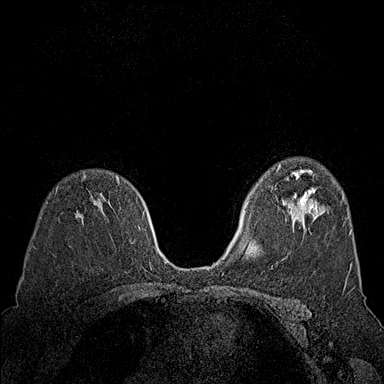
[im 144/144]
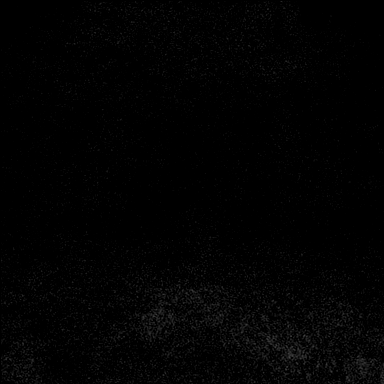

[Series 5: fl3d post-cm 20 · axial · 1.2mm · 0.89mm/px · z∈[-109,+62]mm · 5 of 144 slices shown (1 of 3)]
[im 1/144]
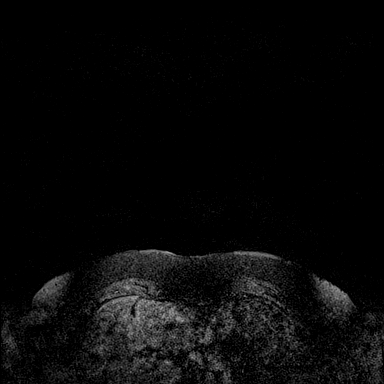
[im 36/144]
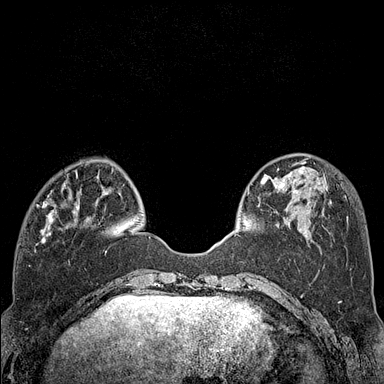
[im 72/144]
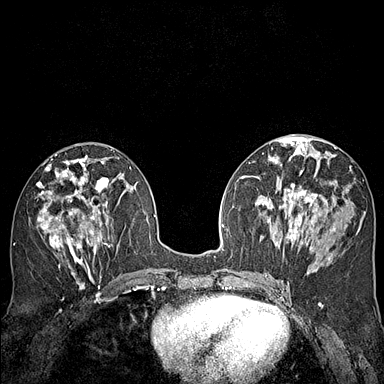
[im 108/144]
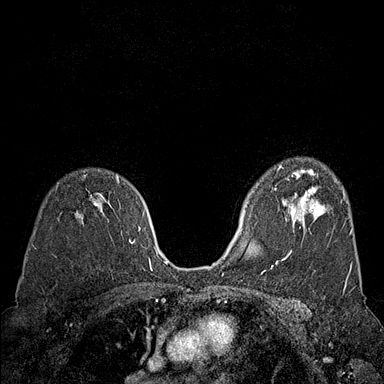
[im 144/144]
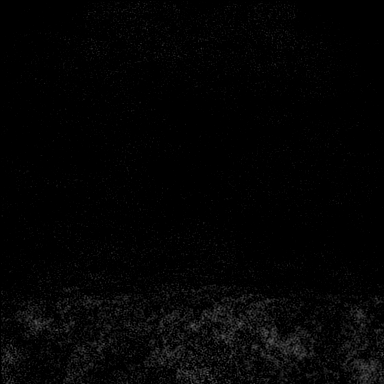

[Series 6: fl3d post-cm 20 · axial · 1.2mm · 0.89mm/px · z∈[-109,+62]mm · 5 of 144 slices shown (2 of 3)]
[im 1/144]
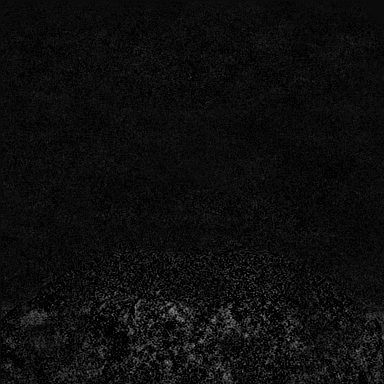
[im 36/144]
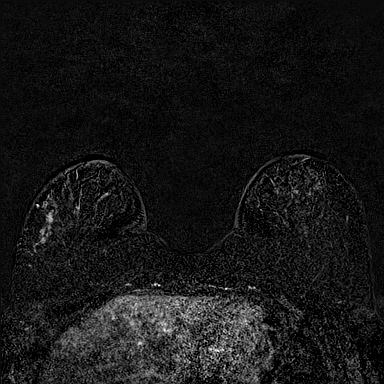
[im 72/144]
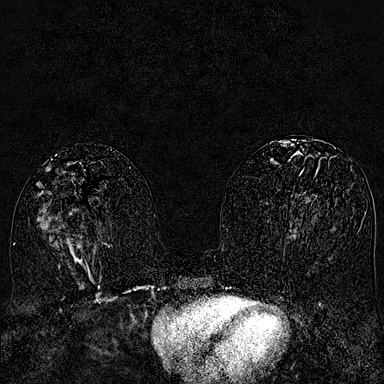
[im 108/144]
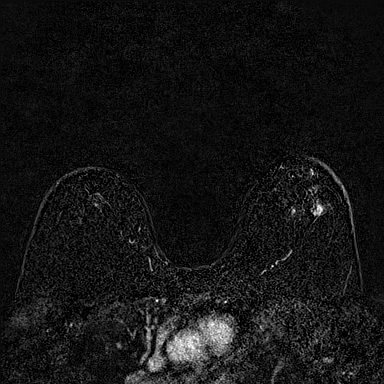
[im 144/144]
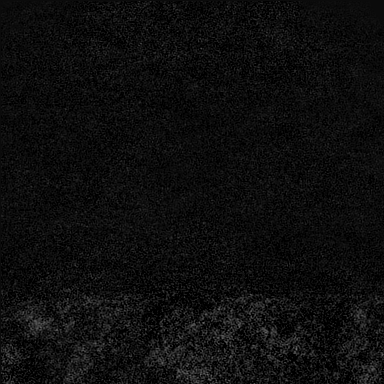

[Series 7: fl3d post-cm 20 · axial · 172.8mm · 0.89mm/px · 1 of 1 slices shown (3 of 3)]
[im 1/1]
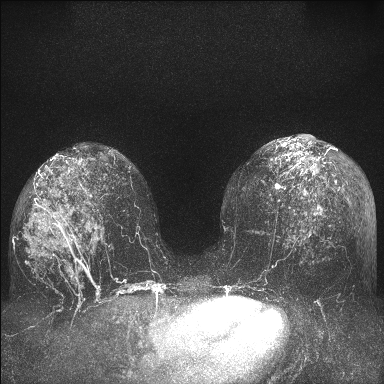

[Series 8: fl3d post-cm 3 · axial · 1.2mm · 0.89mm/px · z∈[-109,+62]mm · 5 of 144 slices shown (1 of 2)]
[im 1/144]
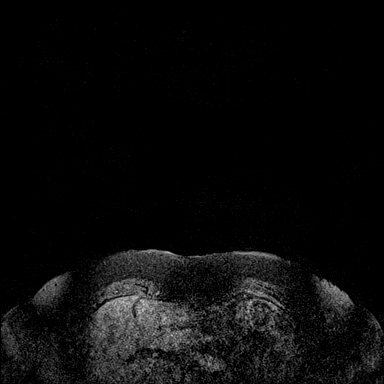
[im 36/144]
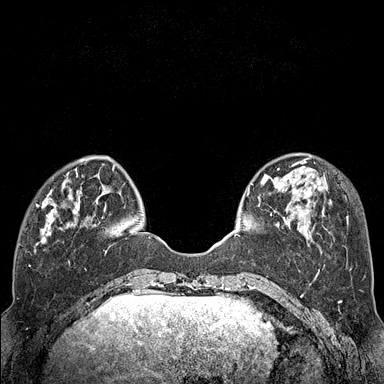
[im 72/144]
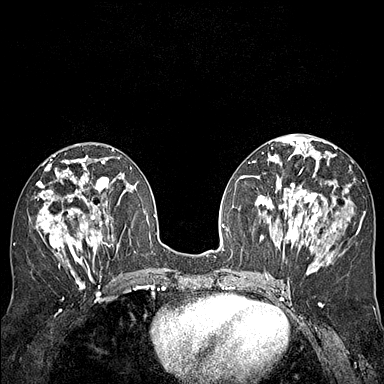
[im 108/144]
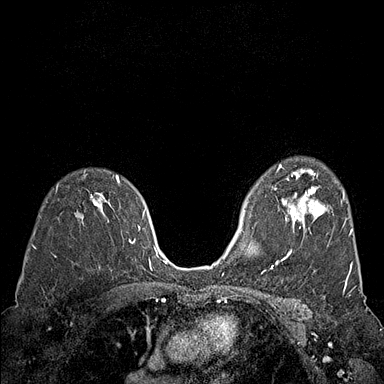
[im 144/144]
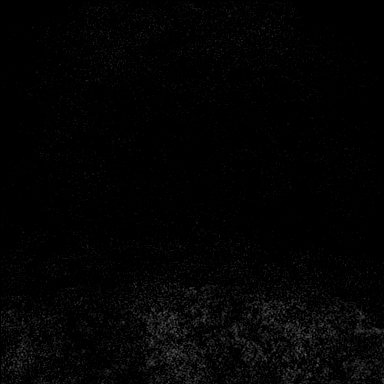

[Series 9: fl3d post-cm 3 · axial · 1.2mm · 0.89mm/px · z∈[-109,-41]mm · 3 of 144 slices shown (2 of 2)]
[im 1/144]
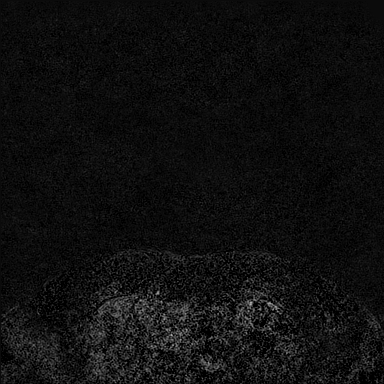
[im 29/144]
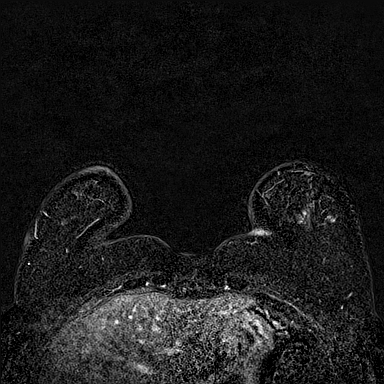
[im 58/144]
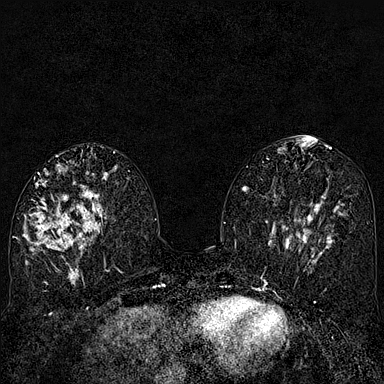

[30 of 48 positions shown; findings below may reference images not displayed]

Three-dimensional MR images were rendered by post-processing of the
original MR data on an independent workstation. The
three-dimensional MR images were interpreted, and findings are
reported in the following complete MRI report for this study. Three
dimensional images were evaluated at the independent interpreting
workstation using the DynaCAD thin client.
Multiple prior mammograms and RIGHT
breast ultrasound, most recently at the time of biopsy on
[DATE].
FINDINGS: Breast composition: c. Heterogeneous fibroglandular tissue.

Background parenchymal enhancement: Moderate to marked.

RIGHT breast: Clustered cysts in the UPPER OUTER QUADRANT with
scattered cysts elsewhere throughout the breast; the clustered cysts
in the upper outer quadrant correspond to the clustered cysts
identified on the ultrasound [DATE]. Some of the cysts are T2
hypointense and T1 hyperintense, indicating hemorrhagic and/or
proteinaceous cysts.

Extensive non-mass enhancement throughout the LOWER OUTER QUADRANT
from anterior to posterior depth, maximum measurements of
approximately 11.9 x 7.6 x 5.3 cm (AP x transverse x craniocaudal),
with minimal extension across the midline into the LOWER INNER
QUADRANT, demonstrating washout and plateau kinetics. The ribbon
shaped tissue marking clip is at the posterolateral margin of the
non-mass enhancement, and enhancement extends approximately 4.4 cm
posterior to the clip and at least 6.7 cm anterior to the clip.

LEFT breast: Multiple cysts throughout the breast, some of which are
T2 hypointense and T1 hyperintense, indicating hemorrhagic and/or
proteinaceous cysts. Architectural distortion involving the UPPER
INNER QUADRANT at middle depth at the site of prior excisional
biopsy.

Focal mass-like enhancement in the UPPER INNER QUADRANT at anterior
to middle depth measuring approximately 1.1 x 0.7 x 0.6 cm,
demonstrating washout and plateau kinetics.

No suspicious mass or abnormal enhancement elsewhere.

Lymph nodes: No pathologic lymphadenopathy.

Ancillary findings:  None.
IMPRESSION: 1. Extensive regional non-mass enhancement throughout the LOWER
OUTER QUADRANT of the RIGHT breast from anterior to posterior depth,
with minimal extension across the midline into the LOWER INNER
QUADRANT, spanning 11.9 x 7.6 x 5.3 cm. The biopsy-proven DCIS is at
the posterolateral margin of the non-mass enhancement. Abnormal
enhancement extends approximately 4.4 cm posterior and at least
cm anterior to the biopsy marker clip placed at the time of recent
biopsy.
2. Indeterminate 1.1 cm mass involving the UPPER INNER QUADRANT of
the LEFT breast at anterior to middle depth.
3. No pathologic lymphadenopathy.
4. Multiple BILATERAL breast cysts, including clustered cysts in the
UPPER OUTER QUADRANT of the RIGHT breast as seen on the recent
ultrasound.

RECOMMENDATION:
1. If breast sparing surgery is contemplated, or if neoadjuvant
chemotherapy is used, MRI guided biopsy of the anterior and
posterior extent of the regional non-mass enhancement in the LOWER
OUTER QUADRANT of the RIGHT breast is recommended.
2. In any case, MRI guided core needle biopsy of the indeterminate
LEFT breast mass is recommended.

BI-RADS CATEGORY  4: Suspicious.

## 2021-07-31 MED ORDER — GADOBUTROL 1 MMOL/ML IV SOLN
8.0000 mL | Freq: Once | INTRAVENOUS | Status: AC | PRN
  Administered 2021-07-31: 14:00:00 8 mL via INTRAVENOUS

## 2021-08-01 ENCOUNTER — Other Ambulatory Visit: Payer: BC Managed Care – PPO

## 2021-08-01 ENCOUNTER — Other Ambulatory Visit: Payer: Self-pay | Admitting: Surgery

## 2021-08-01 DIAGNOSIS — R9389 Abnormal findings on diagnostic imaging of other specified body structures: Secondary | ICD-10-CM

## 2021-08-02 ENCOUNTER — Ambulatory Visit
Admission: RE | Admit: 2021-08-02 | Discharge: 2021-08-02 | Disposition: A | Discharge status: Home / Self Care | Payer: BC Managed Care – PPO | Source: Ambulatory Visit | Attending: Surgery | Admitting: Surgery

## 2021-08-02 ENCOUNTER — Other Ambulatory Visit (HOSPITAL_COMMUNITY): Payer: Self-pay | Admitting: Diagnostic Radiology

## 2021-08-02 ENCOUNTER — Other Ambulatory Visit: Payer: Self-pay

## 2021-08-02 DIAGNOSIS — R9389 Abnormal findings on diagnostic imaging of other specified body structures: Secondary | ICD-10-CM

## 2021-08-02 HISTORY — PX: BREAST BIOPSY: SHX20

## 2021-08-02 IMAGING — MR MR BREAST BX W LOC DEV 1ST LESION IMAGE BX SPEC MR GUIDE*L*
6 of 8 series · 31 of 48 positions shown · IV contrast (gadavist)
Comparison: Previous exams.
COMPARISON: Previous exams.

Addendum:
CLINICAL DATA: Patient presents for MRI guided core needle biopsy
of the anterior and posterior extents of abnormal non mass
enhancement in the right breast and a 1.1 cm enhancing mass in the
left breast. Patient has recently diagnosed right breast DCIS.

EXAM:
MRI GUIDED CORE NEEDLE BIOPSY OF THE RIGHT AND LEFT BREAST
THREE MRI CORE NEEDLE BIOPSIES PERFORMED, 2 ON THE RIGHT AND 1 ON
THE LEFT.
TECHNIQUE: Multiplanar, multisequence MR imaging of the right and left breasts
was performed both before and after administration of intravenous
contrast.
CONTRAST:  8 mL of Gadavist

[Series 2: fiducial bilateral · sagittal · 2.0mm · 1.33mm/px · 6 of 221 slices shown]
[im 1/221]
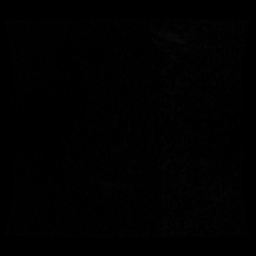
[im 45/221]
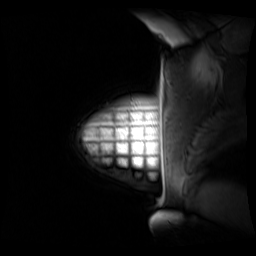
[im 89/221]
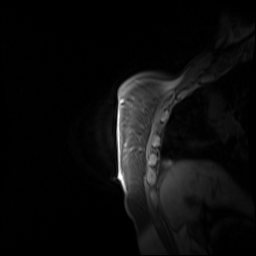
[im 133/221]
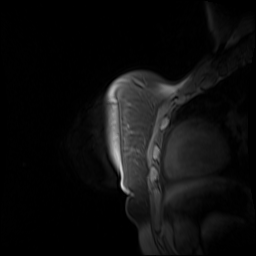
[im 177/221]
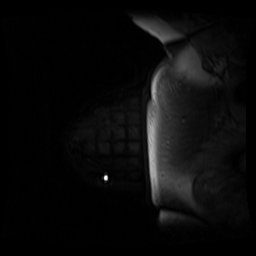
[im 221/221]
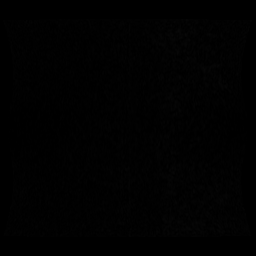

[Series 3: dynamic pre · axial · non-contrast · 1.3mm · 0.73mm/px · z∈[-136,+91]mm · 6 of 176 slices shown]
[im 1/176]
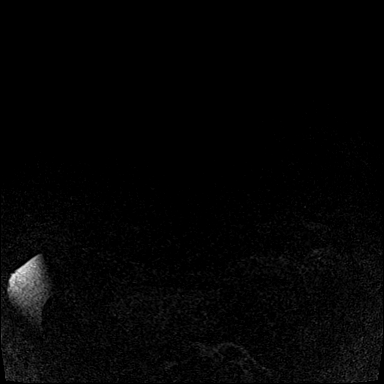
[im 36/176]
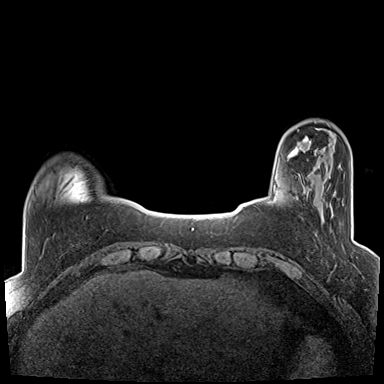
[im 71/176]
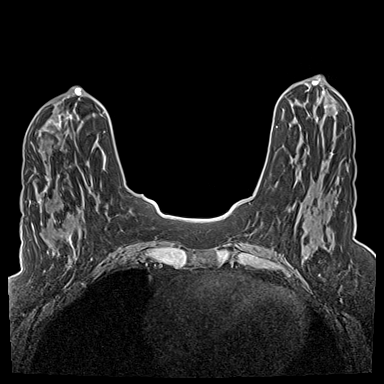
[im 106/176]
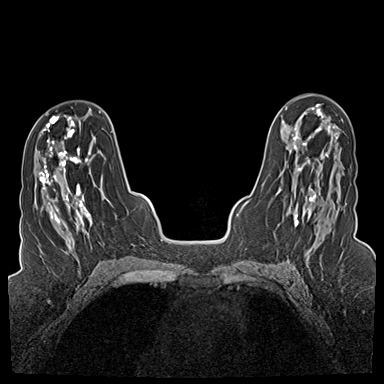
[im 141/176]
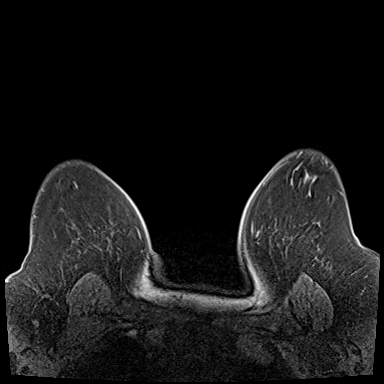
[im 176/176]
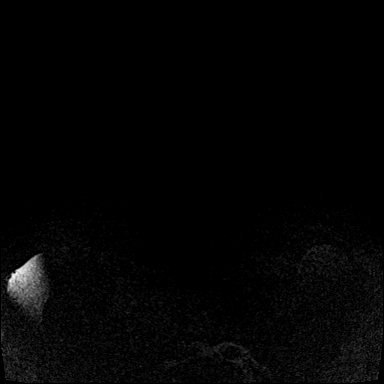

[Series 4: dynamic post 20 · axial · 1.3mm · 0.73mm/px · z∈[-136,+91]mm · 6 of 176 slices shown (1 of 2)]
[im 1/176]
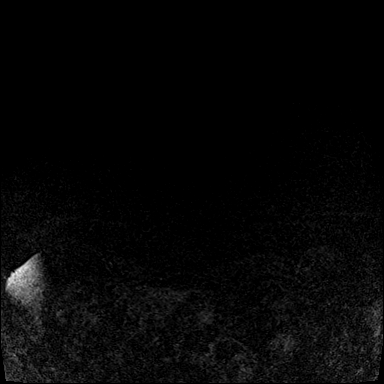
[im 36/176]
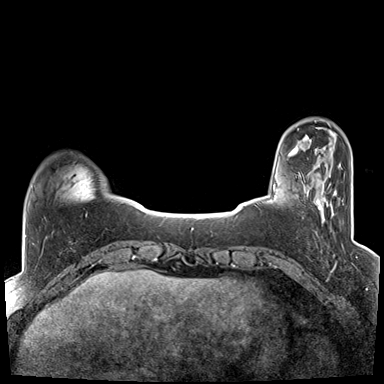
[im 71/176]
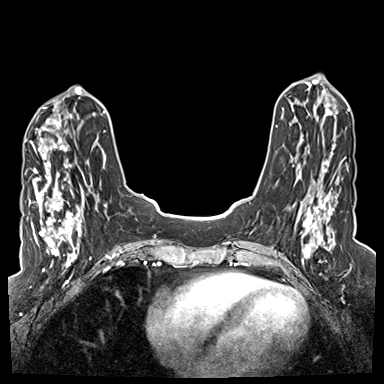
[im 106/176]
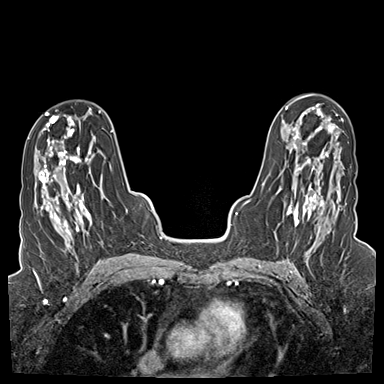
[im 141/176]
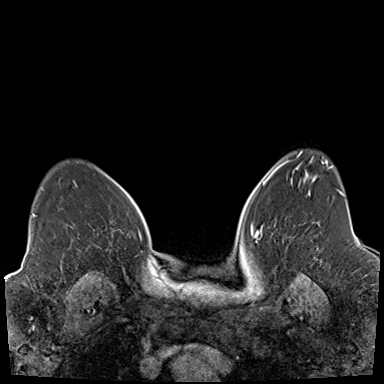
[im 176/176]
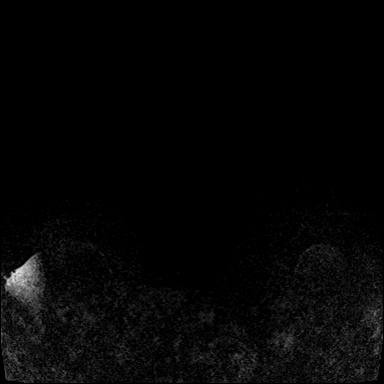

[Series 5: dynamic post 20 · axial · 1.3mm · 0.73mm/px · z∈[-136,+91]mm · 6 of 173 slices shown (2 of 2)]
[im 1/173]
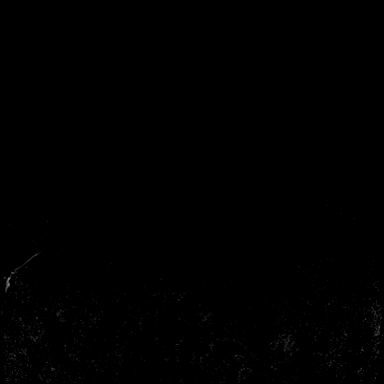
[im 35/173]
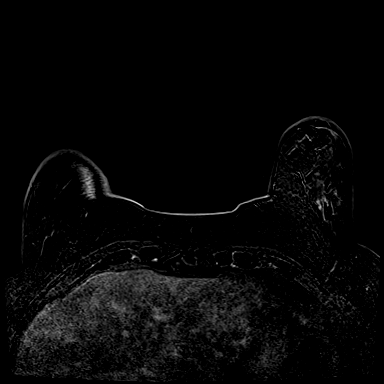
[im 69/173]
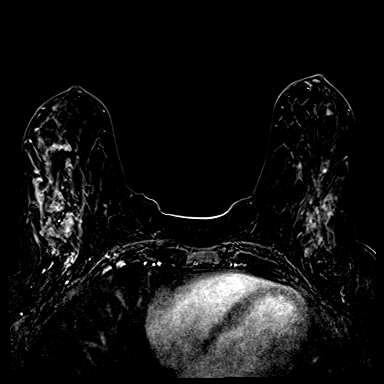
[im 104/173]
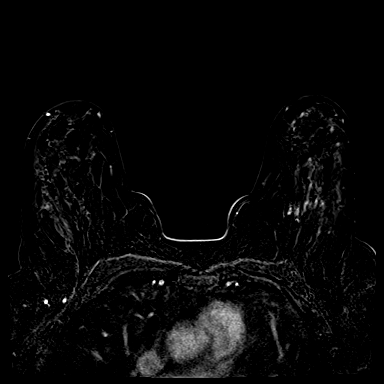
[im 138/173]
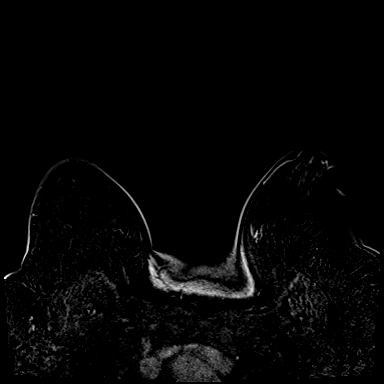
[im 173/173]
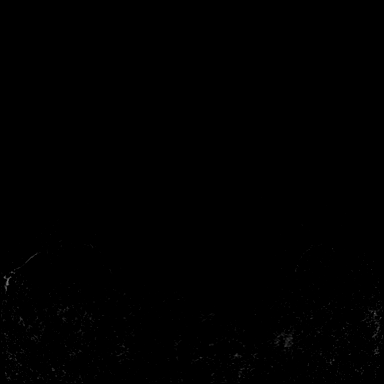

[Series 6: dynamic post 3 · axial · 1.3mm · 0.73mm/px · z∈[-136,+91]mm · 6 of 176 slices shown (1 of 2)]
[im 1/176]
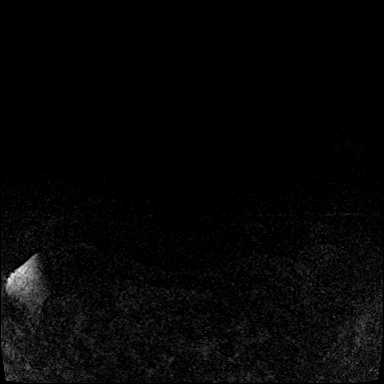
[im 36/176]
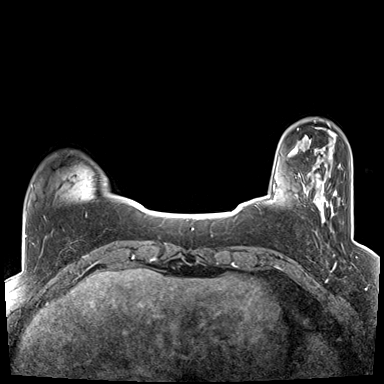
[im 71/176]
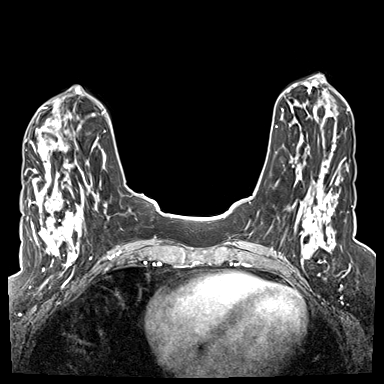
[im 106/176]
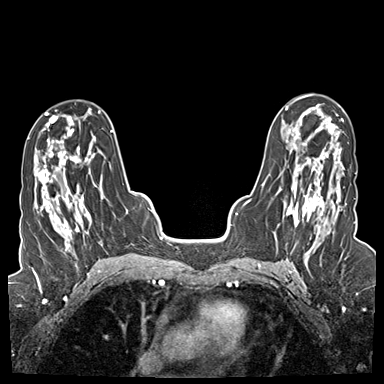
[im 141/176]
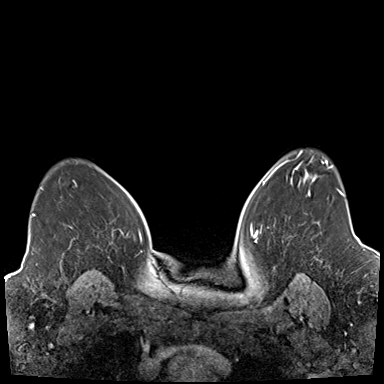
[im 176/176]
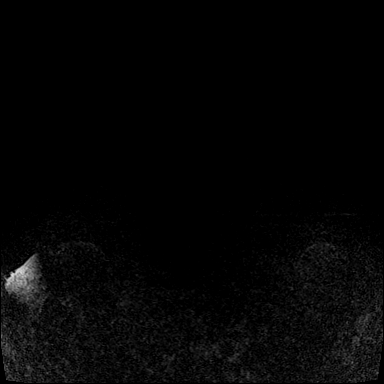

[Series 7: dynamic post 3 · axial · 1.3mm · 0.73mm/px · 1 of 176 slices shown (2 of 2)]
[im 1/176]
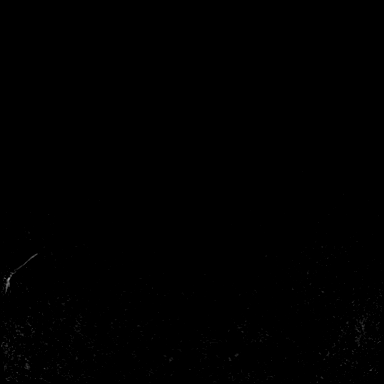

[31 of 48 positions shown; findings below may reference images not displayed]

FINDINGS: I met with the patient, and we discussed the procedure of MRI guided
biopsy, including risks, benefits, and alternatives. Specifically,
we discussed the risks of infection, bleeding, tissue injury, clip
migration, and inadequate sampling. Informed, written consent was
given. The usual time out protocol was performed immediately prior
to the procedure.

Biopsy #1: Right breast.  Lower quadrant, anterior.

Using sterile technique, 1% Lidocaine, MRI guidance, and a 9 gauge
vacuum assisted device, biopsy was performed of the anterior extent
of right breast non mass enhancement using a lateral approach. At
the conclusion of the procedure, a barbell shaped tissue marker clip
was deployed into the biopsy cavity.

Biopsy #3: Right breast, lower quadrant, posterior.

Using sterile technique, 1% Lidocaine, MRI guidance, and a 9 gauge
vacuum assisted device, biopsy was performed of posterior extent of
the non mass enhancement using a lateral approach. At the conclusion
of the procedure, a cylinder shaped tissue marker clip was deployed
into the biopsy cavity.

Biopsy #: Left breast, upper outer quadrant.

Using sterile technique, 1% Lidocaine, MRI guidance, and a 9 gauge
vacuum assisted device, biopsy was performed of the 1.1 cm enhancing
upper-outer quadrant mass using a lateral approach. At the
conclusion of the procedure, a a barbell shaped tissue marker clip
was deployed into the biopsy cavity.

Follow-up 2-view mammogram was performed and dictated separately.
IMPRESSION: MRI guided biopsy of the right breast, 2 sites biopsied.

MRI guided biopsy of the left breast, 1 site biopsied.

No apparent complications.

ADDENDUM:
Pathology revealed HIGH GRADE DUCTAL CARCINOMA IN SITU WITH
CALCIFICATIONS AND NECROSIS of the RIGHT breast, lower outer
quadrant,anterior depth, (barbell clip). The carcinoma in situ
appears to partially be involving a papillary lesion. This was found
to be concordant by Dr. EDU LUIS.

Pathology revealed DUCTAL CARCINOMA IN SITU WITH CALCIFICATIONS of
the RIGHT breast, lower outer quadrant, posterior depth, (cylinder
clip). This was found to be concordant by Dr. EDU LUIS.

Pathology revealed INTERMEDIATE GRADE DUCTAL CARCINOMA IN SITU of
the LEFT breast, upper outer quadrant, (barbell clip). This was
found to be concordant by Dr. EDU LUIS.

Pathology results were discussed with the patient by telephone. The
patient reported doing well after the biopsies with tenderness at
the sites, BILATERAL bruising and LEFT breast bleeding. Post biopsy
instructions and care were reviewed and questions were answered. The
patient was encouraged to call [REDACTED] for any additional concerns. My direct phone number was
provided.

The patient has a recent diagnosis of RIGHT breast cancer, from
biopsy on [DATE], and should follow her outlined treatment
plan.

Surgical consultation has been arranged with Dr. EDU LUIS at
[REDACTED] on [DATE].

Dr. EDU LUIS was notified of biopsy results via [REDACTED] message on EDU LUIS

Pathology results reported by EDU LUIS, RN on [DATE].

*** End of Addendum ***
FINDINGS: I met with the patient, and we discussed the procedure of MRI guided
biopsy, including risks, benefits, and alternatives. Specifically,
we discussed the risks of infection, bleeding, tissue injury, clip
migration, and inadequate sampling. Informed, written consent was
given. The usual time out protocol was performed immediately prior
to the procedure.

Biopsy #1: Right breast.  Lower quadrant, anterior.

Using sterile technique, 1% Lidocaine, MRI guidance, and a 9 gauge
vacuum assisted device, biopsy was performed of the anterior extent
of right breast non mass enhancement using a lateral approach. At
the conclusion of the procedure, a barbell shaped tissue marker clip
was deployed into the biopsy cavity.

Biopsy #3: Right breast, lower quadrant, posterior.

Using sterile technique, 1% Lidocaine, MRI guidance, and a 9 gauge
vacuum assisted device, biopsy was performed of posterior extent of
the non mass enhancement using a lateral approach. At the conclusion
of the procedure, a cylinder shaped tissue marker clip was deployed
into the biopsy cavity.

Biopsy #: Left breast, upper outer quadrant.

Using sterile technique, 1% Lidocaine, MRI guidance, and a 9 gauge
vacuum assisted device, biopsy was performed of the 1.1 cm enhancing
upper-outer quadrant mass using a lateral approach. At the
conclusion of the procedure, a a barbell shaped tissue marker clip
was deployed into the biopsy cavity.

Follow-up 2-view mammogram was performed and dictated separately.
IMPRESSION: MRI guided biopsy of the right breast, 2 sites biopsied.

MRI guided biopsy of the left breast, 1 site biopsied.

No apparent complications.

## 2021-08-02 IMAGING — MG MM BREAST LOCALIZATION CLIP
4 series · 4 of 12 positions shown · non-contrast
Comparison: Previous exam(s).

CLINICAL DATA: Status post MRI guided core needle biopsies 2 areas
in the right breast and 1 lesion in the left breast. Evaluate post
biopsy marker clip placements.

EXAM:
3D DIAGNOSTIC BILATERAL MAMMOGRAM POST MRI BIOPSY

[L ML synth-2D]
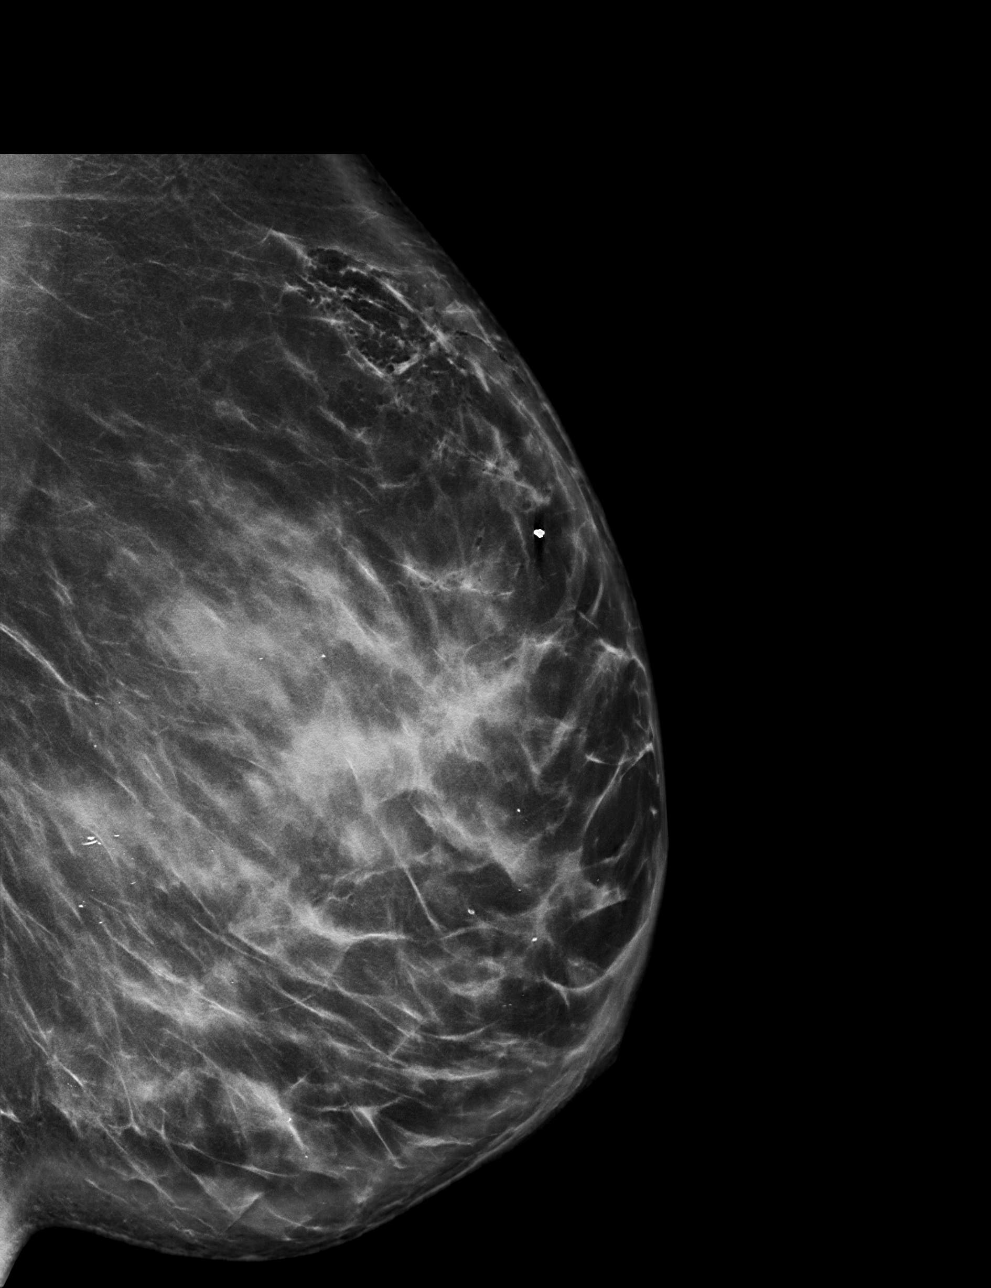

[L CC synth-2D]
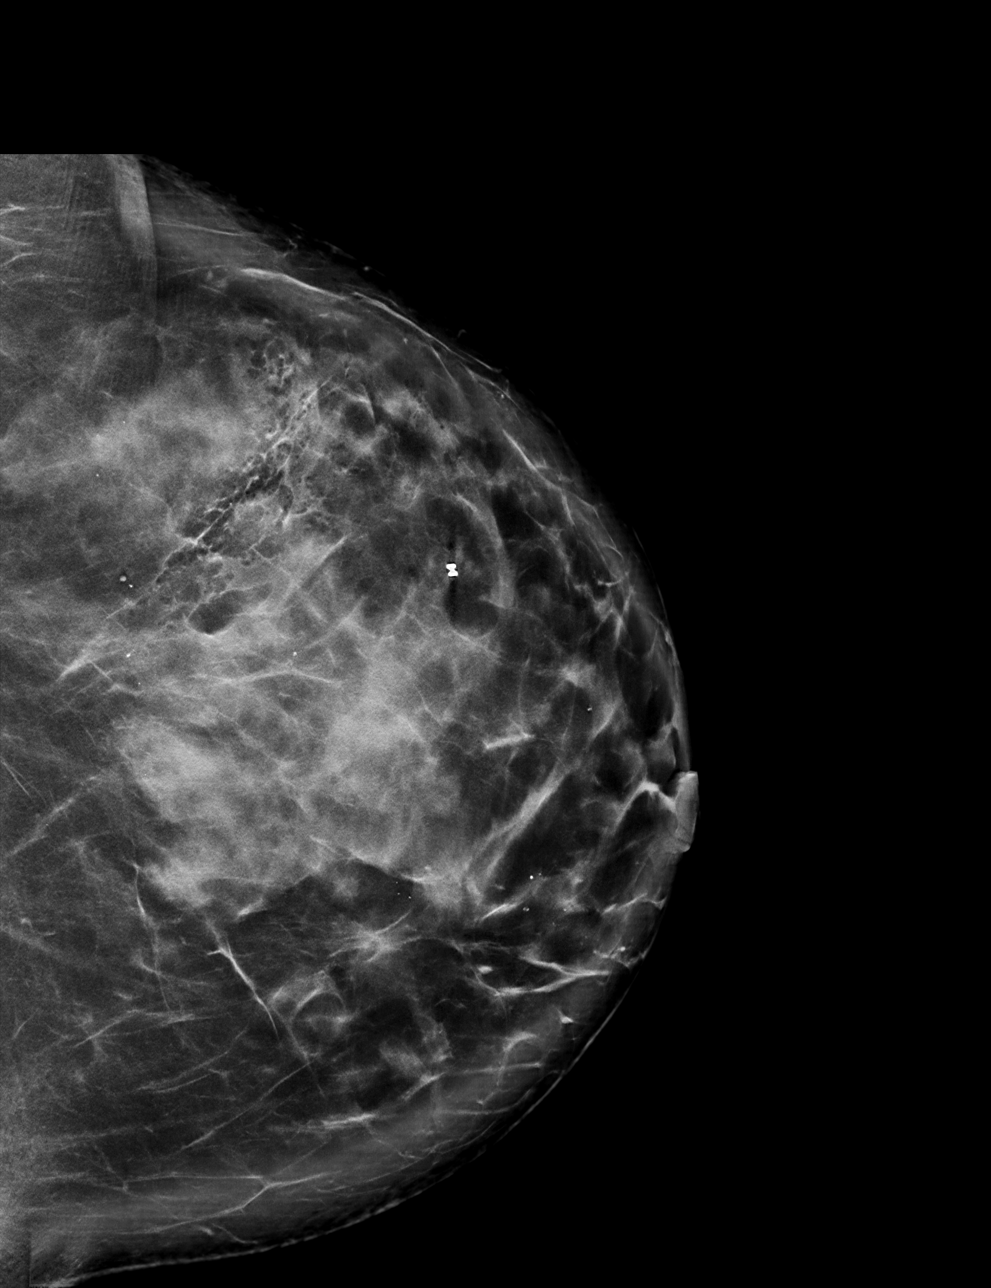

[L ML tomo · tomo slice 54/107.0]
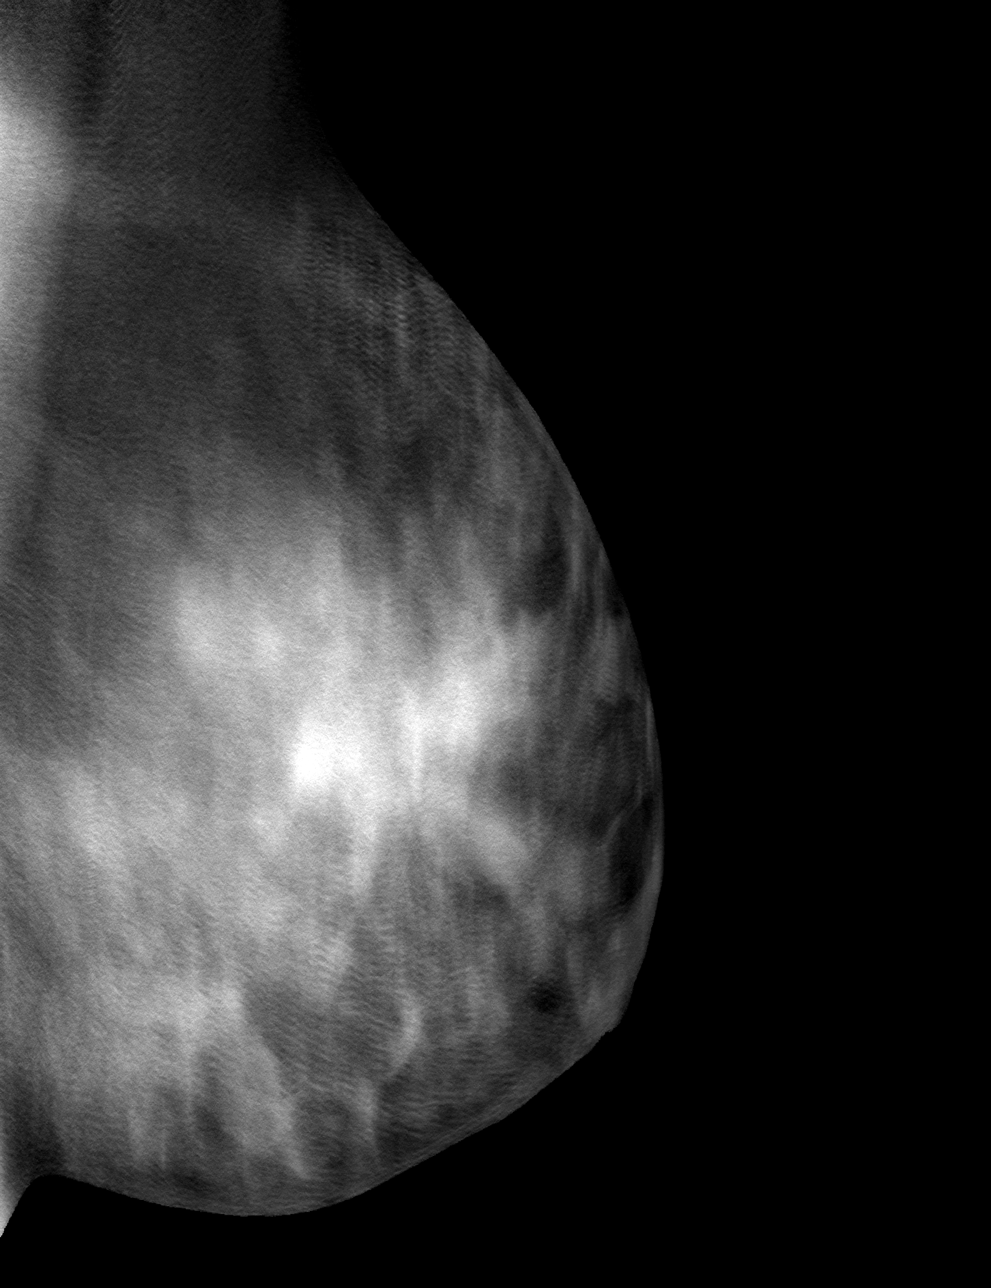

[L CC tomo · tomo slice 57/113.0]
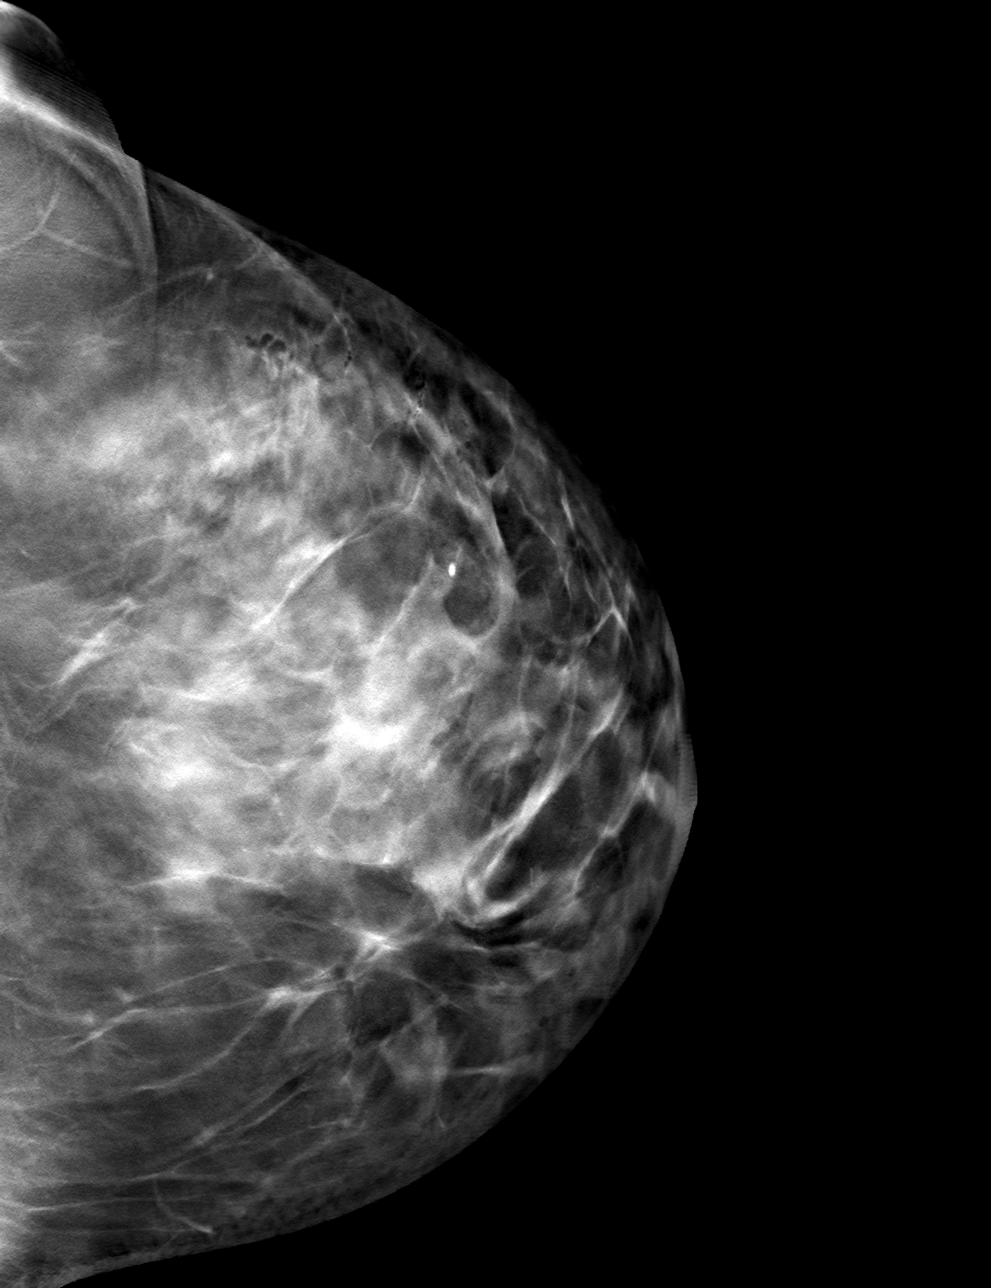

[4 of 12 positions shown; findings below may reference images not displayed]

FINDINGS: 3D Mammographic images were obtained following MRI guided biopsy of
the right and left breasts.

On the right, both the barbell and cylinder shaped biopsy clips lie
in the expected locations of the anterior-posterior extents of the
lower outer quadrant non mass enhancement.

On the left, the barbell shaped biopsy clip lies in the expected
location of the 1.1 cm enhancing mass, in the upper outer quadrant.
IMPRESSION: Appropriate positioning of the barbell and cylinder shaped biopsy
marking clips at the site of biopsy in the right breast.

Appropriate positioning of the barbell shaped biopsy marking clip at
the site of biopsy in the left breast.

Final Assessment: Post Procedure Mammograms for Marker Placement

## 2021-08-02 MED ORDER — GADOBUTROL 1 MMOL/ML IV SOLN
8.0000 mL | Freq: Once | INTRAVENOUS | Status: AC | PRN
  Administered 2021-08-02: 8 mL via INTRAVENOUS

## 2021-08-06 ENCOUNTER — Telehealth: Payer: Self-pay | Admitting: Hematology and Oncology

## 2021-08-06 ENCOUNTER — Ambulatory Visit: Payer: Self-pay | Admitting: General Surgery

## 2021-08-06 NOTE — Telephone Encounter (Signed)
Scheduled appt per 12/20 staff msg from [facility]. Pt is aware of appt date and time.

## 2021-08-07 ENCOUNTER — Other Ambulatory Visit: Payer: BC Managed Care – PPO

## 2021-08-09 ENCOUNTER — Encounter: Payer: Self-pay | Admitting: Plastic Surgery

## 2021-08-09 ENCOUNTER — Ambulatory Visit: Payer: BC Managed Care – PPO | Admitting: Plastic Surgery

## 2021-08-09 ENCOUNTER — Other Ambulatory Visit: Payer: Self-pay

## 2021-08-09 VITALS — BP 122/82 | HR 72 | Ht 65.5 in | Wt 180.0 lb

## 2021-08-09 DIAGNOSIS — C50811 Malignant neoplasm of overlapping sites of right female breast: Secondary | ICD-10-CM

## 2021-08-09 DIAGNOSIS — N6099 Unspecified benign mammary dysplasia of unspecified breast: Secondary | ICD-10-CM | POA: Diagnosis not present

## 2021-08-09 DIAGNOSIS — G4733 Obstructive sleep apnea (adult) (pediatric): Secondary | ICD-10-CM

## 2021-08-09 DIAGNOSIS — D0511 Intraductal carcinoma in situ of right breast: Secondary | ICD-10-CM | POA: Insufficient documentation

## 2021-08-09 NOTE — Progress Notes (Signed)
Patient ID: Diana Tate, female    DOB: May 29, 1971, 50 y.o.   MRN: [id number]   Chief Complaint  Patient presents with   Consult        Breast Cancer    The patient is a 50 year old female here with her husband for a consultation for breast reconstruction.  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  It is estrogen and progesterone positive.  An MRI was done which showed several other lesions in the right lower outer quadrant and upper inner quadrant of the left breast. She does not have diabetes and is not a smoker.  She is 5 feet 5 inches tall and weighs 180 pounds.  Her preoperative bra size is a DD.  She would like to be a C to D cup after the surgery.  She currently has grade III ptosis of her breasts.  She also has some bruising from the biopsies.  She does not plan on having radiation or chemo.  She had a lumpectomy in 2013 but it was benign.  Her general surgeon is Dr. Marlou Starks.   Review of Systems  Constitutional: Negative.   Eyes: Negative.   Respiratory: Negative.  Negative for chest tightness.   Cardiovascular: Negative.  Negative for leg swelling.  Gastrointestinal: Negative.   Endocrine: Negative.   Genitourinary: Negative.   Musculoskeletal: Negative.   Skin:  Positive for color change.       Bruising of the breast  Psychiatric/Behavioral: Negative.     Past Medical History:  Diagnosis Date   GERD (gastroesophageal reflux disease)    Hyperlipidemia    Migraine    Classic    Past Surgical History:  Procedure Laterality Date   BREAST BIOPSY Right 08/02/2021   2 areas   BREAST BIOPSY Left 08/02/2021   BREAST EXCISIONAL BIOPSY Right 04/2012   BREAST SURGERY     benign   CHOLECYSTECTOMY  2006   ENDOMETRIAL ABLATION  2007   ESOPHAGOGASTRODUODENOSCOPY N/A 05/22/2014   SLF: 1. Schatzki ring was found 2. MIld non-erosive gastritis   TONSILLECTOMY  2000      Current Outpatient  Medications:    Cholecalciferol (VITAMIN D) 50 MCG (2000 UT) tablet, Take 5,000 Units by mouth daily., Disp: , Rfl:    Cyanocobalamin (VITAMIN B 12) 500 MCG TABS, Take 1 tablet by mouth daily., Disp: , Rfl:    pantoprazole (PROTONIX) 20 MG tablet, Take 20 mg by mouth daily., Disp: , Rfl:    Objective:   Vitals:   08/09/21 1245  BP: 122/82  Pulse: 72  SpO2: 98%    Physical Exam Vitals and nursing note reviewed.  Constitutional:      Appearance: Normal appearance.  HENT:     Head: Normocephalic and atraumatic.  Cardiovascular:     Rate and Rhythm: Normal rate.     Pulses: Normal pulses.  Pulmonary:     Effort: Pulmonary effort is normal. No respiratory distress.  Abdominal:     General: There is no distension.     Palpations: Abdomen is soft.     Tenderness: There is no abdominal tenderness.  Musculoskeletal:        General: Swelling present. No deformity.     Cervical back: Normal range of motion.  Skin:    General: Skin is warm.     Capillary Refill: Capillary refill takes less than 2 seconds.     Coloration: Skin  is not jaundiced.     Findings: Bruising present.  Neurological:     Mental Status: She is alert and oriented to person, place, and time.  Psychiatric:        Mood and Affect: Mood normal.        Behavior: Behavior normal.        Thought Content: Thought content normal.        Judgment: Judgment normal.    Assessment & Plan:  Obstructive sleep apnea  Atypical ductal hyperplasia of breast  Malignant neoplasm of overlapping sites of right female breast, unspecified estrogen receptor status ([place])  The options for reconstruction we explained to the patient / family for breast reconstruction.  There are two general categories of reconstruction.  We can reconstruction a breast with implants or use the patient's own tissue.  These were further discussed as listed.  Breast reconstruction is an optional procedure and eligibility depends on the full spectrum of  the health of the patient and any co-morbidities.  More than one surgery is often needed to complete the reconstruction process.  The process can take three to twelve months to complete.  The breasts will not be identical due to many factors such as rib differences, shoulder asymmetry and treatments such as radiation.  The goal is to get the breasts to look normal and symmetrical in clothes.  Scars are a part of surgery and may fade some in time but will always be present under clothes.  Surgery may be an option on the non-cancer breast to achieve more symmetry.  No matter which procedure is chosen there is always the risk of complications and even failure of the body to heal.  This could result in no breast.    The options for reconstruction include:  1. Placement of a tissue expander with Acellular dermal matrix. When the expander is the desired size surgery is performed to remove the expander and place an implant.  In some cases the implant can be placed without an expander.  2. Autologous reconstruction can include using a muscle or tissue from another area of the body to create a breast.  3. Combined procedures (ie. latissismus dorsi flap) can be done with an expander / implant placed under the muscle.   The risks, benefits, scars and recovery time were discussed for each of the above. Risks include bleeding, infection, hematoma, seroma, scarring, pain, wound healing complications, flap loss, fat necrosis, capsular contracture, need for implant removal, donor site complications, bulge, hernia, umbilical necrosis, need for urgent reoperation, and need for dressing changes.   The procedure the patient selected / that was best for the patient, was then discussed in further detail.  Total time: 45 minutes. This includes time spent with the patient during the visit as well as time spent before and after the visit reviewing the chart, documenting the encounter, making phone calls and reviewing studies.    We reviewed the options for breast reconstruction as listed above.  The patient wants to go with implant-based reconstruction.  We will not try to salvage the nipples due to the ptosis.  I have also talked to Dr. Marlou Starks and he is aware of the above information and in agreement.  Pictures were obtained of the patient and placed in the chart with the patient's or guardian's permission.   [place], DO

## 2021-08-21 ENCOUNTER — Ambulatory Visit: Payer: BC Managed Care – PPO

## 2021-08-21 ENCOUNTER — Ambulatory Visit: Payer: BC Managed Care – PPO | Admitting: Radiation Oncology

## 2021-08-21 NOTE — Progress Notes (Incomplete)
New Breast Cancer Diagnosis: Right Breast- UOQ, LOQ  Did patient present with symptoms (if so, please note symptoms) or screening mammography?:Nipple Bleeding or Discharge occasionally over the last year.  She also notes mild right breast pain.   MRI Breast: 12 cm area of enhancement in the lower outer quadrant of the right breast as well as a 1.1 cm mass in the upper inner quadrant of the left breast.   Location and Extent of disease :right breast. Located in the lower outer quadrant and upper outer quadrant, measured 0.4 cm in greatest dimension. Adenopathy no.  Histology per Pathology Report: grade High, DCIS with calcifications and necrosis.  Receptor Status: ER(positive), PR (positive), Her2-neu (), Ki-(%)  Plastic Surgery plan, if any: Dr. Marla Roe 08/09/2021 -We reviewed the options for breast reconstruction as listed above.  The patient wants to go with implant-based reconstruction.  We will not try to salvage the nipples due to the ptosis.  I have also talked to Dr. Marlou Starks and he is aware of the above information and in agreement.   Surgeon and surgical plan, if any:  Dr. 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 The path on this is still pending. She also has a smaller 1.1 cm mass in the upper inner quadrant of the left breast. Given all of these findings she is favoring bilateral mastectomies with reconstruction. -We will refer her to Dr. Marla Roe in plastic surgery to consider reconstruction.  -We will refer her to medical and radiation oncology to discuss adjuvant therapy.  -We will refer her to physical therapy for preoperative evaluation. We will call her with the rest of her pathology once it is back -Bilateral Total Mastectomy 09/16/2021    Medical oncologist, treatment if any:   Dr.  Lindi Adie 08/27/2021   Family History of Breast/Ovarian/Prostate Cancer:   Lymphedema issues, if any:      Pain issues, if any:     SAFETY ISSUES: Prior radiation?  Pacemaker/ICD?  Possible current pregnancy? Ablation Is the patient on methotrexate?   Current Complaints / other details:

## 2021-08-22 ENCOUNTER — Ambulatory Visit: Payer: BC Managed Care – PPO

## 2021-08-22 ENCOUNTER — Ambulatory Visit
Admission: RE | Admit: 2021-08-22 | Payer: BC Managed Care – PPO | Source: Ambulatory Visit | Admitting: Radiation Oncology

## 2021-08-26 ENCOUNTER — Encounter: Payer: Self-pay | Admitting: Family Medicine

## 2021-08-26 NOTE — H&P (View-Only) (Signed)
Patient ID: Diana Tate, female    DOB: [birth year], 51 y.o.   MRN: [id number]  Chief Complaint  Patient presents with   Pre-op Exam      ICD-10-CM   1. Atypical ductal hyperplasia of breast  N60.99        History of Present Illness: Diana Tate is a 50 y.o.  female  with a history of right breast DCIS.  She presents for preoperative evaluation for upcoming procedure, immediate bilateral breast reconstruction with tissue expanders, scheduled for 09/16/2021 with Dr. Marla Roe.  The patient has not had problems with anesthesia.  She has had previous TNA and cholecystectomy without any complication from anesthesia.  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  She also endorses allergies to tape adhesives/Band-Aids.  She believes that she is okay with gauze and Steri-Strips.  She confirms that she would prefer to be a D cup after reconstruction.  She denies any personal or family history of blood clots or clotting disorder.  She is not on any hormone medication.  She denies any changes in her interim health.  No recent fevers, chest pain, shortness of breath, or other symptoms.  Summary of Previous Visit: She was seen for initial consult by Dr. Marla Roe on 08/09/2021.  At that time, was noted that she was recently diagnosed with breast cancer.  Preoperative bra size equals DD cup and patient expressed that she would like to remain a C or D cup after surgery.  Plan is for bilateral total mastectomy by Dr. Marlou Starks followed with immediate reconstruction using tissue expanders.  PMH Significant for: Breast cancer, OSA, GERD.   Past Medical History: Allergies: Allergies  Allergen Reactions   Tape Dermatitis   Lexapro [Escitalopram Oxalate] Nausea Only    Current Medications:  Current Outpatient Medications:    cephALEXin (KEFLEX) 500 MG capsule, Take 1 capsule (500 mg total) by mouth 4 (four) times daily for 3 days., Disp: 12 capsule, Rfl: 0   Cholecalciferol  (VITAMIN D) 50 MCG (2000 UT) tablet, Take 5,000 Units by mouth daily., Disp: , Rfl:    Cyanocobalamin (VITAMIN B 12) 500 MCG TABS, Take 1 tablet by mouth daily., Disp: , Rfl:    diazepam (VALIUM) 2 MG tablet, Take 1 tablet (2 mg total) by mouth every 12 (twelve) hours as needed for muscle spasms., Disp: 20 tablet, Rfl: 0   ondansetron (ZOFRAN-ODT) 4 MG disintegrating tablet, Take 1 tablet (4 mg total) by mouth every 8 (eight) hours as needed for nausea or vomiting., Disp: 20 tablet, Rfl: 0   oxyCODONE (ROXICODONE) 5 MG immediate release tablet, Take 1 tablet (5 mg total) by mouth every 6 (six) hours as needed for up to 5 days for severe pain., Disp: 20 tablet, Rfl: 0   pantoprazole (PROTONIX) 20 MG tablet, Take 20 mg by mouth daily., Disp: , Rfl:   Past Medical Problems: Past Medical History:  Diagnosis Date   GERD (gastroesophageal reflux disease)    Hyperlipidemia    Migraine    Classic    Past Surgical History: Past Surgical History:  Procedure Laterality Date   BREAST BIOPSY Right 08/02/2021   2 areas   BREAST BIOPSY Left 08/02/2021   BREAST EXCISIONAL BIOPSY Right 04/2012   BREAST SURGERY     benign   CHOLECYSTECTOMY  2006   ENDOMETRIAL ABLATION  2007   ESOPHAGOGASTRODUODENOSCOPY N/A 05/22/2014   SLF: 1. Schatzki ring was found 2. MIld non-erosive gastritis   TONSILLECTOMY  2000  Social History: Social History   Socioeconomic History   Marital status: Married    Spouse name: Not on file   Number of children: 2   Years of education: Not on file   Highest education level: Not on file  Occupational History   Occupation: Retail banker: [employer]  Tobacco Use   Smoking status: Former    Packs/day: 1.00    Years: 24.00    Pack years: 24.00    Types: Cigarettes   Smokeless tobacco: Never   Tobacco comments:    Quit x 2 years  Substance and Sexual Activity   Alcohol use: No    Alcohol/week: 0.0 standard drinks    Comment: occ    Drug use: No   Sexual activity: Yes  Other Topics Concern   Not on file  Social History Narrative   Not on file   Social Determinants of Health   Financial Resource Strain: Not on file  Food Insecurity: Not on file  Transportation Needs: Not on file  Physical Activity: Not on file  Stress: Not on file  Social Connections: Not on file  Intimate Partner Violence: Not on file    Family History: Family History  Problem Relation Age of Onset   Hypertension Mother    Cancer Maternal Uncle        lung   Cancer Maternal Grandfather        skin   Diabetes Other        second degree relative   Colon cancer Neg Hx    Esophageal cancer Neg Hx     Review of Systems: ROS Denies recent fevers, chills, infection, trauma, chest pain, or shortness of breath.  Physical Exam: Vital Signs BP 115/77 (BP Location: Left Arm, Patient Position: Sitting, Cuff Size: Large)    Pulse 80    Ht 5' 5.5" (1.664 m)    Wt 184 lb 9.6 oz (83.7 kg)    SpO2 97%    BMI 30.25 kg/m   Physical Exam Constitutional:      General: Not in acute distress.    Appearance: Normal appearance. Not ill-appearing.  HENT:     Head: Normocephalic and atraumatic.  Eyes:     Pupils: Pupils are equal, round. Cardiovascular:     Rate and Rhythm: Normal rate.    Pulses: Normal pulses.  Pulmonary:     Effort: No respiratory distress or increased work of breathing.  Speaks in full sentences. Abdominal:     General: Abdomen is flat. No distension.   Musculoskeletal: Normal range of motion. No lower extremity swelling or edema. No varicosities.  Skin:    General: Skin is warm and dry.     Findings: No erythema or rash.  Neurological:     Mental Status: Alert and oriented to person, place, and time.  Psychiatric:        Mood and Affect: Mood normal.        Behavior: Behavior normal.    Assessment/Plan: The patient is scheduled for immediate bilateral breast reconstruction with tissue expander placement with Dr.  Marla Roe.  Risks, benefits, and alternatives of procedure discussed, questions answered and consent obtained.    Smoking Status: Prior smoker. Last Mammogram: 07/31/2021; Results: Multiple masses including biopsy-proven DCIS right breast, 1.1 cm mass left upper inner quadrant.  Multiple bilateral breast cysts.  Caprini Score: 6; Risk Factors include: Age, BMI greater than 25, breast cancer, and length of planned surgery. Recommendation for mechanical  prophylaxis. Encourage early ambulation.   Pictures obtained: 08/09/2021  Post-op Rx sent to pharmacy: Roxicodone, Zofran, Keflex, Valium.  Roxicodone because she does not like Norco.  Denies true allergy to narcotic analgesia.  Patient was provided with the General Surgical Risk consent document and Pain Medication Agreement prior to their appointment.  They had adequate time to read through the risk consent documents and Pain Medication Agreement. We also discussed them in person together during this preop appointment. All of their questions were answered to their satisfaction.  Recommended calling if they have any further questions.  Risk consent form and Pain Medication Agreement to be scanned into patient's chart.  The risks that can be encountered with and after placement of a breast expander placement were discussed and include the following but not limited to these: bleeding, infection, delayed healing, anesthesia risks, skin sensation changes, injury to structures including nerves, blood vessels, and muscles which may be temporary or permanent, allergies to tape, suture materials and glues, blood products, topical preparations or injected agents, skin contour irregularities, skin discoloration and swelling, deep vein thrombosis, cardiac and pulmonary complications, pain, which may persist, fluid accumulation, wrinkling of the skin over the expander, changes in nipple or breast sensation, expander leakage or rupture, faulty position of the  expander, persistent pain, formation of tight scar tissue around the expander (capsular contracture), possible need for revisional surgery or staged procedures.    Electronically signed by: Krista Blue, PA-C 08/28/2021 4:20 PM

## 2021-08-26 NOTE — Progress Notes (Signed)
Patient ID: Diana Tate, female    DOB: 12-Jul-1971, 51 y.o.   MRN: [id number]  Chief Complaint  Patient presents with   Pre-op Exam      ICD-10-CM   1. 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  female  with a history of right breast DCIS.  She presents for preoperative evaluation for upcoming procedure, immediate bilateral breast reconstruction with tissue expanders, scheduled for 09/16/2021 with Dr. Marla Roe.  The patient has not had problems with anesthesia.  She has had previous TNA and cholecystectomy without any complication from anesthesia.  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  She also endorses allergies to tape adhesives/Band-Aids.  She believes that she is okay with gauze and Steri-Strips.  She confirms that she would prefer to be a D cup after reconstruction.  She denies any personal or family history of blood clots or clotting disorder.  She is not on any hormone medication.  She denies any changes in her interim health.  No recent fevers, chest pain, shortness of breath, or other symptoms.  Summary of Previous Visit: She was seen for initial consult by Dr. Marla Roe on 08/09/2021.  At that time, was noted that she was recently diagnosed with breast cancer.  Preoperative bra size equals DD cup and patient expressed that she would like to remain a C or D cup after surgery.  Plan is for bilateral total mastectomy by Dr. Marlou Starks followed with immediate reconstruction using tissue expanders.  PMH Significant for: Breast cancer, OSA, GERD.   Past Medical History: Allergies: Allergies  Allergen Reactions   Tape Dermatitis   Lexapro [Escitalopram Oxalate] Nausea Only    Current Medications:  Current Outpatient Medications:    cephALEXin (KEFLEX) 500 MG capsule, Take 1 capsule (500 mg total) by mouth 4 (four) times daily for 3 days., Disp: 12 capsule, Rfl: 0   Cholecalciferol  (VITAMIN D) 50 MCG (2000 UT) tablet, Take 5,000 Units by mouth daily., Disp: , Rfl:    Cyanocobalamin (VITAMIN B 12) 500 MCG TABS, Take 1 tablet by mouth daily., Disp: , Rfl:    diazepam (VALIUM) 2 MG tablet, Take 1 tablet (2 mg total) by mouth every 12 (twelve) hours as needed for muscle spasms., Disp: 20 tablet, Rfl: 0   ondansetron (ZOFRAN-ODT) 4 MG disintegrating tablet, Take 1 tablet (4 mg total) by mouth every 8 (eight) hours as needed for nausea or vomiting., Disp: 20 tablet, Rfl: 0   oxyCODONE (ROXICODONE) 5 MG immediate release tablet, Take 1 tablet (5 mg total) by mouth every 6 (six) hours as needed for up to 5 days for severe pain., Disp: 20 tablet, Rfl: 0   pantoprazole (PROTONIX) 20 MG tablet, Take 20 mg by mouth daily., Disp: , Rfl:   Past Medical Problems: Past Medical History:  Diagnosis Date   GERD (gastroesophageal reflux disease)    Hyperlipidemia    Migraine    Classic    Past Surgical History: Past Surgical History:  Procedure Laterality Date   BREAST BIOPSY Right 08/02/2021   2 areas   BREAST BIOPSY Left 08/02/2021   BREAST EXCISIONAL BIOPSY Right 04/2012   BREAST SURGERY     benign   CHOLECYSTECTOMY  2006   ENDOMETRIAL ABLATION  2007   ESOPHAGOGASTRODUODENOSCOPY N/A 05/22/2014   SLF: 1. Schatzki ring was found 2. MIld non-erosive gastritis   TONSILLECTOMY  2000  Social History: Social History   Socioeconomic History   Marital status: Married    Spouse name: Not on file   Number of children: 2   Years of education: Not on file   Highest education level: Not on file  Occupational History   Occupation: Retail banker: [employer]  Tobacco Use   Smoking status: Former    Packs/day: 1.00    Years: 24.00    Pack years: 24.00    Types: Cigarettes   Smokeless tobacco: Never   Tobacco comments:    Quit x 2 years  Substance and Sexual Activity   Alcohol use: No    Alcohol/week: 0.0 standard drinks    Comment: occ    Drug use: No   Sexual activity: Yes  Other Topics Concern   Not on file  Social History Narrative   Not on file   Social Determinants of Health   Financial Resource Strain: Not on file  Food Insecurity: Not on file  Transportation Needs: Not on file  Physical Activity: Not on file  Stress: Not on file  Social Connections: Not on file  Intimate Partner Violence: Not on file    Family History: Family History  Problem Relation Age of Onset   Hypertension Mother    Cancer Maternal Uncle        lung   Cancer Maternal Grandfather        skin   Diabetes Other        second degree relative   Colon cancer Neg Hx    Esophageal cancer Neg Hx     Review of Systems: ROS Denies recent fevers, chills, infection, trauma, chest pain, or shortness of breath.  Physical Exam: Vital Signs BP 115/77 (BP Location: Left Arm, Patient Position: Sitting, Cuff Size: Large)    Pulse 80    Ht 5' 5.5" (1.664 m)    Wt 184 lb 9.6 oz (83.7 kg)    SpO2 97%    BMI 30.25 kg/m   Physical Exam Constitutional:      General: Not in acute distress.    Appearance: Normal appearance. Not ill-appearing.  HENT:     Head: Normocephalic and atraumatic.  Eyes:     Pupils: Pupils are equal, round. Cardiovascular:     Rate and Rhythm: Normal rate.    Pulses: Normal pulses.  Pulmonary:     Effort: No respiratory distress or increased work of breathing.  Speaks in full sentences. Abdominal:     General: Abdomen is flat. No distension.   Musculoskeletal: Normal range of motion. No lower extremity swelling or edema. No varicosities.  Skin:    General: Skin is warm and dry.     Findings: No erythema or rash.  Neurological:     Mental Status: Alert and oriented to person, place, and time.  Psychiatric:        Mood and Affect: Mood normal.        Behavior: Behavior normal.    Assessment/Plan: The patient is scheduled for immediate bilateral breast reconstruction with tissue expander placement with Dr.  Marla Roe.  Risks, benefits, and alternatives of procedure discussed, questions answered and consent obtained.    Smoking Status: Prior smoker. Last Mammogram: 07/31/2021; Results: Multiple masses including biopsy-proven DCIS right breast, 1.1 cm mass left upper inner quadrant.  Multiple bilateral breast cysts.  Caprini Score: 6; Risk Factors include: Age, BMI greater than 25, breast cancer, and length of planned surgery. Recommendation for mechanical  prophylaxis. Encourage early ambulation.   Pictures obtained: 08/09/2021  Post-op Rx sent to pharmacy: Roxicodone, Zofran, Keflex, Valium.  Roxicodone because she does not like Norco.  Denies true allergy to narcotic analgesia.  Patient was provided with the General Surgical Risk consent document and Pain Medication Agreement prior to their appointment.  They had adequate time to read through the risk consent documents and Pain Medication Agreement. We also discussed them in person together during this preop appointment. All of their questions were answered to their satisfaction.  Recommended calling if they have any further questions.  Risk consent form and Pain Medication Agreement to be scanned into patient's chart.  The risks that can be encountered with and after placement of a breast expander placement were discussed and include the following but not limited to these: bleeding, infection, delayed healing, anesthesia risks, skin sensation changes, injury to structures including nerves, blood vessels, and muscles which may be temporary or permanent, allergies to tape, suture materials and glues, blood products, topical preparations or injected agents, skin contour irregularities, skin discoloration and swelling, deep vein thrombosis, cardiac and pulmonary complications, pain, which may persist, fluid accumulation, wrinkling of the skin over the expander, changes in nipple or breast sensation, expander leakage or rupture, faulty position of the  expander, persistent pain, formation of tight scar tissue around the expander (capsular contracture), possible need for revisional surgery or staged procedures.    Electronically signed by: Krista Blue, PA-C 08/28/2021 4:20 PM

## 2021-08-27 ENCOUNTER — Inpatient Hospital Stay: Payer: BC Managed Care – PPO | Attending: Hematology and Oncology | Admitting: Hematology and Oncology

## 2021-08-27 ENCOUNTER — Other Ambulatory Visit: Payer: Self-pay

## 2021-08-27 DIAGNOSIS — N6452 Nipple discharge: Secondary | ICD-10-CM | POA: Insufficient documentation

## 2021-08-27 DIAGNOSIS — Z87891 Personal history of nicotine dependence: Secondary | ICD-10-CM | POA: Diagnosis not present

## 2021-08-27 DIAGNOSIS — D0511 Intraductal carcinoma in situ of right breast: Secondary | ICD-10-CM | POA: Diagnosis present

## 2021-08-27 DIAGNOSIS — Z79899 Other long term (current) drug therapy: Secondary | ICD-10-CM | POA: Diagnosis not present

## 2021-08-27 NOTE — Progress Notes (Signed)
[facility] CONSULT NOTE  Patient Care Team: Kathyrn Drown, MD as PCP - General (Family Medicine) Fields, Marga Melnick, MD (Inactive) as Consulting Physician (Gastroenterology)  CHIEF COMPLAINTS/PURPOSE OF CONSULTATION:  Newly diagnosed bilateral DCIS  HISTORY OF PRESENTING ILLNESS:  Diana Tate 51 y.o. female is here because of recent diagnosis of bilateral DCIS.  Patient presented with a nipple discharge for about a month.  She had previous negative mammograms.  This led to mammogram and ultrasound which revealed abnormalities in the breast which led to biopsies and that came back as DCIS that was ER/PR positive.  She had a breast MRI that showed more extensive non-mass enhancement throughout the right breast as well as left breast 1.1 cm abnormality.  This led to discussion with Dr. Marlou Starks who recommended mastectomy and she decided to do bilateral mastectomies instead because she has had this frequent mammograms and ultrasounds and recalls for long period of time and she is tired of going back and forth.  I reviewed her records extensively and collaborated the history with the patient.  SUMMARY OF ONCOLOGIC HISTORY: Oncology History  Ductal carcinoma in situ (DCIS) of right breast  08/09/2021 Initial Diagnosis   Ductal carcinoma in situ (DCIS) of right breast      MEDICAL HISTORY:  Past Medical History:  Diagnosis Date   GERD (gastroesophageal reflux disease)    Hyperlipidemia    Migraine    Classic    SURGICAL HISTORY: Past Surgical History:  Procedure Laterality Date   BREAST BIOPSY Right 08/02/2021   2 areas   BREAST BIOPSY Left 08/02/2021   BREAST EXCISIONAL BIOPSY Right 04/2012   BREAST SURGERY     benign   CHOLECYSTECTOMY  2006   ENDOMETRIAL ABLATION  2007   ESOPHAGOGASTRODUODENOSCOPY N/A 05/22/2014   SLF: 1. Schatzki ring was found 2. MIld non-erosive gastritis   TONSILLECTOMY  2000    SOCIAL HISTORY: Social History   Socioeconomic  History   Marital status: Married    Spouse name: Not on file   Number of children: 2   Years of education: Not on file   Highest education level: Not on file  Occupational History   Occupation: Retail banker: [employer]  Tobacco Use   Smoking status: Former    Packs/day: 1.00    Years: 24.00    Pack years: 24.00    Types: Cigarettes   Smokeless tobacco: Never   Tobacco comments:    Quit x 2 years  Substance and Sexual Activity   Alcohol use: No    Alcohol/week: 0.0 standard drinks    Comment: occ   Drug use: No   Sexual activity: Yes  Other Topics Concern   Not on file  Social History Narrative   Not on file   Social Determinants of Health   Financial Resource Strain: Not on file  Food Insecurity: Not on file  Transportation Needs: Not on file  Physical Activity: Not on file  Stress: Not on file  Social Connections: Not on file  Intimate Partner Violence: Not on file    FAMILY HISTORY: Family History  Problem Relation Age of Onset   Hypertension Mother    Cancer Maternal Uncle        lung   Cancer Maternal Grandfather        skin   Diabetes Other        second degree relative   Colon cancer Neg Hx  Esophageal cancer Neg Hx     ALLERGIES:  is allergic to lexapro [escitalopram oxalate].  MEDICATIONS:  Current Outpatient Medications  Medication Sig Dispense Refill   Cholecalciferol (VITAMIN D) 50 MCG (2000 UT) tablet Take 5,000 Units by mouth daily.     Cyanocobalamin (VITAMIN B 12) 500 MCG TABS Take 1 tablet by mouth daily.     pantoprazole (PROTONIX) 20 MG tablet Take 20 mg by mouth daily.     No current facility-administered medications for this visit.    REVIEW OF SYSTEMS:   Constitutional: Denies fevers, chills or abnormal night sweats Eyes: Denies blurriness of vision, double vision or watery eyes Ears, nose, mouth, throat, and face: Denies mucositis or sore throat Respiratory: Denies cough, dyspnea or  wheezes Cardiovascular: Denies palpitation, chest discomfort or lower extremity swelling Gastrointestinal:  Denies nausea, heartburn or change in bowel habits Skin: Denies abnormal skin rashes Lymphatics: Denies new lymphadenopathy or easy bruising Neurological:Denies numbness, tingling or new weaknesses Behavioral/Psych: Mood is stable, no new changes  Breast: Nipple discharge right breast All other systems were reviewed with the patient and are negative.  PHYSICAL EXAMINATION: ECOG PERFORMANCE STATUS: 1 - Symptomatic but completely ambulatory  Vitals:   08/27/21 1559  BP: 120/66  Pulse: 73  Resp: 18  Temp: (!) 97.5 F (36.4 C)  SpO2: 98%   Filed Weights   08/27/21 1559  Weight: 185 lb 1.6 oz (84 kg)       LABORATORY DATA:  I have reviewed the data as listed Lab Results  Component Value Date   WBC 8.3 01/25/2021   HGB 13.2 01/25/2021   HCT 41.3 01/25/2021   MCV 87 01/25/2021   PLT 256 01/25/2021   Lab Results  Component Value Date   NA 139 01/25/2021   K 5.1 01/25/2021   CL 103 01/25/2021   CO2 22 01/25/2021    RADIOGRAPHIC STUDIES: I have personally reviewed the radiological reports and agreed with the findings in the report.  ASSESSMENT AND PLAN:  Ductal carcinoma in situ (DCIS) of right breast 08/02/2021: Mammogram was performed for spontaneous yellow to blood-tinged right nipple discharge for several months: Mammogram: Prominent calcifications and asymmetry, targeted ultrasound 10 o'clock position benign-appearing cysts, right breast 9:30 position heterogeneous mass measuring 3 x 1.1 x 2.4 cm  Breast MRI 07/31/2021: Extensive regional non-mass enhancement 11.9 cm, indeterminate 1.1 cm mass UIQ left breast: No lymph nodes  Biopsy 08/02/2021: Right breast LOQ anterior depth: High-grade DCIS with calcs and necrosis arising in a papillary lesion, right breast LOQ: DCIS, left breast UOQ: DCIS ER 95%, PR 100% on both sides  Pathology review: I discussed with  the patient the difference between DCIS and invasive breast cancer. It is considered a precancerous lesion. DCIS is classified as a 0. It is generally detected through mammograms as calcifications. We discussed the significance of grades and its impact on prognosis. We also discussed the importance of ER and PR receptors and their implications to adjuvant treatment options. Prognosis of DCIS dependence on grade, comedo necrosis. It is anticipated that if not treated, 20-30% of DCIS can develop into invasive breast cancer.  Recommendation: 1.  Bilateral mastectomies with reconstruction: If she has no evidence of invasive cancer then she does not need antiestrogen therapy. Surgery has been set up for 09/16/2021. Return to clinic after surgery to discuss the final pathology report   There is no evidence of invasive breast cancer she can be followed by long-term survivorship.   All questions were answered. The  patient knows to call the clinic with any problems, questions or concerns.    Harriette Ohara, MD 08/27/21

## 2021-08-27 NOTE — Assessment & Plan Note (Signed)
08/02/2021: Mammogram was performed for spontaneous yellow to blood-tinged right nipple discharge for several months: Mammogram: Prominent calcifications and asymmetry, targeted ultrasound 10 o'clock position benign-appearing cysts, right breast 9:30 position heterogeneous mass measuring 3 x 1.1 x 2.4 cm  Breast MRI 07/31/2021: Extensive regional non-mass enhancement 11.9 cm, indeterminate 1.1 cm mass UIQ left breast: No lymph nodes  Biopsy 08/02/2021: Right breast LOQ anterior depth: High-grade DCIS with calcs and necrosis arising in a papillary lesion, right breast LOQ: DCIS, left breast UOQ: DCIS ER 95%, PR 100% on both sides  Pathology review: I discussed with the patient the difference between DCIS and invasive breast cancer. It is considered a precancerous lesion. DCIS is classified as a 0. It is generally detected through mammograms as calcifications. We discussed the significance of grades and its impact on prognosis. We also discussed the importance of ER and PR receptors and their implications to adjuvant treatment options. Prognosis of DCIS dependence on grade, comedo necrosis. It is anticipated that if not treated, 20-30% of DCIS can develop into invasive breast cancer.  Recommendation: 1.  Bilateral lumpectomies 2. Followed by bilateral adjuvant radiation therapy 3. Followed by antiestrogen therapy with tamoxifen 5 years  Tamoxifen counseling: We discussed the risks and benefits of tamoxifen. These include but not limited to insomnia, hot flashes, mood changes, vaginal dryness, and weight gain. Although rare, serious side effects including endometrial cancer, risk of blood clots were also discussed. We strongly believe that the benefits far outweigh the risks. Patient understands these risks and consented to starting treatment. Planned treatment duration is 5 years.  Return to clinic after surgery to discuss the final pathology report and come up with an adjuvant treatment plan.

## 2021-08-28 ENCOUNTER — Encounter: Payer: Self-pay | Admitting: Physical Therapy

## 2021-08-28 ENCOUNTER — Ambulatory Visit (INDEPENDENT_AMBULATORY_CARE_PROVIDER_SITE_OTHER): Payer: BC Managed Care – PPO | Admitting: Physician Assistant

## 2021-08-28 ENCOUNTER — Telehealth: Payer: Self-pay | Admitting: Hematology and Oncology

## 2021-08-28 ENCOUNTER — Ambulatory Visit: Payer: BC Managed Care – PPO | Attending: General Surgery | Admitting: Physical Therapy

## 2021-08-28 VITALS — BP 115/77 | HR 80 | Ht 65.5 in | Wt 184.6 lb

## 2021-08-28 DIAGNOSIS — R293 Abnormal posture: Secondary | ICD-10-CM | POA: Insufficient documentation

## 2021-08-28 DIAGNOSIS — D0511 Intraductal carcinoma in situ of right breast: Secondary | ICD-10-CM | POA: Insufficient documentation

## 2021-08-28 DIAGNOSIS — D0512 Intraductal carcinoma in situ of left breast: Secondary | ICD-10-CM | POA: Insufficient documentation

## 2021-08-28 DIAGNOSIS — N6099 Unspecified benign mammary dysplasia of unspecified breast: Secondary | ICD-10-CM

## 2021-08-28 MED ORDER — ONDANSETRON 4 MG PO TBDP: 4.0000 mg | ORAL_TABLET | Freq: Three times a day (TID) | ORAL | 0 refills | Status: DC | PRN

## 2021-08-28 MED ORDER — CEPHALEXIN 500 MG PO CAPS: 500.0000 mg | ORAL_CAPSULE | Freq: Four times a day (QID) | ORAL | 0 refills | Status: AC

## 2021-08-28 MED ORDER — DIAZEPAM 2 MG PO TABS: 2.0000 mg | ORAL_TABLET | Freq: Two times a day (BID) | ORAL | 0 refills | Status: DC | PRN

## 2021-08-28 MED ORDER — OXYCODONE HCL 5 MG PO TABS: 5.0000 mg | ORAL_TABLET | Freq: Four times a day (QID) | ORAL | 0 refills | Status: AC | PRN
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
## 2021-08-28 NOTE — Telephone Encounter (Signed)
Scheduled appointment per 01/10 los. Patient aware.

## 2021-08-29 ENCOUNTER — Encounter: Payer: Self-pay | Admitting: *Deleted

## 2021-09-02 ENCOUNTER — Ambulatory Visit: Payer: BC Managed Care – PPO | Admitting: Pulmonary Disease

## 2021-09-03 ENCOUNTER — Telehealth: Payer: Self-pay | Admitting: Plastic Surgery

## 2021-09-03 NOTE — Telephone Encounter (Signed)
Patient called in regards to surgery on 1/30 but she would like to request an appointment with Dr. Marla Roe personally to talk about the surgery more. She just feels like she still has a lot of questions but she would like to talk to Dr. Marla Roe.  Please follow up with patient and let the front desk know if there is any additional scheduling needed.

## 2021-09-06 ENCOUNTER — Telehealth: Payer: BC Managed Care – PPO | Admitting: Plastic Surgery

## 2021-09-06 ENCOUNTER — Encounter: Payer: Self-pay | Admitting: Plastic Surgery

## 2021-09-06 DIAGNOSIS — N6099 Unspecified benign mammary dysplasia of unspecified breast: Secondary | ICD-10-CM

## 2021-09-06 DIAGNOSIS — D0511 Intraductal carcinoma in situ of right breast: Secondary | ICD-10-CM | POA: Diagnosis not present

## 2021-09-06 NOTE — Progress Notes (Addendum)
° °  Subjective:    Patient ID: Diana Tate, female    DOB: 25-May-1971, 51 y.o.   MRN: [id number]  The patient is a 51 year old female joining me by televisit with her husband for further discussion on her breast reconstruction.  She had a biopsy which showed DCIS of the upper outer quadrant of the right breast.  It was estrogen and progesterone positive.  She does not have diabetes and is not a smoker.  She is 5 feet 5 inches tall and weighs 180 pounds.  Preoperative bra size is a DD.  She has grade 3 ptosis of her breasts she did have a lumpectomy of the left breast in 2003 but has not had any radiation.  Her surgeon is Dr. Marlou Starks.  She wanted further clarification of the timeline, my experience and details of the surgery.     Review of Systems  Constitutional: Negative.   HENT: Negative.    Eyes: Negative.   Respiratory: Negative.    Endocrine: Negative.   Genitourinary: Negative.       Objective:   Physical Exam      Assessment & Plan:     ICD-10-CM   1. Atypical ductal hyperplasia of breast  N60.99     2. Ductal carcinoma in situ (DCIS) of right breast  D05.11        I connected with  Diana Tate on 09/06/21 by a video enabled telemedicine application and verified that I am speaking with the correct person using two identifiers.   I discussed the limitations of evaluation and management by telemedicine. The patient expressed understanding and agreed to proceed. The patient was at home and I was at the office.  I spent 20 minutes in the following manner: Review of chart, review of previous discussion, further discussion and documentation of the visit.  The patient is still comfortable with bilateral mastectomies and implant based reconstruction.

## 2021-09-10 NOTE — Pre-Procedure Instructions (Signed)
Surgical Instructions    Your procedure is scheduled on Monday 09/16/21.   Report to [facility] Main Entrance "A" at 06:30 A.M., then check in with the Admitting office.  Call this number if you have problems the morning of surgery:  [phone]   If you have any questions prior to your surgery date call [phone]: Open Monday-Friday 8am-4pm    Remember:  Do not eat after midnight the night before your surgery  You may drink clear liquids until 05:30 A.M. the morning of your surgery.   Clear liquids allowed are: Water, Non-Citrus Juices (without pulp), Carbonated Beverages, Clear Tea, Black Coffee ONLY (NO MILK, CREAM OR POWDERED CREAMER of any kind), and Gatorade    Take these medicines the morning of surgery with A SIP OF WATER   diazepam (VALIUM)- If needed  ondansetron (ZOFRAN-ODT)- If needed  pantoprazole (PROTONIX)- If needed   As of today, STOP taking any Aspirin (unless otherwise instructed by your surgeon) Aleve, Naproxen, Ibuprofen, Motrin, Advil, Goody's, BC's, all herbal medications, fish oil, and all vitamins.  After your COVID test   You are not required to quarantine however you are required to wear a well-fitting mask when you are out and around people not in your household.  If your mask becomes wet or soiled, replace with a new one.  Wash your hands often with soap and water for 20 seconds or clean your hands with an alcohol-based hand sanitizer that contains at least 60% alcohol.  Do not share personal items.  Notify your provider: if you are in close contact with someone who has COVID  or if you develop a fever of 100.4 or greater, sneezing, cough, sore throat, shortness of breath or body aches.           Do not wear jewelry or makeup Do not wear lotions, powders, perfumes/colognes, or deodorant. Do not shave 48 hours prior to surgery.  Men may shave face and neck. Do not bring valuables to the hospital. DO Not wear nail polish, gel polish, artificial  nails, or any other type of covering on natural nails (fingers and toes) If you have artificial nails or gel coating that need to be removed by a nail salon, please have this removed prior to surgery. Artificial nails or gel coating may interfere with anesthesia's ability to adequately monitor your vital signs.             [facility] is not responsible for any belongings or valuables.  Do NOT Smoke (Tobacco/Vaping)  24 hours prior to your procedure  If you use a CPAP at night, you may bring your mask for your overnight stay.   Contacts, glasses, hearing aids, dentures or partials may not be worn into surgery, please bring cases for these belongings   For patients admitted to the hospital, discharge time will be determined by your treatment team.   Patients discharged the day of surgery will not be allowed to drive home, and someone needs to stay with them for 24 hours.  NO VISITORS WILL BE ALLOWED IN PRE-OP WHERE PATIENTS ARE PREPPED FOR SURGERY.  ONLY 1 SUPPORT PERSON MAY BE PRESENT IN THE WAITING ROOM WHILE YOU ARE IN SURGERY.  IF YOU ARE TO BE ADMITTED, ONCE YOU ARE IN YOUR ROOM YOU WILL BE ALLOWED TWO (2) VISITORS. 1 (ONE) VISITOR MAY STAY OVERNIGHT BUT MUST ARRIVE TO THE ROOM BY 8pm.  Minor children may have two parents present. Special consideration for safety and communication needs will be  reviewed on a case by case basis.  Special instructions:    Oral Hygiene is also important to reduce your risk of infection.  Remember - BRUSH YOUR TEETH THE MORNING OF SURGERY WITH YOUR REGULAR TOOTHPASTE   [facility]- Preparing For Surgery  Before surgery, you can play an important role. Because skin is not sterile, your skin needs to be as free of germs as possible. You can reduce the number of germs on your skin by washing with CHG (chlorahexidine gluconate) Soap before surgery.  CHG is an antiseptic cleaner which kills germs and bonds with the skin to continue killing germs even after  washing.     Please do not use if you have an allergy to CHG or antibacterial soaps. If your skin becomes reddened/irritated stop using the CHG.  Do not shave (including legs and underarms) for at least 48 hours prior to first CHG shower. It is OK to shave your face.  Please follow these instructions carefully.     Shower the NIGHT BEFORE SURGERY and the MORNING OF SURGERY with CHG Soap.   If you chose to wash your hair, wash your hair first as usual with your normal shampoo. After you shampoo, rinse your hair and body thoroughly to remove the shampoo.  Then [employer] and genitals (private parts) with your normal soap and rinse thoroughly to remove soap.  After that Use CHG Soap as you would any other liquid soap. You can apply CHG directly to the skin and wash gently with a scrungie or a clean washcloth.   Apply the CHG Soap to your body ONLY FROM THE NECK DOWN.  Do not use on open wounds or open sores. Avoid contact with your eyes, ears, mouth and genitals (private parts). Wash Face and genitals (private parts)  with your normal soap.   Wash thoroughly, paying special attention to the area where your surgery will be performed.  Thoroughly rinse your body with warm water from the neck down.  DO NOT shower/wash with your normal soap after using and rinsing off the CHG Soap.  Pat yourself dry with a CLEAN TOWEL.  Wear CLEAN PAJAMAS to bed the night before surgery  Place CLEAN SHEETS on your bed the night before your surgery  DO NOT SLEEP WITH PETS.   Day of Surgery:  Take a shower with CHG soap. Wear Clean/Comfortable clothing the morning of surgery Do not apply any deodorants/lotions.   Remember to brush your teeth WITH YOUR REGULAR TOOTHPASTE.   Please read over the following fact sheets that you were given.

## 2021-09-11 ENCOUNTER — Other Ambulatory Visit: Payer: Self-pay

## 2021-09-11 ENCOUNTER — Encounter (HOSPITAL_COMMUNITY)
Admission: RE | Admit: 2021-09-11 | Discharge: 2021-09-11 | Disposition: A | Discharge status: Home / Self Care | Payer: BC Managed Care – PPO | Source: Ambulatory Visit | Attending: General Surgery | Admitting: General Surgery

## 2021-09-11 ENCOUNTER — Encounter (HOSPITAL_COMMUNITY): Payer: Self-pay

## 2021-09-11 VITALS — BP 112/71 | HR 77 | Temp 97.9°F | Resp 17 | Ht 65.5 in | Wt 186.6 lb

## 2021-09-11 DIAGNOSIS — Z01818 Encounter for other preprocedural examination: Secondary | ICD-10-CM

## 2021-09-11 DIAGNOSIS — Z01812 Encounter for preprocedural laboratory examination: Secondary | ICD-10-CM | POA: Diagnosis present

## 2021-09-11 HISTORY — DX: Malignant (primary) neoplasm, unspecified: C80.1

## 2021-09-11 HISTORY — DX: Sleep apnea, unspecified: G47.30

## 2021-09-11 LAB — CBC
HCT: 40.8 % (ref 36.0–46.0)
Hemoglobin: 12.7 g/dL (ref 12.0–15.0)
MCH: 28.1 pg (ref 26.0–34.0)
MCHC: 31.1 g/dL (ref 30.0–36.0)
MCV: 90.3 fL (ref 80.0–100.0)
Platelets: 244 10*3/uL (ref 150–400)
RBC: 4.52 MIL/uL (ref 3.87–5.11)
RDW: 13.2 % (ref 11.5–15.5)
WBC: 8.9 10*3/uL (ref 4.0–10.5)
nRBC: 0 % (ref 0.0–0.2)

## 2021-09-11 NOTE — Progress Notes (Signed)
PCP - Dr. Adah Salvage Cardiologist - denies  PPM/ICD - n/a Device Orders - n/a Rep Notified - n/a  Chest x-ray - n/a EKG - n/a Stress Test - denies ECHO - denies Cardiac Cath - denies  Sleep Study - Home sleep study 09/09/19. +OSA CPAP - Wears nightly  Fasting Blood Sugar - n/a Checks Blood Sugar _____ times a day- n/a  Blood Thinner Instructions: n/a Aspirin Instructions: n/a  ERAS Protcol - Yes PRE-SURGERY Ensure or G2- No. None ordered  COVID TEST- To be done day of surgery. Patient works 8-5 in [place] and is unable to come back for testing tomorrow or Friday.    Anesthesia review: No  Patient denies shortness of breath, fever, cough and chest pain at PAT appointment   All instructions explained to the patient, with a verbal understanding of the material. Patient agrees to go over the instructions while at home for a better understanding. Patient also instructed to self quarantine after being tested for COVID-19. The opportunity to ask questions was provided.

## 2021-09-15 NOTE — Anesthesia Preprocedure Evaluation (Addendum)
Anesthesia Evaluation  Patient identified by MRN, date of birth, ID band Patient awake    Reviewed: Allergy & Precautions, NPO status , Patient's Chart, lab work & pertinent test results  Airway Mallampati: II  TM Distance: >3 FB Neck ROM: Full    Dental no notable dental hx.    Pulmonary sleep apnea , former smoker,    Pulmonary exam normal breath sounds clear to auscultation       Cardiovascular negative cardio ROS Normal cardiovascular exam Rhythm:Regular Rate:Normal     Neuro/Psych  Headaches, negative psych ROS   GI/Hepatic Neg liver ROS, GERD  Medicated and Controlled,  Endo/Other  negative endocrine ROS  Renal/GU negative Renal ROS     Musculoskeletal negative musculoskeletal ROS (+)   Abdominal   Peds  Hematology negative hematology ROS (+)   Anesthesia Other Findings BILATERAL DCIS  Reproductive/Obstetrics                            Anesthesia Physical Anesthesia Plan  ASA: 2  Anesthesia Plan: General and Regional   Post-op Pain Management:    Induction: Intravenous  PONV Risk Score and Plan: 3 and Ondansetron, Dexamethasone, Midazolam and Treatment may vary due to age or medical condition  Airway Management Planned: Oral ETT  Additional Equipment:   Intra-op Plan:   Post-operative Plan: Extubation in OR  Informed Consent: I have reviewed the patients History and Physical, chart, labs and discussed the procedure including the risks, benefits and alternatives for the proposed anesthesia with the patient or authorized representative who has indicated his/her understanding and acceptance.     Dental advisory given  Plan Discussed with: CRNA  Anesthesia Plan Comments:        Anesthesia Quick Evaluation

## 2021-09-16 ENCOUNTER — Encounter (HOSPITAL_COMMUNITY): Payer: Self-pay | Admitting: General Surgery

## 2021-09-16 ENCOUNTER — Ambulatory Visit (HOSPITAL_COMMUNITY): Payer: BC Managed Care – PPO | Admitting: Anesthesiology

## 2021-09-16 ENCOUNTER — Inpatient Hospital Stay (HOSPITAL_COMMUNITY)
Admission: RE | Admit: 2021-09-16 | Discharge: 2021-09-17 | DRG: 581 | Disposition: A | Discharge status: Home / Self Care | Payer: BC Managed Care – PPO | Attending: Plastic Surgery | Admitting: Plastic Surgery

## 2021-09-16 ENCOUNTER — Other Ambulatory Visit: Payer: Self-pay

## 2021-09-16 ENCOUNTER — Encounter (HOSPITAL_COMMUNITY)
Admission: RE | Disposition: A | Discharge status: Home / Self Care | Payer: Self-pay | Source: Home / Self Care | Attending: Plastic Surgery

## 2021-09-16 DIAGNOSIS — C50919 Malignant neoplasm of unspecified site of unspecified female breast: Secondary | ICD-10-CM | POA: Diagnosis present

## 2021-09-16 DIAGNOSIS — Z91048 Other nonmedicinal substance allergy status: Secondary | ICD-10-CM | POA: Diagnosis not present

## 2021-09-16 DIAGNOSIS — Z888 Allergy status to other drugs, medicaments and biological substances status: Secondary | ICD-10-CM | POA: Diagnosis not present

## 2021-09-16 DIAGNOSIS — Z20822 Contact with and (suspected) exposure to covid-19: Secondary | ICD-10-CM | POA: Diagnosis present

## 2021-09-16 DIAGNOSIS — E785 Hyperlipidemia, unspecified: Secondary | ICD-10-CM | POA: Diagnosis present

## 2021-09-16 DIAGNOSIS — Z8249 Family history of ischemic heart disease and other diseases of the circulatory system: Secondary | ICD-10-CM | POA: Diagnosis not present

## 2021-09-16 DIAGNOSIS — C50911 Malignant neoplasm of unspecified site of right female breast: Principal | ICD-10-CM | POA: Diagnosis present

## 2021-09-16 DIAGNOSIS — K219 Gastro-esophageal reflux disease without esophagitis: Secondary | ICD-10-CM | POA: Diagnosis present

## 2021-09-16 DIAGNOSIS — Z833 Family history of diabetes mellitus: Secondary | ICD-10-CM

## 2021-09-16 DIAGNOSIS — G4733 Obstructive sleep apnea (adult) (pediatric): Secondary | ICD-10-CM | POA: Diagnosis present

## 2021-09-16 DIAGNOSIS — Z87891 Personal history of nicotine dependence: Secondary | ICD-10-CM

## 2021-09-16 DIAGNOSIS — C50912 Malignant neoplasm of unspecified site of left female breast: Secondary | ICD-10-CM | POA: Diagnosis present

## 2021-09-16 HISTORY — PX: AXILLARY SENTINEL NODE BIOPSY: SHX5738

## 2021-09-16 HISTORY — PX: BREAST RECONSTRUCTION WITH PLACEMENT OF TISSUE EXPANDER AND FLEX HD (ACELLULAR HYDRATED DERMIS): SHX6295

## 2021-09-16 HISTORY — PX: TOTAL MASTECTOMY: SHX6129

## 2021-09-16 LAB — SARS CORONAVIRUS 2 BY RT PCR (HOSPITAL ORDER, PERFORMED IN ~~LOC~~ HOSPITAL LAB): SARS Coronavirus 2: NEGATIVE

## 2021-09-16 LAB — HIV ANTIBODY (ROUTINE TESTING W REFLEX): HIV Screen 4th Generation wRfx: NONREACTIVE

## 2021-09-16 SURGERY — MASTECTOMY, SIMPLE
Anesthesia: Regional | Site: Breast | Laterality: Bilateral

## 2021-09-16 MED ORDER — ACETAMINOPHEN 500 MG PO TABS
1000.0000 mg | ORAL_TABLET | ORAL | Status: AC
  Administered 2021-09-16: 1000 mg via ORAL
  Filled 2021-09-16: qty 2

## 2021-09-16 MED ORDER — MIDAZOLAM HCL 2 MG/2ML IJ SOLN
INTRAMUSCULAR | Status: AC
  Filled 2021-09-16: qty 2

## 2021-09-16 MED ORDER — HYDROCODONE-ACETAMINOPHEN 5-325 MG PO TABS: 1.0000 | ORAL_TABLET | ORAL | Status: DC | PRN

## 2021-09-16 MED ORDER — DIPHENHYDRAMINE HCL 50 MG/ML IJ SOLN: 12.5000 mg | Freq: Four times a day (QID) | INTRAMUSCULAR | Status: DC | PRN

## 2021-09-16 MED ORDER — OXYCODONE HCL 5 MG PO TABS: 5.0000 mg | ORAL_TABLET | Freq: Once | ORAL | Status: AC | PRN

## 2021-09-16 MED ORDER — DIPHENHYDRAMINE HCL 12.5 MG/5ML PO ELIX
12.5000 mg | ORAL_SOLUTION | Freq: Four times a day (QID) | ORAL | Status: DC | PRN
Start: 2021-09-16 — End: 2021-09-17

## 2021-09-16 MED ORDER — MAGTRACE LYMPHATIC TRACER
INTRAMUSCULAR | Status: DC | PRN
  Administered 2021-09-16 (×2): 2 mL via INTRAMUSCULAR

## 2021-09-16 MED ORDER — SODIUM CHLORIDE 0.9 % IV SOLN
INTRAVENOUS | Status: DC | PRN
  Administered 2021-09-16: 500 mL

## 2021-09-16 MED ORDER — PROPOFOL 10 MG/ML IV BOLUS
INTRAVENOUS | Status: DC | PRN
  Administered 2021-09-16: 200 mg via INTRAVENOUS

## 2021-09-16 MED ORDER — ORAL CARE MOUTH RINSE: 15.0000 mL | Freq: Once | OROMUCOSAL | Status: AC

## 2021-09-16 MED ORDER — CHLORHEXIDINE GLUCONATE 0.12 % MT SOLN
15.0000 mL | Freq: Once | OROMUCOSAL | Status: AC
  Administered 2021-09-16: 15 mL via OROMUCOSAL
  Filled 2021-09-16: qty 15

## 2021-09-16 MED ORDER — KCL IN DEXTROSE-NACL 20-5-0.45 MEQ/L-%-% IV SOLN
INTRAVENOUS | Status: DC
  Filled 2021-09-16 (×2): qty 1000

## 2021-09-16 MED ORDER — ROCURONIUM BROMIDE 10 MG/ML (PF) SYRINGE
PREFILLED_SYRINGE | INTRAVENOUS | Status: DC | PRN
  Administered 2021-09-16: 30 mg via INTRAVENOUS
  Administered 2021-09-16: 40 mg via INTRAVENOUS
  Administered 2021-09-16: 60 mg via INTRAVENOUS
  Administered 2021-09-16: 20 mg via INTRAVENOUS

## 2021-09-16 MED ORDER — SENNA 8.6 MG PO TABS
1.0000 | ORAL_TABLET | Freq: Two times a day (BID) | ORAL | Status: DC
  Administered 2021-09-16 – 2021-09-17 (×2): 8.6 mg via ORAL
  Filled 2021-09-16 (×2): qty 1

## 2021-09-16 MED ORDER — CHLORHEXIDINE GLUCONATE CLOTH 2 % EX PADS: 6.0000 | MEDICATED_PAD | Freq: Once | CUTANEOUS | Status: DC

## 2021-09-16 MED ORDER — MELATONIN 3 MG PO TABS: 3.0000 mg | ORAL_TABLET | Freq: Every evening | ORAL | Status: DC | PRN

## 2021-09-16 MED ORDER — SUGAMMADEX SODIUM 200 MG/2ML IV SOLN
INTRAVENOUS | Status: DC | PRN
  Administered 2021-09-16: 200 mg via INTRAVENOUS

## 2021-09-16 MED ORDER — MIDAZOLAM HCL 2 MG/2ML IJ SOLN
INTRAMUSCULAR | Status: DC | PRN
  Administered 2021-09-16: 2 mg via INTRAVENOUS

## 2021-09-16 MED ORDER — GABAPENTIN 300 MG PO CAPS
300.0000 mg | ORAL_CAPSULE | ORAL | Status: AC
  Administered 2021-09-16: 300 mg via ORAL
  Filled 2021-09-16: qty 1

## 2021-09-16 MED ORDER — CEFAZOLIN SODIUM-DEXTROSE 2-4 GM/100ML-% IV SOLN: 2.0000 g | INTRAVENOUS | Status: DC

## 2021-09-16 MED ORDER — ROCURONIUM BROMIDE 10 MG/ML (PF) SYRINGE
PREFILLED_SYRINGE | INTRAVENOUS | Status: AC
  Filled 2021-09-16: qty 20

## 2021-09-16 MED ORDER — CEFAZOLIN SODIUM-DEXTROSE 2-4 GM/100ML-% IV SOLN
2.0000 g | Freq: Three times a day (TID) | INTRAVENOUS | Status: DC
  Administered 2021-09-16 – 2021-09-17 (×3): 2 g via INTRAVENOUS
  Filled 2021-09-16 (×5): qty 100

## 2021-09-16 MED ORDER — ONDANSETRON HCL 4 MG/2ML IJ SOLN
INTRAMUSCULAR | Status: AC
  Filled 2021-09-16: qty 2

## 2021-09-16 MED ORDER — POLYETHYLENE GLYCOL 3350 17 G PO PACK: 17.0000 g | PACK | Freq: Every day | ORAL | Status: DC | PRN

## 2021-09-16 MED ORDER — FENTANYL CITRATE (PF) 100 MCG/2ML IJ SOLN
INTRAMUSCULAR | Status: AC
  Filled 2021-09-16: qty 2

## 2021-09-16 MED ORDER — PROMETHAZINE HCL 25 MG/ML IJ SOLN: 6.2500 mg | INTRAMUSCULAR | Status: DC | PRN

## 2021-09-16 MED ORDER — FENTANYL CITRATE (PF) 250 MCG/5ML IJ SOLN
INTRAMUSCULAR | Status: DC | PRN
  Administered 2021-09-16 (×3): 50 ug via INTRAVENOUS
  Administered 2021-09-16: 100 ug via INTRAVENOUS

## 2021-09-16 MED ORDER — DEXAMETHASONE SODIUM PHOSPHATE 10 MG/ML IJ SOLN
INTRAMUSCULAR | Status: AC
  Filled 2021-09-16: qty 1

## 2021-09-16 MED ORDER — ONDANSETRON HCL 4 MG/2ML IJ SOLN
INTRAMUSCULAR | Status: DC | PRN
  Administered 2021-09-16: 4 mg via INTRAVENOUS

## 2021-09-16 MED ORDER — SODIUM CHLORIDE 0.9 % IR SOLN
Status: DC | PRN
  Administered 2021-09-16: 1000 mL

## 2021-09-16 MED ORDER — LACTATED RINGERS IV SOLN: INTRAVENOUS | Status: DC

## 2021-09-16 MED ORDER — CEFAZOLIN SODIUM-DEXTROSE 2-4 GM/100ML-% IV SOLN
2.0000 g | INTRAVENOUS | Status: AC
  Administered 2021-09-16: 2 g via INTRAVENOUS
  Filled 2021-09-16: qty 100

## 2021-09-16 MED ORDER — HYDROMORPHONE HCL 1 MG/ML IJ SOLN
1.0000 mg | INTRAMUSCULAR | Status: DC | PRN
  Administered 2021-09-16 – 2021-09-17 (×4): 1 mg via INTRAVENOUS
  Filled 2021-09-16 (×4): qty 1

## 2021-09-16 MED ORDER — OXYCODONE HCL 5 MG/5ML PO SOLN
ORAL | Status: AC
  Filled 2021-09-16: qty 5

## 2021-09-16 MED ORDER — DIAZEPAM 2 MG PO TABS: 2.0000 mg | ORAL_TABLET | Freq: Two times a day (BID) | ORAL | Status: DC | PRN

## 2021-09-16 MED ORDER — LIDOCAINE 2% (20 MG/ML) 5 ML SYRINGE
INTRAMUSCULAR | Status: DC | PRN
  Administered 2021-09-16: 40 mg via INTRAVENOUS

## 2021-09-16 MED ORDER — LIDOCAINE 2% (20 MG/ML) 5 ML SYRINGE
INTRAMUSCULAR | Status: AC
  Filled 2021-09-16: qty 5

## 2021-09-16 MED ORDER — PROPOFOL 10 MG/ML IV BOLUS
INTRAVENOUS | Status: AC
  Filled 2021-09-16: qty 20

## 2021-09-16 MED ORDER — AMISULPRIDE (ANTIEMETIC) 5 MG/2ML IV SOLN: 10.0000 mg | Freq: Once | INTRAVENOUS | Status: DC | PRN

## 2021-09-16 MED ORDER — FENTANYL CITRATE (PF) 250 MCG/5ML IJ SOLN
INTRAMUSCULAR | Status: AC
  Filled 2021-09-16: qty 5

## 2021-09-16 MED ORDER — 0.9 % SODIUM CHLORIDE (POUR BTL) OPTIME
TOPICAL | Status: DC | PRN
Start: 2021-09-16 — End: 2021-09-16
  Administered 2021-09-16 (×2): 1000 mL

## 2021-09-16 MED ORDER — ACETAMINOPHEN 325 MG PO TABS
325.0000 mg | ORAL_TABLET | Freq: Four times a day (QID) | ORAL | Status: DC
  Administered 2021-09-16 – 2021-09-17 (×4): 325 mg via ORAL
  Filled 2021-09-16 (×4): qty 1

## 2021-09-16 MED ORDER — PHENYLEPHRINE HCL-NACL 20-0.9 MG/250ML-% IV SOLN
INTRAVENOUS | Status: DC | PRN
Start: 2021-09-16 — End: 2021-09-16
  Administered 2021-09-16: 25 ug/min via INTRAVENOUS

## 2021-09-16 MED ORDER — BUPIVACAINE HCL (PF) 0.25 % IJ SOLN
INTRAMUSCULAR | Status: DC | PRN
  Administered 2021-09-16 (×2): 30 mL via PERINEURAL

## 2021-09-16 MED ORDER — FENTANYL CITRATE (PF) 100 MCG/2ML IJ SOLN
25.0000 ug | INTRAMUSCULAR | Status: DC | PRN
  Administered 2021-09-16 (×2): 25 ug via INTRAVENOUS
  Administered 2021-09-16: 50 ug via INTRAVENOUS
  Administered 2021-09-16: 25 ug via INTRAVENOUS

## 2021-09-16 MED ORDER — DEXAMETHASONE SODIUM PHOSPHATE 10 MG/ML IJ SOLN
INTRAMUSCULAR | Status: DC | PRN
Start: 2021-09-16 — End: 2021-09-16
  Administered 2021-09-16: 10 mg via INTRAVENOUS

## 2021-09-16 MED ORDER — CELECOXIB 200 MG PO CAPS
200.0000 mg | ORAL_CAPSULE | ORAL | Status: AC
  Administered 2021-09-16: 200 mg via ORAL
  Filled 2021-09-16: qty 1

## 2021-09-16 MED ORDER — OXYCODONE HCL 5 MG/5ML PO SOLN
5.0000 mg | Freq: Once | ORAL | Status: AC | PRN
  Administered 2021-09-16: 5 mg via ORAL

## 2021-09-16 MED ORDER — PROPOFOL 500 MG/50ML IV EMUL
INTRAVENOUS | Status: DC | PRN
  Administered 2021-09-16: 25 ug/kg/min via INTRAVENOUS

## 2021-09-16 MED ORDER — ONDANSETRON 4 MG PO TBDP: 4.0000 mg | ORAL_TABLET | Freq: Four times a day (QID) | ORAL | Status: DC | PRN

## 2021-09-16 MED ORDER — IBUPROFEN 400 MG PO TABS
400.0000 mg | ORAL_TABLET | Freq: Four times a day (QID) | ORAL | Status: DC
  Administered 2021-09-16 – 2021-09-17 (×4): 400 mg via ORAL
  Filled 2021-09-16 (×4): qty 1

## 2021-09-16 MED ORDER — METHOCARBAMOL 500 MG PO TABS
500.0000 mg | ORAL_TABLET | Freq: Four times a day (QID) | ORAL | Status: DC | PRN
  Administered 2021-09-16 – 2021-09-17 (×2): 500 mg via ORAL
  Filled 2021-09-16 (×2): qty 1

## 2021-09-16 MED ORDER — ONDANSETRON HCL 4 MG/2ML IJ SOLN: 4.0000 mg | Freq: Four times a day (QID) | INTRAMUSCULAR | Status: DC | PRN

## 2021-09-16 SURGICAL SUPPLY — 76 items
ADH SKN CLS APL DERMABOND .7 (GAUZE/BANDAGES/DRESSINGS) ×2
APL PRP STRL LF DISP 70% ISPRP (MISCELLANEOUS) ×2
APPLIER CLIP 9.375 MED OPEN (MISCELLANEOUS) ×4
APR CLP MED 9.3 20 MLT OPN (MISCELLANEOUS) ×2
BAG COUNTER SPONGE SURGICOUNT (BAG) ×6 IMPLANT
BAG DECANTER FOR FLEXI CONT (MISCELLANEOUS) ×2 IMPLANT
BAG SPNG CNTER NS LX DISP (BAG) ×2
BINDER BREAST LRG (GAUZE/BANDAGES/DRESSINGS) IMPLANT
BINDER BREAST XLRG (GAUZE/BANDAGES/DRESSINGS) ×1 IMPLANT
BIOPATCH RED 1 DISK 7.0 (GAUZE/BANDAGES/DRESSINGS) ×10 IMPLANT
CANISTER SUCT 3000ML PPV (MISCELLANEOUS) ×6 IMPLANT
CHLORAPREP W/TINT 26 (MISCELLANEOUS) ×6 IMPLANT
CLIP APPLIE 9.375 MED OPEN (MISCELLANEOUS) ×2 IMPLANT
CNTNR URN SCR LID CUP LEK RST (MISCELLANEOUS) IMPLANT
CONT SPEC 4OZ STRL OR WHT (MISCELLANEOUS) ×4
COVER PROBE W GEL 5X96 (DRAPES) ×1 IMPLANT
COVER SURGICAL LIGHT HANDLE (MISCELLANEOUS) ×6 IMPLANT
DERMABOND ADVANCED (GAUZE/BANDAGES/DRESSINGS) ×2
DERMABOND ADVANCED .7 DNX12 (GAUZE/BANDAGES/DRESSINGS) ×4 IMPLANT
DEVICE DSSCT PLSMBLD 3.0S LGHT (MISCELLANEOUS) ×2 IMPLANT
DRAIN CHANNEL 19F RND (DRAIN) ×6 IMPLANT
DRAPE HALF SHEET 40X57 (DRAPES) ×4 IMPLANT
DRAPE LAPAROSCOPIC ABDOMINAL (DRAPES) ×2 IMPLANT
DRAPE ORTHO SPLIT 77X108 STRL (DRAPES) ×4
DRAPE SURG 17X23 STRL (DRAPES) ×8 IMPLANT
DRAPE SURG ORHT 6 SPLT 77X108 (DRAPES) ×4 IMPLANT
DRAPE WARM FLUID 44X44 (DRAPES) ×3 IMPLANT
DRSG OPSITE POSTOP 4X8 (GAUZE/BANDAGES/DRESSINGS) ×2 IMPLANT
DRSG PAD ABDOMINAL 8X10 ST (GAUZE/BANDAGES/DRESSINGS) ×8 IMPLANT
ELECT BLADE 4.0 EZ CLEAN MEGAD (MISCELLANEOUS)
ELECT REM PT RETURN 9FT ADLT (ELECTROSURGICAL) ×4
ELECTRODE BLDE 4.0 EZ CLN MEGD (MISCELLANEOUS) IMPLANT
ELECTRODE REM PT RTRN 9FT ADLT (ELECTROSURGICAL) ×4 IMPLANT
EVACUATOR SILICONE 100CC (DRAIN) ×6 IMPLANT
GAUZE SPONGE 4X4 12PLY STRL (GAUZE/BANDAGES/DRESSINGS) ×5 IMPLANT
GLOVE SURG ENC MOIS LTX SZ6.5 (GLOVE) ×6 IMPLANT
GLOVE SURG ENC MOIS LTX SZ7.5 (GLOVE) ×3 IMPLANT
GOWN STRL REUS W/ TWL LRG LVL3 (GOWN DISPOSABLE) ×8 IMPLANT
GOWN STRL REUS W/TWL LRG LVL3 (GOWN DISPOSABLE) ×8
GRAFT FLEX HD 6X16 PLIABLE (Tissue) ×2 IMPLANT
IMPL EXPANDER BREAST 535CC (Breast) IMPLANT
IMPLANT BREAST 535CC (Breast) ×2 IMPLANT
IMPLANT EXPANDER BREAST 535CC (Breast) ×2 IMPLANT
KIT BASIN OR (CUSTOM PROCEDURE TRAY) ×6 IMPLANT
KIT FILL ASEPTIC TRANSFER (MISCELLANEOUS) ×1 IMPLANT
KIT TURNOVER KIT B (KITS) ×5 IMPLANT
LIGHT WAVEGUIDE WIDE FLAT (MISCELLANEOUS) IMPLANT
NS IRRIG 1000ML POUR BTL (IV SOLUTION) ×8 IMPLANT
PACK BASIC III (CUSTOM PROCEDURE TRAY) ×2
PACK GENERAL/GYN (CUSTOM PROCEDURE TRAY) ×6 IMPLANT
PACK SRG BSC III STRL LF ECLPS (CUSTOM PROCEDURE TRAY) IMPLANT
PAD ARMBOARD 7.5X6 YLW CONV (MISCELLANEOUS) ×6 IMPLANT
PENCIL SMOKE EVACUATOR (MISCELLANEOUS) IMPLANT
PIN SAFETY STERILE (MISCELLANEOUS) ×3 IMPLANT
PLASMABLADE 3.0S W/LIGHT (MISCELLANEOUS) ×2
SET ASEPTIC TRANSFER (MISCELLANEOUS) IMPLANT
SPECIMEN JAR X LARGE (MISCELLANEOUS) ×2 IMPLANT
STRIP CLOSURE SKIN 1/2X4 (GAUZE/BANDAGES/DRESSINGS) ×1 IMPLANT
SUT ETHILON 3 0 FSL (SUTURE) ×2 IMPLANT
SUT MNCRL AB 4-0 PS2 18 (SUTURE) ×4 IMPLANT
SUT MON AB 3-0 SH 27 (SUTURE) ×4
SUT MON AB 3-0 SH27 (SUTURE) ×4 IMPLANT
SUT MON AB 4-0 PC3 18 (SUTURE) ×4 IMPLANT
SUT MON AB 5-0 PS2 18 (SUTURE) ×4 IMPLANT
SUT PDS AB 2-0 CT1 27 (SUTURE) ×17 IMPLANT
SUT PDS AB 3-0 SH 27 (SUTURE) ×2 IMPLANT
SUT SILK 3 0 SH 30 (SUTURE) ×2 IMPLANT
SUT SILK 4 0 PS 2 (SUTURE) ×2 IMPLANT
SUT VIC AB 3-0 54X BRD REEL (SUTURE) IMPLANT
SUT VIC AB 3-0 BRD 54 (SUTURE)
SUT VIC AB 3-0 SH 18 (SUTURE) ×2 IMPLANT
TOWEL GREEN STERILE (TOWEL DISPOSABLE) ×6 IMPLANT
TOWEL GREEN STERILE FF (TOWEL DISPOSABLE) ×6 IMPLANT
TRACER MAGTRACE VIAL (MISCELLANEOUS) ×2 IMPLANT
TRAY FOLEY MTR SLVR 14FR STAT (SET/KITS/TRAYS/PACK) ×1 IMPLANT
TUBE CONNECTING 12X1/4 (SUCTIONS) ×3 IMPLANT

## 2021-09-16 NOTE — Interval H&P Note (Signed)
History and Physical Interval Note:  09/16/2021 7:09 AM  Diana Tate  has presented today for surgery, with the diagnosis of BILATERAL DCIS.  The various methods of treatment have been discussed with the patient and family. After consideration of risks, benefits and other options for treatment, the patient has consented to  Procedure(s): BILATERAL TOTAL MASTECTOMY (Bilateral) BREAST RECONSTRUCTION WITH PLACEMENT OF TISSUE EXPANDER AND FLEX HD (ACELLULAR HYDRATED DERMIS) (Bilateral) as a surgical intervention.  The patient's history has been reviewed, patient examined, no change in status, stable for surgery.  I have reviewed the patient's chart and labs.  Questions were answered to the patient's satisfaction.     Loel Lofty Kendra Grissett

## 2021-09-16 NOTE — Anesthesia Procedure Notes (Signed)
Anesthesia Regional Block: Pectoralis block   Pre-Anesthetic Checklist: , timeout performed,  Correct Patient, Correct Site, Correct Laterality,  Correct Procedure, Correct Position, site marked,  Risks and benefits discussed,  Surgical consent,  Pre-op evaluation,  At surgeon's request and post-op pain management  Laterality: Left  Prep: chloraprep       Needles:  Injection technique: Single-shot  Needle Type: Echogenic Stimulator Needle     Needle Length: 9cm  Needle Gauge: 21     Additional Needles:   Procedures:,,,, ultrasound used (permanent image in chart),,    Narrative:  Start time: 09/16/2021 8:10 AM End time: 09/16/2021 8:20 AM Injection made incrementally with aspirations every 5 mL.  Performed by: Personally  Anesthesiologist: Murvin Natal, MD  Additional Notes: Functioning IV was confirmed and monitors were applied.  A timeout was performed. Sterile prep, hand hygiene and sterile gloves were used. A 17mm 21ga Arrow echogenic stimulator needle was used. Negative aspiration and negative test dose prior to incremental administration of local anesthetic. The patient tolerated the procedure well.  Ultrasound guidance: relevent anatomy identified, needle position confirmed, local anesthetic spread visualized around nerve(s), vascular puncture avoided.  Image printed for medical record.

## 2021-09-16 NOTE — Transfer of Care (Signed)
Immediate Anesthesia Transfer of Care Note  Patient: Diana Tate  Procedure(s) Performed: BILATERAL TOTAL MASTECTOMY (Bilateral: Breast) BILATERAL AXILLARY SENTINEL NODE BIOPSY (Bilateral: Breast) BREAST RECONSTRUCTION WITH PLACEMENT OF TISSUE EXPANDER AND FLEX HD (ACELLULAR HYDRATED DERMIS) (Bilateral: Breast)  Patient Location: PACU  Anesthesia Type:GA combined with regional for post-op pain  Level of Consciousness: awake, alert  and oriented  Airway & Oxygen Therapy: Patient Spontanous Breathing and Patient connected to face mask oxygen  Post-op Assessment: Report given to RN and Post -op Vital signs reviewed and stable  Post vital signs: Reviewed and stable  Last Vitals:  Vitals Value Taken Time  BP 129/72 09/16/21 1225  Temp 35.9 C 09/16/21 1225  Pulse 78 09/16/21 1237  Resp 11 09/16/21 1237  SpO2 99 % 09/16/21 1237  Vitals shown include unvalidated device data.  Last Pain:  Vitals:   09/16/21 0645  TempSrc:   PainSc: 0-No pain         Complications: No notable events documented.

## 2021-09-16 NOTE — Interval H&P Note (Signed)
History and Physical Interval Note:  09/16/2021 8:08 AM  Diana Tate  has presented today for surgery, with the diagnosis of BILATERAL DCIS.  The various methods of treatment have been discussed with the patient and family. After consideration of risks, benefits and other options for treatment, the patient has consented to  Procedure(s): BILATERAL TOTAL MASTECTOMY with bilateral sentinel node biopsy(Bilateral) BREAST RECONSTRUCTION WITH PLACEMENT OF TISSUE EXPANDER AND FLEX HD (ACELLULAR HYDRATED DERMIS) (Bilateral) as a surgical intervention.  The patient's history has been reviewed, patient examined, no change in status, stable for surgery.  I have reviewed the patient's chart and labs.  Questions were answered to the patient's satisfaction.     Autumn Messing III

## 2021-09-16 NOTE — Op Note (Signed)
Op report    DATE OF OPERATION:  09/16/2021  LOCATION: [facility]  SURGICAL DIVISION: Plastic Surgery  PREOPERATIVE DIAGNOSES:  1. Breast cancer.    POSTOPERATIVE DIAGNOSES:  1. Breast cancer.   PROCEDURE:  1. Bilateral immediate breast reconstruction with placement of Acellular Dermal Matrix and tissue expanders.  SURGEON: Laiba Fuerte Sanger Kiam Bransfield, DO  ASSISTANT: Roetta Sessions, PA  ANESTHESIA:  General.   COMPLICATIONS: None.   IMPLANTS: Left - Mentor 535 cc. Ref #[id number].  Serial Number [id number], 100 cc of injectable saline placed in the expander. Right - Mentor 535 cc. Ref #[id number].  Serial Number [id number], 100 cc of injectable saline placed in the expander. Acellular Dermal Matrix 6 x 16 cm x 2 Flex HD  INDICATIONS FOR PROCEDURE:  The patient, Diana Tate, is a 51 y.o. female born on 05-Aug-1971, is here for  immediate first stage breast reconstruction with placement of bilateral tissue expander and Acellular dermal matrix. MRN: [id number]  CONSENT:  Informed consent was obtained directly from the patient. Risks, benefits and alternatives were fully discussed. Specific risks including but not limited to bleeding, infection, hematoma, seroma, scarring, pain, implant infection, implant extrusion, capsular contracture, asymmetry, wound healing problems, and need for further surgery were all discussed. The patient did have an ample opportunity to have her questions answered to her satisfaction.   DESCRIPTION OF PROCEDURE:  The patient was taken to the operating room by the general surgery team. SCDs were placed and IV antibiotics were given. The patient's chest was prepped and draped in a sterile fashion. A time out was performed and the implants to be used were identified.  Bilateral mastectomies were performed.  Once the general surgery team had completed their portion of the case the patient was rendered to the plastic and reconstructive surgery  team.  Right:  The pectoralis major muscle was lifted from the chest wall with release of the lateral edge and lateral inframammary fold.  The patient was extremely tight under the muscle which was very adherent to the ribs on both sides.  The pocket was irrigated with antibiotic solution and hemostasis was achieved with electrocautery.  The ADM was then prepared according to the manufacture guidelines and slits placed to help with postoperative fluid management.  The ADM was then sutured to the inferior and lateral edge of the inframammary fold with 2-0 PDS starting with an interrupted stitch and then a running stitch.  The lateral portion was sutured to with interrupted sutures after the expander was placed.  The expander was prepared according to the manufacture guidelines, the air evacuated and then it was placed under the ADM and pectoralis major muscle.  The inferior and lateral tabs were used to secure the expander to the chest wall with 2-0 PDS.  The drain was placed at the inframammary fold over the ADM and secured to the skin with 3-0 Silk.  The deep layers were closed with 3-0 PDS followed by 3-0 Monocryl.  The skin was closed with 4-0 Monocryl and then dermabond was applied.   Left:  The pectoralis major muscle was lifted from the chest wall with release of the lateral edge and lateral inframammary fold.  The pocket was irrigated with antibiotic solution and hemostasis was achieved with electrocautery.  The ADM was then prepared according to the manufacture guidelines and slits placed to help with postoperative fluid management.  The ADM was then sutured to the inferior and lateral edge of the inframammary fold with 2-0 PDS starting  with an interrupted stitch and then a running stitch.  The lateral portion was sutured to with interrupted sutures after the expander was placed.  The expander was prepared according to the manufacture guidelines, the air evacuated and then it was placed under the ADM and  pectoralis major muscle.  The inferior and lateral tabs were used to secure the expander to the chest wall with 2-0 PDS.  The drain was placed at the inframammary fold over the ADM and secured to the skin with 3-0 Silk.  The deep layers were closed with 3-0 PDS followed by 3-0 Monocryl.  The skin was closed with 4-0 Monocryl and then dermabond was applied.  The ABDs and breast binder were placed.  The patient tolerated the procedure well and there were no complications.  The patient was allowed to wake from anesthesia and taken to the recovery room in satisfactory condition.  The ABDs and breast binder were placed.  The patient tolerated the procedure well and there were no complications.  The patient was allowed to wake from anesthesia and taken to the recovery room in satisfactory condition.   The advanced practice practitioner (APP) assisted throughout the case.  The APP was essential in retraction and counter traction when needed to make the case progress smoothly.  This retraction and assistance made it possible to see the tissue plans for the procedure.  The assistance was needed for blood control, tissue re-approximation and assisted with closure of the incision site.

## 2021-09-16 NOTE — Discharge Instructions (Addendum)
INSTRUCTIONS FOR AFTER BREAST SURGERY   You will likely have some questions about what to expect following your operation.  The following information will help you and your family understand what to expect when you are discharged from the hospital.  Following these guidelines will help ensure a smooth recovery and reduce risks of complications.  Postoperative instructions include information on: diet, wound care, medications and physical activity.  AFTER SURGERY Expect to go home after the procedure.  In some cases, you may need to spend one night in the hospital for observation.  DIET Breast surgery does not require a specific diet.  However, the healthier you eat the better your body can start healing. It is important to increasing your protein intake.  This means limiting the foods with sugar and carbohydrates.  Focus on vegetables and some meat.  If you have any liposuction during your procedure be sure to drink water.  If your urine is bright yellow, then it is concentrated, and you need to drink more water.  As a general rule after surgery, you should have 8 ounces of water every hour while awake.  If you find you are persistently nauseated or unable to take in liquids let us know.  NO TOBACCO USE or EXPOSURE.  This will slow your healing process and increase the risk of a wound.  WOUND CARE Leave the ACE wrap or binder on for 3 days . Use fragrance free soap.   After 3 days you can remove the ACE wrap or binder to shower. Once dry apply ACE wrap, binder or sports bra.  Use a mild soap like Dial, Dove and [place]. You may have Topifoam or Lipofoam on.  It is soft and spongy and helps keep you from getting creases if you have liposuction.  This can be removed before the shower and then replaced.  If you need more it is available on [employer] (Lipofoam). If you have steri-strips / tape directly attached to your skin leave them in place. It is OK to get these wet.   No baths, pools or hot tubs for four  weeks. We close your incision to leave the smallest and best-looking scar. No ointment or creams on your incisions until given the go ahead.  Especially not Neosporin (Too many skin reactions with this one).  A few weeks after surgery you can use Mederma and start massaging the scar. We ask you to wear your binder or sports bra for the first 6 weeks around the clock, including while sleeping. This provides added comfort and helps reduce the fluid accumulation at the surgery site.  ACTIVITY No heavy lifting until cleared by the doctor.  This usually means no more than a half-gallon of milk.  It is OK to walk and climb stairs. In fact, moving your legs is very important to decrease your risk of a blood clot.  It will also help keep you from getting deconditioned.  Every 1 to 2 hours get up and walk for 5 minutes. This will help with a quicker recovery back to normal.  Let pain be your guide so you don't do too much.  This is not the time for spring cleaning and don't plan on taking care of anyone else.  This time is for you to recover,  You will be more comfortable if you sleep and rest with your head elevated either with a few pillows under you or in a recliner.  No stomach sleeping for a three months.  WORK Everyone  returns to work at different times. As a rough guide, most people take at least 1 - 2 weeks off prior to returning to work. If you need documentation for your job, bring the forms to your postoperative follow up visit.  DRIVING Arrange for someone to bring you home from the hospital.  You may be able to drive a few days after surgery but not while taking any narcotics or valium.  BOWEL MOVEMENTS Constipation can occur after anesthesia and while taking pain medication.  It is important to stay ahead for your comfort.  We recommend taking Milk of Magnesia (2 tablespoons; twice a day) while taking the pain pills.  MEDICATIONS You may be prescribed should start after surgery At your  preoperative visit for you history and physical you may have been given the following medications: An antibiotic: Start this medication when you get home and take according to the instructions on the bottle. Zofran 4 mg:  This is to treat nausea and vomiting.  You can take this every 6 hours as needed and only if needed. Valium 2 mg: This is for muscle tightness if you have an implant or expander. This will help relax your muscle which also helps with pain control.  This can be taken every 12 hours as needed. Don't drive after taking this medication. Oxycodone 5 mg:  This is only to be used after you have taken the motrin or the tylenol. Every 6-8 hours as needed.  5. Neurontin - This is helpful for nerve pain, this can cause sedation. Do not recommend taking at same time of oxycodone 5mg . Use as needed every 12 hours.  Over the counter Medication to take: Ibuprofen (Motrin) 600 mg:  Take this every 6 hours.  If you have additional pain then take 500 mg of the tylenol every 8 hours.  Only take the Norco after you have tried these two. Miralax or stool softener of choice: Take this according to the bottle if you take the [place] Call your surgeon's office if any of the following occur: Fever 101 degrees F or greater Excessive bleeding or fluid from the incision site. Pain that increases over time without aid from the medications Redness, warmth, or pus draining from incision sites Persistent nausea or inability to take in liquids Severe misshapen area that underwent the operation.  Here are some resources:  Plastic surgery website: https://www.plasticsurgery.org/for-medical-professionals/education-and-resources/publications/breast-reconstruction-magazine Breast Reconstruction Awareness Campaign:  [employer] Plastic surgery Implant information:  https://www.plasticsurgery.org/patient-safety/breast-implant-safety

## 2021-09-16 NOTE — H&P (Signed)
PROVIDER: Landry Corporal, MD  MRN: [id number] DOB: 1970-10-02 Subjective  Plan  Chief Complaint: Breast Cancer   History of Present Illness: Diana Tate is a 51 y.o. female who is seen today as an office consultation at the request of Dr. Pasty Arch for evaluation of Breast Cancer .   We are asked to see the patient in consultation by Dr. Sallee Lange to evaluate her for a new right breast cancer. The patient is a 51 year old white female who recently went for a biopsy of an area in the upper outer quadrant of the right breast that came back as DCIS that was ER and PR positive. She underwent an MRI which also showed a 12 cm area of enhancement in the lower outer quadrant of the right breast as well as a 1.1 cm mass in the upper inner quadrant of the left breast. All of these areas have been biopsied and the pathology is still pending. She is otherwise in good health and does not smoke.  Review of Systems: A complete review of systems was obtained from the patient. I have reviewed this information and discussed as appropriate with the patient. See HPI as well for other ROS.  ROS   Medical History: Past Medical History:  Diagnosis Date   GERD (gastroesophageal reflux disease)   Sleep apnea   Patient Active Problem List  Diagnosis   Ductal carcinoma in situ (DCIS) of right breast   Past Surgical History:  Procedure Laterality Date   CHOLECYSTECTOMY   MASTECTOMY PARTIAL / LUMPECTOMY   tonsilectomy    No Known Allergies  Current Outpatient Medications on File Prior to Visit  Medication Sig Dispense Refill   omega-3s/dha/epa/fish oil/D3 (VITAMIN-D + OMEGA-3 ORAL) 1 tablet   vit B complex no.12/niacin,B3, (VITAMIN B COMPLEX NO.12-NIACIN ORAL) 1 tablet   No current facility-administered medications on file prior to visit.   Family History  Problem Relation Age of Onset   High blood pressure (Hypertension) Mother    Social History   Tobacco Use  Smoking Status Former    Types: Cigarettes   Quit date: 2017   Years since quitting: 5.9  Smokeless Tobacco Never    Social History   Socioeconomic History   Marital status: Married  Tobacco Use   Smoking status: Former  Types: Cigarettes  Quit date: 2017  Years since quitting: 5.9   Smokeless tobacco: Never  Substance and Sexual Activity   Alcohol use: Yes   Drug use: Defer   Sexual activity: Yes   Objective:  There were no vitals filed for this visit.  There is no height or weight on file to calculate BMI.  Physical Exam Vitals reviewed.  Constitutional:  General: She is not in acute distress. Appearance: Normal appearance.  HENT:  Head: Normocephalic and atraumatic.  Right Ear: External ear normal.  Left Ear: External ear normal.  Nose: Nose normal.  Mouth/Throat:  Mouth: Mucous membranes are moist.  Pharynx: Oropharynx is clear.  Eyes:  General: No scleral icterus. Extraocular Movements: Extraocular movements intact.  Conjunctiva/sclera: Conjunctivae normal.  Pupils: Pupils are equal, round, and reactive to light.  Cardiovascular:  Rate and Rhythm: Normal rate and regular rhythm.  Pulses: Normal pulses.  Heart sounds: Normal heart sounds.  Pulmonary:  Effort: Pulmonary effort is normal. No respiratory distress.  Breath sounds: Normal breath sounds.  Abdominal:  General: Bowel sounds are normal.  Palpations: Abdomen is soft.  Tenderness: There is no abdominal tenderness.  Musculoskeletal:  General: No swelling, tenderness or  deformity. Normal range of motion.  Cervical back: Normal range of motion and neck supple.  Skin: General: Skin is warm and dry.  Coloration: Skin is not jaundiced.  Neurological:  General: No focal deficit present.  Mental Status: She is alert and oriented to person, place, and time.  Psychiatric:  Mood and Affect: Mood normal.  Behavior: Behavior normal.     Breast: There is a moderate size palpable bruise in the lower outer quadrant of the right  breast. Other than this there is no palpable mass in either breast. There is no palpable axillary, supraclavicular, or cervical lymphadenopathy.  Labs, Imaging and Diagnostic Testing:  Assessment and Plan:   Diagnoses and all orders for this visit:  Ductal carcinoma in situ (DCIS) of right breast - Ambulatory Referral to Oncology-Medical - Ambulatory Referral to Physical Therapy - Ambulatory Referral to Radiation Oncology - Ambulatory Referral to Plastic Surgery - [facility] Case Posting Request; Future    The patient appears to have an area of ductal carcinoma in situ in the upper outer quadrant of the right breast as well as a large area of similar enhancement encompassing most of the lower outer quadrant of the right breast. The path on this is still pending. She also has a smaller 1.1 cm mass in the upper inner quadrant of the left breast. Given all of these findings she is favoring bilateral mastectomies with reconstruction. I have discussed with her in detail the risks and benefits of the operation as well as some of the technical aspects and she understands and wishes to proceed. We will refer her to Dr. Marla Roe in plastic surgery to consider reconstruction. We will refer her to medical and radiation oncology to discuss adjuvant therapy. We will refer her to physical therapy for preoperative evaluation. We will call her with the rest of her pathology once it is back   The patient has bilateral dcis. She has elected for bilateral mastectomy with sentinel node biopsy

## 2021-09-16 NOTE — Op Note (Signed)
09/16/2021  11:09 AM  PATIENT:  Diana Tate  51 y.o. female  PRE-OPERATIVE DIAGNOSIS:  BILATERAL DUCTAL CARCINOMA IN SITU  POST-OPERATIVE DIAGNOSIS:  BILATERAL DUCTAL CARCINOMA IN SITU  PROCEDURE:  Procedure(s): BILATERAL TOTAL MASTECTOMY (Bilateral) BILATERAL DEEP AXILLARY SENTINEL NODE BIOPSY (Bilateral)  SURGEON:  Surgeon(s) and Role: Panel 1:    * Jovita Kussmaul, MD - Primary  PHYSICIAN ASSISTANT:   ASSISTANTS: Pryor Curia, RNFA   ANESTHESIA:   general  EBL:  20 mL   BLOOD ADMINISTERED:none  DRAINS: none   LOCAL MEDICATIONS USED:  NONE  SPECIMEN:  Source of Specimen:  right mastectomy and sentinel node, left mastectomy and sentinel node x 3  DISPOSITION OF SPECIMEN:  PATHOLOGY  COUNTS:  YES  TOURNIQUET:  * No tourniquets in log *  DICTATION: .Dragon Dictation  After informed consent was obtained the patient was brought to the operating room and placed in the supine position on the operating table.  After adequate induction of general anesthesia the patient's bilateral chest, breast, and axillary areas were prepped with ChloraPrep, allowed to dry, and draped in usual sterile manner.  An appropriate timeout was performed.  On each side, 2 cc of iron oxide were injected in the subareolar plexus and the breast was massaged for 5 minutes.  Attention was first turned to the right breast.  An elliptical incision was made around the nipple and areola complex in order to spare a good portion of the skin.  The incision was carried through the skin and subcutaneous tissue sharply with the PlasmaBlade.  Breast hooks were used to elevate the skin flaps anteriorly towards the ceiling.  Thin skin flaps were then created by dissecting between the breast tissue and the subcutaneous fat and skin.  This dissection was carried circumferentially all the way to the chest wall muscle.  Next the breast was removed from the pectoralis muscle with the pectoralis fascia.  Once this  was accomplished the entire right breast was removed.  It was marked with a stitch on the lateral skin and sent to pathology for further evaluation.  Of note the breast tissue seem very stiff and fibrotic and the tissue plane was fairly thin.  The mag trace was then used to identify a signal in the deep right axilla.  This area was excised sharply with the PlasmaBlade and the surrounding small vessels and lymphatics were controlled with clips.  This was sent as sentinel node #1.  No other hot or palpable nodes were identified in the right axilla.  Hemostasis was achieved using the PlasmaBlade.  The wound was then packed with a moistened lap sponge.  Attention was then turned to the left breast.  A similar elliptical incision was made around the nipple and areolar complex with a 10 blade knife in order to minimize the excess skin.  The incision was carried through the skin and subcutaneous tissue sharply with the PlasmaBlade.  Breast hooks were then used to elevate the skin flaps anteriorly towards the ceiling.  Thin skin flaps were then created by dissecting between the breast tissue and the subcutaneous fat and skin.  The breast on the left side also had a very fibrotic tight feel in the skin flap plane was fairly thin.  This dissection was carried all the way to the chest wall circumferentially.  Next the breast was removed from the pectoralis muscle with the pectoralis fashion.  Once this was accomplished the entire left breast was removed from the patient.  It  was marked with a stitch on the lateral skin and sent to pathology for further evaluation.  The mag trace was then used to identify a signal in the left axilla.  I was able to identify 3 lymph nodes that had signal.  Each 1 was excised sharply with the PlasmaBlade and the surrounding small vessels and lymphatics were controlled with clips.  No other hot or palpable nodes were identified in the deep left axilla.  These were sent as sentinel nodes numbers 1  through 3.  Hemostasis was achieved using the PlasmaBlade.  The wound was packed with a moistened lap sponge.  At this point the patient tolerated the procedure well.  All needle sponge and instrument counts were correct.  The operation was then turned over to Dr. Marla Roe for the reconstruction.  Her portion will be dictated separately.  PLAN OF CARE: Admit for overnight observation  PATIENT DISPOSITION:  PACU - hemodynamically stable.   Delay start of Pharmacological VTE agent (>24hrs) due to surgical blood loss or risk of bleeding: no

## 2021-09-16 NOTE — Anesthesia Procedure Notes (Signed)
Anesthesia Regional Block: Pectoralis block   Pre-Anesthetic Checklist: , timeout performed,  Correct Patient, Correct Site, Correct Laterality,  Correct Procedure, Correct Position, site marked,  Risks and benefits discussed,  Surgical consent,  Pre-op evaluation,  At surgeon's request and post-op pain management  Laterality: Right  Prep: chloraprep       Needles:  Injection technique: Single-shot  Needle Type: Echogenic Stimulator Needle     Needle Length: 9cm  Needle Gauge: 21     Additional Needles:   Procedures:,,,, ultrasound used (permanent image in chart),,    Narrative:  Start time: 09/16/2021 8:20 AM End time: 09/16/2021 8:30 AM Injection made incrementally with aspirations every 5 mL.  Performed by: Personally  Anesthesiologist: Murvin Natal, MD  Additional Notes: Functioning IV was confirmed and monitors were applied.  A timeout was performed. Sterile prep, hand hygiene and sterile gloves were used. A 24mm 21ga Arrow echogenic stimulator needle was used. Negative aspiration and negative test dose prior to incremental administration of local anesthetic. The patient tolerated the procedure well.  Ultrasound guidance: relevent anatomy identified, needle position confirmed, local anesthetic spread visualized around nerve(s), vascular puncture avoided.  Image printed for medical record.

## 2021-09-16 NOTE — Anesthesia Postprocedure Evaluation (Signed)
Anesthesia Post Note  Patient: Diana Tate  Procedure(s) Performed: BILATERAL TOTAL MASTECTOMY (Bilateral: Breast) BILATERAL AXILLARY SENTINEL NODE BIOPSY (Bilateral: Breast) BREAST RECONSTRUCTION WITH PLACEMENT OF TISSUE EXPANDER AND FLEX HD (ACELLULAR HYDRATED DERMIS) (Bilateral: Breast)     Patient location during evaluation: PACU Anesthesia Type: Regional and General Level of consciousness: awake Pain management: pain level controlled Vital Signs Assessment: post-procedure vital signs reviewed and stable Respiratory status: spontaneous breathing, nonlabored ventilation, respiratory function stable and patient connected to nasal cannula oxygen Cardiovascular status: blood pressure returned to baseline and stable Postop Assessment: no apparent nausea or vomiting Anesthetic complications: no   No notable events documented.  Last Vitals:  Vitals:   09/16/21 1501 09/16/21 1958  BP: 140/87 119/70  Pulse: 84 94  Resp: 15 17  Temp: 36.9 C 36.9 C  SpO2: 97% 97%    Last Pain:  Vitals:   09/16/21 1958  TempSrc: Oral  PainSc:                  Karyl Kinnier [place]

## 2021-09-16 NOTE — Anesthesia Procedure Notes (Signed)
Procedure Name: Intubation Date/Time: 09/16/2021 8:56 AM Performed by: Valda Favia, CRNA Pre-anesthesia Checklist: Patient identified, Emergency Drugs available, Suction available, Patient being monitored and Timeout performed Patient Re-evaluated:Patient Re-evaluated prior to induction Oxygen Delivery Method: Circle system utilized Preoxygenation: Pre-oxygenation with 100% oxygen Induction Type: IV induction Ventilation: Mask ventilation without difficulty and Oral airway inserted - appropriate to patient size Laryngoscope Size: Mac, 4, Glidescope and 3 Grade View: Grade III Tube type: Oral Tube size: 7.0 mm Number of attempts: 3 Airway Equipment and Method: Video-laryngoscopy and Stylet Placement Confirmation: ETT inserted through vocal cords under direct vision, positive ETCO2 and breath sounds checked- equal and bilateral Secured at: 20 cm Tube secured with: Tape Dental Injury: Teeth and Oropharynx as per pre-operative assessment  Comments: DLx1 A. Neyland Pettengill, CRNA Grade III view attempt to pass oral ETT ETCO2 negative no chest rise oral airway placed mask ventilation resumed VSS, DLx 2 A. Iram Lundberg Grade III view no attempt to pass ETT oral airway inserted mask ventilation resumed, DLx3 by Perfecto Kingdom, MD with Glidescope 3 Grade II view successful placement of oral ETT +ETCO2, bilateral breath sounds present and equal

## 2021-09-17 ENCOUNTER — Encounter (HOSPITAL_COMMUNITY): Payer: Self-pay | Admitting: General Surgery

## 2021-09-17 MED ORDER — GABAPENTIN 100 MG PO CAPS
100.0000 mg | ORAL_CAPSULE | Freq: Three times a day (TID) | ORAL | Status: DC
  Administered 2021-09-17: 100 mg via ORAL
  Filled 2021-09-17: qty 1

## 2021-09-17 MED ORDER — OXYCODONE HCL 5 MG PO TABS
5.0000 mg | ORAL_TABLET | ORAL | Status: DC | PRN
  Administered 2021-09-17 (×2): 5 mg via ORAL
  Filled 2021-09-17 (×2): qty 1

## 2021-09-17 MED ORDER — GABAPENTIN 100 MG PO CAPS: 100.0000 mg | ORAL_CAPSULE | Freq: Two times a day (BID) | ORAL | 0 refills | Status: DC

## 2021-09-17 NOTE — Discharge Summary (Signed)
Physician Discharge Summary  Patient ID: Diana Tate MRN: [id number] DOB/AGE: 18-Dec-1970 51 y.o.  Admit date: 09/16/2021 Discharge date: 09/17/2021  Admission Diagnoses: Breast cancer  Discharge Diagnoses:  Principal Problem:   Breast cancer [facility])   Discharged Condition: good  Hospital Course: 51 year old female presented to the [facility] operating room on 09/16/2021 for bilateral mastectomy and bilateral deep axillary sentinel lymph node biopsies by Dr. Marlou Starks followed by immediate bilateral breast reconstruction and placement of tissue expanders with Flex HD with Dr. Marla Roe.  She stayed overnight for observation and pain control.  She reports that she had a lot of pain after surgery and the only medication that helped control pain was Dilaudid.  She reports that she has a great appetite.  She is not having any infectious symptoms.  Given her pain, we opted to try a trial of p.o. medications today and to discuss possible discharge this afternoon.  Patient reports that she is feeling much better, Neurontin was added to her pain regimen.  She would like to go home.  She is very confident that she can tolerate her current level of pain at home.  Consults: None  Significant Diagnostic Studies: None  Treatments: IV hydration, antibiotics: Ancef, and analgesia: acetaminophen, Dilaudid, and oxycodone and Neurontin  Discharge Exam: Blood pressure 100/69, pulse 84, temperature 98.5 F (36.9 C), temperature source Oral, resp. rate 19, height 5' 5.5" (1.664 m), weight 81.6 kg, last menstrual period 01/16/2009, SpO2 99 %. General appearance: alert, cooperative, no distress, and resting in bed, husband at bedside Breasts: Bilateral breast with dressings in place, bilateral JP drains are in place.  No erythema or cellulitic changes noted.  No subcutaneous fluid collections noted with palpation.  Bilateral JP drains with serosanguineous drainage in bulbs.  No ecchymosis noted.  No skin  necrosis is noted.  Good capillary refill of bilateral mastectomy flaps. Extremities: extremities normal, atraumatic, no cyanosis or edema and SCDs in place Pulses: 2+ and symmetric Skin: Skin color, texture, turgor normal. No rashes or lesions   Disposition: Discharge disposition: 01-Home or Self Care       Discharge Instructions     Call MD for:  difficulty breathing, headache or visual disturbances   Complete by: As directed    Call MD for:  extreme fatigue   Complete by: As directed    Call MD for:  hives   Complete by: As directed    Call MD for:  persistant dizziness or light-headedness   Complete by: As directed    Call MD for:  persistant nausea and vomiting   Complete by: As directed    Call MD for:  redness, tenderness, or signs of infection (pain, swelling, redness, odor or green/yellow discharge around incision site)   Complete by: As directed    Call MD for:  severe uncontrolled pain   Complete by: As directed    Call MD for:  temperature >100.4   Complete by: As directed    Diet - low sodium heart healthy   Complete by: As directed    Increase activity slowly   Complete by: As directed       Allergies as of 09/17/2021       Reactions   Tape Dermatitis   Patient states she has a problem with band aids but tape is ok   Lexapro [escitalopram Oxalate] Nausea Only        Medication List     TAKE these medications    diazepam 2 MG tablet  Commonly known as: Valium Take 1 tablet (2 mg total) by mouth every 12 (twelve) hours as needed for muscle spasms.   gabapentin 100 MG capsule Commonly known as: Neurontin Take 1 capsule (100 mg total) by mouth 2 (two) times daily.   ondansetron 4 MG disintegrating tablet Commonly known as: ZOFRAN-ODT Take 1 tablet (4 mg total) by mouth every 8 (eight) hours as needed for nausea or vomiting.   pantoprazole 20 MG tablet Commonly known as: PROTONIX Take 20 mg by mouth daily as needed for heartburn or  indigestion.   Vitamin B 12 500 MCG Tabs Take 500 mcg by mouth daily.   Vitamin D 125 MCG (5000 UT) Caps Take 5,000 Units by mouth daily.        Follow-up Information     Dillingham, Loel Lofty, DO Follow up in 10 day(s).   Specialty: Plastic Surgery Contact information: [address] [place] [address] [facility] [address] [place], [place] [address] [phone]  Signed: Carola Rhine Devontay Celaya 09/17/2021, 12:02 PM

## 2021-09-17 NOTE — Plan of Care (Signed)
°  Problem: Nutrition: Goal: Adequate nutrition will be maintained Outcome: Adequate for Discharge   Problem: Pain Managment: Goal: General experience of comfort will improve Outcome: Adequate for Discharge   Problem: Safety: Goal: Ability to remain free from injury will improve Outcome: Adequate for Discharge   Problem: Skin Integrity: Goal: Risk for impaired skin integrity will decrease Outcome: Adequate for Discharge   Problem: Education: Goal: Knowledge of disease or condition will improve Outcome: Adequate for Discharge   Problem: Activity: Goal: Ability to maintain or regain function will improve Outcome: Adequate for Discharge   Problem: Clinical Measurements: Goal: Postoperative complications will be avoided or minimized Outcome: Adequate for Discharge   Problem: Self-Concept: Goal: Ability to verbalize positive feelings about self will improve Outcome: Adequate for Discharge   Problem: Pain Management: Goal: Expressions of feelings of enhanced comfort will increase Outcome: Adequate for Discharge   Problem: Skin Integrity: Goal: Demonstration of wound healing without infection will improve Outcome: Adequate for Discharge

## 2021-09-17 NOTE — Progress Notes (Signed)
1 Day Post-Op   Subjective/Chief Complaint: Complains of lots of pain overnight   Objective: Vital signs in last 24 hours: Temp:  [96.6 F (35.9 C)-98.5 F (36.9 C)] 97.7 F (36.5 C) (01/31 0443) Pulse Rate:  [74-94] 85 (01/31 0443) Resp:  [10-18] 18 (01/31 0443) BP: (101-140)/(60-87) 103/60 (01/31 0443) SpO2:  [96 %-100 %] 99 % (01/31 0443)    Intake/Output from previous day: 01/30 0701 - 01/31 0700 In: 2714.4 [I.V.:2512.3; IV Piggyback:202.1] Out: 660 [Urine:425; Drains:215; Blood:20] Intake/Output this shift: No intake/output data recorded.  General appearance: alert and cooperative Resp: clear to auscultation bilaterally Chest wall: skin flaps viable Cardio: regular rate and rhythm GI: soft, non-tender; bowel sounds normal; no masses,  no organomegaly  Lab Results:  No results for input(s): WBC, HGB, HCT, PLT in the last 72 hours. BMET No results for input(s): NA, K, CL, CO2, GLUCOSE, BUN, CREATININE, CALCIUM in the last 72 hours. PT/INR No results for input(s): LABPROT, INR in the last 72 hours. ABG No results for input(s): PHART, HCO3 in the last 72 hours.  Invalid input(s): PCO2, PO2  Studies/Results: No results found.  Anti-infectives: Anti-infectives (From admission, onward)    Start     Dose/Rate Route Frequency Ordered Stop   09/16/21 1600  ceFAZolin (ANCEF) IVPB 2g/100 mL premix        2 g 200 mL/hr over 30 Minutes Intravenous Every 8 hours 09/16/21 1216 09/23/21 1559   09/16/21 1115  ceFAZolin 1 g / gentamicin 80 mg in NS 500 mL surgical irrigation  Status:  Discontinued          As needed 09/16/21 1115 09/16/21 1219   09/16/21 0645  ceFAZolin (ANCEF) IVPB 2g/100 mL premix        2 g 200 mL/hr over 30 Minutes Intravenous On call to O.R. 09/16/21 2774 09/16/21 0911   09/16/21 0645  ceFAZolin (ANCEF) IVPB 2g/100 mL premix  Status:  Discontinued        2 g 200 mL/hr over 30 Minutes Intravenous On call to O.R. 09/16/21 0641 09/16/21 1454        Assessment/Plan: s/p Procedure(s): BILATERAL TOTAL MASTECTOMY (Bilateral) BREAST RECONSTRUCTION WITH PLACEMENT OF TISSUE EXPANDER AND FLEX HD (ACELLULAR HYDRATED DERMIS) (Bilateral) BILATERAL AXILLARY SENTINEL NODE BIOPSY (Bilateral) Advance diet Will add neurontin for pain control May need to stay another day until pain is under control  LOS: 1 day    Autumn Messing III 09/17/2021

## 2021-09-17 NOTE — Progress Notes (Signed)
Breast cancer bag given and explained to pt. Pt states husband understands how to empty JP drains and I showed her the record to keep up with the drain output and take back to the follow up visit.

## 2021-09-18 LAB — SURGICAL PATHOLOGY

## 2021-09-23 NOTE — Progress Notes (Signed)
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 with above-mentioned history of bilateral DCIS. Bilateral mastectomies on 09/06/2021 showed 2 foci of intermediate grade DCIS in the right breast, intermediate grade DCIS in the left breast, and all axillary lymph nodes negative for carcinoma. She presents to the clinic today for follow-up.  She is healing and recovering well from recent surgery.  ALLERGIES:  is allergic to tape and lexapro [escitalopram oxalate].  MEDICATIONS:  Current Outpatient Medications  Medication Sig Dispense Refill   Cholecalciferol (VITAMIN D) 125 MCG (5000 UT) CAPS Take 5,000 Units by mouth daily.     Cyanocobalamin (VITAMIN B 12) 500 MCG TABS Take 500 mcg by mouth daily.     diazepam (VALIUM) 2 MG tablet Take 1 tablet (2 mg total) by mouth every 12 (twelve) hours as needed for muscle spasms. 20 tablet 0   gabapentin (NEURONTIN) 100 MG capsule Take 1 capsule (100 mg total) by mouth 2 (two) times daily. 15 capsule 0   ondansetron (ZOFRAN-ODT) 4 MG disintegrating tablet Take 1 tablet (4 mg total) by mouth every 8 (eight) hours as needed for nausea or vomiting. 20 tablet 0   pantoprazole (PROTONIX) 20 MG tablet Take 20 mg by mouth daily as needed for heartburn or indigestion.     No current facility-administered medications for this visit.    PHYSICAL EXAMINATION: ECOG PERFORMANCE STATUS: 1 -  Symptomatic but completely ambulatory  Vitals:   09/24/21 1142  BP: (!) 157/100  Pulse: 76  Temp: 97.9 F (36.6 C)  SpO2: 99%   Filed Weights   09/24/21 1142  Weight: 193 lb 8 oz (87.8 kg)      LABORATORY DATA:  I have reviewed the data as listed CMP Latest Ref Rng & Units 01/25/2021 01/11/2020 01/09/2020  Glucose 65 - 99 mg/dL 93 68 107(H)  BUN 6 - 24 mg/dL 21 11 8   Creatinine 0.57 - 1.00 mg/dL 0.96 0.65 0.64  Sodium 134 - 144 mmol/L 139 138 137  Potassium 3.5 - 5.2 mmol/L 5.1 4.5 4.6  Chloride 96 - 106 mmol/L 103 100 102  CO2 20 - 29 mmol/L 22 25 27   Calcium 8.7 - 10.2 mg/dL 10.0 9.4 9.9  Total Protein 6.0 - 8.5 g/dL 7.0 6.6 6.9  Total Bilirubin 0.0 - 1.2 mg/dL 0.4 0.3 1.3(H)  Alkaline Phos 44 - 121 IU/L 85 80 67  AST 0 - 40 IU/L 10 9 24   ALT 0 - 32 IU/L 16 11 13     Lab Results  Component Value Date   WBC 8.9 09/11/2021   HGB 12.7 09/11/2021   HCT 40.8 09/11/2021   MCV 90.3 09/11/2021   PLT 244 09/11/2021   NEUTROABS 5.6 01/25/2021    ASSESSMENT & PLAN:  Ductal carcinoma in situ (DCIS) of right breast 09/16/2021: Bilateral mastectomies for bilateral DCIS Right mastectomy: 3 foci of DCIS IG 2.1 cm, 1.3 cm  and 1.6 cm, margins negative, 0/6 lymph nodes negative, ER 95%, PR 100% Left mastectomy: Intermediate grade DCIS 1.2 cm, margins negative, no invasive cancer, 0/6 lymph nodes negative, ER 95%, PR greater than 95%  Pathology counseling: I discussed the final pathology report of the patient provided  a copy of this report. I discussed the margins.  We also discussed the final staging along with previously performed ER/PR testing.  Since there is no evidence of invasive cancer, there is no role of adjuvant antiestrogen therapy or radiation. Return to clinic on an as-needed basis.    No orders of the defined types were placed in this encounter.  The patient has a good understanding of the overall plan. she agrees with it. she will call with any problems that may  develop before the next visit here.  Total time spent: 30 mins including face to face time and time spent for planning, charting and coordination of care  Rulon Eisenmenger, MD, MPH 09/24/2021  I, Thana Ates, am acting as scribe for Dr. Nicholas Lose.  I have reviewed the above documentation for accuracy and completeness, and I agree with the above.

## 2021-09-24 ENCOUNTER — Inpatient Hospital Stay: Payer: BC Managed Care – PPO | Attending: Hematology and Oncology | Admitting: Hematology and Oncology

## 2021-09-24 DIAGNOSIS — D0511 Intraductal carcinoma in situ of right breast: Secondary | ICD-10-CM | POA: Diagnosis not present

## 2021-09-24 DIAGNOSIS — Z79899 Other long term (current) drug therapy: Secondary | ICD-10-CM | POA: Diagnosis not present

## 2021-09-24 DIAGNOSIS — Z9013 Acquired absence of bilateral breasts and nipples: Secondary | ICD-10-CM | POA: Insufficient documentation

## 2021-09-24 NOTE — Assessment & Plan Note (Signed)
09/16/2021: Bilateral mastectomies for bilateral DCIS Right mastectomy: 3 foci of DCIS IG 2.1 cm, 1.3 cm and 1.6 cm, margins negative, 0/6 lymph nodes negative, ER 95%, PR 100% Left mastectomy: Intermediate grade DCIS 1.2 cm, margins negative, no invasive cancer, 0/6 lymph nodes negative, ER 95%, PR greater than 95%  Pathology counseling: I discussed the final pathology report of the patient provided  a copy of this report. I discussed the margins.  We also discussed the final staging along with previously performed ER/PR testing.  Since there is no evidence of invasive cancer, there is no role of adjuvant antiestrogen therapy or radiation. She can be seen by long-term survivorship clinic for annual follow-ups.  We will set her up for survivorship care plan visit in 3 months.

## 2021-09-25 ENCOUNTER — Encounter: Payer: Self-pay | Admitting: *Deleted

## 2021-09-25 DIAGNOSIS — D0511 Intraductal carcinoma in situ of right breast: Secondary | ICD-10-CM

## 2021-09-27 ENCOUNTER — Encounter: Payer: Self-pay | Admitting: Plastic Surgery

## 2021-09-27 ENCOUNTER — Other Ambulatory Visit: Payer: Self-pay

## 2021-09-27 ENCOUNTER — Ambulatory Visit (INDEPENDENT_AMBULATORY_CARE_PROVIDER_SITE_OTHER): Payer: BC Managed Care – PPO | Admitting: Plastic Surgery

## 2021-09-27 DIAGNOSIS — Z9013 Acquired absence of bilateral breasts and nipples: Secondary | ICD-10-CM

## 2021-09-27 DIAGNOSIS — Z9010 Acquired absence of unspecified breast and nipple: Secondary | ICD-10-CM | POA: Insufficient documentation

## 2021-09-27 NOTE — Progress Notes (Signed)
° °  Subjective:    Patient ID: Diana Tate, female    DOB: 1970-09-07, 51 y.o.   MRN: [id number]  The patient is a 51 year old female here for follow-up after undergoing bilateral mastectomies.  We were able to put 100 cc in the expanders at the time of surgery.  The patient's output has been about 30/day for the last couple of days.  She is a little bit tight but overall she is doing really well.  Her pain is controlled.  She has great support at home.  No sign of seroma or infection.     Review of Systems  Constitutional:  Positive for activity change. Negative for appetite change.  Eyes: Negative.   Respiratory: Negative.    Cardiovascular: Negative.   Endocrine: Negative.   Genitourinary: Negative.       Objective:   Physical Exam Constitutional:      Appearance: Normal appearance.  HENT:     Head: Normocephalic.  Cardiovascular:     Rate and Rhythm: Normal rate.     Pulses: Normal pulses.  Neurological:     Mental Status: She is alert and oriented to person, place, and time.  Psychiatric:        Mood and Affect: Mood normal.        Behavior: Behavior normal.       Assessment & Plan:     ICD-10-CM   1. Acquired absence of both breasts  Z90.13       We placed injectable saline in the Expander using a sterile technique: Right: 50 cc for a total of 150 / 535 cc Left: 50 cc for a total of 150 / 535 cc  We will plan to take the drains out early next week.

## 2021-10-01 ENCOUNTER — Ambulatory Visit: Payer: BC Managed Care – PPO | Admitting: Surgical

## 2021-10-01 ENCOUNTER — Ambulatory Visit (INDEPENDENT_AMBULATORY_CARE_PROVIDER_SITE_OTHER): Payer: BC Managed Care – PPO | Admitting: Surgical

## 2021-10-01 ENCOUNTER — Other Ambulatory Visit: Payer: Self-pay

## 2021-10-01 DIAGNOSIS — Z9013 Acquired absence of bilateral breasts and nipples: Secondary | ICD-10-CM

## 2021-10-01 DIAGNOSIS — N6099 Unspecified benign mammary dysplasia of unspecified breast: Secondary | ICD-10-CM

## 2021-10-01 DIAGNOSIS — D0511 Intraductal carcinoma in situ of right breast: Secondary | ICD-10-CM

## 2021-10-01 DIAGNOSIS — Z719 Counseling, unspecified: Secondary | ICD-10-CM

## 2021-10-01 NOTE — Progress Notes (Signed)
51 year old female here for follow-up on her bilateral breast reconstruction.  She underwent bilateral total mastectomies and bilateral breast reconstruction on 09/16/2021.  She is 2 weeks postop.   Patient reports overall she is doing well.  She tolerated her last fill last week with no problems.  She is not having any infectious symptoms.  Reports drain output has been approximately 10 cc per 24 hours from each drain.  Chaperone present on exam On exam bilateral breast incisions are intact, no erythema or cellulitic changes.  Mastectomy flaps are viable.  Bilateral JP drains in place with a few cc of serosanguineous drainage noted.  We placed injectable saline in the Expander using a sterile technique: Right: 75 cc for a total of 225 / 535 cc Left: 75 cc for a total of 225 / 535 cc  Recommend following up in 2 weeks for reevaluation and additional fill.

## 2021-10-07 ENCOUNTER — Encounter: Payer: Self-pay | Admitting: Physical Therapy

## 2021-10-07 ENCOUNTER — Ambulatory Visit: Payer: BC Managed Care – PPO | Attending: General Surgery | Admitting: Physical Therapy

## 2021-10-07 ENCOUNTER — Other Ambulatory Visit: Payer: Self-pay

## 2021-10-07 DIAGNOSIS — R293 Abnormal posture: Secondary | ICD-10-CM | POA: Insufficient documentation

## 2021-10-07 DIAGNOSIS — D0512 Intraductal carcinoma in situ of left breast: Secondary | ICD-10-CM | POA: Diagnosis present

## 2021-10-07 DIAGNOSIS — D0511 Intraductal carcinoma in situ of right breast: Secondary | ICD-10-CM | POA: Insufficient documentation

## 2021-10-07 NOTE — Therapy (Signed)
[facility] @ [place], [place], [address] Phone: [phone]   Fax:  [phone]  Physical Therapy Treatment  Patient Details  Name: Diana Tate MRN: [id number] Date of Birth: 17-Sep-1970 Referring Provider (PT): Dr. Marlou Starks   Encounter Date: 10/07/2021   PT End of Session - 10/07/21 1644     Visit Number 2    Number of Visits 10    Date for PT Re-Evaluation 11/04/21    PT Start Time 1604    PT Stop Time 1640    PT Time Calculation (min) 36 min    Activity Tolerance Patient tolerated treatment well    Behavior During Therapy [facility] for tasks assessed/performed             Past Medical History:  Diagnosis Date   Cancer ([place])    GERD (gastroesophageal reflux disease)    Hyperlipidemia    Migraine    Classic   Sleep apnea     Past Surgical History:  Procedure Laterality Date   AXILLARY SENTINEL NODE BIOPSY Bilateral 09/16/2021   Procedure: BILATERAL AXILLARY SENTINEL NODE BIOPSY;  Surgeon: Jovita Kussmaul, MD;  Location: [place];  Service: General;  Laterality: Bilateral;   BREAST BIOPSY Right 08/02/2021   2 areas   BREAST BIOPSY Left 08/02/2021   BREAST EXCISIONAL BIOPSY Right 04/2012   BREAST RECONSTRUCTION WITH PLACEMENT OF TISSUE EXPANDER AND FLEX HD (ACELLULAR HYDRATED DERMIS) Bilateral 09/16/2021   Procedure: BREAST RECONSTRUCTION WITH PLACEMENT OF TISSUE EXPANDER AND FLEX HD (ACELLULAR HYDRATED DERMIS);  Surgeon: Wallace Going, DO;  Location: [place];  Service: Plastics;  Laterality: Bilateral;   BREAST SURGERY     benign   CHOLECYSTECTOMY  2006   ENDOMETRIAL ABLATION  2007   ESOPHAGOGASTRODUODENOSCOPY N/A 05/22/2014   SLF: 1. Schatzki ring was found 2. MIld non-erosive gastritis   TONSILLECTOMY  2000   TOTAL MASTECTOMY Bilateral 09/16/2021   Procedure: BILATERAL TOTAL MASTECTOMY;  Surgeon: Jovita Kussmaul, MD;  Location: [place];  Service: General;  Laterality: Bilateral;    There were  no vitals filed for this visit.   Subjective Assessment - 10/07/21 1606     Subjective I don't raise my arm much especially the left one because the muscles are so tight across my chest. They just put in 75 cc last Tuesday. I have soreness under my armpits. I had the last drain taken out last Tuesday.    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 No/denies    Pain Score 0-No pain                [facility] PT Assessment - 10/07/21 0001       Observation/Other Assessments   Observations steri strips intact over healing bilateral mastectomy scars      Sit to Stand   Comments 30 sec sit to stand: 18 reps      AROM   Right Shoulder Extension 67 Degrees    Right Shoulder Flexion 160 Degrees    Right Shoulder ABduction 175 Degrees    Right Shoulder Internal Rotation 57 Degrees    Right Shoulder External Rotation 81 Degrees    Left Shoulder Extension 61 Degrees    Left Shoulder Flexion 150 Degrees    Left Shoulder ABduction 171 Degrees  Left Shoulder Internal Rotation 60 Degrees    Left Shoulder External Rotation 76 Degrees               LYMPHEDEMA/ONCOLOGY QUESTIONNAIRE - 10/07/21 0001       Surgeries   Mastectomy Date 09/16/21    Sentinel Lymph Node Biopsy Date 09/16/21    Number Lymph Nodes Removed 6   6 on L and 6 on R - all negative     Lymphedema Assessments   Lymphedema Assessments Upper extremities      Right Upper Extremity Lymphedema   15 cm Proximal to Olecranon Process 34.5 cm    Olecranon Process 27.5 cm    15 cm Proximal to Ulnar Styloid Process 26.9 cm    Just Proximal to Ulnar Styloid Process 17 cm    Across Hand at [employer] 19.9 cm    At [place] of 2nd Digit 5.9 cm      Left Upper Extremity Lymphedema   15 cm Proximal to Olecranon Process 34.8 cm    Olecranon Process 28.2 cm    15  cm Proximal to Ulnar Styloid Process 27 cm    Just Proximal to Ulnar Styloid Process 16.7 cm    Across Hand at [employer] 20 cm    At [place] of 2nd Digit 6.1 cm                Quick Dash - 10/07/21 0001     Open a tight or new jar Unable    Do heavy household chores (wash walls, wash floors) Unable    Carry a shopping bag or briefcase Unable    Wash your back Unable    Use a knife to cut food No difficulty    Recreational activities in which you take some force or impact through your arm, shoulder, or hand (golf, hammering, tennis) Unable    During the past week, to what extent has your arm, shoulder or hand problem interfered with your normal social activities with family, friends, neighbors, or groups? Slightly    During the past week, to what extent has your arm, shoulder or hand problem limited your work or other regular daily activities Quite a bit    Arm, shoulder, or hand pain. Mild    Tingling (pins and needles) in your arm, shoulder, or hand Mild    Difficulty Sleeping Moderate difficulty    DASH Score 63.64 %                             PT Education - 10/07/21 1644     Education Details post op breast exercises, ABC class, lymphedema risk reduction practices, scar mobilization after 6 weeks    Person(s) Educated Patient    Methods Explanation;Handout    Comprehension Verbalized understanding                 PT Long Term Goals - 10/07/21 1649       PT LONG TERM GOAL #1   Title Pt will return to baseline shoulder ROM and not demonstrate any signs or symptoms of lymphedema.    Time 6    Period Weeks    Status On-going      PT LONG TERM GOAL #2   Title Pt will report a 50% improvement in feelings of tightness across bilateral chest just inferior to breasts    Time 4    Period Weeks    Status  New    Target Date 11/04/21      PT LONG TERM GOAL #3   Title Pt will demonstrate 160 degrees of L shoulder flexion to allow her to reach  overhead.    Baseline 150    Time 4    Period Weeks    Status New    Target Date 11/04/21                   Plan - 10/07/21 1646     Clinical Impression Statement Pt returns to PT after undergoing bilateral mastectomies for bilateral DCIS breast cancer. She also had bilateral SLNB with 6 nodes removed on L and R - all negative. Pt's ROM is almost back to baseline but is lacking approximately 10 degrees. She also reports tightness across bilateral chest right under breast fold. Educated pt to stretch arms overhead to help decrease tightness in this area. Also encouraged pt to begin post op breast exercises. Pt would benefit from skilled PT services to return ROM to baseline and decrease tightness across chest.    PT Frequency 2x / week    PT Duration 4 weeks    PT Treatment/Interventions ADLs/Self Care Home Management;Patient/family education;Therapeutic exercise;Manual techniques;Manual lymph drainage;Scar mobilization;Passive range of motion    PT Next Visit Plan pt scheduled for 3/6 ABC class, do PROM to bilat sh, pulleys, ball and give supine scap eventually    PT Home Exercise Plan post op breast exercises    Consulted and Agree with Plan of Care Patient             Patient will benefit from skilled therapeutic intervention in order to improve the following deficits and impairments:  Postural dysfunction, Decreased knowledge of precautions, Increased fascial restricitons, Decreased range of motion, Decreased scar mobility  Visit Diagnosis: Abnormal posture  Ductal carcinoma in situ (DCIS) of right breast  Ductal carcinoma in situ (DCIS) of left breast     Problem List Patient Active Problem List   Diagnosis Date Noted   Acquired absence of breast 09/27/2021   Breast cancer ([place]) 09/16/2021   Ductal carcinoma in situ (DCIS) of right breast 08/09/2021   Former smoker 12/07/2020   History of COVID-19 11/21/2019   Obstructive sleep apnea 02/20/2016   Insomnia  12/03/2014   Alternating constipation and diarrhea 05/16/2014   Abdominal pain, epigastric 05/16/2014   Esophageal reflux 12/12/2013   Migraine headache with aura 03/30/2013   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

## 2021-10-07 NOTE — Patient Instructions (Signed)
[facility]  [address], [address]  [place] [address]  [phone]  After Breast Cancer Class It is recommended you attend the ABC class to be educated on lymphedema risk reduction. This class is free of charge and lasts for 1 hour. It is a 1-time class. You will need to download the Webex app either on your phone or computer. We will send you a link the night before or the morning of the class. You should be able to click on that link to join the class. This is not a confidential class. You don't have to turn your camera on, but other participants may be able to see your email address.  Scar massage You can begin gentle scar massage to you incision sites. Gently place one hand on the incision and move the skin (without sliding on the skin) in various directions. Do this for a few minutes and then you can gently massage either coconut oil or vitamin E cream into the scars.  Compression garment You should continue wearing your compression bra until you feel like you no longer have swelling.  Home exercise Program Continue doing the exercises you were given until you feel like you can do them without feeling any tightness at the end.   Walking Program Studies show that 30 minutes of walking per day (fast enough to elevate your heart rate) can significantly reduce the risk of a cancer recurrence. If you can't walk due to other medical reasons, we encourage you to find another activity you could do (like a stationary bike or water exercise).  Posture After breast cancer surgery, people frequently sit with rounded shoulders posture because it puts their incisions on slack and feels better. If you sit like this and scar tissue forms in that position, you can become very tight and have pain sitting or standing with good posture. Try to be aware of your posture and sit and stand up tall to heal properly.  Follow up PT: It is recommended you return every 3 months for the  first 3 years following surgery to be assessed on the SOZO machine for an L-Dex score. This helps prevent clinically significant lymphedema in 95% of patients. These follow up screens are 10 minute appointments that you are not billed for. 

## 2021-10-08 ENCOUNTER — Encounter: Payer: BC Managed Care – PPO | Admitting: Surgical

## 2021-10-09 ENCOUNTER — Other Ambulatory Visit: Payer: Self-pay

## 2021-10-09 ENCOUNTER — Ambulatory Visit: Payer: BC Managed Care – PPO | Admitting: Physical Therapy

## 2021-10-09 ENCOUNTER — Encounter: Payer: Self-pay | Admitting: Physical Therapy

## 2021-10-09 DIAGNOSIS — D0512 Intraductal carcinoma in situ of left breast: Secondary | ICD-10-CM

## 2021-10-09 DIAGNOSIS — D0511 Intraductal carcinoma in situ of right breast: Secondary | ICD-10-CM

## 2021-10-09 DIAGNOSIS — R293 Abnormal posture: Secondary | ICD-10-CM

## 2021-10-09 NOTE — Therapy (Addendum)
[facility] [place] @ [place] [facility] [place], [place], [address] Phone: [phone]   Fax:  [phone]  Physical Therapy Treatment  Patient Details  Name: Diana Tate MRN: [id number] Date of Birth: 1970/11/19 Referring Provider (PT): Dr. Marlou Starks   Encounter Date: 10/09/2021   PT End of Session - 10/09/21 1153     Visit Number 3    Number of Visits 10    Date for PT Re-Evaluation 11/04/21    PT Start Time 1103    PT Stop Time 1146    PT Time Calculation (min) 43 min    Activity Tolerance Patient tolerated treatment well    Behavior During Therapy [facility] for tasks assessed/performed             Past Medical History:  Diagnosis Date   Cancer ([place])    GERD (gastroesophageal reflux disease)    Hyperlipidemia    Migraine    Classic   Sleep apnea     Past Surgical History:  Procedure Laterality Date   AXILLARY SENTINEL NODE BIOPSY Bilateral 09/16/2021   Procedure: BILATERAL AXILLARY SENTINEL NODE BIOPSY;  Surgeon: Jovita Kussmaul, MD;  Location: [place];  Service: General;  Laterality: Bilateral;   BREAST BIOPSY Right 08/02/2021   2 areas   BREAST BIOPSY Left 08/02/2021   BREAST EXCISIONAL BIOPSY Right 04/2012   BREAST RECONSTRUCTION WITH PLACEMENT OF TISSUE EXPANDER AND FLEX HD (ACELLULAR HYDRATED DERMIS) Bilateral 09/16/2021   Procedure: BREAST RECONSTRUCTION WITH PLACEMENT OF TISSUE EXPANDER AND FLEX HD (ACELLULAR HYDRATED DERMIS);  Surgeon: Wallace Going, DO;  Location: [place];  Service: Plastics;  Laterality: Bilateral;   BREAST SURGERY     benign   CHOLECYSTECTOMY  2006   ENDOMETRIAL ABLATION  2007   ESOPHAGOGASTRODUODENOSCOPY N/A 05/22/2014   SLF: 1. Schatzki ring was found 2. MIld non-erosive gastritis   TONSILLECTOMY  2000   TOTAL MASTECTOMY Bilateral 09/16/2021   Procedure: BILATERAL TOTAL MASTECTOMY;  Surgeon: Jovita Kussmaul, MD;  Location: [place];  Service: General;  Laterality: Bilateral;    There were  no vitals filed for this visit.   Subjective Assessment - 10/09/21 1150     Subjective I have been doing the exercises and my arm is moving better. I am more sore under my right armpit.    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 No/denies    Pain Score 0-No pain                [facility] PT Assessment - 10/09/21 0001       AROM   Right Shoulder Flexion 162 Degrees    Left Shoulder Flexion 165 Degrees                           [facility] Adult PT Treatment/Exercise - 10/09/21 0001       Exercises   Exercises Shoulder      Shoulder Exercises: Supine   Horizontal ABduction Both;AAROM;10 reps   pt returned therapist demo, used dowel   Flexion AAROM;Both;10 reps   using dowel, pt returned therapist demo     Shoulder Exercises: Pulleys   Flexion 2 minutes   pt returend therapist demo   ABduction 2 minutes   pt returned therapist demo  Shoulder Exercises: Therapy Ball   Flexion Both;5 reps   with stretch at end range   ABduction Both;5 reps   with stretch at end range     Manual Therapy   Manual Therapy Passive ROM    Passive ROM to bilateral shoulders in to flexion and abduction to pts tolerance                          PT Long Term Goals - 10/07/21 1649       PT LONG TERM GOAL #1   Title Pt will return to baseline shoulder ROM and not demonstrate any signs or symptoms of lymphedema.    Time 6    Period Weeks    Status On-going      PT LONG TERM GOAL #2   Title Pt will report a 50% improvement in feelings of tightness across bilateral chest just inferior to breasts    Time 4    Period Weeks    Status New    Target Date 11/04/21      PT LONG TERM GOAL #3   Title Pt will demonstrate 160 degrees of L shoulder flexion to allow her to reach overhead.    Baseline 150     Time 4    Period Weeks    Status New    Target Date 11/04/21              Plan - 10/07/21 1646       Clinical Impression Statement Began gentle AAROM exercises today to improve ROM followed by manual therapy to improve ROM. Instructed pt ins supine dowel exercises to help improve flexion and abduction and issued these as part of pt's HEP.     PT Frequency 2x / week     PT Duration 4 weeks     PT Treatment/Interventions ADLs/Self Care Home Management;Patient/family education;Therapeutic exercise;Manual techniques;Manual lymph drainage;Scar mobilization;Passive range of motion     PT Next Visit Plan pt scheduled for 3/6 ABC class, do PROM to bilat sh, pulleys, ball and give supine scap eventually     PT Home Exercise Plan post op breast exercises, supine dowel exercises    Consulted and Agree with Plan of Care Patient                   Patient will benefit from skilled therapeutic intervention in order to improve the following deficits and impairments:  Postural dysfunction, Decreased knowledge of precautions, Increased fascial restricitons, Decreased range of motion, Decreased scar mobility      Visit Diagnosis: Abnormal posture  Ductal carcinoma in situ (DCIS) of right breast  Ductal carcinoma in situ (DCIS) of left breast     Problem List Patient Active Problem List   Diagnosis Date Noted   Acquired absence of breast 09/27/2021   Breast cancer ([place]) 09/16/2021   Ductal carcinoma in situ (DCIS) of right breast 08/09/2021   Former smoker 12/07/2020   History of COVID-19 11/21/2019   Obstructive sleep apnea 02/20/2016   Insomnia 12/03/2014   Alternating constipation and diarrhea 05/16/2014   Abdominal pain, epigastric 05/16/2014   Esophageal reflux 12/12/2013   Migraine headache with aura 03/30/2013   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

## 2021-10-15 ENCOUNTER — Other Ambulatory Visit: Payer: Self-pay

## 2021-10-15 ENCOUNTER — Ambulatory Visit (INDEPENDENT_AMBULATORY_CARE_PROVIDER_SITE_OTHER): Payer: BC Managed Care – PPO | Admitting: Surgical

## 2021-10-15 DIAGNOSIS — N6099 Unspecified benign mammary dysplasia of unspecified breast: Secondary | ICD-10-CM

## 2021-10-15 DIAGNOSIS — Z9013 Acquired absence of bilateral breasts and nipples: Secondary | ICD-10-CM

## 2021-10-15 DIAGNOSIS — D0511 Intraductal carcinoma in situ of right breast: Secondary | ICD-10-CM

## 2021-10-15 NOTE — Progress Notes (Signed)
51 year old female here for follow-up on bilateral breast reconstruction.  She underwent bilateral total mastectomies and bilateral breast reconstruction on 09/16/2021.  She is 4 weeks postop.  Chaperone present on exam On exam bilateral breast incisions are intact, no erythema.  She does have subcutaneous fluid collection noted with palpation of bilateral breast, right greater than left.  There is no overlying skin changes noted.  Using a sterile technique, 140 cc was aspirated from the right breast, the fluid was straw-colored.  It was not cloudy.  Using a sterile technique 10 cc of fluid was aspirated from the left breast.  It was also straw-colored.  Patient tolerated this well.  Recommend continue with compressive garments, avoid strenuous activities.  Follow-up in 2 weeks for reevaluation.  We placed injectable saline in the Expander using a sterile technique: Right: 85 cc for a total of 310 / 535 cc Left: 85 cc for a total of 310 / 535 cc

## 2021-10-16 ENCOUNTER — Encounter: Payer: Self-pay | Admitting: Physical Therapy

## 2021-10-16 ENCOUNTER — Ambulatory Visit: Payer: BC Managed Care – PPO | Attending: General Surgery | Admitting: Physical Therapy

## 2021-10-16 DIAGNOSIS — R293 Abnormal posture: Secondary | ICD-10-CM | POA: Diagnosis present

## 2021-10-16 DIAGNOSIS — M25612 Stiffness of left shoulder, not elsewhere classified: Secondary | ICD-10-CM | POA: Diagnosis present

## 2021-10-16 DIAGNOSIS — R600 Localized edema: Secondary | ICD-10-CM | POA: Insufficient documentation

## 2021-10-16 DIAGNOSIS — M25611 Stiffness of right shoulder, not elsewhere classified: Secondary | ICD-10-CM | POA: Insufficient documentation

## 2021-10-16 DIAGNOSIS — D0511 Intraductal carcinoma in situ of right breast: Secondary | ICD-10-CM | POA: Diagnosis present

## 2021-10-16 DIAGNOSIS — D0512 Intraductal carcinoma in situ of left breast: Secondary | ICD-10-CM | POA: Diagnosis present

## 2021-10-16 NOTE — Therapy (Signed)
[facility] ?[facility] @ [place] ?[place], [place], [address] ?Phone: [phone]   Fax:  [phone] ? ?Physical Therapy Treatment ? ?Patient Details  ?Name: Diana Tate ?MRN: [id number] ?Date of Birth: [birth year] ?Referring Provider (PT): Dr. Marlou Starks ? ? ?Encounter Date: 10/16/2021 ? ? PT End of Session - 10/16/21 1507   ? ? Visit Number 4   ? Number of Visits 10   ? Date for PT Re-Evaluation 11/04/21   ? PT Start Time 1505   ? PT Stop Time 1551   ? PT Time Calculation (min) 46 min   ? Activity Tolerance Patient tolerated treatment well   ? Behavior During Therapy [facility] for tasks assessed/performed   ? ?  ?  ? ?  ? ? ?Past Medical History:  ?Diagnosis Date  ? Cancer [facility])   ? GERD (gastroesophageal reflux disease)   ? Hyperlipidemia   ? Migraine   ? Classic  ? Sleep apnea   ? ? ?Past Surgical History:  ?Procedure Laterality Date  ? AXILLARY SENTINEL NODE BIOPSY Bilateral 09/16/2021  ? Procedure: BILATERAL AXILLARY SENTINEL NODE BIOPSY;  Surgeon: Jovita Kussmaul, MD;  Location: [place];  Service: General;  Laterality: Bilateral;  ? BREAST BIOPSY Right 08/02/2021  ? 2 areas  ? BREAST BIOPSY Left 08/02/2021  ? BREAST EXCISIONAL BIOPSY Right 04/2012  ? BREAST RECONSTRUCTION WITH PLACEMENT OF TISSUE EXPANDER AND FLEX HD (ACELLULAR HYDRATED DERMIS) Bilateral 09/16/2021  ? Procedure: BREAST RECONSTRUCTION WITH PLACEMENT OF TISSUE EXPANDER AND FLEX HD (ACELLULAR HYDRATED DERMIS);  Surgeon: Wallace Going, DO;  Location: [place];  Service: Plastics;  Laterality: Bilateral;  ? BREAST SURGERY    ? benign  ? CHOLECYSTECTOMY  2006  ? ENDOMETRIAL ABLATION  2007  ? ESOPHAGOGASTRODUODENOSCOPY N/A 05/22/2014  ? SLF: 1. Schatzki ring was found 2. MIld non-erosive gastritis  ? TONSILLECTOMY  2000  ? TOTAL MASTECTOMY Bilateral 09/16/2021  ? Procedure: BILATERAL TOTAL MASTECTOMY;  Surgeon: Jovita Kussmaul, MD;  Location: [place];  Service: General;  Laterality: Bilateral;  ? ? ?There were  no vitals filed for this visit. ? ? Subjective Assessment - 10/16/21 1553   ? ? Subjective I have been more tight since last session. I had both breasts aspirated and I have been wearing my compression bra.   ? 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 Patient Stated Goals to gain info from providers   ? Currently in Pain? No/denies   ? Pain Score 0-No pain   ? ?  ?  ? ?  ? ? ? ? ? [facility] PT Assessment - 10/16/21 0001   ? ?  ? AROM  ? Right Shoulder Flexion 130 Degrees   ? Right Shoulder ABduction 160 Degrees   ? Left Shoulder Flexion 158 Degrees   ? Left Shoulder ABduction 169 Degrees   ? ?  ?  ? ?  ? ? ? ? ? ? ? ? ? ? ? ? ? ? ? ? [facility] Adult PT Treatment/Exercise - 10/16/21 0001   ? ?  ? Shoulder Exercises: Pulleys  ? Flexion 2 minutes   pt returend therapist demo  ? ABduction 2 minutes   pt returned therapist demo  ?  ? Shoulder Exercises: Therapy Ball  ? Flexion Both;10 reps   with stretch at end range  ? ABduction Both;10 reps   with stretch  at end range  ?  ? Manual Therapy  ? Manual Therapy Passive ROM;Myofascial release   ? Myofascial Release to thick band extending from breast to axilla like a thick cord to help decrease tightness   ? Passive ROM to bilateral shoulders in to flexionm ER, and abduction to pts tolerance with pt more tight today   ? ?  ?  ? ?  ? ? ? ? ? ? ? ? ? ? ? ? ? ? ? PT Long Term Goals - 10/07/21 1649   ? ?  ? PT LONG TERM GOAL #1  ? Title Pt will return to baseline shoulder ROM and not demonstrate any signs or symptoms of lymphedema.   ? Time 6   ? Period Weeks   ? Status On-going   ?  ? PT LONG TERM GOAL #2  ? Title Pt will report a 50% improvement in feelings of tightness across bilateral chest just inferior to breasts   ? Time 4   ? Period Weeks   ? Status New   ? Target Date 11/04/21   ?  ? PT LONG TERM GOAL #3  ? Title Pt will demonstrate 160 degrees of L shoulder flexion to allow her to  reach overhead.   ? Baseline 150   ? Time 4   ? Period Weeks   ? Status New   ? Target Date 11/04/21   ? ?  ?  ? ?  ? ? ? ? ? ? ? ? Plan - 10/16/21 1554   ? ? Clinical Impression Statement Pt returns to PT. She reports she had her expanders filled since last appointment. She also had fluid aspirated from bilateral breasts. She reports since last session she has had increased difficulty with range of motion. Remeasured pt's ROM bilaterally and she has lost ROM since last session especially R shoulder flexion. Continued with AAROM today followed by PROM to bilateral shoulders. There is a thick band like cording in pt's right axilla that is most likely causing her increased tightness so focused myofascial release to this area.   ? PT Frequency 2x / week   ? PT Duration 4 weeks   ? PT Treatment/Interventions ADLs/Self Care Home Management;Patient/family education;Therapeutic exercise;Manual techniques;Manual lymph drainage;Scar mobilization;Passive range of motion   ? PT Next Visit Plan pt scheduled for 3/6 ABC class, do PROM to bilat sh, pulleys, ball and give supine scap eventually   ? PT Home Exercise Plan post op breast exercises   ? Consulted and Agree with Plan of Care Patient   ? ?  ?  ? ?  ? ? ?Patient will benefit from skilled therapeutic intervention in order to improve the following deficits and impairments:  Postural dysfunction, Decreased knowledge of precautions, Increased fascial restricitons, Decreased range of motion, Decreased scar mobility ? ?Visit Diagnosis: ?Stiffness of left shoulder, not elsewhere classified ? ?Stiffness of right shoulder, not elsewhere classified ? ?Localized edema ? ?Abnormal posture ? ?Ductal carcinoma in situ (DCIS) of right breast ? ?Ductal carcinoma in situ (DCIS) of left breast ? ? ? ? ?Problem List ?Patient Active Problem List  ? Diagnosis Date Noted  ? Acquired absence of breast 09/27/2021  ? Breast cancer ([place]) 09/16/2021  ? Ductal carcinoma in situ (DCIS) of right  breast 08/09/2021  ? Former smoker 12/07/2020  ? History of COVID-19 11/21/2019  ? Obstructive sleep apnea 02/20/2016  ? Insomnia 12/03/2014  ? Alternating constipation and diarrhea 05/16/2014  ? Abdominal pain, epigastric 05/16/2014  ?  Esophageal reflux 12/12/2013  ? Migraine headache with aura 03/30/2013  ? 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 ? ?Allyson Sabal Colby, PT ?10/16/2021, 3:59 PM ? ?[place] ?[facility] @ [facility] ?[place], [place], [address] ?Phone: [phone]   Fax:  [phone] ? ?Name: Diana Tate ?MRN: [id number] ?Date of Birth: April 23, 1971 ? ? ? ?

## 2021-10-21 ENCOUNTER — Ambulatory Visit: Payer: BC Managed Care – PPO | Admitting: Physical Therapy

## 2021-10-21 ENCOUNTER — Encounter: Payer: Self-pay | Admitting: Physical Therapy

## 2021-10-21 ENCOUNTER — Other Ambulatory Visit: Payer: Self-pay

## 2021-10-21 DIAGNOSIS — M25612 Stiffness of left shoulder, not elsewhere classified: Secondary | ICD-10-CM | POA: Diagnosis not present

## 2021-10-21 DIAGNOSIS — R600 Localized edema: Secondary | ICD-10-CM

## 2021-10-21 DIAGNOSIS — D0511 Intraductal carcinoma in situ of right breast: Secondary | ICD-10-CM

## 2021-10-21 DIAGNOSIS — D0512 Intraductal carcinoma in situ of left breast: Secondary | ICD-10-CM

## 2021-10-21 DIAGNOSIS — R293 Abnormal posture: Secondary | ICD-10-CM

## 2021-10-21 DIAGNOSIS — M25611 Stiffness of right shoulder, not elsewhere classified: Secondary | ICD-10-CM

## 2021-10-21 NOTE — Patient Instructions (Signed)
Over Head Pull: Narrow and Wide Grip   Cancer Rehab [phone] ? ? ?On back, knees bent, feet flat, band across thighs, elbows straight but relaxed. Pull hands apart (start). Keeping elbows straight, bring arms up and over head, hands toward floor. Keep pull steady on band. Hold momentarily. Return slowly, keeping pull steady, back to start. Then do same with a wider grip on the band (past shoulder width) ?Repeat _10__ times. Band color __yellow____  ? ?Side Pull: Double Arm ? ? ?On back, knees bent, feet flat. Arms perpendicular to body, shoulder level, elbows straight but relaxed. Pull arms out to sides, elbows straight. Resistance band comes across collarbones, hands toward floor. Hold momentarily. Slowly return to starting position. Repeat _10__ times. Band color _yellow____  ? ?Sword ? ? ?On back, knees bent, feet flat, left hand on left hip, right hand above left. Pull right arm DIAGONALLY (hip to shoulder) across chest. Bring right arm along head toward floor. Hold momentarily. Thumb is pointed down when by hip and rotates backwards when by head. Slowly return to starting position. ?Repeat _10__ times. Do with left arm. Band color _yellow_____  ? ?Shoulder Rotation: Double Arm ? ? ?On back, knees bent, feet flat, elbows tucked at sides, bent 90?, hands palms up. Pull hands apart and down toward floor, keeping elbows near sides. Hold momentarily. Slowly return to starting position. ?Repeat _10__ times. Band color __yellow____  ? ? ?

## 2021-10-21 NOTE — Therapy (Signed)
[facility] ?[facility] ?[place], [place], [address] ?Phone: [phone]   Fax:  [phone] ? ?Physical Therapy Treatment ? ?Patient Details  ?Name: Diana Tate ?MRN: [id number] ?Date of Birth: 09-27-1970 ?Referring Provider (PT): Dr. Marlou Starks ? ? ?Encounter Date: 10/21/2021 ? ? PT End of Session - 10/21/21 1607   ? ? Visit Number 5   ? Number of Visits 10   ? Date for PT Re-Evaluation 11/04/21   ? PT Start Time 3557   ? PT Stop Time 1655   ? PT Time Calculation (min) 49 min   ? Activity Tolerance Patient tolerated treatment well   ? Behavior During Therapy [facility] for tasks assessed/performed   ? ?  ?  ? ?  ? ? ?Past Medical History:  ?Diagnosis Date  ? Cancer [facility])   ? GERD (gastroesophageal reflux disease)   ? Hyperlipidemia   ? Migraine   ? Classic  ? Sleep apnea   ? ? ?Past Surgical History:  ?Procedure Laterality Date  ? AXILLARY SENTINEL NODE BIOPSY Bilateral 09/16/2021  ? Procedure: BILATERAL AXILLARY SENTINEL NODE BIOPSY;  Surgeon: Jovita Kussmaul, MD;  Location: [facility];  Service: General;  Laterality: Bilateral;  ? BREAST BIOPSY Right 08/02/2021  ? 2 areas  ? BREAST BIOPSY Left 08/02/2021  ? BREAST EXCISIONAL BIOPSY Right 04/2012  ? BREAST RECONSTRUCTION WITH PLACEMENT OF TISSUE EXPANDER AND FLEX HD (ACELLULAR HYDRATED DERMIS) Bilateral 09/16/2021  ? Procedure: BREAST RECONSTRUCTION WITH PLACEMENT OF TISSUE EXPANDER AND FLEX HD (ACELLULAR HYDRATED DERMIS);  Surgeon: Wallace Going, DO;  Location: [facility];  Service: Plastics;  Laterality: Bilateral;  ? BREAST SURGERY    ? benign  ? CHOLECYSTECTOMY  2006  ? ENDOMETRIAL ABLATION  2007  ? ESOPHAGOGASTRODUODENOSCOPY N/A 05/22/2014  ? SLF: 1. Schatzki ring was found 2. MIld non-erosive gastritis  ? TONSILLECTOMY  2000  ? TOTAL MASTECTOMY Bilateral 09/16/2021  ? Procedure: BILATERAL TOTAL MASTECTOMY;  Surgeon: Jovita Kussmaul, MD;  Location: [facility];  Service: General;  Laterality: Bilateral;  ? ? ?There were  no vitals filed for this visit. ? ? Subjective Assessment - 10/21/21 1701   ? ? Subjective I did not do my stretches this weekend.   ? 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 Patient Stated Goals to gain info from providers   ? Currently in Pain? No/denies   ? Pain Score 0-No pain   ? ?  ?  ? ?  ? ? ? ? ? [facility] PT Assessment - 10/21/21 0001   ? ?  ? AROM  ? Right Shoulder Flexion 170 Degrees   ? Right Shoulder ABduction 162 Degrees   ? Left Shoulder Flexion 168 Degrees   ? Left Shoulder ABduction 174 Degrees   ? ?  ?  ? ?  ? ? ? ? ? ? ? ? ? ? ? ? ? ? ? ? [facility] Adult PT Treatment/Exercise - 10/21/21 0001   ? ?  ? Shoulder Exercises: Supine  ? Horizontal ABduction Strengthening;Both;10 reps;Theraband   pt return demonstrated  ? Theraband Level (Shoulder Horizontal ABduction) Level 1 (Yellow)   ? External Rotation Strengthening;Both;10 reps;Theraband   pt return demonstrated  ? Theraband Level (Shoulder External Rotation) Level 1 (Yellow)   ? Flexion Strengthening;Both;10 reps;Theraband   narrow and wide grip, pt returned demonstrated  ? Theraband Level (Shoulder Flexion)  Level 1 (Yellow)   ? Diagonals Strengthening;Both;10 reps;Theraband   pt return demonstrated  ? Theraband Level (Shoulder Diagonals) Level 1 (Yellow)   ?  ? Manual Therapy  ? Manual Therapy Manual Lymphatic Drainage (MLD)   ? Myofascial Release briefly to area of tightness from right axilla to breast but this has improved greatly since last session   ? Manual Lymphatic Drainage (MLD) short neck, right inguinal nodes, establishment of axillo inguinal pathway, R lateral trunk moving fluid towards pathway then retracing step with increased fluid noted in lateral trunk   ? ?  ?  ? ?  ? ? ? ? ? ? ? ? ? ? ? ? ? ? ? PT Long Term Goals - 10/07/21 1649   ? ?  ? PT LONG TERM GOAL #1  ? Title Pt will return to baseline shoulder ROM and not demonstrate any signs or  symptoms of lymphedema.   ? Time 6   ? Period Weeks   ? Status On-going   ?  ? PT LONG TERM GOAL #2  ? Title Pt will report a 50% improvement in feelings of tightness across bilateral chest just inferior to breasts   ? Time 4   ? Period Weeks   ? Status New   ? Target Date 11/04/21   ?  ? PT LONG TERM GOAL #3  ? Title Pt will demonstrate 160 degrees of L shoulder flexion to allow her to reach overhead.   ? Baseline 150   ? Time 4   ? Period Weeks   ? Status New   ? Target Date 11/04/21   ? ?  ?  ? ?  ? ? ? ? ? ? ? ? Plan - 10/21/21 1658   ? ? Clinical Impression Statement Pt reports her swelling has returned. Continued to do MLD to R lateral trunk to decrease swelling. Educated pt to call her doctor to see if she needs to come in and get the fluid drained again. Instructed pt today in supine scapular series with pt return demonstrating. Issued these as part of pt's HEP using yellow theraband. The thick band similar to cording extending from axilla to breast has improved greatly since last session.   ? PT Frequency 2x / week   ? PT Duration 4 weeks   ? PT Treatment/Interventions ADLs/Self Care Home Management;Patient/family education;Therapeutic exercise;Manual techniques;Manual lymph drainage;Scar mobilization;Passive range of motion   ? PT Next Visit Plan do PROM to bilat sh, pulleys, ball and assess supine scap, MLD R lateral trunk   ? PT Home Exercise Plan post op breast exercises, supine scap   ? Consulted and Agree with Plan of Care Patient   ? ?  ?  ? ?  ? ? ?Patient will benefit from skilled therapeutic intervention in order to improve the following deficits and impairments:  Postural dysfunction, Decreased knowledge of precautions, Increased fascial restricitons, Decreased range of motion, Decreased scar mobility ? ?Visit Diagnosis: ?Stiffness of left shoulder, not elsewhere classified ? ?Stiffness of right shoulder, not elsewhere classified ? ?Localized edema ? ?Abnormal posture ? ?Ductal carcinoma in situ  (DCIS) of right breast ? ?Ductal carcinoma in situ (DCIS) of left breast ? ? ? ? ?Problem List ?Patient Active Problem List  ? Diagnosis Date Noted  ? Acquired absence of breast 09/27/2021  ? Breast cancer ([place]) 09/16/2021  ? 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 Former smoker 12/07/2020  ? History of COVID-19 11/21/2019  ?  Obstructive sleep apnea 02/20/2016  ? Insomnia 12/03/2014  ? Alternating constipation and diarrhea 05/16/2014  ? Abdominal pain, epigastric 05/16/2014  ? Esophageal reflux 12/12/2013  ? Migraine headache with aura 03/30/2013  ? 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 ? ?Allyson Sabal [place], PT ?10/21/2021, 5:01 PM ? ?[facility] ?[facility] ?[place], [place], [address] ?Phone: [phone]   Fax:  [phone] ? ?Name: Diana Tate ?MRN: [id number] ?Date of Birth: [birth year] ? ? ? ?

## 2021-10-23 ENCOUNTER — Ambulatory Visit: Payer: BC Managed Care – PPO | Admitting: Physical Therapy

## 2021-10-23 ENCOUNTER — Encounter: Payer: Self-pay | Admitting: Physical Therapy

## 2021-10-23 ENCOUNTER — Other Ambulatory Visit: Payer: Self-pay

## 2021-10-23 ENCOUNTER — Telehealth: Payer: Self-pay

## 2021-10-23 DIAGNOSIS — M25611 Stiffness of right shoulder, not elsewhere classified: Secondary | ICD-10-CM

## 2021-10-23 DIAGNOSIS — D0512 Intraductal carcinoma in situ of left breast: Secondary | ICD-10-CM

## 2021-10-23 DIAGNOSIS — M25612 Stiffness of left shoulder, not elsewhere classified: Secondary | ICD-10-CM | POA: Diagnosis not present

## 2021-10-23 DIAGNOSIS — R600 Localized edema: Secondary | ICD-10-CM

## 2021-10-23 DIAGNOSIS — R293 Abnormal posture: Secondary | ICD-10-CM

## 2021-10-23 DIAGNOSIS — D0511 Intraductal carcinoma in situ of right breast: Secondary | ICD-10-CM

## 2021-10-23 NOTE — Telephone Encounter (Signed)
Patient called to say she just left physical therapy and the left side of her chest is red.  She said the physical therapist took a picture of it and they are uploading it to the [facility] in [place].  Patient said this is the side that was swollen last time she saw [place].  She said last week we drew fluid off and she said it's still holding fluid on both sides.  She said Roetta Sessions, PA-C, told her to watch for infection and she is getting concerned about it.  Please call.  ?

## 2021-10-23 NOTE — Therapy (Signed)
[facility] ?[facility] [place] ?[place], [place], [address] ?Phone: [phone]   Fax:  [phone] ? ?Physical Therapy Treatment ? ?Patient Details  ?Name: Diana Tate ?MRN: [id number] ?Date of Birth: 04/21/1971 ?Referring Provider (PT): Dr. Marlou Starks ? ? ?Encounter Date: 10/23/2021 ? ? PT End of Session - 10/23/21 1617   ? ? Visit Number 6   ? Number of Visits 10   ? Date for PT Re-Evaluation 11/04/21   ? PT Start Time 1603   ? PT Stop Time 6503   ? PT Time Calculation (min) 47 min   ? Activity Tolerance Patient tolerated treatment well   ? Behavior During Therapy [facility] for tasks assessed/performed   ? ?  ?  ? ?  ? ? ?Past Medical History:  ?Diagnosis Date  ? Cancer [facility])   ? GERD (gastroesophageal reflux disease)   ? Hyperlipidemia   ? Migraine   ? Classic  ? Sleep apnea   ? ? ?Past Surgical History:  ?Procedure Laterality Date  ? AXILLARY SENTINEL NODE BIOPSY Bilateral 09/16/2021  ? Procedure: BILATERAL AXILLARY SENTINEL NODE BIOPSY;  Surgeon: Jovita Kussmaul, MD;  Location: [facility];  Service: General;  Laterality: Bilateral;  ? BREAST BIOPSY Right 08/02/2021  ? 2 areas  ? BREAST BIOPSY Left 08/02/2021  ? BREAST EXCISIONAL BIOPSY Right 04/2012  ? BREAST RECONSTRUCTION WITH PLACEMENT OF TISSUE EXPANDER AND FLEX HD (ACELLULAR HYDRATED DERMIS) Bilateral 09/16/2021  ? Procedure: BREAST RECONSTRUCTION WITH PLACEMENT OF TISSUE EXPANDER AND FLEX HD (ACELLULAR HYDRATED DERMIS);  Surgeon: Wallace Going, DO;  Location: [facility];  Service: Plastics;  Laterality: Bilateral;  ? BREAST SURGERY    ? benign  ? CHOLECYSTECTOMY  2006  ? ENDOMETRIAL ABLATION  2007  ? ESOPHAGOGASTRODUODENOSCOPY N/A 05/22/2014  ? SLF: 1. Schatzki ring was found 2. MIld non-erosive gastritis  ? TONSILLECTOMY  2000  ? TOTAL MASTECTOMY Bilateral 09/16/2021  ? Procedure: BILATERAL TOTAL MASTECTOMY;  Surgeon: Jovita Kussmaul, MD;  Location: [facility];  Service: General;  Laterality: Bilateral;  ? ? ?There were  no vitals filed for this visit. ? ? Subjective Assessment - 10/23/21 1615   ? ? Subjective I have been doing the band exercises and they are going well.   ? 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 Patient Stated Goals to gain info from providers   ? Currently in Pain? No/denies   ? Pain Score 0-No pain   ? ?  ?  ? ?  ? ? ? ? ? ? ? ? ? ? ? ? ? ? ? ? ? ? ? ? [facility] Adult PT Treatment/Exercise - 10/23/21 0001   ? ?  ? Shoulder Exercises: Supine  ? Horizontal ABduction Strengthening;Both;10 reps;Theraband   therapist gave v/c  ? Theraband Level (Shoulder Horizontal ABduction) Level 1 (Yellow)   ? External Rotation Strengthening;Both;10 reps;Theraband   therapist gave v/c  ? Theraband Level (Shoulder External Rotation) Level 1 (Yellow)   ? Flexion Strengthening;Both;10 reps;Theraband   narrow and wide grip, therapist gave v/c  ? Theraband Level (Shoulder Flexion) Level 1 (Yellow)   ? Diagonals Strengthening;Both;10 reps;Theraband   therapist gave v/c  ? Theraband Level (Shoulder Diagonals) Level 1 (Yellow)   ?  ? Shoulder Exercises: Pulleys  ? Flexion 2 minutes   pt returend therapist demo  ? ABduction 2 minutes   pt returned  therapist demo  ?  ? Shoulder Exercises: Therapy Ball  ? Flexion Both;10 reps   with stretch at end range  ? ABduction Both;10 reps   with stretch at end range  ?  ? Manual Therapy  ? Myofascial Release with pt lying over foam roll to upper abdominal area where pt feels increased tightness   ? Manual Lymphatic Drainage (MLD) not performed today due to redness along L chest   ? Passive ROM to bilateral shoulders with pt lying on 1/2 foam roll with arms outstretched   ? ?  ?  ? ?  ? ? ? ? ? ? ? ? ? ? ? ? ? ? ? PT Long Term Goals - 10/07/21 1649   ? ?  ? PT LONG TERM GOAL #1  ? Title Pt will return to baseline shoulder ROM and not demonstrate any signs or symptoms of lymphedema.   ? Time 6   ? Period Weeks   ?  Status On-going   ?  ? PT LONG TERM GOAL #2  ? Title Pt will report a 50% improvement in feelings of tightness across bilateral chest just inferior to breasts   ? Time 4   ? Period Weeks   ? Status New   ? Target Date 11/04/21   ?  ? PT LONG TERM GOAL #3  ? Title Pt will demonstrate 160 degrees of L shoulder flexion to allow her to reach overhead.   ? Baseline 150   ? Time 4   ? Period Weeks   ? Status New   ? Target Date 11/04/21   ? ?  ?  ? ?  ? ? ? ? ? ? ? ? Plan - 10/23/21 1654   ? ? Clinical Impression Statement Continued with AAROM exercises today. Pt reports her ROM is coming back but she has been having tightness across L upper abdominal area. Had pt lie over half foam roll with arms outstretched and with arms overhead to help stretch this area with pt feeling strong stretch with arms overhead. Educated pt that she can try this at home lying on a rolled up towel or blanket. PROM done to L shoulder while lying over foam roll. Worked on manual therapy to upper abdominal area bilaterally to help reduce tightness in this area. Therapist noticed increased redness across L chest and took a picture and attached it to pt's chart. Inboxed Dr. Marla Roe and made her aware and pt plans to call office this afternoon. Pt demonstrated independence with supine scapular series today and has been compliant with those at home.   ? PT Frequency 2x / week   ? PT Duration 4 weeks   ? PT Treatment/Interventions ADLs/Self Care Home Management;Patient/family education;Therapeutic exercise;Manual techniques;Manual lymph drainage;Scar mobilization;Passive range of motion   ? PT Next Visit Plan do PROM to bilat sh, pulleys, ball, MLD R lateral trunk and axilla, how is redness?   ? PT Home Exercise Plan post op breast exercises, supine scap   ? Consulted and Agree with Plan of Care Patient   ? ?  ?  ? ?  ? ? ?Patient will benefit from skilled therapeutic intervention in order to improve the following deficits and impairments:   Postural dysfunction, Decreased knowledge of precautions, Increased fascial restricitons, Decreased range of motion, Decreased scar mobility ? ?Visit Diagnosis: ?Stiffness of left shoulder, not elsewhere classified ? ?Stiffness of right shoulder, not elsewhere classified ? ?Localized edema ? ?Abnormal posture ? ?Ductal carcinoma in  situ (DCIS) of right breast ? ?Ductal carcinoma in situ (DCIS) of left breast ? ? ? ? ?Problem List ?Patient Active Problem List  ? Diagnosis Date Noted  ? Acquired absence of breast 09/27/2021  ? Breast cancer ([place]) 09/16/2021  ? 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 Former smoker 12/07/2020  ? History of COVID-19 11/21/2019  ? Obstructive sleep apnea 02/20/2016  ? Insomnia 12/03/2014  ? Alternating constipation and diarrhea 05/16/2014  ? Abdominal pain, epigastric 05/16/2014  ? Esophageal reflux 12/12/2013  ? Migraine headache with aura 03/30/2013  ? 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 ? ?Allyson Sabal [place], PT ?10/23/2021, 4:58 PM ? ?[facility] ?[facility] [place] ?[place], [place], [address] ?Phone: [phone]   Fax:  [phone] ? ?Name: Mykael Trott Holstine ?MRN: [id number] ?Date of Birth: 15-Oct-1970 ? ? ? ?

## 2021-10-24 NOTE — Telephone Encounter (Signed)
Left v-m for patient asking her to call us. ?

## 2021-10-24 NOTE — Telephone Encounter (Signed)
Patient has been scheduled to see [facility] tomorrow. ?

## 2021-10-24 NOTE — Progress Notes (Addendum)
51 year old female here for follow-up on bilateral breast reconstruction.  She underwent bilateral mastectomies with immediate breast reconstruction placement of tissue expanders on 09/16/2021.  She is approximately 5 and half weeks postop. ? ?At her last appointment on 10/15/2021, I was able to aspirate 140 cc of straw-colored fluid from the right breast and 10 cc from the left breast.  Today she reports that she feels as if there is some additional fluid that has accumulated. ? ?She presents today for concerns of infection, she was seen in PT 2 days ago and the left breast was red.  Patient reports she is not sure if this is from the compression from her bra.  She is not having any infectious symptoms.  She does not have any increased swelling of the left breast. ? ?Chaperone present on exam ?On exam bilateral breast incisions are intact, subcutaneous fluid collection noted with palpation of the right breast.  Left breast with slight pink color superior to the mastectomy incision along the superior mastectomy flap.  It is not cellulitic.  I do not appreciate any subcutaneous fluid with palpation.  It is minimally tender to palpation.  It is not indurated. ? ?70 cc of straw-colored fluid was aspirated from the right breast using a sterile technique being careful to avoid the expander.  Patient tolerated this well.  Recommend continue with compression.  In regards to the left breast redness, it appears more irritated and pink to me, however given the circumstances and the expander in place will prophylactically treat with doxycycline and have patient follow-up in a few days to reevaluate. ? ?We did NOT place injectable saline in the Expander using a sterile technique: ?Right:  310 / 535 cc ?Left: 310 / 535 cc ? ?F/u next week for re-evaluation and fill ? ? ? ? ?

## 2021-10-25 ENCOUNTER — Other Ambulatory Visit: Payer: Self-pay

## 2021-10-25 ENCOUNTER — Ambulatory Visit (INDEPENDENT_AMBULATORY_CARE_PROVIDER_SITE_OTHER): Payer: BC Managed Care – PPO | Admitting: Surgical

## 2021-10-25 ENCOUNTER — Telehealth: Payer: Self-pay | Admitting: *Deleted

## 2021-10-25 DIAGNOSIS — C50811 Malignant neoplasm of overlapping sites of right female breast: Secondary | ICD-10-CM

## 2021-10-25 DIAGNOSIS — Z9013 Acquired absence of bilateral breasts and nipples: Secondary | ICD-10-CM

## 2021-10-25 MED ORDER — DOXYCYCLINE HYCLATE 100 MG PO TABS: 100.0000 mg | ORAL_TABLET | Freq: Two times a day (BID) | ORAL | 0 refills | Status: AC

## 2021-10-28 ENCOUNTER — Ambulatory Visit: Payer: BC Managed Care – PPO | Admitting: Physical Therapy

## 2021-10-28 ENCOUNTER — Ambulatory Visit (INDEPENDENT_AMBULATORY_CARE_PROVIDER_SITE_OTHER): Payer: BC Managed Care – PPO | Admitting: Surgical

## 2021-10-28 ENCOUNTER — Encounter: Payer: Self-pay | Admitting: Physical Therapy

## 2021-10-28 ENCOUNTER — Other Ambulatory Visit: Payer: Self-pay

## 2021-10-28 DIAGNOSIS — Z9013 Acquired absence of bilateral breasts and nipples: Secondary | ICD-10-CM

## 2021-10-28 DIAGNOSIS — M25612 Stiffness of left shoulder, not elsewhere classified: Secondary | ICD-10-CM

## 2021-10-28 DIAGNOSIS — M25611 Stiffness of right shoulder, not elsewhere classified: Secondary | ICD-10-CM

## 2021-10-28 DIAGNOSIS — C50811 Malignant neoplasm of overlapping sites of right female breast: Secondary | ICD-10-CM

## 2021-10-28 DIAGNOSIS — R293 Abnormal posture: Secondary | ICD-10-CM

## 2021-10-28 DIAGNOSIS — D0511 Intraductal carcinoma in situ of right breast: Secondary | ICD-10-CM

## 2021-10-28 DIAGNOSIS — D0512 Intraductal carcinoma in situ of left breast: Secondary | ICD-10-CM

## 2021-10-28 DIAGNOSIS — R600 Localized edema: Secondary | ICD-10-CM

## 2021-10-28 NOTE — Therapy (Signed)
[place] ?[place] [facility] [place] ?[place], [place], [address] ?Phone: [phone]   Fax:  [phone] ? ?Physical Therapy Treatment ? ?Patient Details  ?Name: Diana Tate ?MRN: [id number] ?Date of Birth: 24-Mar-1971 ?Referring Provider (PT): Dr. Marlou Starks ? ? ?Encounter Date: 10/28/2021 ? ? PT End of Session - 10/28/21 1557   ? ? Visit Number 7   ? Number of Visits 10   ? Date for PT Re-Evaluation 11/04/21   ? PT Start Time 9924   ? PT Stop Time 2683   ? PT Time Calculation (min) 52 min   ? Activity Tolerance Patient tolerated treatment well   ? Behavior During Therapy [facility] for tasks assessed/performed   ? ?  ?  ? ?  ? ? ?Past Medical History:  ?Diagnosis Date  ? Cancer [facility])   ? GERD (gastroesophageal reflux disease)   ? Hyperlipidemia   ? Migraine   ? Classic  ? Sleep apnea   ? ? ?Past Surgical History:  ?Procedure Laterality Date  ? AXILLARY SENTINEL NODE BIOPSY Bilateral 09/16/2021  ? Procedure: BILATERAL AXILLARY SENTINEL NODE BIOPSY;  Surgeon: Jovita Kussmaul, MD;  Location: [place];  Service: General;  Laterality: Bilateral;  ? BREAST BIOPSY Right 08/02/2021  ? 2 areas  ? BREAST BIOPSY Left 08/02/2021  ? BREAST EXCISIONAL BIOPSY Right 04/2012  ? BREAST RECONSTRUCTION WITH PLACEMENT OF TISSUE EXPANDER AND FLEX HD (ACELLULAR HYDRATED DERMIS) Bilateral 09/16/2021  ? Procedure: BREAST RECONSTRUCTION WITH PLACEMENT OF TISSUE EXPANDER AND FLEX HD (ACELLULAR HYDRATED DERMIS);  Surgeon: Wallace Going, DO;  Location: [place];  Service: Plastics;  Laterality: Bilateral;  ? BREAST SURGERY    ? benign  ? CHOLECYSTECTOMY  2006  ? ENDOMETRIAL ABLATION  2007  ? ESOPHAGOGASTRODUODENOSCOPY N/A 05/22/2014  ? SLF: 1. Schatzki ring was found 2. MIld non-erosive gastritis  ? TONSILLECTOMY  2000  ? TOTAL MASTECTOMY Bilateral 09/16/2021  ? Procedure: BILATERAL TOTAL MASTECTOMY;  Surgeon: Jovita Kussmaul, MD;  Location: [place];  Service: General;  Laterality: Bilateral;  ? ? ?There were  no vitals filed for this visit. ? ? Subjective Assessment - 10/28/21 1557   ? ? Subjective I don't know how well I can stretch. I just got a fill this morning.   ? 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 Patient Stated Goals to gain info from providers   ? Currently in Pain? No/denies   ? Pain Score 0-No pain   ? ?  ?  ? ?  ? ? ? ? ? ? ? ? ? ? ? ? ? ? ? ? ? ? ? ? [place] Adult PT Treatment/Exercise - 10/28/21 0001   ? ?  ? Shoulder Exercises: Pulleys  ? Flexion 2 minutes   pt returend therapist demo  ? ABduction 2 minutes   pt returned therapist demo  ?  ? Shoulder Exercises: Therapy Ball  ? Flexion Both;10 reps   with stretch at end range  ? ABduction Both;10 reps   with stretch at end range  ?  ? Manual Therapy  ? Manual Therapy Edema management   ? Edema Management created a chip pack for pt to wear in her bra against R lateral trunk to decrease fluid build up   ? Manual Lymphatic Drainage (MLD) short neck, right inguinal nodes, establishment of axillo inguinal pathway, R lateral trunk moving  fluid towards pathway then retracing step with increased fluid noted in lateral trunk   ? Passive ROM to bilateral shoulders in direction of flexion, abduction and ER   ? ?  ?  ? ?  ? ? ? ? ? ? ? ? ? ? ? ? ? ? ? PT Long Term Goals - 10/07/21 1649   ? ?  ? PT LONG TERM GOAL #1  ? Title Pt will return to baseline shoulder ROM and not demonstrate any signs or symptoms of lymphedema.   ? Time 6   ? Period Weeks   ? Status On-going   ?  ? PT LONG TERM GOAL #2  ? Title Pt will report a 50% improvement in feelings of tightness across bilateral chest just inferior to breasts   ? Time 4   ? Period Weeks   ? Status New   ? Target Date 11/04/21   ?  ? PT LONG TERM GOAL #3  ? Title Pt will demonstrate 160 degrees of L shoulder flexion to allow her to reach overhead.   ? Baseline 150   ? Time 4   ? Period Weeks   ? Status New   ? Target Date  11/04/21   ? ?  ?  ? ?  ? ? ? ? ? ? ? ? Plan - 10/28/21 1655   ? ? Clinical Impression Statement Pt had a fill this morning and had increased bilateral shoulder tightness but reports it is improving. She was able to complete pulleys and ball up the wall today though she did feel more of a stretch at end range. She had to have fluid drained from her R lateral trunk again. Created a foam chip pack for her to wear in her bra to help decrease fluid buildup by increasing compression. She still has redness across L chest despite being on antibiotics. She reports the doctor feels it is a skin irritation more than infection. Continued with PROM to bilateral shoulders today and MLD to R lateral trunk.   ? PT Frequency 2x / week   ? PT Duration 4 weeks   ? PT Treatment/Interventions ADLs/Self Care Home Management;Patient/family education;Therapeutic exercise;Manual techniques;Manual lymph drainage;Scar mobilization;Passive range of motion   ? PT Next Visit Plan do PROM to bilat sh, pulleys, ball, MLD R lateral trunk and axilla, how is redness?   ? PT Home Exercise Plan post op breast exercises, supine scap   ? Consulted and Agree with Plan of Care Patient   ? ?  ?  ? ?  ? ? ?Patient will benefit from skilled therapeutic intervention in order to improve the following deficits and impairments:  Postural dysfunction, Decreased knowledge of precautions, Increased fascial restricitons, Decreased range of motion, Decreased scar mobility ? ?Visit Diagnosis: ?Stiffness of left shoulder, not elsewhere classified ? ?Stiffness of right shoulder, not elsewhere classified ? ?Localized edema ? ?Abnormal posture ? ?Ductal carcinoma in situ (DCIS) of right breast ? ?Ductal carcinoma in situ (DCIS) of left breast ? ? ? ? ?Problem List ?Patient Active Problem List  ? Diagnosis Date Noted  ? Acquired absence of breast 09/27/2021  ? Breast cancer ([place]) 09/16/2021  ? 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 Former smoker  12/07/2020  ? History of COVID-19 11/21/2019  ? Obstructive sleep apnea 02/20/2016  ? Insomnia 12/03/2014  ? Alternating constipation and diarrhea 05/16/2014  ? Abdominal pain, epigastric 05/16/2014  ? Esophageal reflux 12/12/2013  ? Migraine headache with  aura 03/30/2013  ? 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 ? ?[place], PT ?10/28/2021, 4:58 PM ? ?[place] ?[place] [facility] [place] ?[place], [place], [address] ?Phone: [phone]   Fax:  [phone] ? ?Name: Diana Tate ?MRN: [id number] ?Date of Birth: 06/05/1971 ? ? ? ?

## 2021-10-28 NOTE — Progress Notes (Signed)
51 year old female here for follow-up on her bilateral breast reconstruction on 09/16/2021.  She is 6 weeks postop. ? ?She was evaluated 3 days ago, 70 cc of straw-colored fluid was aspirated from her right breast using a sterile technique.  She reports today she has started wearing a more compressive bra, feels as if this is helping. ? ?She did also have some skin color changes on the left breast at her follow-up 3 days ago, she was started on doxycycline given that she has an expander in place.  She reports the pink area has improved, but she feels it is more of an irritation than infected.  I agree with her. ? ?Chaperone present on exam ?On exam bilateral breast incisions are intact, subcutaneous fluid collection palpated in the right lateral breast.  There is no overlying erythema of the right breast.  Left breast "pinkness" has improved, there is no cellulitic changes.  No subcutaneous fluid collection noted in the left breast.  Bilateral mastectomy flaps are viable ? ?We placed injectable saline in the Expander using a sterile technique: ?Right: 85 cc for a total of 395 / 535 cc ?Left: 85 cc for a total of 395 / 535 cc ? ?Patient has appointment scheduled for 11/08/2021.  Recommend calling with questions or concerns.  Recommend continuing doxycycline for 2 more days to complete course ? ? ?

## 2021-10-31 ENCOUNTER — Ambulatory Visit: Payer: BC Managed Care – PPO | Admitting: Physical Therapy

## 2021-10-31 ENCOUNTER — Telehealth: Payer: Self-pay | Admitting: *Deleted

## 2021-10-31 ENCOUNTER — Other Ambulatory Visit: Payer: Self-pay

## 2021-10-31 ENCOUNTER — Encounter: Payer: Self-pay | Admitting: Physical Therapy

## 2021-10-31 DIAGNOSIS — M25611 Stiffness of right shoulder, not elsewhere classified: Secondary | ICD-10-CM

## 2021-10-31 DIAGNOSIS — R600 Localized edema: Secondary | ICD-10-CM

## 2021-10-31 DIAGNOSIS — R293 Abnormal posture: Secondary | ICD-10-CM

## 2021-10-31 DIAGNOSIS — D0512 Intraductal carcinoma in situ of left breast: Secondary | ICD-10-CM

## 2021-10-31 DIAGNOSIS — M25612 Stiffness of left shoulder, not elsewhere classified: Secondary | ICD-10-CM | POA: Diagnosis not present

## 2021-10-31 DIAGNOSIS — D0511 Intraductal carcinoma in situ of right breast: Secondary | ICD-10-CM

## 2021-10-31 NOTE — Therapy (Signed)
[place] ?[place] [facility] [place] ?[place], [place], [address] ?Phone: [phone]   Fax:  [phone] ? ?Physical Therapy Treatment ? ?Patient Details  ?Name: Diana Tate ?MRN: [id number] ?Date of Birth: 02/06/1971 ?Referring Provider (PT): Dr. Marlou Starks ? ? ?Encounter Date: 10/31/2021 ? ? PT End of Session - 10/31/21 1503   ? ? Visit Number 8   ? Number of Visits 10   ? Date for PT Re-Evaluation 11/04/21   ? PT Start Time 1500   ? PT Stop Time 5170   ? PT Time Calculation (min) 48 min   ? Activity Tolerance Patient tolerated treatment well   ? Behavior During Therapy [facility] for tasks assessed/performed   ? ?  ?  ? ?  ? ? ?Past Medical History:  ?Diagnosis Date  ? Cancer [facility])   ? GERD (gastroesophageal reflux disease)   ? Hyperlipidemia   ? Migraine   ? Classic  ? Sleep apnea   ? ? ?Past Surgical History:  ?Procedure Laterality Date  ? AXILLARY SENTINEL NODE BIOPSY Bilateral 09/16/2021  ? Procedure: BILATERAL AXILLARY SENTINEL NODE BIOPSY;  Surgeon: Jovita Kussmaul, MD;  Location: [place];  Service: General;  Laterality: Bilateral;  ? BREAST BIOPSY Right 08/02/2021  ? 2 areas  ? BREAST BIOPSY Left 08/02/2021  ? BREAST EXCISIONAL BIOPSY Right 04/2012  ? BREAST RECONSTRUCTION WITH PLACEMENT OF TISSUE EXPANDER AND FLEX HD (ACELLULAR HYDRATED DERMIS) Bilateral 09/16/2021  ? Procedure: BREAST RECONSTRUCTION WITH PLACEMENT OF TISSUE EXPANDER AND FLEX HD (ACELLULAR HYDRATED DERMIS);  Surgeon: Wallace Going, DO;  Location: [place];  Service: Plastics;  Laterality: Bilateral;  ? BREAST SURGERY    ? benign  ? CHOLECYSTECTOMY  2006  ? ENDOMETRIAL ABLATION  2007  ? ESOPHAGOGASTRODUODENOSCOPY N/A 05/22/2014  ? SLF: 1. Schatzki ring was found 2. MIld non-erosive gastritis  ? TONSILLECTOMY  2000  ? TOTAL MASTECTOMY Bilateral 09/16/2021  ? Procedure: BILATERAL TOTAL MASTECTOMY;  Surgeon: Jovita Kussmaul, MD;  Location: [facility];  Service: General;  Laterality: Bilateral;  ? ? ?There were  no vitals filed for this visit. ? ? Subjective Assessment - 10/31/21 1554   ? ? Subjective I feel tight today   ? 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 Patient Stated Goals to gain info from providers   ? Currently in Pain? No/denies   ? Pain Score 0-No pain   ? ?  ?  ? ?  ? ? ? ? ? ? ? ? ? ? ? ? ? ? ? ? ? ? ? ? [place] Adult PT Treatment/Exercise - 10/31/21 0001   ? ?  ? Shoulder Exercises: Pulleys  ? Flexion 2 minutes   pt returend therapist demo  ? ABduction 2 minutes   pt returned therapist demo  ?  ? Manual Therapy  ? Manual Therapy Soft tissue mobilization;Passive ROM   ? Soft tissue mobilization to upper abdomen bilaterally where pt feels tightness and pulling with overhead arm movements   ? Passive ROM to bilateral shoulders in direction of flexion, abduction and ER   ? ?  ?  ? ?  ? ? ? ? ? ? ? ? ? ? ? ? ? ? ? PT Long Term Goals - 10/07/21 1649   ? ?  ? PT LONG TERM GOAL #1  ? Title Pt will return to baseline shoulder  ROM and not demonstrate any signs or symptoms of lymphedema.   ? Time 6   ? Period Weeks   ? Status On-going   ?  ? PT LONG TERM GOAL #2  ? Title Pt will report a 50% improvement in feelings of tightness across bilateral chest just inferior to breasts   ? Time 4   ? Period Weeks   ? Status New   ? Target Date 11/04/21   ?  ? PT LONG TERM GOAL #3  ? Title Pt will demonstrate 160 degrees of L shoulder flexion to allow her to reach overhead.   ? Baseline 150   ? Time 4   ? Period Weeks   ? Status New   ? Target Date 11/04/21   ? ?  ?  ? ?  ? ? ? ? ? ? ? ? Plan - 10/31/21 1552   ? ? Clinical Impression Statement Pt reports she is having increased tightness today. She improved with AAROM exercises. She still has the pulling and tightness in upper abdomen at times with overhead motions. Performed soft tissue mobilization to this area today. Continued with PROM to bilateral shoulders. No MLD performed today  because pt did not seem to have swelling since it was drawn off on Monday.   ? PT Frequency 2x / week   ? PT Duration 4 weeks   ? PT Treatment/Interventions ADLs/Self Care Home Management;Patient/family education;Therapeutic exercise;Manual techniques;Manual lymph drainage;Scar mobilization;Passive range of motion   ? PT Next Visit Plan do PROM to bilat sh, pulleys, ball, MLD R lateral trunk and axilla, how is redness?   ? Consulted and Agree with Plan of Care Patient   ? ?  ?  ? ?  ? ? ?Patient will benefit from skilled therapeutic intervention in order to improve the following deficits and impairments:  Postural dysfunction, Decreased knowledge of precautions, Increased fascial restricitons, Decreased range of motion, Decreased scar mobility ? ?Visit Diagnosis: ?Stiffness of left shoulder, not elsewhere classified ? ?Stiffness of right shoulder, not elsewhere classified ? ?Localized edema ? ?Abnormal posture ? ?Ductal carcinoma in situ (DCIS) of right breast ? ?Ductal carcinoma in situ (DCIS) of left breast ? ? ? ? ?Problem List ?Patient Active Problem List  ? Diagnosis Date Noted  ? Acquired absence of breast 09/27/2021  ? Breast cancer ([place]) 09/16/2021  ? 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 Former smoker 12/07/2020  ? History of COVID-19 11/21/2019  ? Obstructive sleep apnea 02/20/2016  ? Insomnia 12/03/2014  ? Alternating constipation and diarrhea 05/16/2014  ? Abdominal pain, epigastric 05/16/2014  ? Esophageal reflux 12/12/2013  ? Migraine headache with aura 03/30/2013  ? 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 ? ?Allyson Sabal [place], PT ?10/31/2021, 3:55 PM ? ?[place] ?[place] @ [place] ?[place], [place], [address] ?Phone: [phone]   Fax:  [phone] ? ?Name: Diana Tate ?MRN: [id number] ?Date of Birth: [birth year] ? ? ? ?

## 2021-11-01 ENCOUNTER — Telehealth: Payer: Self-pay | Admitting: Physician Assistant

## 2021-11-01 NOTE — Telephone Encounter (Signed)
.  Called patient to schedule appointment per 3/16 inbasket, patient is aware of date and time.   ?

## 2021-11-04 ENCOUNTER — Encounter: Payer: Self-pay | Admitting: Physical Therapy

## 2021-11-04 ENCOUNTER — Other Ambulatory Visit: Payer: Self-pay

## 2021-11-04 ENCOUNTER — Ambulatory Visit: Payer: BC Managed Care – PPO | Admitting: Physical Therapy

## 2021-11-04 DIAGNOSIS — D0511 Intraductal carcinoma in situ of right breast: Secondary | ICD-10-CM

## 2021-11-04 DIAGNOSIS — R600 Localized edema: Secondary | ICD-10-CM

## 2021-11-04 DIAGNOSIS — M25611 Stiffness of right shoulder, not elsewhere classified: Secondary | ICD-10-CM

## 2021-11-04 DIAGNOSIS — D0512 Intraductal carcinoma in situ of left breast: Secondary | ICD-10-CM

## 2021-11-04 DIAGNOSIS — M25612 Stiffness of left shoulder, not elsewhere classified: Secondary | ICD-10-CM | POA: Diagnosis not present

## 2021-11-04 DIAGNOSIS — R293 Abnormal posture: Secondary | ICD-10-CM

## 2021-11-04 NOTE — Therapy (Signed)
[place] ?[place] [facility] [place] ?[place], [place], [address] ?Phone: [phone]   Fax:  [phone] ? ?Physical Therapy Treatment ? ?Patient Details  ?Name: Diana Tate ?MRN: [id number] ?Date of Birth: 06-23-1971 ?Referring Provider (PT): Dr. Marlou Starks ? ? ?Encounter Date: 11/04/2021 ? ? PT End of Session - 11/04/21 1649   ? ? Visit Number 9   ? Number of Visits 10   ? Date for PT Re-Evaluation 11/04/21   ? PT Start Time 1603   ? PT Stop Time 7510   ? PT Time Calculation (min) 45 min   ? Activity Tolerance Patient tolerated treatment well   ? Behavior During Therapy [facility] for tasks assessed/performed   ? ?  ?  ? ?  ? ? ?Past Medical History:  ?Diagnosis Date  ? Cancer [facility])   ? GERD (gastroesophageal reflux disease)   ? Hyperlipidemia   ? Migraine   ? Classic  ? Sleep apnea   ? ? ?Past Surgical History:  ?Procedure Laterality Date  ? AXILLARY SENTINEL NODE BIOPSY Bilateral 09/16/2021  ? Procedure: BILATERAL AXILLARY SENTINEL NODE BIOPSY;  Surgeon: Jovita Kussmaul, MD;  Location: [place];  Service: General;  Laterality: Bilateral;  ? BREAST BIOPSY Right 08/02/2021  ? 2 areas  ? BREAST BIOPSY Left 08/02/2021  ? BREAST EXCISIONAL BIOPSY Right 04/2012  ? BREAST RECONSTRUCTION WITH PLACEMENT OF TISSUE EXPANDER AND FLEX HD (ACELLULAR HYDRATED DERMIS) Bilateral 09/16/2021  ? Procedure: BREAST RECONSTRUCTION WITH PLACEMENT OF TISSUE EXPANDER AND FLEX HD (ACELLULAR HYDRATED DERMIS);  Surgeon: Wallace Going, DO;  Location: [place];  Service: Plastics;  Laterality: Bilateral;  ? BREAST SURGERY    ? benign  ? CHOLECYSTECTOMY  2006  ? ENDOMETRIAL ABLATION  2007  ? ESOPHAGOGASTRODUODENOSCOPY N/A 05/22/2014  ? SLF: 1. Schatzki ring was found 2. MIld non-erosive gastritis  ? TONSILLECTOMY  2000  ? TOTAL MASTECTOMY Bilateral 09/16/2021  ? Procedure: BILATERAL TOTAL MASTECTOMY;  Surgeon: Jovita Kussmaul, MD;  Location: [place];  Service: General;  Laterality: Bilateral;  ? ? ?There were  no vitals filed for this visit. ? ? Subjective Assessment - 11/04/21 1655   ? ? Subjective I have tightness in my upper abdomen again today.   ? 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 Patient Stated Goals to gain info from providers   ? Currently in Pain? No/denies   not rated today  ? Pain Score 0-No pain   ? ?  ?  ? ?  ? ? ? ? ? ? ? ? ? ? ? ? ? ? ? ? ? ? ? ? [place] Adult PT Treatment/Exercise - 11/04/21 0001   ? ?  ? Shoulder Exercises: Supine  ? Other Supine Exercises bow and arrow yoga stretch in sidelying beginning with elbow bent then straightening elbow and looking towards elbow to stretch upper abdoment, then in hook lying with truncal rotation and arm overhead for abdominal stretch   ?  ? Shoulder Exercises: Pulleys  ? Flexion 2 minutes   pt returend therapist demo  ? ABduction 2 minutes   pt returned therapist demo  ?  ? Shoulder Exercises: Therapy Ball  ? Flexion Both;10 reps   with stretch at end range  ? ABduction Both;10 reps   with stretch at end range  ?  ? Manual Therapy  ? Soft tissue mobilization to  upper abdomen bilaterally where pt feels tightness and pulling with overhead arm movements   ? Passive ROM to bilateral shoulders in direction of flexion and abduction   ? ?  ?  ? ?  ? ? ? ? ? ? ? ? ? ? PT Education - 11/04/21 1650   ? ? Education Details bow and arrow yoga stretch in sidelying to decrease tightness in upper abdomen and trunk rotation in hook lying with arm overhead   ? Person(s) Educated Patient   ? Methods Verbal cues   ? Comprehension Verbalized understanding;Returned demonstration   ? ?  ?  ? ?  ? ? ? ? ? ? PT Long Term Goals - 10/07/21 1649   ? ?  ? PT LONG TERM GOAL #1  ? Title Pt will return to baseline shoulder ROM and not demonstrate any signs or symptoms of lymphedema.   ? Time 6   ? Period Weeks   ? Status On-going   ?  ? PT LONG TERM GOAL #2  ? Title Pt will report a 50%  improvement in feelings of tightness across bilateral chest just inferior to breasts   ? Time 4   ? Period Weeks   ? Status New   ? Target Date 11/04/21   ?  ? PT LONG TERM GOAL #3  ? Title Pt will demonstrate 160 degrees of L shoulder flexion to allow her to reach overhead.   ? Baseline 150   ? Time 4   ? Period Weeks   ? Status New   ? Target Date 11/04/21   ? ?  ?  ? ?  ? ? ? ? ? ? ? ? Plan - 11/04/21 1653   ? ? Clinical Impression Statement Pt still having increased tightness across upper abdomen bilaterally. Instructed pt in bow and arrow yoga stretch as well as truncal rotation in hook lying to help stretch upper abdomen. Pt felt a strong stretch with this. Encouraged pt to practice this at home. Also continued with soft tissue mobilization to bilateral upper abdomen to help decrease tightness and discomfort in this area. Swelling still seems to be improved since fluid was last drained from R side.   ? PT Frequency 2x / week   ? PT Duration 4 weeks   ? PT Treatment/Interventions ADLs/Self Care Home Management;Patient/family education;Therapeutic exercise;Manual techniques;Manual lymph drainage;Scar mobilization;Passive range of motion   ? PT Next Visit Plan update POC, do PROM to bilat sh, pulleys, ball, MLD R lateral trunk and axilla, how is redness?   ? Consulted and Agree with Plan of Care Patient   ? ?  ?  ? ?  ? ? ?Patient will benefit from skilled therapeutic intervention in order to improve the following deficits and impairments:  Postural dysfunction, Decreased knowledge of precautions, Increased fascial restricitons, Decreased range of motion, Decreased scar mobility ? ?Visit Diagnosis: ?Stiffness of left shoulder, not elsewhere classified ? ?Stiffness of right shoulder, not elsewhere classified ? ?Localized edema ? ?Abnormal posture ? ?Ductal carcinoma in situ (DCIS) of right breast ? ?Ductal carcinoma in situ (DCIS) of left breast ? ? ? ? ?Problem List ?Patient Active Problem List  ? Diagnosis Date  Noted  ? Acquired absence of breast 09/27/2021  ? Breast cancer ([place]) 09/16/2021  ? 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 Former smoker 12/07/2020  ? History of COVID-19 11/21/2019  ? Obstructive sleep apnea 02/20/2016  ? Insomnia 12/03/2014  ? Alternating constipation  and diarrhea 05/16/2014  ? Abdominal pain, epigastric 05/16/2014  ? Esophageal reflux 12/12/2013  ? Migraine headache with aura 03/30/2013  ? 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 ? ?Allyson Sabal [place], PT ?11/04/2021, 4:56 PM ? ?[place] ?[place] @ [place] ?[place], [place], [address] ?Phone: [phone]   Fax:  [phone] ? ?Name: Claudene Gatliff Skeet ?MRN: [id number] ?Date of Birth: Mar 03, 1971 ? ? ? ?

## 2021-11-06 ENCOUNTER — Other Ambulatory Visit: Payer: Self-pay

## 2021-11-06 ENCOUNTER — Ambulatory Visit: Payer: BC Managed Care – PPO | Admitting: Physical Therapy

## 2021-11-06 ENCOUNTER — Encounter: Payer: Self-pay | Admitting: Physical Therapy

## 2021-11-06 DIAGNOSIS — D0511 Intraductal carcinoma in situ of right breast: Secondary | ICD-10-CM

## 2021-11-06 DIAGNOSIS — M25611 Stiffness of right shoulder, not elsewhere classified: Secondary | ICD-10-CM

## 2021-11-06 DIAGNOSIS — M25612 Stiffness of left shoulder, not elsewhere classified: Secondary | ICD-10-CM

## 2021-11-06 DIAGNOSIS — R293 Abnormal posture: Secondary | ICD-10-CM

## 2021-11-06 DIAGNOSIS — R600 Localized edema: Secondary | ICD-10-CM

## 2021-11-06 DIAGNOSIS — D0512 Intraductal carcinoma in situ of left breast: Secondary | ICD-10-CM

## 2021-11-06 NOTE — Therapy (Signed)
[place] ?[facility] ?[place], [place], [address] ?Phone: [phone]   Fax:  [phone] ? ?Physical Therapy Treatment ? ?Patient Details  ?Name: Diana Tate ?MRN: [id number] ?Date of Birth: [birth year] ?Referring Provider (PT): Dr. Marlou Starks ? ? ?Encounter Date: 11/06/2021 ? ? PT End of Session - 11/06/21 1419   ? ? Visit Number 10   ? Number of Visits 18   ? Date for PT Re-Evaluation 12/04/21   ? PT Start Time 1303   ? PT Stop Time 1350   ? PT Time Calculation (min) 47 min   ? Activity Tolerance Patient tolerated treatment well   ? Behavior During Therapy [facility] for tasks assessed/performed   ? ?  ?  ? ?  ? ? ?Past Medical History:  ?Diagnosis Date  ? Cancer [facility])   ? GERD (gastroesophageal reflux disease)   ? Hyperlipidemia   ? Migraine   ? Classic  ? Sleep apnea   ? ? ?Past Surgical History:  ?Procedure Laterality Date  ? AXILLARY SENTINEL NODE BIOPSY Bilateral 09/16/2021  ? Procedure: BILATERAL AXILLARY SENTINEL NODE BIOPSY;  Surgeon: Jovita Kussmaul, MD;  Location: [place];  Service: General;  Laterality: Bilateral;  ? BREAST BIOPSY Right 08/02/2021  ? 2 areas  ? BREAST BIOPSY Left 08/02/2021  ? BREAST EXCISIONAL BIOPSY Right 04/2012  ? BREAST RECONSTRUCTION WITH PLACEMENT OF TISSUE EXPANDER AND FLEX HD (ACELLULAR HYDRATED DERMIS) Bilateral 09/16/2021  ? Procedure: BREAST RECONSTRUCTION WITH PLACEMENT OF TISSUE EXPANDER AND FLEX HD (ACELLULAR HYDRATED DERMIS);  Surgeon: Wallace Going, DO;  Location: [place];  Service: Plastics;  Laterality: Bilateral;  ? BREAST SURGERY    ? benign  ? CHOLECYSTECTOMY  2006  ? ENDOMETRIAL ABLATION  2007  ? ESOPHAGOGASTRODUODENOSCOPY N/A 05/22/2014  ? SLF: 1. Schatzki ring was found 2. MIld non-erosive gastritis  ? TONSILLECTOMY  2000  ? TOTAL MASTECTOMY Bilateral 09/16/2021  ? Procedure: BILATERAL TOTAL MASTECTOMY;  Surgeon: Jovita Kussmaul, MD;  Location: [place];  Service: General;  Laterality: Bilateral;  ? ? ?There were  no vitals filed for this visit. ? ? Subjective Assessment - 11/06/21 1421   ? ? Subjective I did the stretches to help with upper abdominal pain but it still happened yesterday.   ? 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 Patient Stated Goals to gain info from providers   ? Currently in Pain? No/denies   ? Pain Score 0-No pain   ? ?  ?  ? ?  ? ? ? ? ? ? ? ? ? ? ? ? ? ? ? ? ? ? ? ? [facility] Adult PT Treatment/Exercise - 11/06/21 0001   ? ?  ? Shoulder Exercises: Pulleys  ? Flexion 2 minutes   pt returend therapist demo  ? ABduction 2 minutes   pt returned therapist demo  ?  ? Shoulder Exercises: Therapy Ball  ? Flexion Both;10 reps   with stretch at end range  ? ABduction Both;10 reps   with stretch at end range  ?  ? Manual Therapy  ? Soft tissue mobilization to upper abdomen bilaterally where pt feels tightness and pulling with overhead arm movements   ? Passive ROM to bilateral shoulders in direction of flexion and abduction   ? ?  ?  ? ?  ? ? ? ? ? ? ? ? ? ? ? ? ? ? ?  PT Long Term Goals - 11/06/21 1421   ? ?  ? PT LONG TERM GOAL #1  ? Title Pt will return to baseline shoulder ROM and not demonstrate any signs or symptoms of lymphedema.   ? Time 6   ? Period Weeks   ? Status Achieved   ?  ? PT LONG TERM GOAL #2  ? Title Pt will report a 50% improvement in feelings of tightness across bilateral chest just inferior to breasts   ? Baseline 11/06/21- pt reports this changes after each fill of her expanders, 60-70% improvement   ? Time 4   ? Period Weeks   ? Status Achieved   ?  ? PT LONG TERM GOAL #3  ? Title Pt will demonstrate 160 degrees of L shoulder flexion to allow her to reach overhead.   ? Baseline 150; 11/06/21- 166   ? Time 4   ? Period Weeks   ? Status Achieved   ?  ? PT LONG TERM GOAL #4  ? Title Pt will have decreased spasms in upper abdomen and decreased tightness in bilateral axilla after expander fills to allow  improved comfort.   ? Time 4   ? Period Weeks   ? Status New   ? Target Date 12/04/21   ?  ? PT LONG TERM GOAL #5  ? Title Pt will be independent in a home exercise program for continued stretching and strengthening.   ? Time 4   ? Period Weeks   ? Status New   ? Target Date 12/04/21   ? ?  ?  ? ?  ? ? ? ? ? ? ? ? Plan - 11/06/21 1450   ? ? Clinical Impression Statement Assessed pt's progress towards goals in therapy. Pt has met all current goals for therapy. She does still have tightness across the upper abdominal area with some spasms and also in bilateral axilla especially after her expanders are filled. Continued with AAROM exercises today to help decrease tightness and manual therapy across bilateral upper abdomen to decrease spasms. Pt would benefit from additional skilled PT services to decrease tightness and spasms and to instruct pt in Strength ABC program for long term stretching and strengthing with education on how to progress exercises while decreasing lymphedema risk.   ? PT Frequency 2x / week   ? PT Duration 4 weeks   ? PT Treatment/Interventions ADLs/Self Care Home Management;Patient/family education;Therapeutic exercise;Manual techniques;Manual lymph drainage;Scar mobilization;Passive range of motion   ? PT Next Visit Plan , do PROM to bilat sh, pulleys, ball, MLD R lateral trunk and axilla, manual therapy to upper abdomen to decrease spasms   ? PT Home Exercise Plan post op breast exercises, supine scap   ? Consulted and Agree with Plan of Care Patient   ? ?  ?  ? ?  ? ? ?Patient will benefit from skilled therapeutic intervention in order to improve the following deficits and impairments:  Postural dysfunction, Decreased knowledge of precautions, Increased fascial restricitons, Decreased range of motion, Decreased scar mobility ? ?Visit Diagnosis: ?Stiffness of left shoulder, not elsewhere classified ? ?Stiffness of right shoulder, not elsewhere classified ? ?Localized edema ? ?Abnormal  posture ? ?Ductal carcinoma in situ (DCIS) of right breast ? ?Ductal carcinoma in situ (DCIS) of left breast ? ? ? ? ?Problem List ?Patient Active Problem List  ? Diagnosis Date Noted  ? Acquired absence of breast 09/27/2021  ? Breast cancer ([place]) 09/16/2021  ? Ductal carcinoma in  situ (DCIS) of right breast 08/09/2021  ? Former smoker 12/07/2020  ? History of COVID-19 11/21/2019  ? Obstructive sleep apnea 02/20/2016  ? Insomnia 12/03/2014  ? Alternating constipation and diarrhea 05/16/2014  ? Abdominal pain, epigastric 05/16/2014  ? Esophageal reflux 12/12/2013  ? Migraine headache with aura 03/30/2013  ? 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 ? ?Allyson Sabal [place], PT ?11/06/2021, 2:59 PM ? ?[place] ?[place] [facility] [place] ?[place], [place], [address] ?Phone: [phone]   Fax:  [phone] ? ?Name: Diana Tate ?MRN: [id number] ?Date of Birth: [birth year] ? ? ? ?

## 2021-11-08 ENCOUNTER — Encounter: Payer: Self-pay | Admitting: Surgical

## 2021-11-08 ENCOUNTER — Other Ambulatory Visit: Payer: Self-pay

## 2021-11-08 ENCOUNTER — Ambulatory Visit (INDEPENDENT_AMBULATORY_CARE_PROVIDER_SITE_OTHER): Payer: BC Managed Care – PPO | Admitting: Surgical

## 2021-11-08 DIAGNOSIS — Z9013 Acquired absence of bilateral breasts and nipples: Secondary | ICD-10-CM

## 2021-11-08 DIAGNOSIS — C50811 Malignant neoplasm of overlapping sites of right female breast: Secondary | ICD-10-CM

## 2021-11-08 NOTE — Progress Notes (Signed)
51 year old female here for follow-up of her bilateral breast reconstruction with placement of tissue expanders on 09/16/2021.  She is doing well.  She reports she had some tenderness and pain in the left breast after her last expansion, felt nerve related per patient.  She also had a small bruise.  She is otherwise doing well.  She feels as if less fluid has accumulated in the right breast.  She has continued to see PT. ? ?Chaperone present on exam ?On exam bilateral breast incisions are intact, healing well.  Small amount of subcutaneous fluid collection palpated in the right lateral breast.  No skin erythema. ? ?We placed injectable saline in the Expander using a sterile technique: ?Right: 65 cc for a total of 460 / 535 cc ?Left: 65 cc for a total of 460 / 535 cc ? ?Patient is scheduled to follow-up in 2 weeks for additional fill.  This will likely be one of her last fills, suspect may be 2 more fills. ? ?No signs of infection. ?

## 2021-11-11 ENCOUNTER — Encounter: Payer: Self-pay | Admitting: Physical Therapy

## 2021-11-11 ENCOUNTER — Other Ambulatory Visit: Payer: Self-pay

## 2021-11-11 ENCOUNTER — Ambulatory Visit: Payer: BC Managed Care – PPO | Admitting: Physical Therapy

## 2021-11-11 DIAGNOSIS — R600 Localized edema: Secondary | ICD-10-CM

## 2021-11-11 DIAGNOSIS — M25612 Stiffness of left shoulder, not elsewhere classified: Secondary | ICD-10-CM | POA: Diagnosis not present

## 2021-11-11 DIAGNOSIS — D0512 Intraductal carcinoma in situ of left breast: Secondary | ICD-10-CM

## 2021-11-11 DIAGNOSIS — M25611 Stiffness of right shoulder, not elsewhere classified: Secondary | ICD-10-CM

## 2021-11-11 DIAGNOSIS — D0511 Intraductal carcinoma in situ of right breast: Secondary | ICD-10-CM

## 2021-11-11 DIAGNOSIS — R293 Abnormal posture: Secondary | ICD-10-CM

## 2021-11-11 NOTE — Therapy (Signed)
[place] ?[facility] ?[place], [place], [address] ?Phone: [phone]   Fax:  [phone] ? ?Physical Therapy Treatment ? ?Patient Details  ?Name: Diana Tate ?MRN: [id number] ?Date of Birth: [birth year] ?Referring Provider (PT): Dr. Marlou Starks ? ? ?Encounter Date: 11/11/2021 ? ? PT End of Session - 11/11/21 1700   ? ? Visit Number 11   ? Number of Visits 18   ? Date for PT Re-Evaluation 12/04/21   ? PT Start Time 1603   ? PT Stop Time 6962   ? PT Time Calculation (min) 55 min   ? Activity Tolerance Patient tolerated treatment well   ? Behavior During Therapy [facility] for tasks assessed/performed   ? ?  ?  ? ?  ? ? ?Past Medical History:  ?Diagnosis Date  ? Cancer [facility])   ? GERD (gastroesophageal reflux disease)   ? Hyperlipidemia   ? Migraine   ? Classic  ? Sleep apnea   ? ? ?Past Surgical History:  ?Procedure Laterality Date  ? AXILLARY SENTINEL NODE BIOPSY Bilateral 09/16/2021  ? Procedure: BILATERAL AXILLARY SENTINEL NODE BIOPSY;  Surgeon: Jovita Kussmaul, MD;  Location: [place];  Service: General;  Laterality: Bilateral;  ? BREAST BIOPSY Right 08/02/2021  ? 2 areas  ? BREAST BIOPSY Left 08/02/2021  ? BREAST EXCISIONAL BIOPSY Right 04/2012  ? BREAST RECONSTRUCTION WITH PLACEMENT OF TISSUE EXPANDER AND FLEX HD (ACELLULAR HYDRATED DERMIS) Bilateral 09/16/2021  ? Procedure: BREAST RECONSTRUCTION WITH PLACEMENT OF TISSUE EXPANDER AND FLEX HD (ACELLULAR HYDRATED DERMIS);  Surgeon: Wallace Going, DO;  Location: [place];  Service: Plastics;  Laterality: Bilateral;  ? BREAST SURGERY    ? benign  ? CHOLECYSTECTOMY  2006  ? ENDOMETRIAL ABLATION  2007  ? ESOPHAGOGASTRODUODENOSCOPY N/A 05/22/2014  ? SLF: 1. Schatzki ring was found 2. MIld non-erosive gastritis  ? TONSILLECTOMY  2000  ? TOTAL MASTECTOMY Bilateral 09/16/2021  ? Procedure: BILATERAL TOTAL MASTECTOMY;  Surgeon: Jovita Kussmaul, MD;  Location: [place];  Service: General;  Laterality: Bilateral;  ? ? ?There were  no vitals filed for this visit. ? ? Subjective Assessment - 11/11/21 1705   ? ? Subjective I am not as tight today as I was after the last fill.   ? 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 Patient Stated Goals to gain info from providers   ? Currently in Pain? No/denies   ? Pain Score 0-No pain   ? ?  ?  ? ?  ? ? ? ? ? ? ? ? ? ? ? ? ? ? ? ? ? ? ? ? [facility] Adult PT Treatment/Exercise - 11/11/21 0001   ? ?  ? Shoulder Exercises: Pulleys  ? Flexion 2 minutes   ? ABduction 2 minutes   ?  ? Shoulder Exercises: Therapy Ball  ? Flexion Both;10 reps   with stretch at end range  ? ABduction Both;10 reps   with stretch at end range  ?  ? Manual Therapy  ? Soft tissue mobilization to scar tissue at right drain site, and in L sidelying to R rhomboids and upper traps using cocoa butter with numerous areas of muscle tightness palpable then to R sidelying to work on the opposite side   ? Passive ROM to bilateral shoulders in direction of flexion and abduction   ? ?  ?  ? ?  ? ? ? ? ? ? ? ? ? ?  PT Education - 11/11/21 1701   ? ? Education Details wearing a compression sleeve when flying and possibility of insurance not covering cost of compression sleeve   ? Person(s) Educated Patient   ? Methods Explanation   ? Comprehension Verbalized understanding   ? ?  ?  ? ?  ? ? ? ? ? ? PT Long Term Goals - 11/06/21 1421   ? ?  ? PT LONG TERM GOAL #1  ? Title Pt will return to baseline shoulder ROM and not demonstrate any signs or symptoms of lymphedema.   ? Time 6   ? Period Weeks   ? Status Achieved   ?  ? PT LONG TERM GOAL #2  ? Title Pt will report a 50% improvement in feelings of tightness across bilateral chest just inferior to breasts   ? Baseline 11/06/21- pt reports this changes after each fill of her expanders, 60-70% improvement   ? Time 4   ? Period Weeks   ? Status Achieved   ?  ? PT LONG TERM GOAL #3  ? Title Pt will demonstrate 160  degrees of L shoulder flexion to allow her to reach overhead.   ? Baseline 150; 11/06/21- 166   ? Time 4   ? Period Weeks   ? Status Achieved   ?  ? PT LONG TERM GOAL #4  ? Title Pt will have decreased spasms in upper abdomen and decreased tightness in bilateral axilla after expander fills to allow improved comfort.   ? Time 4   ? Period Weeks   ? Status New   ? Target Date 12/04/21   ?  ? PT LONG TERM GOAL #5  ? Title Pt will be independent in a home exercise program for continued stretching and strengthening.   ? Time 4   ? Period Weeks   ? Status New   ? Target Date 12/04/21   ? ?  ?  ? ?  ? ? ? ? ? ? ? ? Plan - 11/11/21 1701   ? ? Clinical Impression Statement Pt reports overall her shoulder ROM does not feel any more tight after her most recent fill. The upper abdominal pain has also improved and not worsened since last fill. Pt is having more discomfort in her rhomboids and upper traps and she reports this happens every 3 to 4 months. Added soft tissue mobilization to this area today to help improve comfort with pt stating she could tell it was feeling better by end of session. Continued with PROM to bilateral shoulders and scar mobilization to R drain scar in area of tenderness.   ? PT Frequency 2x / week   ? PT Duration 4 weeks   ? PT Treatment/Interventions ADLs/Self Care Home Management;Patient/family education;Therapeutic exercise;Manual techniques;Manual lymph drainage;Scar mobilization;Passive range of motion   ? PT Next Visit Plan send demographics to [facility], do PROM to bilat sh, pulleys, ball, MLD R lateral trunk and axilla, manual therapy to upper abdomen to decrease spasms   ? PT Home Exercise Plan post op breast exercises, supine scap   ? Consulted and Agree with Plan of Care Patient   ? ?  ?  ? ?  ? ? ?Patient will benefit from skilled therapeutic intervention in order to improve the following deficits and impairments:  Postural dysfunction, Decreased knowledge of precautions, Increased fascial  restricitons, Decreased range of motion, Decreased scar mobility ? ?Visit Diagnosis: ?Stiffness of left shoulder, not elsewhere classified ? ?Stiffness of right  shoulder, not elsewhere classified ? ?Localized edema ? ?Abnormal posture ? ?Ductal carcinoma in situ (DCIS) of right breast ? ?Ductal carcinoma in situ (DCIS) of left breast ? ? ? ? ?Problem List ?Patient Active Problem List  ? Diagnosis Date Noted  ? Acquired absence of breast 09/27/2021  ? Breast cancer ([place]) 09/16/2021  ? 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 Former smoker 12/07/2020  ? History of COVID-19 11/21/2019  ? Obstructive sleep apnea 02/20/2016  ? Insomnia 12/03/2014  ? Alternating constipation and diarrhea 05/16/2014  ? Abdominal pain, epigastric 05/16/2014  ? Esophageal reflux 12/12/2013  ? Migraine headache with aura 03/30/2013  ? 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 ? ?Allyson Sabal [place], PT ?11/11/2021, 5:05 PM ? ?[place] ?[facility] ?[place], [place], [address] ?Phone: [phone]   Fax:  [phone] ? ?Name: Diana Tate ?MRN: [id number] ?Date of Birth: Jan 06, 1971 ? ? ? ?

## 2021-11-13 ENCOUNTER — Encounter: Payer: Self-pay | Admitting: Physical Therapy

## 2021-11-13 ENCOUNTER — Ambulatory Visit: Payer: BC Managed Care – PPO | Admitting: Physical Therapy

## 2021-11-13 DIAGNOSIS — M25611 Stiffness of right shoulder, not elsewhere classified: Secondary | ICD-10-CM

## 2021-11-13 DIAGNOSIS — D0512 Intraductal carcinoma in situ of left breast: Secondary | ICD-10-CM

## 2021-11-13 DIAGNOSIS — R600 Localized edema: Secondary | ICD-10-CM

## 2021-11-13 DIAGNOSIS — M25612 Stiffness of left shoulder, not elsewhere classified: Secondary | ICD-10-CM | POA: Diagnosis not present

## 2021-11-13 DIAGNOSIS — R293 Abnormal posture: Secondary | ICD-10-CM

## 2021-11-13 DIAGNOSIS — D0511 Intraductal carcinoma in situ of right breast: Secondary | ICD-10-CM

## 2021-11-13 NOTE — Therapy (Signed)
[place] ?[facility] [place] ?[place], [place], [address] ?Phone: [phone]   Fax:  [phone] ? ?Physical Therapy Treatment ? ?Patient Details  ?Name: Diana Tate ?MRN: [id number] ?Date of Birth: [birth year] ?Referring Provider (PT): Dr. Marlou Starks ? ? ?Encounter Date: 11/13/2021 ? ? PT End of Session - 11/13/21 1649   ? ? Visit Number 12   ? Number of Visits 18   ? Date for PT Re-Evaluation 12/04/21   ? PT Start Time 9702   ? PT Stop Time 6378   ? PT Time Calculation (min) 44 min   ? Activity Tolerance Patient tolerated treatment well   ? Behavior During Therapy [facility] for tasks assessed/performed   ? ?  ?  ? ?  ? ? ?Past Medical History:  ?Diagnosis Date  ? Cancer [facility])   ? GERD (gastroesophageal reflux disease)   ? Hyperlipidemia   ? Migraine   ? Classic  ? Sleep apnea   ? ? ?Past Surgical History:  ?Procedure Laterality Date  ? AXILLARY SENTINEL NODE BIOPSY Bilateral 09/16/2021  ? Procedure: BILATERAL AXILLARY SENTINEL NODE BIOPSY;  Surgeon: Jovita Kussmaul, MD;  Location: [place];  Service: General;  Laterality: Bilateral;  ? BREAST BIOPSY Right 08/02/2021  ? 2 areas  ? BREAST BIOPSY Left 08/02/2021  ? BREAST EXCISIONAL BIOPSY Right 04/2012  ? BREAST RECONSTRUCTION WITH PLACEMENT OF TISSUE EXPANDER AND FLEX HD (ACELLULAR HYDRATED DERMIS) Bilateral 09/16/2021  ? Procedure: BREAST RECONSTRUCTION WITH PLACEMENT OF TISSUE EXPANDER AND FLEX HD (ACELLULAR HYDRATED DERMIS);  Surgeon: Wallace Going, DO;  Location: [place];  Service: Plastics;  Laterality: Bilateral;  ? BREAST SURGERY    ? benign  ? CHOLECYSTECTOMY  2006  ? ENDOMETRIAL ABLATION  2007  ? ESOPHAGOGASTRODUODENOSCOPY N/A 05/22/2014  ? SLF: 1. Schatzki ring was found 2. MIld non-erosive gastritis  ? TONSILLECTOMY  2000  ? TOTAL MASTECTOMY Bilateral 09/16/2021  ? Procedure: BILATERAL TOTAL MASTECTOMY;  Surgeon: Jovita Kussmaul, MD;  Location: [place];  Service: General;  Laterality: Bilateral;  ? ? ?There were  no vitals filed for this visit. ? ? Subjective Assessment - 11/13/21 1650   ? ? Subjective I am not as tight today.   ? 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 Patient Stated Goals to gain info from providers   ? Currently in Pain? No/denies   ? Pain Score 0-No pain   ? ?  ?  ? ?  ? ? ? ? ? ? ? ? ? ? ? ? ? ? ? ? ? ? ? ? [place] Adult PT Treatment/Exercise - 11/13/21 0001   ? ?  ? Manual Therapy  ? Soft tissue mobilization to upper abdomen bilaterally where pt feels tightness and pulling with overhead arm movements   ? Passive ROM to bilateral shoulders in direction of flexion and abduction   ? ?  ?  ? ?  ? ? ? ? ? ? ? ? ? ? ? ? ? ? ? PT Long Term Goals - 11/06/21 1421   ? ?  ? PT LONG TERM GOAL #1  ? Title Pt will return to baseline shoulder ROM and not demonstrate any signs or symptoms of lymphedema.   ? Time 6   ? Period Weeks   ? Status Achieved   ?  ? PT LONG TERM GOAL #2  ?  Title Pt will report a 50% improvement in feelings of tightness across bilateral chest just inferior to breasts   ? Baseline 11/06/21- pt reports this changes after each fill of her expanders, 60-70% improvement   ? Time 4   ? Period Weeks   ? Status Achieved   ?  ? PT LONG TERM GOAL #3  ? Title Pt will demonstrate 160 degrees of L shoulder flexion to allow her to reach overhead.   ? Baseline 150; 11/06/21- 166   ? Time 4   ? Period Weeks   ? Status Achieved   ?  ? PT LONG TERM GOAL #4  ? Title Pt will have decreased spasms in upper abdomen and decreased tightness in bilateral axilla after expander fills to allow improved comfort.   ? Time 4   ? Period Weeks   ? Status New   ? Target Date 12/04/21   ?  ? PT LONG TERM GOAL #5  ? Title Pt will be independent in a home exercise program for continued stretching and strengthening.   ? Time 4   ? Period Weeks   ? Status New   ? Target Date 12/04/21   ? ?  ?  ? ?  ? ? ? ? ? ? ? ? Plan - 11/13/21 1651   ? ?  Clinical Impression Statement Pt reports that her upper back pain felt improved after last session but returned. She recently went to the chiropractor and is no longer having pain like she was last session. She also reports decreased tightness in upper abdominal area but still feels pulling near drain site especially on L side with overhead movement. Continued to focus on manual therapy to this area to improve comfort today.   ? PT Frequency 2x / week   ? PT Duration 4 weeks   ? PT Treatment/Interventions ADLs/Self Care Home Management;Patient/family education;Therapeutic exercise;Manual techniques;Manual lymph drainage;Scar mobilization;Passive range of motion   ? PT Next Visit Plan hear back from [facility]?, do PROM to bilat sh, pulleys, ball, MLD R lateral trunk and axilla, manual therapy to upper abdomen to decrease spasms   ? PT Home Exercise Plan post op breast exercises, supine scap   ? Recommended Other Services demographics sent to [facility]   ? Consulted and Agree with Plan of Care Patient   ? ?  ?  ? ?  ? ? ?Patient will benefit from skilled therapeutic intervention in order to improve the following deficits and impairments:  Postural dysfunction, Decreased knowledge of precautions, Increased fascial restricitons, Decreased range of motion, Decreased scar mobility ? ?Visit Diagnosis: ?Stiffness of left shoulder, not elsewhere classified ? ?Stiffness of right shoulder, not elsewhere classified ? ?Localized edema ? ?Abnormal posture ? ?Ductal carcinoma in situ (DCIS) of right breast ? ?Ductal carcinoma in situ (DCIS) of left breast ? ? ? ? ?Problem List ?Patient Active Problem List  ? Diagnosis Date Noted  ? Acquired absence of breast 09/27/2021  ? Breast cancer ([place]) 09/16/2021  ? 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 Former smoker 12/07/2020  ? History of COVID-19 11/21/2019  ? Obstructive sleep apnea 02/20/2016  ? Insomnia 12/03/2014  ? Alternating constipation and diarrhea 05/16/2014   ? Abdominal pain, epigastric 05/16/2014  ? Esophageal reflux 12/12/2013  ? Migraine headache with aura 03/30/2013  ? 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 ? ?Allyson Sabal [place], PT ?11/13/2021, 4:54 PM ? ?[place] ?[place] [facility]  [place] ?[place], [place], [address] ?Phone: [phone]   Fax:  [phone] ? ?Name: Diana Tate ?MRN: [id number] ?Date of Birth: 1971/07/30 ? ? ? ?

## 2021-11-14 ENCOUNTER — Telehealth: Payer: Self-pay | Admitting: Plastic Surgery

## 2021-11-14 NOTE — Telephone Encounter (Signed)
LVM to discuss possible date for surgery.  ?

## 2021-11-18 ENCOUNTER — Encounter: Payer: Self-pay | Admitting: Physical Therapy

## 2021-11-18 ENCOUNTER — Ambulatory Visit: Payer: BC Managed Care – PPO | Attending: General Surgery | Admitting: Physical Therapy

## 2021-11-18 DIAGNOSIS — R293 Abnormal posture: Secondary | ICD-10-CM | POA: Insufficient documentation

## 2021-11-18 DIAGNOSIS — R600 Localized edema: Secondary | ICD-10-CM | POA: Insufficient documentation

## 2021-11-18 DIAGNOSIS — D0511 Intraductal carcinoma in situ of right breast: Secondary | ICD-10-CM | POA: Diagnosis present

## 2021-11-18 DIAGNOSIS — D0512 Intraductal carcinoma in situ of left breast: Secondary | ICD-10-CM | POA: Diagnosis present

## 2021-11-18 DIAGNOSIS — M25612 Stiffness of left shoulder, not elsewhere classified: Secondary | ICD-10-CM | POA: Insufficient documentation

## 2021-11-18 DIAGNOSIS — M25611 Stiffness of right shoulder, not elsewhere classified: Secondary | ICD-10-CM | POA: Insufficient documentation

## 2021-11-18 NOTE — Therapy (Signed)
[facility] ?[facility] [place] ?[place], [place], [address] ?Phone: [phone]   Fax:  [phone] ? ?Physical Therapy Treatment ? ?Patient Details  ?Name: Diana Tate ?MRN: [id number] ?Date of Birth: [birth year] ?Referring Provider (PT): Dr. Marlou Starks ? ? ?Encounter Date: 11/18/2021 ? ? PT End of Session - 11/18/21 1503   ? ? Visit Number 13   ? Number of Visits 18   ? Date for PT Re-Evaluation 12/04/21   ? PT Start Time 1500   ? PT Stop Time 3244   ? PT Time Calculation (min) 49 min   ? Activity Tolerance Patient tolerated treatment well   ? Behavior During Therapy [facility] for tasks assessed/performed   ? ?  ?  ? ?  ? ? ?Past Medical History:  ?Diagnosis Date  ? Cancer [facility])   ? GERD (gastroesophageal reflux disease)   ? Hyperlipidemia   ? Migraine   ? Classic  ? Sleep apnea   ? ? ?Past Surgical History:  ?Procedure Laterality Date  ? AXILLARY SENTINEL NODE BIOPSY Bilateral 09/16/2021  ? Procedure: BILATERAL AXILLARY SENTINEL NODE BIOPSY;  Surgeon: Jovita Kussmaul, MD;  Location: [place];  Service: General;  Laterality: Bilateral;  ? BREAST BIOPSY Right 08/02/2021  ? 2 areas  ? BREAST BIOPSY Left 08/02/2021  ? BREAST EXCISIONAL BIOPSY Right 04/2012  ? BREAST RECONSTRUCTION WITH PLACEMENT OF TISSUE EXPANDER AND FLEX HD (ACELLULAR HYDRATED DERMIS) Bilateral 09/16/2021  ? Procedure: BREAST RECONSTRUCTION WITH PLACEMENT OF TISSUE EXPANDER AND FLEX HD (ACELLULAR HYDRATED DERMIS);  Surgeon: Wallace Going, DO;  Location: [place];  Service: Plastics;  Laterality: Bilateral;  ? BREAST SURGERY    ? benign  ? CHOLECYSTECTOMY  2006  ? ENDOMETRIAL ABLATION  2007  ? ESOPHAGOGASTRODUODENOSCOPY N/A 05/22/2014  ? SLF: 1. Schatzki ring was found 2. MIld non-erosive gastritis  ? TONSILLECTOMY  2000  ? TOTAL MASTECTOMY Bilateral 09/16/2021  ? Procedure: BILATERAL TOTAL MASTECTOMY;  Surgeon: Jovita Kussmaul, MD;  Location: [place];  Service: General;  Laterality: Bilateral;  ? ? ?There were  no vitals filed for this visit. ? ? Subjective Assessment - 11/18/21 1503   ? ? Subjective My implant exchange surgery is scheduled for 12/05/21. I have not been having any tightness.   ? 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 Patient Stated Goals to gain info from providers   ? Currently in Pain? No/denies   ? Pain Score 0-No pain   ? ?  ?  ? ?  ? ? ? ? ? ? ? ? ? ? ? ? ? ? ? ? ? ? ? ? [facility] Adult PT Treatment/Exercise - 11/18/21 0001   ? ?  ? Shoulder Exercises: Pulleys  ? Flexion 2 minutes   ? ABduction 2 minutes   ?  ? Shoulder Exercises: Therapy Ball  ? Flexion Both;10 reps   with stretch at end range  ? ABduction Both;10 reps   with stretch at end range  ?  ? Manual Therapy  ? Soft tissue mobilization to upper abdomen bilaterally where pt feels tightness and pulling with overhead arm movements   ? Myofascial Release to scar tissue at drain sites   ? Passive ROM to bilateral shoulders in direction of flexion and abduction   ? ?  ?  ? ?  ? ? ? ? ? ? ? ? ? ? ? ? ? ? ?  PT Long Term Goals - 11/06/21 1421   ? ?  ? PT LONG TERM GOAL #1  ? Title Pt will return to baseline shoulder ROM and not demonstrate any signs or symptoms of lymphedema.   ? Time 6   ? Period Weeks   ? Status Achieved   ?  ? PT LONG TERM GOAL #2  ? Title Pt will report a 50% improvement in feelings of tightness across bilateral chest just inferior to breasts   ? Baseline 11/06/21- pt reports this changes after each fill of her expanders, 60-70% improvement   ? Time 4   ? Period Weeks   ? Status Achieved   ?  ? PT LONG TERM GOAL #3  ? Title Pt will demonstrate 160 degrees of L shoulder flexion to allow her to reach overhead.   ? Baseline 150; 11/06/21- 166   ? Time 4   ? Period Weeks   ? Status Achieved   ?  ? PT LONG TERM GOAL #4  ? Title Pt will have decreased spasms in upper abdomen and decreased tightness in bilateral axilla after expander fills to allow  improved comfort.   ? Time 4   ? Period Weeks   ? Status New   ? Target Date 12/04/21   ?  ? PT LONG TERM GOAL #5  ? Title Pt will be independent in a home exercise program for continued stretching and strengthening.   ? Time 4   ? Period Weeks   ? Status New   ? Target Date 12/04/21   ? ?  ?  ? ?  ? ? ? ? ? ? ? ? Plan - 11/18/21 1551   ? ? Clinical Impression Statement Pt reports that her implant exchange surgery has been scheduled for 12/05/21. She has another expander fill tomorrow and will possibly have another fill prior to surgery. Her tightness is improving and she is having less upper abdominal tightness. Continued with AAROM stretches and PROM as well as manual therapy to decrease scar tissue at drain sites.   ? PT Frequency 2x / week   ? PT Duration 4 weeks   ? PT Treatment/Interventions ADLs/Self Care Home Management;Patient/family education;Therapeutic exercise;Manual techniques;Manual lymph drainage;Scar mobilization;Passive range of motion   ? PT Next Visit Plan talk to pt about [employer] - no coverage for garments, do PROM to bilat sh, pulleys, ball, MLD R lateral trunk and axilla, manual therapy to upper abdomen to decrease spasms   ? PT Home Exercise Plan post op breast exercises, supine scap   ? Consulted and Agree with Plan of Care Patient   ? ?  ?  ? ?  ? ? ?Patient will benefit from skilled therapeutic intervention in order to improve the following deficits and impairments:  Postural dysfunction, Decreased knowledge of precautions, Increased fascial restricitons, Decreased range of motion, Decreased scar mobility ? ?Visit Diagnosis: ?Stiffness of left shoulder, not elsewhere classified ? ?Stiffness of right shoulder, not elsewhere classified ? ?Localized edema ? ?Abnormal posture ? ?Ductal carcinoma in situ (DCIS) of right breast ? ?Ductal carcinoma in situ (DCIS) of left breast ? ? ? ? ?Problem List ?Patient Active Problem List  ? Diagnosis Date Noted  ? Acquired absence of breast 09/27/2021  ?  Breast cancer ([facility]) 09/16/2021  ? 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 Former smoker 12/07/2020  ? History of COVID-19 11/21/2019  ? Obstructive sleep apnea 02/20/2016  ? Insomnia 12/03/2014  ? Alternating  constipation and diarrhea 05/16/2014  ? Abdominal pain, epigastric 05/16/2014  ? Esophageal reflux 12/12/2013  ? Migraine headache with aura 03/30/2013  ? 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 ? ?Allyson Sabal Thrall, PT ?11/18/2021, 3:55 PM ? ?[facility] ?[facility] [place] ?[place], [place], [address] ?Phone: [phone]   Fax:  [phone] ? ?Name: Elinore Shults [place] ?MRN: [id number] ?Date of Birth: 1971-07-04 ? ? ? ?

## 2021-11-18 NOTE — Progress Notes (Signed)
Patient is a 51 year old female here for follow-up on her bilateral breast reconstruction.  She currently has tissue expanders in place that were placed on 09/16/2021.  She has been doing well with expansion.  She is here with her husband.  She is doing really well.  She would like an additional fill.  ? ?We are currently planning for exchange of bilateral breast tissue expanders for bilateral breast implants on 12/05/2021.  She reports her goal is to be a proximately a D cup.  She would like to be larger if possible, however she is aware that the skin tightness may be a limiting factor. ? ?Chaperone present on exam ?On exam bilateral breast incisions are intact, healing well.  There is no erythema or cellulitic changes.  No subcutaneous fluid collections noted with palpation. ? ?We placed injectable saline in the Expander using a sterile technique: ?Right: 80 cc for a total of 540 / 535 cc ?Left: 80 cc for a total of 540 / 535 cc ? ? ?Pictures were taken and placed in the patient's chart.  Provided patient with consent forms for expander to implant exchange.  Provided patient with a [facility] implant checklist to review implant risks.  We discussed the benefits and risks of silicone versus saline implants.  Patient would like to go with silicone. ? ?We may do an additional fill at her preop, approximately 20 to 40 cc for a total of 560 to 580 cc. ?

## 2021-11-19 ENCOUNTER — Ambulatory Visit (INDEPENDENT_AMBULATORY_CARE_PROVIDER_SITE_OTHER): Payer: BC Managed Care – PPO | Admitting: Surgical

## 2021-11-19 DIAGNOSIS — C50811 Malignant neoplasm of overlapping sites of right female breast: Secondary | ICD-10-CM

## 2021-11-19 DIAGNOSIS — Z9013 Acquired absence of bilateral breasts and nipples: Secondary | ICD-10-CM

## 2021-11-19 NOTE — Addendum Note (Signed)
Addended by: Tresa Moore on: 11/19/2021 04:47 PM ? ? Modules accepted: Orders ? ?

## 2021-11-21 ENCOUNTER — Ambulatory Visit: Payer: BC Managed Care – PPO | Admitting: Physical Therapy

## 2021-11-21 ENCOUNTER — Encounter: Payer: Self-pay | Admitting: Physical Therapy

## 2021-11-21 DIAGNOSIS — D0511 Intraductal carcinoma in situ of right breast: Secondary | ICD-10-CM

## 2021-11-21 DIAGNOSIS — R293 Abnormal posture: Secondary | ICD-10-CM

## 2021-11-21 DIAGNOSIS — R600 Localized edema: Secondary | ICD-10-CM

## 2021-11-21 DIAGNOSIS — M25612 Stiffness of left shoulder, not elsewhere classified: Secondary | ICD-10-CM | POA: Diagnosis not present

## 2021-11-21 DIAGNOSIS — D0512 Intraductal carcinoma in situ of left breast: Secondary | ICD-10-CM

## 2021-11-21 DIAGNOSIS — M25611 Stiffness of right shoulder, not elsewhere classified: Secondary | ICD-10-CM

## 2021-11-21 NOTE — Therapy (Signed)
[facility] ?[facility] [place] ?[place], [place], [address] ?Phone: [phone]   Fax:  [phone] ? ?Physical Therapy Treatment ? ?Patient Details  ?Name: Diana Tate ?MRN: [id number] ?Date of Birth: [birth year] ?Referring Provider (PT): Dr. Marlou Starks ? ? ?Encounter Date: 11/21/2021 ? ? PT End of Session - 11/21/21 1351   ? ? Visit Number 14   ? Number of Visits 18   ? Date for PT Re-Evaluation 12/04/21   ? PT Start Time 1302   ? PT Stop Time 1348   ? PT Time Calculation (min) 46 min   ? Activity Tolerance Patient tolerated treatment well   ? Behavior During Therapy [facility] for tasks assessed/performed   ? ?  ?  ? ?  ? ? ?Past Medical History:  ?Diagnosis Date  ? Cancer [facility])   ? GERD (gastroesophageal reflux disease)   ? Hyperlipidemia   ? Migraine   ? Classic  ? Sleep apnea   ? ? ?Past Surgical History:  ?Procedure Laterality Date  ? AXILLARY SENTINEL NODE BIOPSY Bilateral 09/16/2021  ? Procedure: BILATERAL AXILLARY SENTINEL NODE BIOPSY;  Surgeon: Jovita Kussmaul, MD;  Location: [facility];  Service: General;  Laterality: Bilateral;  ? BREAST BIOPSY Right 08/02/2021  ? 2 areas  ? BREAST BIOPSY Left 08/02/2021  ? BREAST EXCISIONAL BIOPSY Right 04/2012  ? BREAST RECONSTRUCTION WITH PLACEMENT OF TISSUE EXPANDER AND FLEX HD (ACELLULAR HYDRATED DERMIS) Bilateral 09/16/2021  ? Procedure: BREAST RECONSTRUCTION WITH PLACEMENT OF TISSUE EXPANDER AND FLEX HD (ACELLULAR HYDRATED DERMIS);  Surgeon: Wallace Going, DO;  Location: [facility];  Service: Plastics;  Laterality: Bilateral;  ? BREAST SURGERY    ? benign  ? CHOLECYSTECTOMY  2006  ? ENDOMETRIAL ABLATION  2007  ? ESOPHAGOGASTRODUODENOSCOPY N/A 05/22/2014  ? SLF: 1. Schatzki ring was found 2. MIld non-erosive gastritis  ? TONSILLECTOMY  2000  ? TOTAL MASTECTOMY Bilateral 09/16/2021  ? Procedure: BILATERAL TOTAL MASTECTOMY;  Surgeon: Jovita Kussmaul, MD;  Location: [facility];  Service: General;  Laterality: Bilateral;  ? ? ?There were  no vitals filed for this visit. ? ? Subjective Assessment - 11/21/21 1355   ? ? Subjective I had another fill. I will have one more before my surgery.   ? 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 Patient Stated Goals to gain info from providers   ? Currently in Pain? No/denies   ? Pain Score 0-No pain   ? ?  ?  ? ?  ? ? ? ? ? ? ? ? ? ? ? ? ? ? ? ? ? ? ? ? [facility] Adult PT Treatment/Exercise - 11/21/21 0001   ? ?  ? Manual Therapy  ? Soft tissue mobilization to upper abdomen bilaterally where pt feels tightness and pulling with overhead arm movements   ? Myofascial Release to scar tissue at drain sites   ? Passive ROM briefly to bilateral shoulders in direction of flexion and abduction but no stretch felt   ? ?  ?  ? ?  ? ? ? ? ? ? ? ? ? ? ? ? ? ? ? PT Long Term Goals - 11/06/21 1421   ? ?  ? PT LONG TERM GOAL #1  ? Title Pt will return to baseline shoulder ROM and not demonstrate any signs or symptoms of lymphedema.   ? Time 6   ?  Period Weeks   ? Status Achieved   ?  ? PT LONG TERM GOAL #2  ? Title Pt will report a 50% improvement in feelings of tightness across bilateral chest just inferior to breasts   ? Baseline 11/06/21- pt reports this changes after each fill of her expanders, 60-70% improvement   ? Time 4   ? Period Weeks   ? Status Achieved   ?  ? PT LONG TERM GOAL #3  ? Title Pt will demonstrate 160 degrees of L shoulder flexion to allow her to reach overhead.   ? Baseline 150; 11/06/21- 166   ? Time 4   ? Period Weeks   ? Status Achieved   ?  ? PT LONG TERM GOAL #4  ? Title Pt will have decreased spasms in upper abdomen and decreased tightness in bilateral axilla after expander fills to allow improved comfort.   ? Time 4   ? Period Weeks   ? Status New   ? Target Date 12/04/21   ?  ? PT LONG TERM GOAL #5  ? Title Pt will be independent in a home exercise program for continued stretching and strengthening.   ? Time 4    ? Period Weeks   ? Status New   ? Target Date 12/04/21   ? ?  ?  ? ?  ? ? ? ? ? ? ? ? Plan - 11/21/21 1352   ? ? Clinical Impression Statement Pt had another fill on Tuesday. She has done well with only minimal tightness just inferior to her expanders. Spent todays session working on scar mobilization at bilateral drain sites and working on area of tigthness under bilateral expanders.   ? PT Frequency 2x / week   ? PT Duration 4 weeks   ? PT Treatment/Interventions ADLs/Self Care Home Management;Patient/family education;Therapeutic exercise;Manual techniques;Manual lymph drainage;Scar mobilization;Passive range of motion   ? PT Next Visit Plan talk to pt about [employer] - no coverage for garments, do PROM to bilat sh, pulleys, ball, MLD R lateral trunk and axilla, manual therapy to upper abdomen to decrease spasms   ? Consulted and Agree with Plan of Care Patient   ? ?  ?  ? ?  ? ? ?Patient will benefit from skilled therapeutic intervention in order to improve the following deficits and impairments:  Postural dysfunction, Decreased knowledge of precautions, Increased fascial restricitons, Decreased range of motion, Decreased scar mobility ? ?Visit Diagnosis: ?Stiffness of left shoulder, not elsewhere classified ? ?Stiffness of right shoulder, not elsewhere classified ? ?Localized edema ? ?Abnormal posture ? ?Ductal carcinoma in situ (DCIS) of right breast ? ?Ductal carcinoma in situ (DCIS) of left breast ? ? ? ? ?Problem List ?Patient Active Problem List  ? Diagnosis Date Noted  ? Acquired absence of breast 09/27/2021  ? Breast cancer ([place]) 09/16/2021  ? 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 Former smoker 12/07/2020  ? History of COVID-19 11/21/2019  ? Obstructive sleep apnea 02/20/2016  ? Insomnia 12/03/2014  ? Alternating constipation and diarrhea 05/16/2014  ? Abdominal pain, epigastric 05/16/2014  ? Esophageal reflux 12/12/2013  ? Migraine headache with aura 03/30/2013  ? Atypical ductal  hyperplasia of breast 04/07/2012  ? ? ?Allyson Sabal [place], PT ?11/21/2021, 1:55 PM ? ?[facility] ?[facility] [place] ?[place], [place], [address] ?Phone: [phone]   Fax:  [phone] ? ?Name: Diana Tate ?MRN: [id number] ?Date of Birth: 11-Aug-1971 ? ? ? ?

## 2021-11-26 ENCOUNTER — Encounter (HOSPITAL_BASED_OUTPATIENT_CLINIC_OR_DEPARTMENT_OTHER): Payer: Self-pay | Admitting: Plastic Surgery

## 2021-11-26 ENCOUNTER — Other Ambulatory Visit: Payer: Self-pay

## 2021-11-26 ENCOUNTER — Inpatient Hospital Stay: Payer: BC Managed Care – PPO | Attending: Hematology and Oncology | Admitting: Physician Assistant

## 2021-11-26 VITALS — BP 118/73 | HR 77 | Temp 97.9°F | Resp 18 | Ht 65.0 in | Wt 202.9 lb

## 2021-11-26 DIAGNOSIS — Z713 Dietary counseling and surveillance: Secondary | ICD-10-CM | POA: Diagnosis not present

## 2021-11-26 DIAGNOSIS — Z9013 Acquired absence of bilateral breasts and nipples: Secondary | ICD-10-CM | POA: Insufficient documentation

## 2021-11-26 DIAGNOSIS — Z87891 Personal history of nicotine dependence: Secondary | ICD-10-CM | POA: Diagnosis not present

## 2021-11-26 DIAGNOSIS — D0511 Intraductal carcinoma in situ of right breast: Secondary | ICD-10-CM | POA: Diagnosis present

## 2021-11-28 NOTE — Progress Notes (Signed)
? ?CLINIC:  ?Survivorship  ? ?REASON FOR VISIT:  ?Routine follow-up post-treatment for a recent history of breast cancer. ? ?BRIEF ONCOLOGIC HISTORY:  ?Oncology History  ?Ductal carcinoma in situ (DCIS) of right breast  ?07/17/2021 Mammogram  ? CLINICAL DATA:  One year follow-up of probably benign right breast calcifications. ?  ?The patient also states that she has had a spontaneous yellow to ?blood-tinged right nipple discharge for several months. ?  ?EXAM: ?DIGITAL DIAGNOSTIC BILATERAL MAMMOGRAM WITH TOMOSYNTHESIS AND CAD; ?ULTRASOUND RIGHT BREAST LIMITED ? ?IMPRESSION: ?Right breast 9:30 o'clock ill-defined sonographically seen mass, ?which corresponds to an area of thickening on physical exam, for ?which ultrasound-guided core needle biopsy is recommended. Attention on postprocedural mammogram is recommended to establish whether this abnormality corresponds to the focal asymmetry seen mammographically the right breast upper outer quadrant, posterior depth. ?  ?Two groups of probably benign calcifications in the right breast ?upper outer quadrant, waxing and waning in nature, likely ?representing secretory calcifications associated with micro and ?macro cysts. ?  ?07/26/2021 Initial Biopsy  ? Diagnosis ?Breast, right, needle core biopsy, upper outer quadrant ?- DUCTAL CARCINOMA IN SITU ?- SEE COMMENT ?Microscopic Comment ?Based on the biopsy, the ductal carcinoma in situ has a solid pattern, intermediate nuclear grade and measures 0.6 cm ? ?PROGNOSTIC INDICATORS ?Results: ?Estrogen Receptor: 95%, POSITIVE, MODERATE STAINING INTENSITY ?Progesterone Receptor: 100%, POSITIVE, STRONG STAINING INTENSITY ?  ?08/02/2021 Pathology Results  ? Diagnosis ?1. Breast, right, needle core biopsy, lower outer quadrant ,anterior depth ?- DUCTAL CARCINOMA IN SITU WITH CALCIFICATIONS AND NECROSIS ?- SEE COMMENT ?2. Breast, right, needle core biopsy, lower outer quadrant, posterior depth ?- DUCTAL CARCINOMA IN SITU WITH  CALCIFICATIONS ?- SEE COMMENT ?3. Breast, left, needle core biopsy, upper outer quadrant ?- DUCTAL CARCINOMA IN SITU ?- SEE COMMENT ?Microscopic Comment ?1. The carcinoma in situ appears to partially be involving a papillary lesion. Based on the biopsy, the ductal carcinoma ?in situ has a comedo pattern, high nuclear grade and measures 0.4 cm in greatest linear extent.  ?3. Cytokeratin 5/6 is negative and ER strongly positive in the focus of ductal carcinoma in situ. Based on the biopsy, the ductal carcinoma in situ has a comedo-like pattern, intermediate nuclear grade and measures 0.1 cm in greatest linear extent.  ? ?1. PROGNOSTIC INDICATORS ?Results: ?Estrogen Receptor: 95%, POSITIVE, MODERATAE STAINING INTENSITY ?Progesterone Receptor: 100%, POSITIVE, STRONG STAINING INTENSITY ? ?3. PROGNOSTIC INDICATORS ?Results: ?Estrogen Receptor: 95%, POSITIVE, MODERATE STAINING INTENSITY ?Progesterone Receptor: >95%, POSITIVE, STRONG STAINING INTENSITY ?  ?08/09/2021 Initial Diagnosis  ? Ductal carcinoma in situ (DCIS) of right breast ?  ?09/16/2021 Definitive Surgery  ? FINAL MICROSCOPIC DIAGNOSIS:  ? ?A. BREAST, RIGHT, MASTECTOMY:  ?- Three foci of ductal carcinoma in situ, intermediate grade, with focal necrosis, measuring 2.1 cm (cylindrical clip), 1.3 cm (ribbon clip) and 1.6 cm (barbell clip)  ?- All resection margins are remote from DCIS  ?- No evidence of invasive carcinoma  ?- Benign unremarkable nipple  ?- Fibrocystic change  ?- Biopsy site changes  ?- See oncology table  ? ?B. BREAST, LEFT, MASTECTOMY:  ?- Ductal carcinoma in situ, intermediate grade, 1.2 cm (barbell clip)  ?- Resection margins are negative for DCIS  ?- No evidence of invasive carcinoma  ?- Benign unremarkable nipple  ?- Biopsy site changes  ?- See oncology table  ? ?C. LYMPH NODE, RIGHT AXILLARY #1, SENTINEL, EXCISION:  ?- Lymph node, negative for carcinoma (0/1)  ? ?D. LYMPH NODE, RIGHT AXILLARY, SENTINEL, EXCISION:  ?- Lymph node,  negative for  carcinoma (0/1)  ? ?E. LYMPH NODE, RIGHT AXILLARY, SENTINEL, EXCISION:  ?- Lymph node, negative for carcinoma (0/1)  ? ?F. LYMPH NODE, RIGHT AXILLARY, SENTINEL, EXCISION:  ?- Lymph node, negative for carcinoma (0/1)  ? ?G. LYMPH NODE, RIGHT AXILLARY, SENTINEL, EXCISION:  ?- Lymph node, negative for carcinoma (0/1)  ? ?H. LYMPH NODE, RIGHT AXILLARY #2, SENTINEL, EXCISION:  ?- Lymph node, negative for carcinoma (0/1)  ? ?I. LYMPH NODE, LEFT AXILLARY #1, SENTINEL, EXCISION:  ?- Lymph node, negative for carcinoma (0/1)  ? ?J. LYMPH NODE, LEFT AXILLARY, SENTINEL, EXCISION:  ?- Lymph node, negative for carcinoma (0/1)  ? ?K. LYMPH NODE, LEFT AXILLARY, SENTINEL, EXCISION:  ?- Lymph node, negative for carcinoma (0/1)  ? ?L. LYMPH NODE, LEFT AXILLARY, SENTINEL, EXCISION:  ?- Lymph node, negative for carcinoma (0/1)  ? ?M. LYMPH NODE, LEFT AXILLARY #2, SENTINEL, EXCISION:  ?- Lymph node, negative for carcinoma (0/1)  ? ?N. LYMPH NODE, LEFT AXILLARY #3, SENTINEL, EXCISION:  ?- Lymph node, negative for carcinoma (0/1)  ?  ? ? ?INTERVAL HISTORY:  ?Diana Tate presents to the Survivorship Clinic today for our initial meeting to review her survivorship care plan detailing her treatment course for breast cancer, as well as monitoring long-term side effects of that treatment, education regarding health maintenance, screening, and overall wellness and health promotion.    ? ?Overall, Diana Tate reports that she is doing well and has recovered from her bilateral mastectomy on 09/16/2021.  Her energy levels are back to baseline.  She is able to complete all her daily activities on her own.  She has a good appetite and is trying to eat a balanced diet.  She denies any nausea, vomiting or abdominal pain.  Her bowel habits are regular without any diarrhea or constipation.  Patient denies any fevers, chills, night sweats, shortness of breath, chest pain or cough.  She has no other complaints.  Rest of the 10 point ROS is below. ? ? ?REVIEW  OF SYSTEMS:  ?Review of Systems  ?Constitutional:  Negative for appetite change, fatigue and fever.  ?HENT:  Negative.    ?Eyes:  Negative for icterus.  ?Respiratory:  Negative for chest tightness, cough, hemoptysis, shortness of breath and wheezing.   ?Cardiovascular:  Negative for chest pain and leg swelling.  ?Gastrointestinal:  Negative for abdominal pain, blood in stool, constipation, diarrhea, nausea and vomiting.  ?Endocrine: Negative for hot flashes.  ?Genitourinary: Negative.    ?Musculoskeletal:  Negative for back pain.  ?Skin: Negative.   ?Neurological:  Negative for dizziness, headaches and numbness.  ?Breast: Denies any new nodularity, masses, tenderness, nipple changes, or nipple discharge.  ? ?ONCOLOGY TREATMENT TEAM:  ?1. Surgeon:  Dr. Autumn Messing at [facility] ?2. Plastic Surgeon: Dr. Lyndee Leo Dillingham ?3. Medical Oncologist: Dr. Nicholas Lose ? ?PAST MEDICAL/SURGICAL HISTORY:  ?Past Medical History:  ?Diagnosis Date  ? Cancer [facility])   ? GERD (gastroesophageal reflux disease)   ? Hyperlipidemia   ? Migraine   ? Classic  ? Sleep apnea   ? ?Past Surgical History:  ?Procedure Laterality Date  ? AXILLARY SENTINEL NODE BIOPSY Bilateral 09/16/2021  ? Procedure: BILATERAL AXILLARY SENTINEL NODE BIOPSY;  Surgeon: Jovita Kussmaul, MD;  Location: [facility];  Service: General;  Laterality: Bilateral;  ? BREAST BIOPSY Right 08/02/2021  ? 2 areas  ? BREAST BIOPSY Left 08/02/2021  ? BREAST EXCISIONAL BIOPSY Right 04/2012  ? BREAST RECONSTRUCTION WITH PLACEMENT OF TISSUE EXPANDER AND FLEX HD (ACELLULAR  HYDRATED DERMIS) Bilateral 09/16/2021  ? Procedure: BREAST RECONSTRUCTION WITH PLACEMENT OF TISSUE EXPANDER AND FLEX HD (ACELLULAR HYDRATED DERMIS);  Surgeon: Wallace Going, DO;  Location: [place];  Service: Plastics;  Laterality: Bilateral;  ? BREAST SURGERY    ? benign  ? CHOLECYSTECTOMY  2006  ? ENDOMETRIAL ABLATION  2007  ? ESOPHAGOGASTRODUODENOSCOPY N/A 05/22/2014  ? SLF: 1. Schatzki ring was found 2.  MIld non-erosive gastritis  ? TONSILLECTOMY  2000  ? TOTAL MASTECTOMY Bilateral 09/16/2021  ? Procedure: BILATERAL TOTAL MASTECTOMY;  Surgeon: Jovita Kussmaul, MD;  Location: [place];  Service: General;  Laterality: B

## 2021-11-29 ENCOUNTER — Ambulatory Visit: Payer: BC Managed Care – PPO | Admitting: Surgical

## 2021-11-29 ENCOUNTER — Ambulatory Visit (INDEPENDENT_AMBULATORY_CARE_PROVIDER_SITE_OTHER): Payer: BC Managed Care – PPO | Admitting: Surgical

## 2021-11-29 ENCOUNTER — Encounter: Payer: Self-pay | Admitting: Surgical

## 2021-11-29 VITALS — HR 75 | Ht 65.0 in | Wt 200.0 lb

## 2021-11-29 DIAGNOSIS — Z9013 Acquired absence of bilateral breasts and nipples: Secondary | ICD-10-CM

## 2021-11-29 DIAGNOSIS — C50811 Malignant neoplasm of overlapping sites of right female breast: Secondary | ICD-10-CM

## 2021-11-29 MED ORDER — CEPHALEXIN 500 MG PO CAPS: 500.0000 mg | ORAL_CAPSULE | Freq: Four times a day (QID) | ORAL | 0 refills | Status: AC

## 2021-11-29 MED ORDER — OXYCODONE HCL 5 MG PO TABS: 5.0000 mg | ORAL_TABLET | Freq: Four times a day (QID) | ORAL | 0 refills | Status: AC | PRN

## 2021-11-29 MED ORDER — ONDANSETRON HCL 4 MG PO TABS: 4.0000 mg | ORAL_TABLET | Freq: Three times a day (TID) | ORAL | 0 refills | Status: DC | PRN

## 2021-11-29 NOTE — H&P (View-Only) (Signed)
? ?  Patient ID: Diana Tate, female    DOB: 21-Oct-1970, 51 y.o.   MRN: [id number] ? ?Chief Complaint  ?Patient presents with  ? Pre-op Exam  ? ? ?  ICD-10-CM   ?1. Acquired absence of both breasts  Z90.13   ?  ?2. 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  ? ? ? ?History of Present Illness: ?Diana Tate is a 51 y.o.  female  with a history of bilateral breast reconstruction with current expanders in place.  She presents for preoperative evaluation for upcoming procedure, removal of bilateral breast tissue expanders and placement of bilateral breast implants, scheduled for 12/05/2021 with Dr. Marla Roe. ? ?The patient has not had problems with anesthesia. No history of DVT/PE.  No family history of DVT/PE.  No family or personal history of bleeding or clotting disorders.  Patient is not currently taking any blood thinners.  No history of CVA/MI.  ? ?Summary of Previous Visit: She currently has 540/535 in bilateral breast tissue expanders.  She is interested in additional fill.  Her goal is approximately a D cup ? ?Job: plan out of work 4/20 until 5/15. ? ?PMH Significant for: Breast cancer, sleep apnea, GERD.  Reports sleep apnea has been fine, she normally uses her machine but has been using a snore guard which has been very helpful. ? ?Patient is doing well.  She is ready for exchange.  No recent changes in her health.  Denies any cardiac or pulmonary history.  She would like an additional fill today. ? ?We filled bilateral tissue expanders with 30 cc of fluid for a total of 570 out of 535 in each expander.  She would like to be as big as she has now if not slightly larger but no smaller. ? ?Past Medical History: ?Allergies: ?Allergies  ?Allergen Reactions  ? Tape Dermatitis  ?  Patient states she has a problem with band aids but tape is ok ?  ? Lexapro [Escitalopram Oxalate] Nausea Only  ? ? ?Current  Medications: ? ?Current Outpatient Medications:  ?  cephALEXin (KEFLEX) 500 MG capsule, Take 1 capsule (500 mg total) by mouth 4 (four) times daily for 5 days., Disp: 20 capsule, Rfl: 0 ?  Cholecalciferol (VITAMIN D) 125 MCG (5000 UT) CAPS, Take 5,000 Units by mouth daily., Disp: , Rfl:  ?  Cyanocobalamin (VITAMIN B 12) 500 MCG TABS, Take 500 mcg by mouth daily., Disp: , Rfl:  ?  ondansetron (ZOFRAN) 4 MG tablet, Take 1 tablet (4 mg total) by mouth every 8 (eight) hours as needed for nausea or vomiting., Disp: 20 tablet, Rfl: 0 ?  oxyCODONE (OXY IR/ROXICODONE) 5 MG immediate release tablet, Take 1 tablet (5 mg total) by mouth every 6 (six) hours as needed for up to 5 days for severe pain., Disp: 20 tablet, Rfl: 0 ?  pantoprazole (PROTONIX) 20 MG tablet, Take 20 mg by mouth daily as needed for heartburn or indigestion., Disp: , Rfl:  ? ?Past Medical Problems: ?Past Medical History:  ?Diagnosis Date  ? Cancer [facility])   ? GERD (gastroesophageal reflux disease)   ? Hyperlipidemia   ? Migraine   ? Classic  ? Sleep apnea   ? ? ?Past Surgical History: ?Past Surgical History:  ?Procedure Laterality Date  ? AXILLARY SENTINEL NODE BIOPSY Bilateral 09/16/2021  ? Procedure: BILATERAL AXILLARY SENTINEL NODE BIOPSY;  Surgeon: Jovita Kussmaul, MD;  Location: [place];  Service: General;  Laterality: Bilateral;  ? BREAST BIOPSY Right 08/02/2021  ? 2 areas  ? BREAST BIOPSY Left 08/02/2021  ? BREAST EXCISIONAL BIOPSY Right 04/2012  ? BREAST RECONSTRUCTION WITH PLACEMENT OF TISSUE EXPANDER AND FLEX HD (ACELLULAR HYDRATED DERMIS) Bilateral 09/16/2021  ? Procedure: BREAST RECONSTRUCTION WITH PLACEMENT OF TISSUE EXPANDER AND FLEX HD (ACELLULAR HYDRATED DERMIS);  Surgeon: Wallace Going, DO;  Location: [place];  Service: Plastics;  Laterality: Bilateral;  ? BREAST SURGERY    ? benign  ? CHOLECYSTECTOMY  2006  ? ENDOMETRIAL ABLATION  2007  ? ESOPHAGOGASTRODUODENOSCOPY N/A 05/22/2014  ? SLF: 1. Schatzki ring was found 2. MIld non-erosive  gastritis  ? TONSILLECTOMY  2000  ? TOTAL MASTECTOMY Bilateral 09/16/2021  ? Procedure: BILATERAL TOTAL MASTECTOMY;  Surgeon: Jovita Kussmaul, MD;  Location: [place];  Service: General;  Laterality: Bilateral;  ? ? ?Social History: ?Social History  ? ?Socioeconomic History  ? Marital status: Married  ?  Spouse name: Not on file  ? Number of children: 2  ? Years of education: Not on file  ? Highest education level: Not on file  ?Occupational History  ? Occupation: Electrical engineer  ?  Employer: [employer]  ?Tobacco Use  ? Smoking status: Former  ?  Packs/day: 1.00  ?  Years: 24.00  ?  Pack years: 24.00  ?  Types: Cigarettes  ?  Quit date: 2017  ?  Years since quitting: 6.2  ? Smokeless tobacco: Never  ? Tobacco comments:  ?  Quit x 2 years  ?Vaping Use  ? Vaping Use: Never used  ?Substance and Sexual Activity  ? Alcohol use: No  ?  Alcohol/week: 0.0 standard drinks  ?  Comment: occ  ? Drug use: No  ? Sexual activity: Yes  ?Other Topics Concern  ? Not on file  ?Social History Narrative  ? Not on file  ? ?Social Determinants of Health  ? ?Financial Resource Strain: Not on file  ?Food Insecurity: Not on file  ?Transportation Needs: Not on file  ?Physical Activity: Not on file  ?Stress: Not on file  ?Social Connections: Not on file  ?Intimate Partner Violence: Not on file  ? ? ?Family History: ?Family History  ?Problem Relation Age of Onset  ? Hypertension Mother   ? Cancer Maternal Uncle   ?     lung  ? Cancer Maternal Grandfather   ?     skin  ? Diabetes Other   ?     second degree relative  ? Colon cancer Neg Hx   ? Esophageal cancer Neg Hx   ? ? ?Review of Systems: ?Review of Systems  ?Constitutional: Negative.   ?Respiratory: Negative.    ?Cardiovascular: Negative.   ?Gastrointestinal: Negative.   ?Neurological: Negative.   ? ?Physical Exam: ?Vital Signs ?Pulse 75   Ht '5\' 5"'$  (1.651 m)   Wt 200 lb (90.7 kg)   LMP 01/16/2009 (Approximate) Comment: Patient states her MD did bloodwork in October 2022 and  told patient she is postmenopausal. Patient also had an ablation. Last menstrual cycle was 13 years ago.  SpO2 97%   BMI 33.28 kg/m?  ? ?Physical Exam  ?Constitutional:   ?   General: Not in acute distress. ?   Appearance: Normal appearance. Not ill-appearing.  ?HENT:  ?   Head: Normocephalic and atraumatic.  ?Eyes:  ?   Pupils: Pupils are equal, round ?Neck:  ?   Musculoskeletal: Normal range of motion.  ?  Cardiovascular:  ?   Rate and Rhythm: Normal rate ?   Pulses: Normal pulses.  ?Pulmonary:  ?   Effort: Pulmonary effort is normal. No respiratory distress.  ?Breast: Bilateral breast incisions intact, healing well. ?Musculoskeletal: Normal range of motion.  ?Skin: ?   General: Skin is warm and dry.  ?   Findings: No erythema or rash.  ?Neurological:  ?   General: No focal deficit present.  ?   Mental Status: Alert and oriented to person, place, and time. Mental status is at baseline.  ?   Motor: No weakness.  ?Psychiatric:     ?   Mood and Affect: Mood normal.     ?   Behavior: Behavior normal.  ? ? ?Assessment/Plan: ?The patient is scheduled for exchange of bilateral breast tissue expanders for bilateral silicone breast implants with Dr. Marla Roe.  Risks, benefits, and alternatives of procedure discussed, questions answered and consent obtained.   ? ?Smoking Status: Non-smoker; Counseling Given?  N/A ? ?Caprini Score: 6, high; Risk Factors include: Age, BMI rater than 25, history of breast cancer and length of planned surgery. Recommendation for mechanical  prophylaxis. Encourage early ambulation.  ? ?Pictures obtained: Pictures taken at patient's previous visit ? ?Post-op Rx sent to pharmacy:  Oxycodone, Zofran, Keflex ? ?Patient was provided with the General Surgical Risk consent document and Pain Medication Agreement prior to their appointment.  They had adequate time to read through the risk consent documents and Pain Medication Agreement. We also discussed them in person together during this preop  appointment. All of their questions were answered to their satisfaction.  Recommended calling if they have any further questions.  Risk consent form and Pain Medication Agreement to be scanned into patient's chart. ? ?Patient was provi

## 2021-11-29 NOTE — Progress Notes (Signed)
? ?  Patient ID: Diana Tate, female    DOB: 01/28/1971, 51 y.o.   MRN: [id number] ? ?Chief Complaint  ?Patient presents with  ? Pre-op Exam  ? ? ?  ICD-10-CM   ?1. Acquired absence of both breasts  Z90.13   ?  ?2. 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  ? ? ? ?History of Present Illness: ?Diana Tate is a 51 y.o.  female  with a history of bilateral breast reconstruction with current expanders in place.  She presents for preoperative evaluation for upcoming procedure, removal of bilateral breast tissue expanders and placement of bilateral breast implants, scheduled for 12/05/2021 with Dr. Marla Roe. ? ?The patient has not had problems with anesthesia. No history of DVT/PE.  No family history of DVT/PE.  No family or personal history of bleeding or clotting disorders.  Patient is not currently taking any blood thinners.  No history of CVA/MI.  ? ?Summary of Previous Visit: She currently has 540/535 in bilateral breast tissue expanders.  She is interested in additional fill.  Her goal is approximately a D cup ? ?Job: plan out of work 4/20 until 5/15. ? ?PMH Significant for: Breast cancer, sleep apnea, GERD.  Reports sleep apnea has been fine, she normally uses her machine but has been using a snore guard which has been very helpful. ? ?Patient is doing well.  She is ready for exchange.  No recent changes in her health.  Denies any cardiac or pulmonary history.  She would like an additional fill today. ? ?We filled bilateral tissue expanders with 30 cc of fluid for a total of 570 out of 535 in each expander.  She would like to be as big as she has now if not slightly larger but no smaller. ? ?Past Medical History: ?Allergies: ?Allergies  ?Allergen Reactions  ? Tape Dermatitis  ?  Patient states she has a problem with band aids but tape is ok ?  ? Lexapro [Escitalopram Oxalate] Nausea Only  ? ? ?Current  Medications: ? ?Current Outpatient Medications:  ?  cephALEXin (KEFLEX) 500 MG capsule, Take 1 capsule (500 mg total) by mouth 4 (four) times daily for 5 days., Disp: 20 capsule, Rfl: 0 ?  Cholecalciferol (VITAMIN D) 125 MCG (5000 UT) CAPS, Take 5,000 Units by mouth daily., Disp: , Rfl:  ?  Cyanocobalamin (VITAMIN B 12) 500 MCG TABS, Take 500 mcg by mouth daily., Disp: , Rfl:  ?  ondansetron (ZOFRAN) 4 MG tablet, Take 1 tablet (4 mg total) by mouth every 8 (eight) hours as needed for nausea or vomiting., Disp: 20 tablet, Rfl: 0 ?  oxyCODONE (OXY IR/ROXICODONE) 5 MG immediate release tablet, Take 1 tablet (5 mg total) by mouth every 6 (six) hours as needed for up to 5 days for severe pain., Disp: 20 tablet, Rfl: 0 ?  pantoprazole (PROTONIX) 20 MG tablet, Take 20 mg by mouth daily as needed for heartburn or indigestion., Disp: , Rfl:  ? ?Past Medical Problems: ?Past Medical History:  ?Diagnosis Date  ? Cancer [facility])   ? GERD (gastroesophageal reflux disease)   ? Hyperlipidemia   ? Migraine   ? Classic  ? Sleep apnea   ? ? ?Past Surgical History: ?Past Surgical History:  ?Procedure Laterality Date  ? AXILLARY SENTINEL NODE BIOPSY Bilateral 09/16/2021  ? Procedure: BILATERAL AXILLARY SENTINEL NODE BIOPSY;  Surgeon: Jovita Kussmaul, MD;  Location: [place];  Service: General;  Laterality: Bilateral;  ? BREAST BIOPSY Right 08/02/2021  ? 2 areas  ? BREAST BIOPSY Left 08/02/2021  ? BREAST EXCISIONAL BIOPSY Right 04/2012  ? BREAST RECONSTRUCTION WITH PLACEMENT OF TISSUE EXPANDER AND FLEX HD (ACELLULAR HYDRATED DERMIS) Bilateral 09/16/2021  ? Procedure: BREAST RECONSTRUCTION WITH PLACEMENT OF TISSUE EXPANDER AND FLEX HD (ACELLULAR HYDRATED DERMIS);  Surgeon: Wallace Going, DO;  Location: [place];  Service: Plastics;  Laterality: Bilateral;  ? BREAST SURGERY    ? benign  ? CHOLECYSTECTOMY  2006  ? ENDOMETRIAL ABLATION  2007  ? ESOPHAGOGASTRODUODENOSCOPY N/A 05/22/2014  ? SLF: 1. Schatzki ring was found 2. MIld non-erosive  gastritis  ? TONSILLECTOMY  2000  ? TOTAL MASTECTOMY Bilateral 09/16/2021  ? Procedure: BILATERAL TOTAL MASTECTOMY;  Surgeon: Jovita Kussmaul, MD;  Location: [place];  Service: General;  Laterality: Bilateral;  ? ? ?Social History: ?Social History  ? ?Socioeconomic History  ? Marital status: Married  ?  Spouse name: Not on file  ? Number of children: 2  ? Years of education: Not on file  ? Highest education level: Not on file  ?Occupational History  ? Occupation: Electrical engineer  ?  Employer: [employer]  ?Tobacco Use  ? Smoking status: Former  ?  Packs/day: 1.00  ?  Years: 24.00  ?  Pack years: 24.00  ?  Types: Cigarettes  ?  Quit date: 2017  ?  Years since quitting: 6.2  ? Smokeless tobacco: Never  ? Tobacco comments:  ?  Quit x 2 years  ?Vaping Use  ? Vaping Use: Never used  ?Substance and Sexual Activity  ? Alcohol use: No  ?  Alcohol/week: 0.0 standard drinks  ?  Comment: occ  ? Drug use: No  ? Sexual activity: Yes  ?Other Topics Concern  ? Not on file  ?Social History Narrative  ? Not on file  ? ?Social Determinants of Health  ? ?Financial Resource Strain: Not on file  ?Food Insecurity: Not on file  ?Transportation Needs: Not on file  ?Physical Activity: Not on file  ?Stress: Not on file  ?Social Connections: Not on file  ?Intimate Partner Violence: Not on file  ? ? ?Family History: ?Family History  ?Problem Relation Age of Onset  ? Hypertension Mother   ? Cancer Maternal Uncle   ?     lung  ? Cancer Maternal Grandfather   ?     skin  ? Diabetes Other   ?     second degree relative  ? Colon cancer Neg Hx   ? Esophageal cancer Neg Hx   ? ? ?Review of Systems: ?Review of Systems  ?Constitutional: Negative.   ?Respiratory: Negative.    ?Cardiovascular: Negative.   ?Gastrointestinal: Negative.   ?Neurological: Negative.   ? ?Physical Exam: ?Vital Signs ?Pulse 75   Ht '5\' 5"'$  (1.651 m)   Wt 200 lb (90.7 kg)   LMP 01/16/2009 (Approximate) Comment: Patient states her MD did bloodwork in October 2022 and  told patient she is postmenopausal. Patient also had an ablation. Last menstrual cycle was 13 years ago.  SpO2 97%   BMI 33.28 kg/m?  ? ?Physical Exam  ?Constitutional:   ?   General: Not in acute distress. ?   Appearance: Normal appearance. Not ill-appearing.  ?HENT:  ?   Head: Normocephalic and atraumatic.  ?Eyes:  ?   Pupils: Pupils are equal, round ?Neck:  ?   Musculoskeletal: Normal range of motion.  ?  Cardiovascular:  ?   Rate and Rhythm: Normal rate ?   Pulses: Normal pulses.  ?Pulmonary:  ?   Effort: Pulmonary effort is normal. No respiratory distress.  ?Breast: Bilateral breast incisions intact, healing well. ?Musculoskeletal: Normal range of motion.  ?Skin: ?   General: Skin is warm and dry.  ?   Findings: No erythema or rash.  ?Neurological:  ?   General: No focal deficit present.  ?   Mental Status: Alert and oriented to person, place, and time. Mental status is at baseline.  ?   Motor: No weakness.  ?Psychiatric:     ?   Mood and Affect: Mood normal.     ?   Behavior: Behavior normal.  ? ? ?Assessment/Plan: ?The patient is scheduled for exchange of bilateral breast tissue expanders for bilateral silicone breast implants with Dr. Marla Roe.  Risks, benefits, and alternatives of procedure discussed, questions answered and consent obtained.   ? ?Smoking Status: Non-smoker; Counseling Given?  N/A ? ?Caprini Score: 6, high; Risk Factors include: Age, BMI rater than 25, history of breast cancer and length of planned surgery. Recommendation for mechanical  prophylaxis. Encourage early ambulation.  ? ?Pictures obtained: Pictures taken at patient's previous visit ? ?Post-op Rx sent to pharmacy:  Oxycodone, Zofran, Keflex ? ?Patient was provided with the General Surgical Risk consent document and Pain Medication Agreement prior to their appointment.  They had adequate time to read through the risk consent documents and Pain Medication Agreement. We also discussed them in person together during this preop  appointment. All of their questions were answered to their satisfaction.  Recommended calling if they have any further questions.  Risk consent form and Pain Medication Agreement to be scanned into patient's chart. ? ?Patient was provi

## 2021-12-02 ENCOUNTER — Ambulatory Visit: Payer: BC Managed Care – PPO | Admitting: Physical Therapy

## 2021-12-02 ENCOUNTER — Encounter: Payer: Self-pay | Admitting: Physical Therapy

## 2021-12-02 DIAGNOSIS — M25612 Stiffness of left shoulder, not elsewhere classified: Secondary | ICD-10-CM | POA: Diagnosis not present

## 2021-12-02 DIAGNOSIS — M25611 Stiffness of right shoulder, not elsewhere classified: Secondary | ICD-10-CM

## 2021-12-02 DIAGNOSIS — R600 Localized edema: Secondary | ICD-10-CM

## 2021-12-02 DIAGNOSIS — D0511 Intraductal carcinoma in situ of right breast: Secondary | ICD-10-CM

## 2021-12-02 DIAGNOSIS — D0512 Intraductal carcinoma in situ of left breast: Secondary | ICD-10-CM

## 2021-12-02 DIAGNOSIS — R293 Abnormal posture: Secondary | ICD-10-CM

## 2021-12-02 NOTE — Therapy (Signed)
[facility] ?[facility] ?[place], [place], [address] ?Phone: [phone]   Fax:  [phone] ? ?Physical Therapy Treatment ? ?Patient Details  ?Name: Diana Tate ?MRN: [id number] ?Date of Birth: [birth year] ?Referring Provider (PT): Dr. Marlou Starks ? ? ?Encounter Date: 12/02/2021 ? ? PT End of Session - 12/02/21 1452   ? ? Visit Number 15   ? Number of Visits 18   ? Date for PT Re-Evaluation 12/04/21   ? PT Start Time 8453   ? PT Stop Time 1451   ? PT Time Calculation (min) 57 min   ? Activity Tolerance Patient tolerated treatment well   ? Behavior During Therapy [facility] for tasks assessed/performed   ? ?  ?  ? ?  ? ? ?Past Medical History:  ?Diagnosis Date  ? Cancer [facility])   ? GERD (gastroesophageal reflux disease)   ? Hyperlipidemia   ? Migraine   ? Classic  ? Sleep apnea   ? ? ?Past Surgical History:  ?Procedure Laterality Date  ? AXILLARY SENTINEL NODE BIOPSY Bilateral 09/16/2021  ? Procedure: BILATERAL AXILLARY SENTINEL NODE BIOPSY;  Surgeon: Jovita Kussmaul, MD;  Location: [place];  Service: General;  Laterality: Bilateral;  ? BREAST BIOPSY Right 08/02/2021  ? 2 areas  ? BREAST BIOPSY Left 08/02/2021  ? BREAST EXCISIONAL BIOPSY Right 04/2012  ? BREAST RECONSTRUCTION WITH PLACEMENT OF TISSUE EXPANDER AND FLEX HD (ACELLULAR HYDRATED DERMIS) Bilateral 09/16/2021  ? Procedure: BREAST RECONSTRUCTION WITH PLACEMENT OF TISSUE EXPANDER AND FLEX HD (ACELLULAR HYDRATED DERMIS);  Surgeon: Wallace Going, DO;  Location: [place];  Service: Plastics;  Laterality: Bilateral;  ? BREAST SURGERY    ? benign  ? CHOLECYSTECTOMY  2006  ? ENDOMETRIAL ABLATION  2007  ? ESOPHAGOGASTRODUODENOSCOPY N/A 05/22/2014  ? SLF: 1. Schatzki ring was found 2. MIld non-erosive gastritis  ? TONSILLECTOMY  2000  ? TOTAL MASTECTOMY Bilateral 09/16/2021  ? Procedure: BILATERAL TOTAL MASTECTOMY;  Surgeon: Jovita Kussmaul, MD;  Location: [place];  Service: General;  Laterality: Bilateral;  ? ? ?There were  no vitals filed for this visit. ? ? Subjective Assessment - 12/02/21 1452   ? ? Subjective I had one last fill. My surgery is on the 20th.   ? 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 Patient Stated Goals to gain info from providers   ? Currently in Pain? No/denies   ? Pain Score 0-No pain   ? ?  ?  ? ?  ? ? ? ? ? ? ? ? ? L-DEX FLOWSHEETS - 12/02/21 1400   ? ?  ? L-DEX LYMPHEDEMA SCREENING  ? Measurement Type Bilateral   ? L-DEX MEASUREMENT EXTREMITY Upper Extremity   ? DOMINANT SIDE Left   ? At Risk Side Left   ? BASELINE SCORE (UNILATERAL) -0.9   ? L-DEX SCORE (UNILATERAL) -2.9   ? VALUE CHANGE (UNILAT) -2   ? BASELINE RIGHT -0.1   ? BASELINE LEFT -0.9   ? RIGHT SIDE SCORE -2.4   ? LEFT SIDE SCORE -2.9   ? VALUE CHANGE RIGHT -2.3   ? VALUE CHANGE LEFT -2   ? ?  ?  ? ?  ? ? ? ? ? ? ? ? ? ? ? ? [facility] Adult PT Treatment/Exercise - 12/02/21 0001   ? ?  ? Shoulder Exercises: Pulleys  ? Flexion 2  minutes   ? ABduction 2 minutes   ?  ? Shoulder Exercises: Therapy Ball  ? Flexion Both;10 reps   with stretch at end range  ? ABduction Both;10 reps   with stretch at end range  ?  ? Manual Therapy  ? Soft tissue mobilization to bilateral pecs where pt has increased tightness   ? Myofascial Release to scar tissue at drain sites and across bilateral pecs   ? Passive ROM to bilateral shoulders in to flex and abduction, and ER   ? ?  ?  ? ?  ? ? ? ? ? ? ? ? ? ? ? ? ? ? ? PT Long Term Goals - 12/02/21 1455   ? ?  ? PT LONG TERM GOAL #1  ? Title Pt will return to baseline shoulder ROM and not demonstrate any signs or symptoms of lymphedema.   ? Time 6   ? Period Weeks   ? Status Achieved   ?  ? PT LONG TERM GOAL #2  ? Title Pt will report a 50% improvement in feelings of tightness across bilateral chest just inferior to breasts   ? Time 4   ? Period Weeks   ? Status Achieved   ?  ? PT LONG TERM GOAL #3  ? Title Pt will demonstrate 160  degrees of L shoulder flexion to allow her to reach overhead.   ? Baseline 150; 11/06/21- 166   ? Time 4   ? Period Weeks   ? Status Achieved   ?  ? PT LONG TERM GOAL #4  ? Title Pt will have decreased spasms in upper abdomen and decreased tightness in bilateral axilla after expander fills to allow improved comfort.   ? Baseline 12/02/21- pain in upper abdoment has improved   ? Time 4   ? Period Weeks   ? Status Achieved   ?  ? PT LONG TERM GOAL #5  ? Title Pt will be independent in a home exercise program for continued stretching and strengthening.   ? Time 4   ? Period Weeks   ? Status Achieved   ? ?  ?  ? ?  ? ? ? ? ? ? ? ? Plan - 12/02/21 1453   ? ? Clinical Impression Statement Pt has her surgery on 12/05/21. She had another fill at her last visit. She reports she has had an increase in pain and tightness since her last fill. Continued with AAROM and PROM to bilateral shoulders. Also spent increased time on soft tissue mobilization and myofascial release to bilateral pecs to decrease tightness. Will leave plan of care open until after pt's surgery in case she has any difficulty with ROM or tightness.   ? PT Frequency 2x / week   ? PT Duration 4 weeks   ? PT Treatment/Interventions ADLs/Self Care Home Management;Patient/family education;Therapeutic exercise;Manual techniques;Manual lymph drainage;Scar mobilization;Passive range of motion   ? PT Next Visit Plan how is pt post surgery?, talk to pt about [employer] - no coverage for garments, do PROM to bilat sh, pulleys, ball, MLD R lateral trunk and axilla, manual therapy to upper abdomen to decrease spasms   ? PT Home Exercise Plan post op breast exercises, supine scap   ? Consulted and Agree with Plan of Care Patient   ? ?  ?  ? ?  ? ? ?Patient will benefit from skilled therapeutic intervention in order to improve the following deficits and impairments:  Postural dysfunction, Decreased knowledge  of precautions, Increased fascial restricitons, Decreased range of  motion, Decreased scar mobility ? ?Visit Diagnosis: ?Stiffness of left shoulder, not elsewhere classified ? ?Stiffness of right shoulder, not elsewhere classified ? ?Localized edema ? ?Abnormal posture ? ?Ductal carcinoma in situ (DCIS) of right breast ? ?Ductal carcinoma in situ (DCIS) of left breast ? ? ? ? ?Problem List ?Patient Active Problem List  ? Diagnosis Date Noted  ? Acquired absence of breast 09/27/2021  ? Breast cancer ([place]) 09/16/2021  ? 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 Former smoker 12/07/2020  ? History of COVID-19 11/21/2019  ? Obstructive sleep apnea 02/20/2016  ? Insomnia 12/03/2014  ? Alternating constipation and diarrhea 05/16/2014  ? Abdominal pain, epigastric 05/16/2014  ? Esophageal reflux 12/12/2013  ? Migraine headache with aura 03/30/2013  ? 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 ? ?Diana Tate, PT ?12/02/2021, 2:56 PM ? ?[place] ?[facility] [place] [facility] ?[place], [place], [address] ?Phone: [phone]   Fax:  [phone] ? ?Name: Diana Tate ?MRN: [id number] ?Date of Birth: 1970-12-02 ? ? ? ?

## 2021-12-05 ENCOUNTER — Other Ambulatory Visit: Payer: Self-pay

## 2021-12-05 ENCOUNTER — Ambulatory Visit (HOSPITAL_BASED_OUTPATIENT_CLINIC_OR_DEPARTMENT_OTHER)
Admission: RE | Admit: 2021-12-05 | Discharge: 2021-12-05 | Disposition: A | Discharge status: Home / Self Care | Payer: BC Managed Care – PPO | Attending: Plastic Surgery | Admitting: Plastic Surgery

## 2021-12-05 ENCOUNTER — Encounter (HOSPITAL_BASED_OUTPATIENT_CLINIC_OR_DEPARTMENT_OTHER): Payer: Self-pay | Admitting: Plastic Surgery

## 2021-12-05 ENCOUNTER — Encounter (HOSPITAL_BASED_OUTPATIENT_CLINIC_OR_DEPARTMENT_OTHER)
Admission: RE | Disposition: A | Discharge status: Home / Self Care | Payer: Self-pay | Source: Home / Self Care | Attending: Plastic Surgery

## 2021-12-05 ENCOUNTER — Ambulatory Visit (HOSPITAL_BASED_OUTPATIENT_CLINIC_OR_DEPARTMENT_OTHER): Payer: BC Managed Care – PPO | Admitting: Anesthesiology

## 2021-12-05 DIAGNOSIS — Z9013 Acquired absence of bilateral breasts and nipples: Secondary | ICD-10-CM | POA: Insufficient documentation

## 2021-12-05 DIAGNOSIS — Z6833 Body mass index (BMI) 33.0-33.9, adult: Secondary | ICD-10-CM | POA: Insufficient documentation

## 2021-12-05 DIAGNOSIS — K219 Gastro-esophageal reflux disease without esophagitis: Secondary | ICD-10-CM | POA: Insufficient documentation

## 2021-12-05 DIAGNOSIS — Z87891 Personal history of nicotine dependence: Secondary | ICD-10-CM | POA: Insufficient documentation

## 2021-12-05 DIAGNOSIS — G4730 Sleep apnea, unspecified: Secondary | ICD-10-CM | POA: Insufficient documentation

## 2021-12-05 DIAGNOSIS — E669 Obesity, unspecified: Secondary | ICD-10-CM | POA: Insufficient documentation

## 2021-12-05 DIAGNOSIS — Z853 Personal history of malignant neoplasm of breast: Secondary | ICD-10-CM | POA: Diagnosis not present

## 2021-12-05 HISTORY — PX: REMOVAL OF BILATERAL TISSUE EXPANDERS WITH PLACEMENT OF BILATERAL BREAST IMPLANTS: SHX6431

## 2021-12-05 SURGERY — REMOVAL, TISSUE EXPANDER, BREAST, BILATERAL, WITH BILATERAL IMPLANT IMPLANT INSERTION
Anesthesia: General | Site: Breast | Laterality: Bilateral

## 2021-12-05 MED ORDER — SODIUM CHLORIDE 0.9 % IV SOLN
INTRAVENOUS | Status: DC | PRN
  Administered 2021-12-05: 500 mL

## 2021-12-05 MED ORDER — MIDAZOLAM HCL 2 MG/2ML IJ SOLN
INTRAMUSCULAR | Status: AC
Start: 2021-12-05 — End: ?
  Filled 2021-12-05: qty 2

## 2021-12-05 MED ORDER — CEFAZOLIN SODIUM-DEXTROSE 2-4 GM/100ML-% IV SOLN
INTRAVENOUS | Status: AC
  Filled 2021-12-05: qty 100

## 2021-12-05 MED ORDER — FENTANYL CITRATE (PF) 100 MCG/2ML IJ SOLN
25.0000 ug | INTRAMUSCULAR | Status: DC | PRN
  Administered 2021-12-05 (×2): 50 ug via INTRAVENOUS

## 2021-12-05 MED ORDER — FENTANYL CITRATE (PF) 100 MCG/2ML IJ SOLN
INTRAMUSCULAR | Status: DC | PRN
  Administered 2021-12-05: 100 ug via INTRAVENOUS

## 2021-12-05 MED ORDER — ACETAMINOPHEN 500 MG PO TABS
1000.0000 mg | ORAL_TABLET | Freq: Once | ORAL | Status: AC
Start: 2021-12-05 — End: 2021-12-05
  Administered 2021-12-05: 1000 mg via ORAL

## 2021-12-05 MED ORDER — LACTATED RINGERS IV SOLN: INTRAVENOUS | Status: DC

## 2021-12-05 MED ORDER — OXYCODONE HCL 5 MG PO TABS: 5.0000 mg | ORAL_TABLET | ORAL | Status: DC | PRN

## 2021-12-05 MED ORDER — FENTANYL CITRATE (PF) 100 MCG/2ML IJ SOLN
INTRAMUSCULAR | Status: AC
  Filled 2021-12-05: qty 2

## 2021-12-05 MED ORDER — ONDANSETRON HCL 4 MG/2ML IJ SOLN
INTRAMUSCULAR | Status: DC | PRN
  Administered 2021-12-05: 4 mg via INTRAVENOUS

## 2021-12-05 MED ORDER — SODIUM CHLORIDE 0.9% FLUSH: 3.0000 mL | INTRAVENOUS | Status: DC | PRN

## 2021-12-05 MED ORDER — SODIUM CHLORIDE 0.9 % IV SOLN
INTRAVENOUS | Status: AC
  Filled 2021-12-05: qty 10

## 2021-12-05 MED ORDER — CEFAZOLIN SODIUM-DEXTROSE 2-4 GM/100ML-% IV SOLN
2.0000 g | INTRAVENOUS | Status: AC
  Administered 2021-12-05: 2 g via INTRAVENOUS

## 2021-12-05 MED ORDER — MIDAZOLAM HCL 5 MG/5ML IJ SOLN
INTRAMUSCULAR | Status: DC | PRN
  Administered 2021-12-05: 2 mg via INTRAVENOUS

## 2021-12-05 MED ORDER — FENTANYL CITRATE (PF) 100 MCG/2ML IJ SOLN: 25.0000 ug | INTRAMUSCULAR | Status: DC | PRN

## 2021-12-05 MED ORDER — SODIUM CHLORIDE 0.9% FLUSH: 3.0000 mL | Freq: Two times a day (BID) | INTRAVENOUS | Status: DC

## 2021-12-05 MED ORDER — ACETAMINOPHEN 500 MG PO TABS
ORAL_TABLET | ORAL | Status: AC
  Filled 2021-12-05: qty 2

## 2021-12-05 MED ORDER — SODIUM CHLORIDE 0.9 % IV SOLN: 250.0000 mL | INTRAVENOUS | Status: DC | PRN

## 2021-12-05 MED ORDER — ACETAMINOPHEN 325 MG RE SUPP: 650.0000 mg | RECTAL | Status: DC | PRN

## 2021-12-05 MED ORDER — PROPOFOL 10 MG/ML IV BOLUS
INTRAVENOUS | Status: DC | PRN
  Administered 2021-12-05: 160 mg via INTRAVENOUS

## 2021-12-05 MED ORDER — ACETAMINOPHEN 325 MG PO TABS: 650.0000 mg | ORAL_TABLET | ORAL | Status: DC | PRN

## 2021-12-05 MED ORDER — SCOPOLAMINE 1 MG/3DAYS TD PT72: 1.0000 | MEDICATED_PATCH | Freq: Once | TRANSDERMAL | Status: DC

## 2021-12-05 MED ORDER — ONDANSETRON HCL 4 MG/2ML IJ SOLN: 4.0000 mg | Freq: Once | INTRAMUSCULAR | Status: DC | PRN

## 2021-12-05 MED ORDER — LIDOCAINE-EPINEPHRINE 1 %-1:100000 IJ SOLN
INTRAMUSCULAR | Status: DC | PRN
  Administered 2021-12-05: 5 mL

## 2021-12-05 MED ORDER — ROCURONIUM BROMIDE 100 MG/10ML IV SOLN
INTRAVENOUS | Status: DC | PRN
  Administered 2021-12-05: 60 mg via INTRAVENOUS

## 2021-12-05 MED ORDER — DEXAMETHASONE SODIUM PHOSPHATE 4 MG/ML IJ SOLN
INTRAMUSCULAR | Status: DC | PRN
  Administered 2021-12-05: 10 mg via INTRAVENOUS

## 2021-12-05 MED ORDER — LIDOCAINE HCL (CARDIAC) PF 100 MG/5ML IV SOSY
PREFILLED_SYRINGE | INTRAVENOUS | Status: DC | PRN
  Administered 2021-12-05: 80 mg via INTRAVENOUS

## 2021-12-05 MED ORDER — SUGAMMADEX SODIUM 200 MG/2ML IV SOLN
INTRAVENOUS | Status: DC | PRN
  Administered 2021-12-05: 200 mg via INTRAVENOUS

## 2021-12-05 MED ORDER — CHLORHEXIDINE GLUCONATE CLOTH 2 % EX PADS: 6.0000 | MEDICATED_PAD | Freq: Once | CUTANEOUS | Status: DC

## 2021-12-05 SURGICAL SUPPLY — 72 items
ADH SKN CLS APL DERMABOND .7 (GAUZE/BANDAGES/DRESSINGS) ×2
BAG DECANTER FOR FLEXI CONT (MISCELLANEOUS) ×3 IMPLANT
BINDER BREAST LRG (GAUZE/BANDAGES/DRESSINGS) IMPLANT
BINDER BREAST MEDIUM (GAUZE/BANDAGES/DRESSINGS) IMPLANT
BINDER BREAST XLRG (GAUZE/BANDAGES/DRESSINGS) ×1 IMPLANT
BINDER BREAST XXLRG (GAUZE/BANDAGES/DRESSINGS) IMPLANT
BIOPATCH RED 1 DISK 7.0 (GAUZE/BANDAGES/DRESSINGS) IMPLANT
BLADE HEX COATED 2.75 (ELECTRODE) ×2 IMPLANT
BLADE SURG 15 STRL LF DISP TIS (BLADE) ×4 IMPLANT
BLADE SURG 15 STRL SS (BLADE) ×4
CANISTER SUCT 1200ML W/VALVE (MISCELLANEOUS) ×3 IMPLANT
COVER BACK TABLE 60X90IN (DRAPES) ×3 IMPLANT
COVER MAYO STAND STRL (DRAPES) ×3 IMPLANT
DERMABOND ADVANCED (GAUZE/BANDAGES/DRESSINGS) ×2
DERMABOND ADVANCED .7 DNX12 (GAUZE/BANDAGES/DRESSINGS) IMPLANT
DRAIN CHANNEL 19F RND (DRAIN) IMPLANT
DRAPE LAPAROSCOPIC ABDOMINAL (DRAPES) ×3 IMPLANT
DRSG OPSITE POSTOP 4X6 (GAUZE/BANDAGES/DRESSINGS) ×6 IMPLANT
DRSG PAD ABDOMINAL 8X10 ST (GAUZE/BANDAGES/DRESSINGS) ×6 IMPLANT
ELECT BLADE 4.0 EZ CLEAN MEGAD (MISCELLANEOUS) ×2
ELECT NDL BLADE 2-5/6 (NEEDLE) IMPLANT
ELECT NEEDLE BLADE 2-5/6 (NEEDLE) ×2 IMPLANT
ELECT REM PT RETURN 9FT ADLT (ELECTROSURGICAL) ×2
ELECTRODE BLDE 4.0 EZ CLN MEGD (MISCELLANEOUS) ×2 IMPLANT
ELECTRODE REM PT RTRN 9FT ADLT (ELECTROSURGICAL) ×2 IMPLANT
EVACUATOR SILICONE 100CC (DRAIN) IMPLANT
FUNNEL KELLER 2 DISP (MISCELLANEOUS) ×1 IMPLANT
GAUZE SPONGE 4X4 12PLY STRL LF (GAUZE/BANDAGES/DRESSINGS) IMPLANT
GLOVE BIO SURGEON STRL SZ 6.5 (GLOVE) ×9 IMPLANT
GLOVE BIO SURGEON STRL SZ7 (GLOVE) ×2 IMPLANT
GOWN STRL REUS W/ TWL LRG LVL3 (GOWN DISPOSABLE) ×4 IMPLANT
GOWN STRL REUS W/TWL LRG LVL3 (GOWN DISPOSABLE) ×8
IMPL BREAST P6.5XULT HI 650 (Breast) IMPLANT
IMPL BRST P6.5XULT HI 650CC (Breast) ×2 IMPLANT
IMPLANT BREAST GEL 650CC (Breast) ×4 IMPLANT
IV NS 1000ML (IV SOLUTION)
IV NS 1000ML BAXH (IV SOLUTION) IMPLANT
IV NS 500ML (IV SOLUTION)
IV NS 500ML BAXH (IV SOLUTION) IMPLANT
KIT FILL ASEPTIC TRANSFER (MISCELLANEOUS) IMPLANT
NDL HYPO 25X1 1.5 SAFETY (NEEDLE) ×2 IMPLANT
NDL SAFETY ECLIPSE 18X1.5 (NEEDLE) ×2 IMPLANT
NEEDLE HYPO 18GX1.5 SHARP (NEEDLE) ×2
NEEDLE HYPO 25X1 1.5 SAFETY (NEEDLE) ×2 IMPLANT
PACK BASIN DAY SURGERY FS (CUSTOM PROCEDURE TRAY) ×3 IMPLANT
PENCIL SMOKE EVACUATOR (MISCELLANEOUS) ×3 IMPLANT
PIN SAFETY STERILE (MISCELLANEOUS) IMPLANT
SIZER BREAST REUSE 590CC (SIZER) ×2
SIZER BREAST REUSE 650CC (SIZER) ×2
SIZER BRST REUSE 590CC (SIZER) IMPLANT
SIZER BRST REUSE P6.4 650CC (SIZER) IMPLANT
SLEEVE SCD COMPRESS KNEE MED (STOCKING) ×3 IMPLANT
SPIKE FLUID TRANSFER (MISCELLANEOUS) IMPLANT
SPONGE T-LAP 18X18 ~~LOC~~+RFID (SPONGE) ×6 IMPLANT
STRIP SUTURE WOUND CLOSURE 1/2 (MISCELLANEOUS) ×2 IMPLANT
SUT MNCRL AB 4-0 PS2 18 (SUTURE) ×6 IMPLANT
SUT MON AB 3-0 SH 27 (SUTURE) ×4
SUT MON AB 3-0 SH27 (SUTURE) ×8 IMPLANT
SUT MON AB 5-0 PS2 18 (SUTURE) ×3 IMPLANT
SUT PDS 3-0 CT2 (SUTURE) ×8
SUT PDS AB 2-0 CT2 27 (SUTURE) IMPLANT
SUT PDS II 3-0 CT2 27 ABS (SUTURE) ×4 IMPLANT
SUT VIC AB 3-0 SH 27 (SUTURE)
SUT VIC AB 3-0 SH 27X BRD (SUTURE) IMPLANT
SUT VICRYL 4-0 PS2 18IN ABS (SUTURE) IMPLANT
SYR BULB IRRIG 60ML STRL (SYRINGE) ×3 IMPLANT
SYR CONTROL 10ML LL (SYRINGE) ×3 IMPLANT
TOWEL GREEN STERILE FF (TOWEL DISPOSABLE) ×6 IMPLANT
TRAY DSU PREP LF (CUSTOM PROCEDURE TRAY) ×3 IMPLANT
TUBE CONNECTING 20X1/4 (TUBING) ×3 IMPLANT
UNDERPAD 30X36 HEAVY ABSORB (UNDERPADS AND DIAPERS) ×6 IMPLANT
YANKAUER SUCT BULB TIP NO VENT (SUCTIONS) ×3 IMPLANT

## 2021-12-05 NOTE — Transfer of Care (Signed)
Immediate Anesthesia Transfer of Care Note ? ?Patient: Diana Tate ? ?Procedure(s) Performed: REMOVAL OF BILATERAL TISSUE EXPANDERS WITH PLACEMENT OF BILATERAL BREAST IMPLANTS (Bilateral: Breast) ? ?Patient Location: PACU ? ?Anesthesia Type:General ? ?Level of Consciousness: awake, alert , oriented, drowsy and patient cooperative ? ?Airway & Oxygen Therapy: Patient Spontanous Breathing and Patient connected to face mask oxygen ? ?Post-op Assessment: Report given to RN and Post -op Vital signs reviewed and stable ? ?Post vital signs: Reviewed and stable ? ?Last Vitals:  ?Vitals Value Taken Time  ?BP 142/70 12/05/21 1446  ?Temp    ?Pulse 87 12/05/21 1447  ?Resp 17 12/05/21 1447  ?SpO2 97 % 12/05/21 1447  ?Vitals shown include unvalidated device data. ? ?Last Pain:  ?Vitals:  ? 12/05/21 1133  ?TempSrc: Oral  ?PainSc: 0-No pain  ?   ? ?Patients Stated Pain Goal: 3 (12/05/21 1133) ? ?Complications: No notable events documented. ?

## 2021-12-05 NOTE — Anesthesia Procedure Notes (Signed)
Procedure Name: Intubation ?Date/Time: 12/05/2021 1:18 PM ?Performed by: Santa Lighter, MD ?Pre-anesthesia Checklist: Patient identified, Emergency Drugs available, Suction available and Patient being monitored ?Patient Re-evaluated:Patient Re-evaluated prior to induction ?Oxygen Delivery Method: Circle system utilized ?Preoxygenation: Pre-oxygenation with 100% oxygen ?Induction Type: IV induction ?Ventilation: Mask ventilation without difficulty ?Laryngoscope Size: Mac, 3 and Glidescope (MAC 3 x2, Glidescope 4 x1) ?Grade View: Grade III ?Tube type: Oral ?Tube size: 7.0 mm ?Number of attempts: 3 ?Airway Equipment and Method: Stylet and Oral airway ?Placement Confirmation: ETT inserted through vocal cords under direct vision, positive ETCO2 and breath sounds checked- equal and bilateral ?Tube secured with: Tape ?Dental Injury: Teeth and Oropharynx as per pre-operative assessment  ?Difficulty Due To: Difficulty was unanticipated and Difficult Airway- due to anterior larynx ?Future Recommendations: Recommend- induction with short-acting agent, and alternative techniques readily available ?Comments: Attempt x1 by CRNA with esophageal intubation with MAC 3. Attempt x1 by MDA with Grade 4 view, unable to pass ETT.  Glidescope 4 with Grade 3 view by MDA, successful placement of ETT. +BBS, +EtCO2. ? ? ? ? ?

## 2021-12-05 NOTE — Op Note (Signed)
Op report Bilateral Exchange  ? ?DATE OF OPERATION: 12/05/2021 ? ?LOCATION: [facility] ? ?SURGICAL DIVISION: Plastic Surgery ? ?PREOPERATIVE DIAGNOSIS:  ?History of breast cancer.  ?2. Acquired absence of bilateral breast.  ? ?POSTOPERATIVE DIAGNOSIS:  ?1. History of breast cancer.  ?2. Acquired absence of bilateral breast.  ? ?PROCEDURE:  ?1. Bilateral exchange of tissue expanders for implants.  ?2. Bilateral capsulotomies for implant respositioning. ? ?SURGEON: Joannah Gitlin Sanger Makayla Confer, DO ? ?ASSISTANT: Roetta Sessions, PA ? ?ANESTHESIA:  General.  ? ?COMPLICATIONS: None.  ? ?IMPLANTS: ?Left - Mentor Smooth Round Ultra High Profile Gel 650cc. Ref #[id number].  Serial Number [id number] ?Right - Mentor Smooth Round Ultra High Profile Gel 650cc. Ref #[id number].  Serial Number [id number] ? ?INDICATIONS FOR PROCEDURE:  ?The patient, Diana Tate, is a 51 y.o. female born on [birth year], is here for treatment after bilateral mastectomies.  She had tissue expanders placed at the time of mastectomies. She now presents for exchange of her expanders for implants.  She requires capsulotomies to better position the implants. ?MRN: [id number] ? ?CONSENT:  ?Informed consent was obtained directly from the patient. Risks, benefits and alternatives were fully discussed. Specific risks including but not limited to bleeding, infection, hematoma, seroma, scarring, pain, implant infection, implant extrusion, capsular contracture, asymmetry, wound healing problems, and need for further surgery were all discussed. The patient did have an ample opportunity to have her questions answered to her satisfaction.  ? ?DESCRIPTION OF PROCEDURE:  ?The patient was taken to the operating room. SCDs were placed and IV antibiotics were given. The patient's chest was prepped and draped in a sterile fashion. A time out was performed and the implants to be used were identified.   ? ?On the right breast: One percent Lidocaine with  epinephrine was used to infiltrate at the incision site. The old mastectomy scar was incised.  The mastectomy flaps from the superior and inferior flaps were raised over the pectoralis major muscle. The juncture between the pectoralis and the ADM was split to expose and remove the tissue expander.  Inspection of the pocket showed a normal healthy capsule. The ADM was not integrated but looked healthy.  The pocket was irrigated with antibiotic solution.  ? ?Superior capsulotomies were performed to allow for breast pocket expansion.  Measurements were made and a sizer used to confirm adequate pocket size for the implant dimensions.  Hemostasis was ensured with electrocautery. New gloves were placed. The implant was soaked in antibiotic solution and then placed in the pocket and oriented appropriately. The pectoralis major muscle and ADM were closed with a 3-0 PDS suture. The skin was closed with 3-0 Monocryl and 4-0 Monocryl.  ? ?On the left breast: One percent Lidocaine with epinephrine was used to infiltrate at the incision site. The old mastectomy scar was incised.  The mastectomy flaps from the superior and inferior flaps were raised over the pectoralis major muscle. The juncture between the pectoralis and the ADM was split to expose and remove the tissue expander.  Inspection of the pocket showed a normal healthy capsule. The ADM was well integrated.  The pocket was irrigated with antibiotic solution.  ? ?Superior capsulotomies were performed to allow for breast pocket expansion.  Measurements were made and a sizer used to confirm adequate pocket size for the implant dimensions.  Hemostasis was ensured with electrocautery. New gloves were placed. The implant was soaked in antibiotic solution and then placed in the pocket and oriented appropriately. The pectoralis major muscle and  ADM were closed with a 3-0 PDS suture. The skin was closed with 3-0 Monocryl and 4-0 Monocryl.  Dermabond was applied to the incision  site. A breast binder and ABDs were placed.  The patient was allowed to wake from anesthesia and taken to the recovery room in satisfactory condition.  ? ?The advanced practice practitioner (APP) assisted throughout the case.  The APP was essential in retraction and counter traction when needed to make the case progress smoothly.  This retraction and assistance made it possible to see the tissue plans for the procedure.  The assistance was needed for blood control, tissue re-approximation and assisted with closure of the incision site. ? ? ?

## 2021-12-05 NOTE — Interval H&P Note (Signed)
History and Physical Interval Note: ? ?12/05/2021 ?12:10 PM ? ?Diana Tate  has presented today for surgery, with the diagnosis of Acquired absence of both breasts.  The various methods of treatment have been discussed with the patient and family. After consideration of risks, benefits and other options for treatment, the patient has consented to  Procedure(s) with comments: ?REMOVAL OF BILATERAL TISSUE EXPANDERS WITH PLACEMENT OF BILATERAL BREAST IMPLANTS (Bilateral) - 2 hours as a surgical intervention.  The patient's history has been reviewed, patient examined, no change in status, stable for surgery.  I have reviewed the patient's chart and labs.  Questions were answered to the patient's satisfaction.   ? ? ?Diana Tate ? ? ?

## 2021-12-05 NOTE — Discharge Instructions (Addendum)
INSTRUCTIONS FOR AFTER BREAST SURGERY ?Tylenol 1000 mg given at 11:35 a.m. ? ?You will likely have some questions about what to expect following your operation.  The following information will help you and your family understand what to expect when you are discharged from the hospital.  Following these guidelines will help ensure a smooth recovery and reduce risks of complications.  Postoperative instructions include information on: diet, wound care, medications and physical activity. ? ?AFTER SURGERY ?Expect to go home after the procedure.  In some cases, you may need to spend one night in the hospital for observation. ? ?DIET ?Breast surgery does not require a specific diet.  However, the healthier you eat the better your body can start healing. It is important to increasing your protein intake.  This means limiting the foods with sugar and carbohydrates.  Focus on vegetables and some meat.  If you have any liposuction during your procedure be sure to drink water.  If your urine is bright yellow, then it is concentrated, and you need to drink more water.  As a general rule after surgery, you should have 8 ounces of water every hour while awake.  If you find you are persistently nauseated or unable to take in liquids let us know.  NO TOBACCO USE or EXPOSURE.  This will slow your healing process and increase the risk of a wound. ? ?WOUND CARE ?Leave the ACE wrap or binder on for 3 days . Use fragrance free soap.   ?After 3 days you can remove the ACE wrap or binder to shower. Once dry apply ACE wrap, binder or sports bra.  Use a mild soap like Dial, Dove and [place]. ?You may have Topifoam or Lipofoam on.  It is soft and spongy and helps keep you from getting creases if you have liposuction.  This can be removed before the shower and then replaced.  If you need more it is available on [employer] (Lipofoam). ?If you have steri-strips / tape directly attached to your skin leave them in place. It is OK to get these wet.    ?No baths, pools or hot tubs for four weeks. ?We close your incision to leave the smallest and best-looking scar. No ointment or creams on your incisions until given the go ahead.  Especially not Neosporin (Too many skin reactions with this one).  A few weeks after surgery you can use Mederma and start massaging the scar. ?We ask you to wear your binder or sports bra for the first 6 weeks around the clock, including while sleeping. This provides added comfort and helps reduce the fluid accumulation at the surgery site. ? ?ACTIVITY ?No heavy lifting until cleared by the doctor.  This usually means no more than a half-gallon of milk.  It is OK to walk and climb stairs. In fact, moving your legs is very important to decrease your risk of a blood clot.  It will also help keep you from getting deconditioned.  Every 1 to 2 hours get up and walk for 5 minutes. This will help with a quicker recovery back to normal.  Let pain be your guide so you don't do too much.  This is not the time for spring cleaning and don't plan on taking care of anyone else.  This time is for you to recover,  ?You will be more comfortable if you sleep and rest with your head elevated either with a few pillows under you or in a recliner.  No stomach sleeping for  a three months. ? ?WORK ?Everyone returns to work at different times. As a rough guide, most people take at least 1 - 2 weeks off prior to returning to work. If you need documentation for your job, bring the forms to your postoperative follow up visit. ? ?DRIVING ?Arrange for someone to bring you home from the hospital.  You may be able to drive a few days after surgery but not while taking any narcotics or valium. ? ?BOWEL MOVEMENTS ?Constipation can occur after anesthesia and while taking pain medication.  It is important to stay ahead for your comfort.  We recommend taking Milk of Magnesia (2 tablespoons; twice a day) while taking the pain pills. ? ?MEDICATIONS You may be prescribed  should start after surgery ?At your preoperative visit for you history and physical you may have been given the following medications: ?An antibiotic: Start this medication when you get home and take according to the instructions on the bottle. ?Zofran 4 mg:  This is to treat nausea and vomiting.  You can take this every 6 hours as needed and only if needed. ?Valium 2 mg: This is for muscle tightness if you have an implant or expander. This will help relax your muscle which also helps with pain control.  This can be taken every 12 hours as needed. Don't drive after taking this medication. ?Norco (hydrocodone/acetaminophen) 5/325 mg:  This is only to be used after you have taken the motrin or the tylenol. Every 8 hours as needed. ? ? ?Over the counter Medication to take: ?Ibuprofen (Motrin) 600 mg:  Take this every 6 hours.  If you have additional pain then take 500 mg of the tylenol every 8 hours.  Only take the Norco after you have tried these two. ?Miralax or stool softener of choice: Take this according to the bottle if you take the Norco. ? ?WHEN TO CALL ?Call your surgeon's office if any of the following occur: ?Fever 101 degrees F or greater ?Excessive bleeding or fluid from the incision site. ?Pain that increases over time without aid from the medications ?Redness, warmth, or pus draining from incision sites ?Persistent nausea or inability to take in liquids ?Severe misshapen area that underwent the operation. ? ?Post Anesthesia Home Care Instructions ? ?Activity: ?Get plenty of rest for the remainder of the day. A responsible individual must stay with you for 24 hours following the procedure.  ?For the next 24 hours, DO NOT: ?-Drive a car ?-Paediatric nurse ?-Drink alcoholic beverages ?-Take any medication unless instructed by your physician ?-Make any legal decisions or sign important papers. ? ?Meals: ?Start with liquid foods such as gelatin or soup. Progress to regular foods as tolerated. Avoid greasy,  spicy, heavy foods. If nausea and/or vomiting occur, drink only clear liquids until the nausea and/or vomiting subsides. Call your physician if vomiting continues. ? ?Special Instructions/Symptoms: ?Your throat may feel dry or sore from the anesthesia or the breathing tube placed in your throat during surgery. If this causes discomfort, gargle with warm salt water. The discomfort should disappear within 24 hours. ? ?If you had a scopolamine patch placed behind your ear for the management of post- operative nausea and/or vomiting: ? ?1. The medication in the patch is effective for 72 hours, after which it should be removed.  Wrap patch in a tissue and discard in the trash. Wash hands thoroughly with soap and water. ?2. You may remove the patch earlier than 72 hours if you experience unpleasant side effects which  may include dry mouth, dizziness or visual disturbances. ?3. Avoid touching the patch. Wash your hands with soap and water after contact with the patch. ?    ?

## 2021-12-05 NOTE — Anesthesia Preprocedure Evaluation (Addendum)
Anesthesia Evaluation  ?Patient identified by MRN, date of birth, ID band ?Patient awake ? ? ? ?Reviewed: ?Allergy & Precautions, NPO status , Patient's Chart, lab work & pertinent test results ? ?Airway ?Mallampati: II ? ?TM Distance: <3 FB ?Neck ROM: Full ? ? ? Dental ? ?(+) Teeth Intact, Dental Advisory Given, Caps,  ?  ?Pulmonary ?sleep apnea and Continuous Positive Airway Pressure Ventilation , former smoker,  ?  ?Pulmonary exam normal ?breath sounds clear to auscultation ? ? ? ? ? ? Cardiovascular ?negative cardio ROS ?Normal cardiovascular exam ?Rhythm:Regular Rate:Normal ? ? ?  ?Neuro/Psych ? Headaches, negative psych ROS  ? GI/Hepatic ?Neg liver ROS, GERD  Medicated,  ?Endo/Other  ?Obesity ? ? Renal/GU ?negative Renal ROS  ? ?  ?Musculoskeletal ?negative musculoskeletal ROS ?(+)  ? Abdominal ?  ?Peds ? Hematology ?negative hematology ROS ?(+)   ?Anesthesia Other Findings ?Day of surgery medications reviewed with the patient. ? ?H/o breast cancer with acquired absence of both breasts ? Reproductive/Obstetrics ? ?  ? ? ? ? ? ? ? ? ? ? ? ? ? ?  ?  ? ? ? ? ? ? ? ?Anesthesia Physical ?Anesthesia Plan ? ?ASA: 2 ? ?Anesthesia Plan: General  ? ?Post-op Pain Management: Tylenol PO (pre-op)* and Toradol IV (intra-op)*  ? ?Induction: Intravenous ? ?PONV Risk Score and Plan: 3 and Midazolam, Dexamethasone and Ondansetron ? ?Airway Management Planned: Oral ETT ? ?Additional Equipment:  ? ?Intra-op Plan:  ? ?Post-operative Plan: Extubation in OR ? ?Informed Consent: I have reviewed the patients History and Physical, chart, labs and discussed the procedure including the risks, benefits and alternatives for the proposed anesthesia with the patient or authorized representative who has indicated his/her understanding and acceptance.  ? ? ? ?Dental advisory given ? ?Plan Discussed with: CRNA ? ?Anesthesia Plan Comments:   ? ? ? ? ? ?Anesthesia Quick Evaluation ? ?

## 2021-12-06 ENCOUNTER — Encounter (HOSPITAL_BASED_OUTPATIENT_CLINIC_OR_DEPARTMENT_OTHER): Payer: Self-pay | Admitting: Plastic Surgery

## 2021-12-06 NOTE — Anesthesia Postprocedure Evaluation (Addendum)
Anesthesia Post Note ? ?Patient: Diana Tate ? ?Procedure(s) Performed: REMOVAL OF BILATERAL TISSUE EXPANDERS WITH PLACEMENT OF BILATERAL BREAST IMPLANTS (Bilateral: Breast) ? ?  ? ?Patient location during evaluation: PACU ?Anesthesia Type: General ?Level of consciousness: awake and alert ?Pain management: pain level controlled ?Vital Signs Assessment: post-procedure vital signs reviewed and stable ?Respiratory status: spontaneous breathing, nonlabored ventilation, respiratory function stable and patient connected to nasal cannula oxygen ?Cardiovascular status: blood pressure returned to baseline and stable ?Postop Assessment: no apparent nausea or vomiting ?Anesthetic complications: no ? ? ?No notable events documented. ? ?Last Vitals:  ?Vitals:  ? 12/05/21 1545 12/05/21 1612  ?BP: 136/73 (!) 119/96  ?Pulse: 74 86  ?Resp: 10 14  ?Temp:  (!) 36.4 ?C  ?SpO2: 93% 98%  ?  ?Last Pain:  ?Vitals:  ? 12/05/21 1603  ?TempSrc:   ?PainSc: 0-No pain  ? ? ?  ?  ?  ?  ?  ?  ? ?Santa Lighter ? ? ? ? ?

## 2021-12-09 ENCOUNTER — Telehealth: Payer: Self-pay | Admitting: Surgical

## 2021-12-09 ENCOUNTER — Ambulatory Visit: Payer: BC Managed Care – PPO

## 2021-12-09 NOTE — Telephone Encounter (Signed)
Pt is calling in stating that she had a procedure on Thursday for some implants and the size is nothing like she expected and would like to have a call back to see what can be done about.  Pt would like to have a call back. ? ?

## 2021-12-10 NOTE — Telephone Encounter (Signed)
Called pt to discuss, we discussed allowing time for the implants to settle and muscles to relax. Recommend using valium PRN. We will further discuss and evaluate at her initial post-op follow up ?

## 2021-12-16 ENCOUNTER — Encounter: Payer: BC Managed Care – PPO | Admitting: Physician Assistant

## 2021-12-18 ENCOUNTER — Ambulatory Visit (INDEPENDENT_AMBULATORY_CARE_PROVIDER_SITE_OTHER): Payer: BC Managed Care – PPO | Admitting: Surgical

## 2021-12-18 ENCOUNTER — Encounter: Payer: Self-pay | Admitting: Surgical

## 2021-12-18 DIAGNOSIS — Z9013 Acquired absence of bilateral breasts and nipples: Secondary | ICD-10-CM

## 2021-12-18 DIAGNOSIS — D0511 Intraductal carcinoma in situ of right breast: Secondary | ICD-10-CM

## 2021-12-18 DIAGNOSIS — C50811 Malignant neoplasm of overlapping sites of right female breast: Secondary | ICD-10-CM

## 2021-12-18 MED ORDER — CYCLOBENZAPRINE HCL 10 MG PO TABS: 10.0000 mg | ORAL_TABLET | Freq: Every day | ORAL | 0 refills | Status: DC

## 2021-12-18 NOTE — Progress Notes (Signed)
Patient is a 51 year old female here for follow-up after removal of bilateral breast tissue expanders and placement of bilateral silicone breast implants on 12/05/2021 with Dr. Marla Roe.  She is 2 weeks postop. ? ?She had Mentor smooth round ultrahigh profile gel 650 cc implants placed bilaterally.  Preoperatively she had 570/535 cc in each expander.  She reports she is healing well, she is bothered by the size of her bilateral reconstructed breasts.  She feels as if they are much smaller than she expected.  She reports the implants feel much smaller than the expanders were. ? ?She has some questions about ways to improve size and shape. ? ?Chaperone present on exam ?On exam bilateral breast incisions intact, Steri-Strips in place.  No erythema or cellulitic changes noted.  No significant swelling is noted.  No bruising noted.  No foul odor is noted. ? ?Plan: ?Continue with compressive garments, avoid strenuous activities. ?We discussed that as the muscle relaxes over the next few weeks to months, she may notice some improvement in the size and contour of her bilateral breast.  We discussed use of muscle relaxers to help relax the pectoralis muscle.  ? ?We discussed options moving forward such as fat grafting to bilateral breast for improved contour and size.  We discussed that the largest possible implant was placed intraoperatively which was a 650 cc gel implant.  The capsule was very tight, no larger implant was able to be placed. ? ?Recommend following up in 2 weeks for reevaluation, call with questions or concerns. ?

## 2021-12-31 ENCOUNTER — Ambulatory Visit (INDEPENDENT_AMBULATORY_CARE_PROVIDER_SITE_OTHER): Payer: BC Managed Care – PPO | Admitting: Student

## 2021-12-31 DIAGNOSIS — D0511 Intraductal carcinoma in situ of right breast: Secondary | ICD-10-CM

## 2021-12-31 DIAGNOSIS — Z9882 Breast implant status: Secondary | ICD-10-CM

## 2021-12-31 DIAGNOSIS — Z9889 Other specified postprocedural states: Secondary | ICD-10-CM

## 2021-12-31 NOTE — Progress Notes (Signed)
Patient is a 51 year old female with history of breast cancer and bilateral breast reconstruction.  Patient underwent bilateral exchange of tissue expanders for implants and bilateral capsulotomies for implant repositioning on 12/05/2021 with Dr. Marla Roe.  650 cc Mentor smooth round ultrahigh profile gel implants were placed bilaterally. ? ?Patient was seen in the clinic on 12/18/2021 for postop follow-up.  At this appointment, she felt that the implants were smaller than she expected. ? ?Today, the patient presents to the clinic for reevaluation and postop follow-up.  Overall she reports she is doing really well and has not had any issues.  She denies fevers, chills, drainage, or swelling.  Patient does report that she is still smaller than she expected.  She states that she feels her implants are small and too far apart.  Patient states that she is interested in fat grafting.  Patient also reports she has mild intermittent dull pain in her axilla bilaterally. ? ?Chaperone present on exam.  On exam, incisions are intact and healing well.  No erythema, drainage or bruising is noted.  Breasts are soft and symmetrical.  No significant swelling noted.  No fluid collections noted.  Small suture knot was removed on the right breast. ? ?Discussed with patient to continue compressive sports bra and to continue to restrict lifting heavy items greater than 10 to 15 pounds.  Also discussed with patient that physical therapy is an option for her for the intermittent dull ache she has in her axilla bilaterally.  Patient stated that she would like to pursue this.  PT referral has been ordered. ? ?Also discussed with patient that we can reevaluate her in late July or August for possible fat grafting at that time for breast contour.  Patient expressed strong interest in this option. ? ?Patient to follow-up in July or August. ? ?Patient seen and examined with Dr. Marla Roe. ? ?

## 2022-01-22 ENCOUNTER — Encounter: Payer: Self-pay | Admitting: Physical Therapy

## 2022-01-22 ENCOUNTER — Ambulatory Visit: Payer: BC Managed Care – PPO | Attending: General Surgery | Admitting: Physical Therapy

## 2022-01-22 DIAGNOSIS — Z9889 Other specified postprocedural states: Secondary | ICD-10-CM | POA: Diagnosis present

## 2022-01-22 DIAGNOSIS — M25612 Stiffness of left shoulder, not elsewhere classified: Secondary | ICD-10-CM | POA: Insufficient documentation

## 2022-01-22 DIAGNOSIS — M25611 Stiffness of right shoulder, not elsewhere classified: Secondary | ICD-10-CM | POA: Insufficient documentation

## 2022-01-22 DIAGNOSIS — R293 Abnormal posture: Secondary | ICD-10-CM | POA: Diagnosis present

## 2022-01-22 DIAGNOSIS — D0512 Intraductal carcinoma in situ of left breast: Secondary | ICD-10-CM | POA: Insufficient documentation

## 2022-01-22 DIAGNOSIS — D0511 Intraductal carcinoma in situ of right breast: Secondary | ICD-10-CM | POA: Insufficient documentation

## 2022-01-22 DIAGNOSIS — R600 Localized edema: Secondary | ICD-10-CM | POA: Diagnosis present

## 2022-01-22 NOTE — Therapy (Signed)
[facility] @ [facility] [place], [place], [address] Phone: [phone]   Fax:  [phone]  Physical Therapy Treatment  Patient Details  Name: Diana Tate MRN: [id number] Date of Birth: 1971/06/01 Referring Provider (PT): Dr. Marlou Starks   Encounter Date: 01/22/2022   PT End of Session - 01/22/22 1600     Visit Number 16    Number of Visits 18    Date for PT Re-Evaluation 12/04/21    PT Start Time 1558    PT Stop Time 1644    PT Time Calculation (min) 46 min    Activity Tolerance Patient tolerated treatment well    Behavior During Therapy [facility] for tasks assessed/performed             Past Medical History:  Diagnosis Date   Cancer ([place])    GERD (gastroesophageal reflux disease)    Hyperlipidemia    Migraine    Classic   Sleep apnea     Past Surgical History:  Procedure Laterality Date   AXILLARY SENTINEL NODE BIOPSY Bilateral 09/16/2021   Procedure: BILATERAL AXILLARY SENTINEL NODE BIOPSY;  Surgeon: Jovita Kussmaul, MD;  Location: [facility];  Service: General;  Laterality: Bilateral;   BREAST BIOPSY Right 08/02/2021   2 areas   BREAST BIOPSY Left 08/02/2021   BREAST EXCISIONAL BIOPSY Right 04/2012   BREAST RECONSTRUCTION WITH PLACEMENT OF TISSUE EXPANDER AND FLEX HD (ACELLULAR HYDRATED DERMIS) Bilateral 09/16/2021   Procedure: BREAST RECONSTRUCTION WITH PLACEMENT OF TISSUE EXPANDER AND FLEX HD (ACELLULAR HYDRATED DERMIS);  Surgeon: Wallace Going, DO;  Location: [facility];  Service: Plastics;  Laterality: Bilateral;   BREAST SURGERY     benign   CHOLECYSTECTOMY  2006   ENDOMETRIAL ABLATION  2007   ESOPHAGOGASTRODUODENOSCOPY N/A 05/22/2014   SLF: 1. Schatzki ring was found 2. MIld non-erosive gastritis   REMOVAL OF BILATERAL TISSUE EXPANDERS WITH PLACEMENT OF BILATERAL BREAST IMPLANTS Bilateral 12/05/2021   Procedure: REMOVAL OF BILATERAL TISSUE EXPANDERS WITH PLACEMENT OF BILATERAL BREAST IMPLANTS;  Surgeon:  Wallace Going, DO;  Location: [facility];  Service: Plastics;  Laterality: Bilateral;  2 hours   TONSILLECTOMY  2000   TOTAL MASTECTOMY Bilateral 09/16/2021   Procedure: BILATERAL TOTAL MASTECTOMY;  Surgeon: Jovita Kussmaul, MD;  Location: [facility];  Service: General;  Laterality: Bilateral;    There were no vitals filed for this visit.   Subjective Assessment - 01/22/22 1651     Subjective I had the surgery. I am not really having any issues. I am not having pain anymore.    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 No/denies    Pain Score 0-No pain                [facility] PT Assessment - 01/22/22 0001       Observation/Other Assessments   Observations well healed scars bilaterally post implant exchange surgery      AROM   Right Shoulder Flexion 165 Degrees    Right Shoulder ABduction 172 Degrees    Left Shoulder Flexion 171 Degrees    Left Shoulder ABduction 179 Degrees               LYMPHEDEMA/ONCOLOGY QUESTIONNAIRE - 01/22/22 0001       Right Upper Extremity Lymphedema  15 cm Proximal to Olecranon Process 35.3 cm    Olecranon Process 28.5 cm    15 cm Proximal to Ulnar Styloid Process 27 cm    Just Proximal to Ulnar Styloid Process 17.2 cm    Across Hand at [employer] 20 cm    At [place] of 2nd Digit 6 cm      Left Upper Extremity Lymphedema   15 cm Proximal to Olecranon Process 34.2 cm    Olecranon Process 28 cm    15 cm Proximal to Ulnar Styloid Process 27.5 cm    Just Proximal to Ulnar Styloid Process 17.8 cm    Across Hand at [employer] 20.5 cm    At [place] of 2nd Digit 6.3 cm                                      PT Long Term Goals - 12/02/21 1455       PT LONG TERM GOAL #1   Title Pt will return to baseline shoulder ROM and not  demonstrate any signs or symptoms of lymphedema.    Time 6    Period Weeks    Status Achieved      PT LONG TERM GOAL #2   Title Pt will report a 50% improvement in feelings of tightness across bilateral chest just inferior to breasts    Time 4    Period Weeks    Status Achieved      PT LONG TERM GOAL #3   Title Pt will demonstrate 160 degrees of L shoulder flexion to allow her to reach overhead.    Baseline 150; 11/06/21- 166    Time 4    Period Weeks    Status Achieved      PT LONG TERM GOAL #4   Title Pt will have decreased spasms in upper abdomen and decreased tightness in bilateral axilla after expander fills to allow improved comfort.    Baseline 12/02/21- pain in upper abdoment has improved    Time 4    Period Weeks    Status Achieved      PT LONG TERM GOAL #5   Title Pt will be independent in a home exercise program for continued stretching and strengthening.    Time 4    Period Weeks    Status Achieved                   Plan - 01/22/22 1645     Clinical Impression Statement Pt returns to PT after undergoing her implant exchange surgery on 12/05/21. Reassessed ROM and her shoulder ROM is better than it was at her last visit and is [facility]. She is not having any pain and does not demonstrate any swelling. Scar are soft to touch with no increased scar tissue. Pt will be discharged from skilled PT services at this time but will continue with ldex screenings every 3 months for the first 2 years post surgery.    PT Frequency 2x / week    PT Duration 4 weeks    PT Treatment/Interventions ADLs/Self Care Home Management;Patient/family education;Therapeutic exercise;Manual techniques;Manual lymph drainage;Scar mobilization;Passive range of motion    PT Next Visit Plan d/c pt at this time, continue l dex screenings every 3 months for the first 2 years post op    PT Home Exercise Plan post op breast exercises, supine scap    Consulted  and Agree with Plan of Care Patient              Patient will benefit from skilled therapeutic intervention in order to improve the following deficits and impairments:  Postural dysfunction, Decreased knowledge of precautions, Increased fascial restricitons, Decreased range of motion, Decreased scar mobility  Visit Diagnosis: Stiffness of left shoulder, not elsewhere classified  Stiffness of right shoulder, not elsewhere classified  Localized edema  Abnormal posture  Ductal carcinoma in situ (DCIS) of right breast  Ductal carcinoma in situ (DCIS) of left breast     Problem List Patient Active Problem List   Diagnosis Date Noted   Acquired absence of breast 09/27/2021   Breast cancer ([place]) 09/16/2021   Ductal carcinoma in situ (DCIS) of right breast 08/09/2021   Former smoker 12/07/2020   History of COVID-19 11/21/2019   Obstructive sleep apnea 02/20/2016   Insomnia 12/03/2014   Alternating constipation and diarrhea 05/16/2014   Abdominal pain, epigastric 05/16/2014   Esophageal reflux 12/12/2013   Migraine headache with aura 03/30/2013   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 Patient goals were met. Patient is being discharged due to meeting the stated rehab goals.  Allyson Sabal [place], [place] 01/22/22 4:52 PM

## 2022-03-04 ENCOUNTER — Encounter: Payer: Self-pay | Admitting: Physical Therapy

## 2022-03-04 ENCOUNTER — Ambulatory Visit: Payer: BC Managed Care – PPO | Attending: General Surgery | Admitting: Physical Therapy

## 2022-03-04 ENCOUNTER — Ambulatory Visit: Payer: BC Managed Care – PPO | Admitting: Plastic Surgery

## 2022-03-04 DIAGNOSIS — D0511 Intraductal carcinoma in situ of right breast: Secondary | ICD-10-CM | POA: Insufficient documentation

## 2022-03-04 DIAGNOSIS — D0512 Intraductal carcinoma in situ of left breast: Secondary | ICD-10-CM | POA: Insufficient documentation

## 2022-03-04 DIAGNOSIS — R293 Abnormal posture: Secondary | ICD-10-CM | POA: Insufficient documentation

## 2022-03-04 NOTE — Therapy (Signed)
OUTPATIENT PHYSICAL THERAPY SOZO SCREENING NOTE   Patient Name: Diana Tate MRN: [id number] DOB:[birth year], 51 y.o., female Today's Date: 03/04/2022  PCP: Tomasa Hose, NP REFERRING PROVIDER: Jovita Kussmaul, MD   PT End of Session - 03/04/22 1600     Visit Number 16   unchanged due to SOZO   PT Start Time 1545    PT Stop Time 1600    PT Time Calculation (min) 15 min    Activity Tolerance Patient tolerated treatment well    Behavior During Therapy WFL for tasks assessed/performed             Past Medical History:  Diagnosis Date   Cancer ([place])    GERD (gastroesophageal reflux disease)    Hyperlipidemia    Migraine    Classic   Sleep apnea    Past Surgical History:  Procedure Laterality Date   AXILLARY SENTINEL NODE BIOPSY Bilateral 09/16/2021   Procedure: BILATERAL AXILLARY SENTINEL NODE BIOPSY;  Surgeon: Jovita Kussmaul, MD;  Location: [facility];  Service: General;  Laterality: Bilateral;   BREAST BIOPSY Right 08/02/2021   2 areas   BREAST BIOPSY Left 08/02/2021   BREAST EXCISIONAL BIOPSY Right 04/2012   BREAST RECONSTRUCTION WITH PLACEMENT OF TISSUE EXPANDER AND FLEX HD (ACELLULAR HYDRATED DERMIS) Bilateral 09/16/2021   Procedure: BREAST RECONSTRUCTION WITH PLACEMENT OF TISSUE EXPANDER AND FLEX HD (ACELLULAR HYDRATED DERMIS);  Surgeon: Wallace Going, DO;  Location: [facility];  Service: Plastics;  Laterality: Bilateral;   BREAST SURGERY     benign   CHOLECYSTECTOMY  2006   ENDOMETRIAL ABLATION  2007   ESOPHAGOGASTRODUODENOSCOPY N/A 05/22/2014   SLF: 1. Schatzki ring was found 2. MIld non-erosive gastritis   REMOVAL OF BILATERAL TISSUE EXPANDERS WITH PLACEMENT OF BILATERAL BREAST IMPLANTS Bilateral 12/05/2021   Procedure: REMOVAL OF BILATERAL TISSUE EXPANDERS WITH PLACEMENT OF BILATERAL BREAST IMPLANTS;  Surgeon: Wallace Going, DO;  Location: [facility];  Service: Plastics;  Laterality: Bilateral;  2 hours   TONSILLECTOMY  2000    TOTAL MASTECTOMY Bilateral 09/16/2021   Procedure: BILATERAL TOTAL MASTECTOMY;  Surgeon: Jovita Kussmaul, MD;  Location: [facility];  Service: General;  Laterality: Bilateral;   Patient Active Problem List   Diagnosis Date Noted   Acquired absence of breast 09/27/2021   Breast cancer ([place]) 09/16/2021   Ductal carcinoma in situ (DCIS) of right breast 08/09/2021   Former smoker 12/07/2020   History of COVID-19 11/21/2019   Obstructive sleep apnea 02/20/2016   Insomnia 12/03/2014   Alternating constipation and diarrhea 05/16/2014   Abdominal pain, epigastric 05/16/2014   Esophageal reflux 12/12/2013   Migraine headache with aura 03/30/2013   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  PAIN:  Are you having pain? No  SOZO SCREENING: Patient was assessed today using the SOZO machine to determine the lymphedema index score. This was compared to her baseline score. It was determined that she is within the recommended range when compared to her baseline and no further action is needed at this time. She will continue  SOZO screenings. These are done every 3 months for 2 years post operatively followed by every 6 months for 2 years, and then annually.     [facility] [place], PT 03/04/2022, 4:08 PM

## 2022-04-02 ENCOUNTER — Encounter: Payer: Self-pay | Admitting: Physical Therapy

## 2022-04-14 ENCOUNTER — Other Ambulatory Visit (HOSPITAL_BASED_OUTPATIENT_CLINIC_OR_DEPARTMENT_OTHER): Payer: Self-pay

## 2022-04-14 ENCOUNTER — Other Ambulatory Visit (HOSPITAL_COMMUNITY): Payer: Self-pay

## 2022-04-14 MED ORDER — WEGOVY 0.25 MG/0.5ML ~~LOC~~ SOAJ
0.2500 mg | SUBCUTANEOUS | 0 refills | Status: DC
  Filled 2022-04-14 (×2): qty 2, 28d supply, fill #0

## 2022-06-11 ENCOUNTER — Ambulatory Visit: Payer: BC Managed Care – PPO | Admitting: Physical Therapy

## 2022-07-06 ENCOUNTER — Other Ambulatory Visit: Payer: Self-pay | Admitting: Family Medicine

## 2022-07-22 ENCOUNTER — Telehealth: Payer: Self-pay | Admitting: *Deleted

## 2022-07-22 NOTE — Telephone Encounter (Signed)
Spoke with the patient regarding the referral to GYN oncology. Patient scheduled as new patient with Dr  Ernestina Patches on 09/01/22 at 11:15 am. Patient given an arrival time of 10:45 am.  Explained to the patient the the doctor will perform a pelvic exam at this visit. Patient given the policy that no visitors under the 16 yrs are allowed in the [facility]. Patient given the address/phone number for the clinic and that the center offers free valet service. Patient offered earlier appt, patient declined to work schedule

## 2022-08-18 DIAGNOSIS — M5430 Sciatica, unspecified side: Secondary | ICD-10-CM

## 2022-08-18 HISTORY — DX: Sciatica, unspecified side: M54.30

## 2022-09-01 ENCOUNTER — Inpatient Hospital Stay: Payer: BC Managed Care – PPO | Attending: Psychiatry | Admitting: Psychiatry

## 2022-09-01 ENCOUNTER — Encounter: Payer: Self-pay | Admitting: Psychiatry

## 2022-09-01 VITALS — BP 117/92 | HR 75 | Temp 98.5°F | Resp 14 | Ht 66.34 in | Wt 213.0 lb

## 2022-09-01 DIAGNOSIS — E785 Hyperlipidemia, unspecified: Secondary | ICD-10-CM | POA: Diagnosis not present

## 2022-09-01 DIAGNOSIS — D0511 Intraductal carcinoma in situ of right breast: Secondary | ICD-10-CM | POA: Diagnosis not present

## 2022-09-01 DIAGNOSIS — Z6834 Body mass index (BMI) 34.0-34.9, adult: Secondary | ICD-10-CM | POA: Insufficient documentation

## 2022-09-01 DIAGNOSIS — Z801 Family history of malignant neoplasm of trachea, bronchus and lung: Secondary | ICD-10-CM | POA: Diagnosis not present

## 2022-09-01 DIAGNOSIS — Z148 Genetic carrier of other disease: Secondary | ICD-10-CM | POA: Insufficient documentation

## 2022-09-01 DIAGNOSIS — Z79899 Other long term (current) drug therapy: Secondary | ICD-10-CM | POA: Insufficient documentation

## 2022-09-01 DIAGNOSIS — Z1501 Genetic susceptibility to malignant neoplasm of breast: Secondary | ICD-10-CM

## 2022-09-01 DIAGNOSIS — K219 Gastro-esophageal reflux disease without esophagitis: Secondary | ICD-10-CM | POA: Diagnosis not present

## 2022-09-01 DIAGNOSIS — D0512 Intraductal carcinoma in situ of left breast: Secondary | ICD-10-CM | POA: Insufficient documentation

## 2022-09-01 DIAGNOSIS — Z1509 Genetic susceptibility to other malignant neoplasm: Secondary | ICD-10-CM | POA: Insufficient documentation

## 2022-09-01 DIAGNOSIS — Z1502 Genetic susceptibility to malignant neoplasm of ovary: Secondary | ICD-10-CM | POA: Insufficient documentation

## 2022-09-01 DIAGNOSIS — Z780 Asymptomatic menopausal state: Secondary | ICD-10-CM | POA: Diagnosis not present

## 2022-09-01 DIAGNOSIS — Z87891 Personal history of nicotine dependence: Secondary | ICD-10-CM | POA: Diagnosis not present

## 2022-09-01 DIAGNOSIS — G4730 Sleep apnea, unspecified: Secondary | ICD-10-CM | POA: Insufficient documentation

## 2022-09-01 DIAGNOSIS — Z9013 Acquired absence of bilateral breasts and nipples: Secondary | ICD-10-CM | POA: Insufficient documentation
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
## 2022-09-01 NOTE — Patient Instructions (Addendum)
We will tentatively plan for surgery on Dec 30, 2022 at [facility] with Dr. Jeral Pinch. We will see you back in the office closer to the date for a preop appointment with Joylene John NP to discuss the instructions for before and after surgery.  You may also receive a phone call from the hospital to arrange for a pre-op appointment there as well. Usually both appointments can be combined on the same day.

## 2022-09-02 ENCOUNTER — Other Ambulatory Visit: Payer: Self-pay | Admitting: Gynecologic Oncology

## 2022-09-02 DIAGNOSIS — Z1502 Genetic susceptibility to malignant neoplasm of ovary: Secondary | ICD-10-CM

## 2022-09-02 DIAGNOSIS — Z1501 Genetic susceptibility to malignant neoplasm of breast: Secondary | ICD-10-CM

## 2022-09-05 ENCOUNTER — Encounter: Payer: Self-pay | Admitting: Psychiatry

## 2022-09-07 ENCOUNTER — Encounter: Payer: Self-pay | Admitting: Psychiatry

## 2022-12-15 ENCOUNTER — Other Ambulatory Visit: Payer: Self-pay

## 2022-12-15 ENCOUNTER — Inpatient Hospital Stay: Payer: BC Managed Care – PPO | Attending: Psychiatry | Admitting: Gynecologic Oncology

## 2022-12-15 ENCOUNTER — Inpatient Hospital Stay (HOSPITAL_BASED_OUTPATIENT_CLINIC_OR_DEPARTMENT_OTHER): Payer: BC Managed Care – PPO | Admitting: Psychiatry

## 2022-12-15 VITALS — BP 113/65 | HR 75 | Temp 98.1°F | Resp 14 | Ht 65.0 in | Wt 206.0 lb

## 2022-12-15 DIAGNOSIS — Z86000 Personal history of in-situ neoplasm of breast: Secondary | ICD-10-CM | POA: Diagnosis present

## 2022-12-15 DIAGNOSIS — Z148 Genetic carrier of other disease: Secondary | ICD-10-CM

## 2022-12-15 DIAGNOSIS — Z9013 Acquired absence of bilateral breasts and nipples: Secondary | ICD-10-CM | POA: Insufficient documentation

## 2022-12-15 DIAGNOSIS — Z1502 Genetic susceptibility to malignant neoplasm of ovary: Secondary | ICD-10-CM

## 2022-12-15 DIAGNOSIS — Z1589 Genetic susceptibility to other disease: Secondary | ICD-10-CM

## 2022-12-15 DIAGNOSIS — Z1501 Genetic susceptibility to malignant neoplasm of breast: Secondary | ICD-10-CM

## 2022-12-15 DIAGNOSIS — Z1509 Genetic susceptibility to other malignant neoplasm: Secondary | ICD-10-CM | POA: Insufficient documentation

## 2022-12-15 DIAGNOSIS — Z7189 Other specified counseling: Secondary | ICD-10-CM

## 2022-12-15 DIAGNOSIS — Z801 Family history of malignant neoplasm of trachea, bronchus and lung: Secondary | ICD-10-CM | POA: Diagnosis not present

## 2022-12-15 MED ORDER — IBUPROFEN 800 MG PO TABS: 800.0000 mg | ORAL_TABLET | Freq: Three times a day (TID) | ORAL | 0 refills | Status: DC | PRN

## 2022-12-15 MED ORDER — OXYCODONE HCL 5 MG PO TABS: 5.0000 mg | ORAL_TABLET | ORAL | 0 refills | Status: DC | PRN

## 2022-12-15 MED ORDER — SENNOSIDES-DOCUSATE SODIUM 8.6-50 MG PO TABS: 2.0000 | ORAL_TABLET | Freq: Every day | ORAL | 0 refills | Status: DC

## 2022-12-15 NOTE — Patient Instructions (Signed)
Preparing for your Surgery   Plan for surgery on Dec 30, 2022 with Dr. Meredith Newton at [facility]. You will be scheduled for robotic assisted laparoscopic bilateral salpingo-oophorectomy (removal of both ovaries and fallopian tubes).     Pre-operative Testing -You will receive a phone call from presurgical testing at [facility] to arrange for a pre-operative appointment and lab work.   -Bring your insurance card, copy of an advanced directive if applicable, medication list   -At that visit, you will be asked to sign a consent for a possible blood transfusion in case a transfusion becomes necessary during surgery.  The need for a blood transfusion is rare but having consent is a necessary part of your care.      -You should not be taking blood thinners or aspirin at least ten days prior to surgery unless instructed by your surgeon.   -Do not take supplements such as fish oil (omega 3), red yeast rice, turmeric before your surgery. You want to avoid medications with aspirin in them including headache powders such as BC or Goody's), Excedrin migraine.   Day Before Surgery at Home -You will be asked to take in a light diet the day before surgery. You will be advised you can have clear liquids up until 3 hours before your surgery.     Eat a light diet the day before surgery.  Examples including soups, broths, toast, yogurt, mashed potatoes.  AVOID GAS PRODUCING FOODS AND BEVERAGES. Things to avoid include carbonated beverages (fizzy beverages, sodas), raw fruits and raw vegetables (uncooked), or beans.    If your bowels are filled with gas, your surgeon will have difficulty visualizing your pelvic organs which increases your surgical risks.   Your role in recovery Your role is to become active as soon as directed by your doctor, while still giving yourself time to heal.  Rest when you feel tired. You will be asked to do the following in order to speed your recovery:   -  Cough and breathe deeply. This helps to clear and expand your lungs and can prevent pneumonia after surgery.  - STAY ACTIVE WHEN YOU GET HOME. Do mild physical activity. Walking or moving your legs help your circulation and body functions return to normal. Do not try to get up or walk alone the first time after surgery.   -If you develop swelling on one leg or the other, pain in the back of your leg, redness/warmth in one of your legs, please call the office or go to the Emergency Room to have a doppler to rule out a blood clot. For shortness of breath, chest pain-seek care in the Emergency Room as soon as possible. - Actively manage your pain. Managing your pain lets you move in comfort. We will ask you to rate your pain on a scale of zero to 10. It is your responsibility to tell your doctor or nurse where and how much you hurt so your pain can be treated.   Special Considerations -If you are diabetic, you may be placed on insulin after surgery to have closer control over your blood sugars to promote healing and recovery.  This does not mean that you will be discharged on insulin.  If applicable, your oral antidiabetics will be resumed when you are tolerating a solid diet.   -Your final pathology results from surgery should be available around one week after surgery and the results will be relayed to you when available.   -Dr.   Lisa Jackson-Moore is the surgeon that assists your GYN Oncologist with surgery.  If you end up staying the night, the next day after your surgery you will either see Dr. Tucker, Dr. Newton, or Dr. Lisa Jackson-Moore.   -FMLA forms can be faxed to [phone] and please allow 5-7 business days for completion.   Pain Management After Surgery -You have been prescribed your pain medication and bowel regimen medications before surgery so that you can have these available when you are discharged from the hospital. The pain medication is for use ONLY AFTER surgery and a new  prescription will not be given.    -Make sure that you have Tylenol and Ibuprofen IF YOU ARE ABLE TO TAKE THESE MEDICATIONS at home to use on a regular basis after surgery for pain control. We recommend alternating the medications every hour to six hours since they work differently and are processed in the body differently for pain relief.   -Review the attached handout on narcotic use and their risks and side effects.    Bowel Regimen -You have been prescribed Sennakot-S to take nightly to prevent constipation especially if you are taking the narcotic pain medication intermittently.  It is important to prevent constipation and drink adequate amounts of liquids. You can stop taking this medication when you are not taking pain medication and you are back on your normal bowel routine.   Risks of Surgery Risks of surgery are low but include bleeding, infection, damage to surrounding structures, re-operation, blood clots, and very rarely death.     Blood Transfusion Information (For the consent to be signed before surgery)   We will be checking your blood type before surgery so in case of emergencies, we will know what type of blood you would need.                                             WHAT IS A BLOOD TRANSFUSION?   A transfusion is the replacement of blood or some of its parts. Blood is made up of multiple cells which provide different functions. Red blood cells carry oxygen and are used for blood loss replacement. White blood cells fight against infection. Platelets control bleeding. Plasma helps clot blood. Other blood products are available for specialized needs, such as hemophilia or other clotting disorders. BEFORE THE TRANSFUSION  Who gives blood for transfusions?  You may be able to donate blood to be used at a later date on yourself (autologous donation). Relatives can be asked to donate blood. This is generally not any safer than if you have received blood from a stranger. The  same precautions are taken to ensure safety when a relative's blood is donated. Healthy volunteers who are fully evaluated to make sure their blood is safe. This is blood bank blood. Transfusion therapy is the safest it has ever been in the practice of medicine. Before blood is taken from a donor, a complete history is taken to make sure that person has no history of diseases nor engages in risky social behavior (examples are intravenous drug use or sexual activity with multiple partners). The donor's travel history is screened to minimize risk of transmitting infections, such as malaria. The donated blood is tested for signs of infectious diseases, such as HIV and hepatitis. The blood is then tested to be sure it is compatible with you in order to   minimize the chance of a transfusion reaction. If you or a relative donates blood, this is often done in anticipation of surgery and is not appropriate for emergency situations. It takes many days to process the donated blood. RISKS AND COMPLICATIONS Although transfusion therapy is very safe and saves many lives, the main dangers of transfusion include:  Getting an infectious disease. Developing a transfusion reaction. This is an allergic reaction to something in the blood you were given. Every precaution is taken to prevent this. The decision to have a blood transfusion has been considered carefully by your caregiver before blood is given. Blood is not given unless the benefits outweigh the risks.   AFTER SURGERY INSTRUCTIONS   Return to work: 4-6 weeks if applicable   Activity: 1. Be up and out of the bed during the day.  Take a nap if needed.  You may walk up steps but be careful and use the hand rail.  Stair climbing will tire you more than you think, you may need to stop part way and rest.    2. No lifting or straining for 6 weeks over 10 pounds. No pushing, pulling, straining for 6 weeks.   3. No driving for around 1 week(s).  Do not drive if you  are taking narcotic pain medicine and make sure that your reaction time has returned.    4. You can shower as soon as the next day after surgery. Shower daily.  Use your regular soap and water (not directly on the incision) and pat your incision(s) dry afterwards; don't rub.  No tub baths or submerging your body in water until cleared by your surgeon. If you have the soap that was given to you by pre-surgical testing that was used before surgery, you do not need to use it afterwards because this can irritate your incisions.    5. No sexual activity and nothing in the vagina for 4-6 weeks.   6. You may experience a small amount of clear drainage from your incisions, which is normal.  If the drainage persists, increases, or changes color please call the office.   7. Do not use creams, lotions, or ointments such as neosporin on your incisions after surgery until advised by your surgeon because they can cause removal of the dermabond glue on your incisions.     8. You may experience vaginal spotting after surgery.  The spotting is normal but if you experience heavy bleeding, call our office.   9. Take Tylenol or ibuprofen first for pain if you are able to take these medications and only use narcotic pain medication for severe pain not relieved by the Tylenol or Ibuprofen.  Monitor your Tylenol intake to a max of 4,000 mg in a 24 hour period. You can alternate these medications after surgery.   Diet: 1. Low sodium Heart Healthy Diet is recommended but you are cleared to resume your normal (before surgery) diet after your procedure.   2. It is safe to use a laxative, such as Miralax or Colace, if you have difficulty moving your bowels. You have been prescribed Sennakot-S to take at bedtime every evening after surgery to keep bowel movements regular and to prevent constipation.     Wound Care: 1. Keep clean and dry.  Shower daily.   Reasons to call the Doctor: Fever - Oral temperature greater than  100.4 degrees Fahrenheit Foul-smelling vaginal discharge Difficulty urinating Nausea and vomiting Increased pain at the site of the incision that is unrelieved with   pain medicine. Difficulty breathing with or without chest pain New calf pain especially if only on one side Sudden, continuing increased vaginal bleeding with or without clots.   Contacts: For questions or concerns you should contact:   Dr. Meredith Newton at [phone]   Tailer Edlin Ford, NP at [phone]   After Hours: call [phone] and have the GYN Oncologist paged/contacted (after 5 pm or on the weekends). You will speak with an after hours RN and let he or she know you have had surgery.   Messages sent via mychart are for non-urgent matters and are not responded to after hours so for urgent needs, please call the after hours number.   

## 2022-12-15 NOTE — Progress Notes (Unsigned)
Gynecologic Oncology Return Clinic Visit  Date of Service: 12/15/2022 Referring Provider: Richardean Chimera, MD   Assessment & Plan: Diana Tate is a 52 y.o. 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   Briefly reviewed the associated risk of ovarian cancer with the report mutation is 5 to 15%.  Based on NCCN guidelines, recommendation for risk reducing removal of tubes and ovaries by age 73-50.  Given her history of hormone receptor positive breast cancer, she is not a candidate for postoperative HRT.  Thus, no direct benefit for hysterectomy at time of risk reducing BSO.  At this time, patient is ready to proceed with risk-reducing surgery.  Patient was consented for: Robotic assisted bilateral salpingo-oophorectomy on 12/30/2022.  The risks of surgery were discussed in detail and she understands these to including but not limited to bleeding requiring a blood transfusion, infection, injury to adjacent organs (including but not limited to the bowels, bladder, ureters, nerves, blood vessels), thromboembolic events, wound separation, hernia, unforseen complication, and possible need for re-exploration.  If the patient experiences any of these events, she understands that her hospitalization or recovery may be prolonged and that she may need to take additional medications for a prolonged period. The patient will receive DVT and antibiotic prophylaxis as indicated. She voiced a clear understanding. She had the opportunity to ask questions and informed consent was obtained today. She wishes to proceed.  She does not require preoperative clearance. Her METs are >4.  All preoperative instructions were reviewed. Postoperative expectations were also reviewed. Written handouts were provided to the patient.   Patient works in the [employer].  Her job is primarily seated/desk job.  She is planning to take 2 weeks off postoperatively.  Feel that this is  appropriate.   RTC postop.  Clide Cliff, MD Gynecologic Oncology   Medical Decision Making I personally spent  TOTAL 20 minutes face-to-face and non-face-to-face in the care of this patient, which includes all pre, intra, and post visit time on the date of service.  3 minutes spent reviewing records prior to the visit 15 Minutes in patient contact 2 minutes charting , conferring with consultants etc.   ----------------------- Reason for Visit: Follow-up  Treatment History: Oncology History  Ductal carcinoma in situ (DCIS) of right breast  07/17/2021 Mammogram   CLINICAL DATA:  One year follow-up of probably benign right breast calcifications.   The patient also states that she has had a spontaneous yellow to blood-tinged right nipple discharge for several months.   EXAM: DIGITAL DIAGNOSTIC BILATERAL MAMMOGRAM WITH TOMOSYNTHESIS AND CAD; ULTRASOUND RIGHT BREAST LIMITED  IMPRESSION: Right breast 9:30 o'clock ill-defined sonographically seen mass, which corresponds to an area of thickening on physical exam, for which ultrasound-guided core needle biopsy is recommended. Attention on postprocedural mammogram is recommended to establish whether this abnormality corresponds to the focal asymmetry seen mammographically the right breast upper outer quadrant, posterior depth.   Two groups of probably benign calcifications in the right breast upper outer quadrant, waxing and waning in nature, likely representing secretory calcifications associated with micro and macro cysts.   07/26/2021 Initial Biopsy   Diagnosis Breast, right, needle core biopsy, upper outer quadrant - DUCTAL CARCINOMA IN SITU - SEE COMMENT Microscopic Comment Based on the biopsy, the ductal carcinoma in situ has a solid pattern, intermediate nuclear grade and measures 0.6 cm  PROGNOSTIC INDICATORS Results: Estrogen Receptor: 95%, POSITIVE, MODERATE STAINING INTENSITY Progesterone Receptor: 100%,  POSITIVE, STRONG STAINING  INTENSITY   08/02/2021 Pathology Results   Diagnosis 1. Breast, right, needle core biopsy, lower outer quadrant ,anterior depth - DUCTAL CARCINOMA IN SITU WITH CALCIFICATIONS AND NECROSIS - SEE COMMENT 2. Breast, right, needle core biopsy, lower outer quadrant, posterior depth - DUCTAL CARCINOMA IN SITU WITH CALCIFICATIONS - SEE COMMENT 3. Breast, left, needle core biopsy, upper outer quadrant - DUCTAL CARCINOMA IN SITU - SEE COMMENT Microscopic Comment 1. The carcinoma in situ appears to partially be involving a papillary lesion. Based on the biopsy, the ductal carcinoma in situ has a comedo pattern, high nuclear grade and measures 0.4 cm in greatest linear extent.  3. Cytokeratin 5/6 is negative and ER strongly positive in the focus of ductal carcinoma in situ. Based on the biopsy, the ductal carcinoma in situ has a comedo-like pattern, intermediate nuclear grade and measures 0.1 cm in greatest linear extent.   1. PROGNOSTIC INDICATORS Results: Estrogen Receptor: 95%, POSITIVE, MODERATAE STAINING INTENSITY Progesterone Receptor: 100%, POSITIVE, STRONG STAINING INTENSITY  3. PROGNOSTIC INDICATORS Results: Estrogen Receptor: 95%, POSITIVE, MODERATE STAINING INTENSITY Progesterone Receptor: >95%, POSITIVE, STRONG STAINING INTENSITY   08/09/2021 Initial Diagnosis   Ductal carcinoma in situ (DCIS) of right breast   09/16/2021 Definitive Surgery   FINAL MICROSCOPIC DIAGNOSIS:   A. BREAST, RIGHT, MASTECTOMY:  - Three foci of ductal carcinoma in situ, intermediate grade, with focal necrosis, measuring 2.1 cm (cylindrical clip), 1.3 cm (ribbon clip) and 1.6 cm (barbell clip)  - All resection margins are remote from DCIS  - No evidence of invasive carcinoma  - Benign unremarkable nipple  - Fibrocystic change  - Biopsy site changes  - See oncology table   B. BREAST, LEFT, MASTECTOMY:  - Ductal carcinoma in situ, intermediate grade, 1.2 cm (barbell clip)  -  Resection margins are negative for DCIS  - No evidence of invasive carcinoma  - Benign unremarkable nipple  - Biopsy site changes  - See oncology table   C. LYMPH NODE, RIGHT AXILLARY #1, SENTINEL, EXCISION:  - Lymph node, negative for carcinoma (0/1)   D. LYMPH NODE, RIGHT AXILLARY, SENTINEL, EXCISION:  - Lymph node, negative for carcinoma (0/1)   E. LYMPH NODE, RIGHT AXILLARY, SENTINEL, EXCISION:  - Lymph node, negative for carcinoma (0/1)   F. LYMPH NODE, RIGHT AXILLARY, SENTINEL, EXCISION:  - Lymph node, negative for carcinoma (0/1)   G. LYMPH NODE, RIGHT AXILLARY, SENTINEL, EXCISION:  - Lymph node, negative for carcinoma (0/1)   H. LYMPH NODE, RIGHT AXILLARY #2, SENTINEL, EXCISION:  - Lymph node, negative for carcinoma (0/1)   I. LYMPH NODE, LEFT AXILLARY #1, SENTINEL, EXCISION:  - Lymph node, negative for carcinoma (0/1)   J. LYMPH NODE, LEFT AXILLARY, SENTINEL, EXCISION:  - Lymph node, negative for carcinoma (0/1)   K. LYMPH NODE, LEFT AXILLARY, SENTINEL, EXCISION:  - Lymph node, negative for carcinoma (0/1)   L. LYMPH NODE, LEFT AXILLARY, SENTINEL, EXCISION:  - Lymph node, negative for carcinoma (0/1)   M. LYMPH NODE, LEFT AXILLARY #2, SENTINEL, EXCISION:  - Lymph node, negative for carcinoma (0/1)   N. LYMPH NODE, LEFT AXILLARY #3, SENTINEL, EXCISION:  - Lymph node, negative for carcinoma (0/1)      Interval History: On presentation today patient reports that she is overall doing well.  She denies any new vaginal bleeding, abdominal/pelvic pain, unintentional weight loss, change in bowel or bladder habits, early satiety, bloating, nausea/vomiting.    Past Medical/Surgical History: Past Medical History:  Diagnosis Date   Cancer (HCC)  GERD (gastroesophageal reflux disease)    Hyperlipidemia    Migraine    Classic   Sleep apnea     Past Surgical History:  Procedure Laterality Date   AXILLARY SENTINEL NODE BIOPSY Bilateral 09/16/2021   Procedure:  BILATERAL AXILLARY SENTINEL NODE BIOPSY;  Surgeon: Griselda Miner, MD;  Location: [facility] OR;  Service: General;  Laterality: Bilateral;   BREAST BIOPSY Right 08/02/2021   2 areas   BREAST BIOPSY Left 08/02/2021   BREAST EXCISIONAL BIOPSY Right 04/2012   BREAST RECONSTRUCTION WITH PLACEMENT OF TISSUE EXPANDER AND FLEX HD (ACELLULAR HYDRATED DERMIS) Bilateral 09/16/2021   Procedure: BREAST RECONSTRUCTION WITH PLACEMENT OF TISSUE EXPANDER AND FLEX HD (ACELLULAR HYDRATED DERMIS);  Surgeon: Peggye Form, DO;  Location: [facility] OR;  Service: Plastics;  Laterality: Bilateral;   BREAST SURGERY     benign   CHOLECYSTECTOMY  2006   ENDOMETRIAL ABLATION  2007   ESOPHAGOGASTRODUODENOSCOPY N/A 05/22/2014   SLF: 1. Schatzki ring was found 2. MIld non-erosive gastritis   REMOVAL OF BILATERAL TISSUE EXPANDERS WITH PLACEMENT OF BILATERAL BREAST IMPLANTS Bilateral 12/05/2021   Procedure: REMOVAL OF BILATERAL TISSUE EXPANDERS WITH PLACEMENT OF BILATERAL BREAST IMPLANTS;  Surgeon: Peggye Form, DO;  Location: [facility];  Service: Plastics;  Laterality: Bilateral;  2 hours   TONSILLECTOMY  2000   TOTAL MASTECTOMY Bilateral 09/16/2021   Procedure: BILATERAL TOTAL MASTECTOMY;  Surgeon: Griselda Miner, MD;  [facility];  Service: General;  Laterality: Bilateral;    Family History  Problem Relation Age of Onset   Hypertension Mother    Lung cancer Mother    Cancer Maternal Grandfather        skin   Lung cancer Maternal Uncle    Diabetes Other        second degree relative   Colon cancer Neg Hx    Esophageal cancer Neg Hx    Breast cancer Neg Hx    Ovarian cancer Neg Hx    Endometrial cancer Neg Hx     Social History   Socioeconomic History   Marital status: Married    Spouse name: Not on file   Number of children: 2   Years of education: Not on file   Highest education level: Not on file  Occupational History   Occupation: Museum/gallery exhibitions officer: [employer]  Tobacco Use   Smoking status: Former    Packs/day: 1.00    Years: 24.00    Additional pack years: 0.00    Total pack years: 24.00    Types: Cigarettes    Quit date: 2017    Years since quitting: 7.3   Smokeless tobacco: Never   Tobacco comments:    Quit x 2 years  Vaping Use   Vaping Use: Never used  Substance and Sexual Activity   Alcohol use: No    Alcohol/week: 0.0 standard drinks of alcohol    Comment: occ   Drug use: No   Sexual activity: Yes  Other Topics Concern   Not on file  Social History Narrative   Not on file   Social Determinants of Health   Financial Resource Strain: Not on file  Food Insecurity: Not on file  Transportation Needs: Not on file  Physical Activity: Not on file  Stress: Not on file  Social Connections: Not on file    Current Medications:  Current Outpatient Medications:    Nutritional Supplements (LIPOTROPIC COMPLEX PO), Take by mouth 3 (  three) times daily. Oral Drops [facility], Disp: , Rfl:    pantoprazole (PROTONIX) 20 MG tablet, Take 20 mg by mouth daily as needed for heartburn or indigestion., Disp: , Rfl:   Review of Symptoms: Complete 10-system review is negative except as above in Interval History.  Physical Exam: LMP 01/16/2009 (Approximate) Comment: Patient states her MD did bloodwork in October 2022 and told patient she is postmenopausal. Patient also had an ablation. Last menstrual cycle was 13 years ago. General: Alert, oriented, no acute distress. HEENT: Normocephalic, atraumatic. Neck symmetric without masses. Sclera anicteric.  Chest: Normal work of breathing. Clear to auscultation bilaterally.  Cardiovascular: Regular rate and rhythm, no murmurs. Abdomen: Soft, nontender.  Normoactive bowel sounds.  Extremities: Grossly normal range of motion.  Warm, well perfused.  No edema bilaterally. Skin: No rashes or lesions noted.  Laboratory & Radiologic Studies: None

## 2022-12-15 NOTE — H&P (View-Only) (Signed)
Gynecologic Oncology Return Clinic Visit  Date of Service: 12/15/2022 Referring Provider: John McComb, MD   Assessment & Plan: Diana Tate is a 51 y.o. 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   Briefly reviewed the associated risk of ovarian cancer with the report mutation is 5 to 15%.  Based on NCCN guidelines, recommendation for risk reducing removal of tubes and ovaries by age 45-50.  Given her history of hormone receptor positive breast cancer, she is not a candidate for postoperative HRT.  Thus, no direct benefit for hysterectomy at time of risk reducing BSO.  At this time, patient is ready to proceed with risk-reducing surgery.  Patient was consented for: Robotic assisted bilateral salpingo-oophorectomy on 12/30/2022.  The risks of surgery were discussed in detail and she understands these to including but not limited to bleeding requiring a blood transfusion, infection, injury to adjacent organs (including but not limited to the bowels, bladder, ureters, nerves, blood vessels), thromboembolic events, wound separation, hernia, unforseen complication, and possible need for re-exploration.  If the patient experiences any of these events, she understands that her hospitalization or recovery may be prolonged and that she may need to take additional medications for a prolonged period. The patient will receive DVT and antibiotic prophylaxis as indicated. She voiced a clear understanding. She had the opportunity to ask questions and informed consent was obtained today. She wishes to proceed.  She does not require preoperative clearance. Her METs are >4.  All preoperative instructions were reviewed. Postoperative expectations were also reviewed. Written handouts were provided to the patient.   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  She is planning to take 2 weeks off postoperatively.  Feel that this is  appropriate.   RTC postop.  Sonda Coppens, MD Gynecologic Oncology   Medical Decision Making I personally spent  TOTAL 20 minutes face-to-face and non-face-to-face in the care of this patient, which includes all pre, intra, and post visit time on the date of service.  3 minutes spent reviewing records prior to the visit 15 Minutes in patient contact 2 minutes charting , conferring with consultants etc.   ----------------------- Reason for Visit: Follow-up  Treatment History: Oncology History  Ductal carcinoma in situ (DCIS) of right breast  07/17/2021 Mammogram   CLINICAL DATA:  One year follow-up of probably benign right breast calcifications.   The patient also states that she has had a spontaneous yellow to blood-tinged right nipple discharge for several months.   EXAM: DIGITAL DIAGNOSTIC BILATERAL MAMMOGRAM WITH TOMOSYNTHESIS AND CAD; ULTRASOUND RIGHT BREAST LIMITED  IMPRESSION: Right breast 9:30 o'clock ill-defined sonographically seen mass, which corresponds to an area of thickening on physical exam, for which ultrasound-guided core needle biopsy is recommended. Attention on postprocedural mammogram is recommended to establish whether this abnormality corresponds to the focal asymmetry seen mammographically the right breast upper outer quadrant, posterior depth.   Two groups of probably benign calcifications in the right breast upper outer quadrant, waxing and waning in nature, likely representing secretory calcifications associated with micro and macro cysts.   07/26/2021 Initial Biopsy   Diagnosis Breast, right, needle core biopsy, upper outer quadrant - DUCTAL CARCINOMA IN SITU - SEE COMMENT Microscopic Comment Based on the biopsy, the ductal carcinoma in situ has a solid pattern, intermediate nuclear grade and measures 0.6 cm  PROGNOSTIC INDICATORS Results: Estrogen Receptor: 95%, POSITIVE, MODERATE STAINING INTENSITY Progesterone Receptor: 100%,  POSITIVE, STRONG STAINING   INTENSITY   08/02/2021 Pathology Results   Diagnosis 1. Breast, right, needle core biopsy, lower outer quadrant ,anterior depth - DUCTAL CARCINOMA IN SITU WITH CALCIFICATIONS AND NECROSIS - SEE COMMENT 2. Breast, right, needle core biopsy, lower outer quadrant, posterior depth - DUCTAL CARCINOMA IN SITU WITH CALCIFICATIONS - SEE COMMENT 3. Breast, left, needle core biopsy, upper outer quadrant - DUCTAL CARCINOMA IN SITU - SEE COMMENT Microscopic Comment 1. The carcinoma in situ appears to partially be involving a papillary lesion. Based on the biopsy, the ductal carcinoma in situ has a comedo pattern, high nuclear grade and measures 0.4 cm in greatest linear extent.  3. Cytokeratin 5/6 is negative and ER strongly positive in the focus of ductal carcinoma in situ. Based on the biopsy, the ductal carcinoma in situ has a comedo-like pattern, intermediate nuclear grade and measures 0.1 cm in greatest linear extent.   1. PROGNOSTIC INDICATORS Results: Estrogen Receptor: 95%, POSITIVE, MODERATAE STAINING INTENSITY Progesterone Receptor: 100%, POSITIVE, STRONG STAINING INTENSITY  3. PROGNOSTIC INDICATORS Results: Estrogen Receptor: 95%, POSITIVE, MODERATE STAINING INTENSITY Progesterone Receptor: >95%, POSITIVE, STRONG STAINING INTENSITY   08/09/2021 Initial Diagnosis   Ductal carcinoma in situ (DCIS) of right breast   09/16/2021 Definitive Surgery   FINAL MICROSCOPIC DIAGNOSIS:   A. BREAST, RIGHT, MASTECTOMY:  - Three foci of ductal carcinoma in situ, intermediate grade, with focal necrosis, measuring 2.1 cm (cylindrical clip), 1.3 cm (ribbon clip) and 1.6 cm (barbell clip)  - All resection margins are remote from DCIS  - No evidence of invasive carcinoma  - Benign unremarkable nipple  - Fibrocystic change  - Biopsy site changes  - See oncology table   B. BREAST, LEFT, MASTECTOMY:  - Ductal carcinoma in situ, intermediate grade, 1.2 cm (barbell clip)  -  Resection margins are negative for DCIS  - No evidence of invasive carcinoma  - Benign unremarkable nipple  - Biopsy site changes  - See oncology table   C. LYMPH NODE, RIGHT AXILLARY #1, SENTINEL, EXCISION:  - Lymph node, negative for carcinoma (0/1)   D. LYMPH NODE, RIGHT AXILLARY, SENTINEL, EXCISION:  - Lymph node, negative for carcinoma (0/1)   E. LYMPH NODE, RIGHT AXILLARY, SENTINEL, EXCISION:  - Lymph node, negative for carcinoma (0/1)   F. LYMPH NODE, RIGHT AXILLARY, SENTINEL, EXCISION:  - Lymph node, negative for carcinoma (0/1)   G. LYMPH NODE, RIGHT AXILLARY, SENTINEL, EXCISION:  - Lymph node, negative for carcinoma (0/1)   H. LYMPH NODE, RIGHT AXILLARY #2, SENTINEL, EXCISION:  - Lymph node, negative for carcinoma (0/1)   I. LYMPH NODE, LEFT AXILLARY #1, SENTINEL, EXCISION:  - Lymph node, negative for carcinoma (0/1)   J. LYMPH NODE, LEFT AXILLARY, SENTINEL, EXCISION:  - Lymph node, negative for carcinoma (0/1)   K. LYMPH NODE, LEFT AXILLARY, SENTINEL, EXCISION:  - Lymph node, negative for carcinoma (0/1)   L. LYMPH NODE, LEFT AXILLARY, SENTINEL, EXCISION:  - Lymph node, negative for carcinoma (0/1)   M. LYMPH NODE, LEFT AXILLARY #2, SENTINEL, EXCISION:  - Lymph node, negative for carcinoma (0/1)   N. LYMPH NODE, LEFT AXILLARY #3, SENTINEL, EXCISION:  - Lymph node, negative for carcinoma (0/1)      Interval History: On presentation today patient reports that she is overall doing well.  She denies any new vaginal bleeding, abdominal/pelvic pain, unintentional weight loss, change in bowel or bladder habits, early satiety, bloating, nausea/vomiting.    Past Medical/Surgical History: Past Medical History:  Diagnosis Date   Cancer (HCC)      GERD (gastroesophageal reflux disease)    Hyperlipidemia    Migraine    Classic   Sleep apnea     Past Surgical History:  Procedure Laterality Date   AXILLARY SENTINEL NODE BIOPSY Bilateral 09/16/2021   Procedure:  BILATERAL AXILLARY SENTINEL NODE BIOPSY;  Surgeon: Toth, Paul III, MD;  Location: [facility] OR;  Service: General;  Laterality: Bilateral;   BREAST BIOPSY Right 08/02/2021   2 areas   BREAST BIOPSY Left 08/02/2021   BREAST EXCISIONAL BIOPSY Right 04/2012   BREAST RECONSTRUCTION WITH PLACEMENT OF TISSUE EXPANDER AND FLEX HD (ACELLULAR HYDRATED DERMIS) Bilateral 09/16/2021   Procedure: BREAST RECONSTRUCTION WITH PLACEMENT OF TISSUE EXPANDER AND FLEX HD (ACELLULAR HYDRATED DERMIS);  Surgeon: Dillingham, Claire S, DO;  Location: [facility] OR;  Service: Plastics;  Laterality: Bilateral;   BREAST SURGERY     benign   CHOLECYSTECTOMY  2006   ENDOMETRIAL ABLATION  2007   ESOPHAGOGASTRODUODENOSCOPY N/A 05/22/2014   SLF: 1. Schatzki ring was found 2. MIld non-erosive gastritis   REMOVAL OF BILATERAL TISSUE EXPANDERS WITH PLACEMENT OF BILATERAL BREAST IMPLANTS Bilateral 12/05/2021   Procedure: REMOVAL OF BILATERAL TISSUE EXPANDERS WITH PLACEMENT OF BILATERAL BREAST IMPLANTS;  Surgeon: Dillingham, Claire S, DO;  Location: [facility];  Service: Plastics;  Laterality: Bilateral;  2 hours   TONSILLECTOMY  2000   TOTAL MASTECTOMY Bilateral 09/16/2021   Procedure: BILATERAL TOTAL MASTECTOMY;  Surgeon: Toth, Paul III, MD;  Location: [facility] OR;  Service: General;  Laterality: Bilateral;    Family History  Problem Relation Age of Onset   Hypertension Mother    Lung cancer Mother    Cancer Maternal Grandfather        skin   Lung cancer Maternal Uncle    Diabetes Other        second degree relative   Colon cancer Neg Hx    Esophageal cancer Neg Hx    Breast cancer Neg Hx    Ovarian cancer Neg Hx    Endometrial cancer Neg Hx     Social History   Socioeconomic History   Marital status: Married    Spouse name: Not on file   Number of children: 2   Years of education: Not on file   Highest education level: Not on file  Occupational History   Occupation: DEPUTY CLERK    Employer: [employer]  Tobacco Use   Smoking status: Former    Packs/day: 1.00    Years: 24.00    Additional pack years: 0.00    Total pack years: 24.00    Types: Cigarettes    Quit date: 2017    Years since quitting: 7.3   Smokeless tobacco: Never   Tobacco comments:    Quit x 2 years  Vaping Use   Vaping Use: Never used  Substance and Sexual Activity   Alcohol use: No    Alcohol/week: 0.0 standard drinks of alcohol    Comment: occ   Drug use: No   Sexual activity: Yes  Other Topics Concern   Not on file  Social History Narrative   Not on file   Social Determinants of Health   Financial Resource Strain: Not on file  Food Insecurity: Not on file  Transportation Needs: Not on file  Physical Activity: Not on file  Stress: Not on file  Social Connections: Not on file    Current Medications:  Current Outpatient Medications:    Nutritional Supplements (LIPOTROPIC COMPLEX PO), Take by mouth 3 (  three) times daily. Oral Drops [facility], Disp: , Rfl:    pantoprazole (PROTONIX) 20 MG tablet, Take 20 mg by mouth daily as needed for heartburn or indigestion., Disp: , Rfl:   Review of Symptoms: Complete 10-system review is negative except as above in Interval History.  Physical Exam: LMP 01/16/2009 (Approximate) Comment: Patient states her MD did bloodwork in October 2022 and told patient she is postmenopausal. Patient also had an ablation. Last menstrual cycle was 13 years ago. General: Alert, oriented, no acute distress. HEENT: Normocephalic, atraumatic. Neck symmetric without masses. Sclera anicteric.  Chest: Normal work of breathing. Clear to auscultation bilaterally.  Cardiovascular: Regular rate and rhythm, no murmurs. Abdomen: Soft, nontender.  Normoactive bowel sounds.  Extremities: Grossly normal range of motion.  Warm, well perfused.  No edema bilaterally. Skin: No rashes or lesions noted.  Laboratory & Radiologic Studies: None 

## 2022-12-16 NOTE — Patient Instructions (Signed)
Preparing for your Surgery   Plan for surgery on Dec 30, 2022 with Dr. Clide Cliff at [facility]. You will be scheduled for robotic assisted laparoscopic bilateral salpingo-oophorectomy (removal of both ovaries and fallopian tubes).     Pre-operative Testing -You will receive a phone call from presurgical testing at [facility] to arrange for a pre-operative appointment and lab work.   -Bring your insurance card, copy of an advanced directive if applicable, medication list   -At that visit, you will be asked to sign a consent for a possible blood transfusion in case a transfusion becomes necessary during surgery.  The need for a blood transfusion is rare but having consent is a necessary part of your care.      -You should not be taking blood thinners or aspirin at least ten days prior to surgery unless instructed by your surgeon.   -Do not take supplements such as fish oil (omega 3), red yeast rice, turmeric before your surgery. You want to avoid medications with aspirin in them including headache powders such as BC or Goody's), Excedrin migraine.   Day Before Surgery at Home -You will be asked to take in a light diet the day before surgery. You will be advised you can have clear liquids up until 3 hours before your surgery.     Eat a light diet the day before surgery.  Examples including soups, broths, toast, yogurt, mashed potatoes.  AVOID GAS PRODUCING FOODS AND BEVERAGES. Things to avoid include carbonated beverages (fizzy beverages, sodas), raw fruits and raw vegetables (uncooked), or beans.    If your bowels are filled with gas, your surgeon will have difficulty visualizing your pelvic organs which increases your surgical risks.   Your role in recovery Your role is to become active as soon as directed by your doctor, while still giving yourself time to heal.  Rest when you feel tired. You will be asked to do the following in order to speed your recovery:   -  Cough and breathe deeply. This helps to clear and expand your lungs and can prevent pneumonia after surgery.  - STAY ACTIVE WHEN YOU GET HOME. Do mild physical activity. Walking or moving your legs help your circulation and body functions return to normal. Do not try to get up or walk alone the first time after surgery.   -If you develop swelling on one leg or the other, pain in the back of your leg, redness/warmth in one of your legs, please call the office or go to the Emergency Room to have a doppler to rule out a blood clot. For shortness of breath, chest pain-seek care in the Emergency Room as soon as possible. - Actively manage your pain. Managing your pain lets you move in comfort. We will ask you to rate your pain on a scale of zero to 10. It is your responsibility to tell your doctor or nurse where and how much you hurt so your pain can be treated.   Special Considerations -If you are diabetic, you may be placed on insulin after surgery to have closer control over your blood sugars to promote healing and recovery.  This does not mean that you will be discharged on insulin.  If applicable, your oral antidiabetics will be resumed when you are tolerating a solid diet.   -Your final pathology results from surgery should be available around one week after surgery and the results will be relayed to you when available.   -Dr.  Antionette Char is the surgeon that assists your GYN Oncologist with surgery.  If you end up staying the night, the next day after your surgery you will either see Dr. Pricilla Holm, Dr. Alvester Morin, or Dr. Antionette Char.   -FMLA forms can be faxed to [phone] and please allow 5-7 business days for completion.   Pain Management After Surgery -You have been prescribed your pain medication and bowel regimen medications before surgery so that you can have these available when you are discharged from the hospital. The pain medication is for use ONLY AFTER surgery and a new  prescription will not be given.    -Make sure that you have Tylenol and Ibuprofen IF YOU ARE ABLE TO TAKE THESE MEDICATIONS at home to use on a regular basis after surgery for pain control. We recommend alternating the medications every hour to six hours since they work differently and are processed in the body differently for pain relief.   -Review the attached handout on narcotic use and their risks and side effects.    Bowel Regimen -You have been prescribed Sennakot-S to take nightly to prevent constipation especially if you are taking the narcotic pain medication intermittently.  It is important to prevent constipation and drink adequate amounts of liquids. You can stop taking this medication when you are not taking pain medication and you are back on your normal bowel routine.   Risks of Surgery Risks of surgery are low but include bleeding, infection, damage to surrounding structures, re-operation, blood clots, and very rarely death.     Blood Transfusion Information (For the consent to be signed before surgery)   We will be checking your blood type before surgery so in case of emergencies, we will know what type of blood you would need.                                             WHAT IS A BLOOD TRANSFUSION?   A transfusion is the replacement of blood or some of its parts. Blood is made up of multiple cells which provide different functions. Red blood cells carry oxygen and are used for blood loss replacement. White blood cells fight against infection. Platelets control bleeding. Plasma helps clot blood. Other blood products are available for specialized needs, such as hemophilia or other clotting disorders. BEFORE THE TRANSFUSION  Who gives blood for transfusions?  You may be able to donate blood to be used at a later date on yourself (autologous donation). Relatives can be asked to donate blood. This is generally not any safer than if you have received blood from a stranger. The  same precautions are taken to ensure safety when a relative's blood is donated. Healthy volunteers who are fully evaluated to make sure their blood is safe. This is blood bank blood. Transfusion therapy is the safest it has ever been in the practice of medicine. Before blood is taken from a donor, a complete history is taken to make sure that person has no history of diseases nor engages in risky social behavior (examples are intravenous drug use or sexual activity with multiple partners). The donor's travel history is screened to minimize risk of transmitting infections, such as malaria. The donated blood is tested for signs of infectious diseases, such as HIV and hepatitis. The blood is then tested to be sure it is compatible with you in order to  minimize the chance of a transfusion reaction. If you or a relative donates blood, this is often done in anticipation of surgery and is not appropriate for emergency situations. It takes many days to process the donated blood. RISKS AND COMPLICATIONS Although transfusion therapy is very safe and saves many lives, the main dangers of transfusion include:  Getting an infectious disease. Developing a transfusion reaction. This is an allergic reaction to something in the blood you were given. Every precaution is taken to prevent this. The decision to have a blood transfusion has been considered carefully by your caregiver before blood is given. Blood is not given unless the benefits outweigh the risks.   AFTER SURGERY INSTRUCTIONS   Return to work: 4-6 weeks if applicable   Activity: 1. Be up and out of the bed during the day.  Take a nap if needed.  You may walk up steps but be careful and use the hand rail.  Stair climbing will tire you more than you think, you may need to stop part way and rest.    2. No lifting or straining for 6 weeks over 10 pounds. No pushing, pulling, straining for 6 weeks.   3. No driving for around 1 week(s).  Do not drive if you  are taking narcotic pain medicine and make sure that your reaction time has returned.    4. You can shower as soon as the next day after surgery. Shower daily.  Use your regular soap and water (not directly on the incision) and pat your incision(s) dry afterwards; don't rub.  No tub baths or submerging your body in water until cleared by your surgeon. If you have the soap that was given to you by pre-surgical testing that was used before surgery, you do not need to use it afterwards because this can irritate your incisions.    5. No sexual activity and nothing in the vagina for 4-6 weeks.   6. You may experience a small amount of clear drainage from your incisions, which is normal.  If the drainage persists, increases, or changes color please call the office.   7. Do not use creams, lotions, or ointments such as neosporin on your incisions after surgery until advised by your surgeon because they can cause removal of the dermabond glue on your incisions.     8. You may experience vaginal spotting after surgery.  The spotting is normal but if you experience heavy bleeding, call our office.   9. Take Tylenol or ibuprofen first for pain if you are able to take these medications and only use narcotic pain medication for severe pain not relieved by the Tylenol or Ibuprofen.  Monitor your Tylenol intake to a max of 4,000 mg in a 24 hour period. You can alternate these medications after surgery.   Diet: 1. Low sodium Heart Healthy Diet is recommended but you are cleared to resume your normal (before surgery) diet after your procedure.   2. It is safe to use a laxative, such as Miralax or Colace, if you have difficulty moving your bowels. You have been prescribed Sennakot-S to take at bedtime every evening after surgery to keep bowel movements regular and to prevent constipation.     Wound Care: 1. Keep clean and dry.  Shower daily.   Reasons to call the Doctor: Fever - Oral temperature greater than  100.4 degrees Fahrenheit Foul-smelling vaginal discharge Difficulty urinating Nausea and vomiting Increased pain at the site of the incision that is unrelieved with  pain medicine. Difficulty breathing with or without chest pain New calf pain especially if only on one side Sudden, continuing increased vaginal bleeding with or without clots.   Contacts: For questions or concerns you should contact:   Dr. Bernadene Bell at [place], NP at [phone]   After Hours: call [phone] and have the GYN Oncologist paged/contacted (after 5 pm or on the weekends). You will speak with an after hours RN and let he or she know you have had surgery.   Messages sent via mychart are for non-urgent matters and are not responded to after hours so for urgent needs, please call the after hours number.

## 2022-12-16 NOTE — Progress Notes (Signed)
Patient here for follow up with Dr. Alvester Morin and for a pre-operative appointment prior to her scheduled surgery on Dec 30, 2022. She is scheduled for robotic assisted laparoscopic bilateral salpingo-oophorectomy. The surgery was discussed in detail.  See after visit summary for additional details.       Discussed post-op pain management in detail including the aspects of the enhanced recovery pathway.  Advised her that a new prescription would be sent in for oxycodone and it is only to be used for after her upcoming surgery.  We discussed the use of tylenol post-op and to monitor for a maximum of 4,000 mg in a 24 hour period.  Also prescribed sennakot to be used after surgery and to hold if having loose stools.  Discussed bowel regimen in detail.     Discussed the use of SCDs and measures to take at home to prevent DVT including frequent mobility.  Reportable signs and symptoms of DVT discussed. Post-operative instructions discussed and expectations for after surgery. Incisional care discussed as well including reportable signs and symptoms including erythema, drainage, wound separation.     10 minutes spent with the patient/preparing information.  Verbalizing understanding of material discussed. No needs or concerns voiced at the end of the visit.   Advised patient to call for any needs.  Advised that her post-operative medications had been prescribed and could be picked up at any time.    This appointment is included in the global surgical bundle as pre-operative teaching and has no charge.

## 2022-12-17 NOTE — Progress Notes (Addendum)
PCP - Aleatha Borer , NP  LOV 10-31-22 Cardiologist - no  PPM/ICD -  Device Orders -  Rep Notified -   Chest x-ray -  EKG -  Stress Test -  ECHO -  Cardiac Cath -   Sleep Study -  CPAP -   Fasting Blood Sugar -  Checks Blood Sugar _____ times a day  Blood Thinner Instructions: Aspirin Instructions:  ERAS Protcol - PRE-SURGERY no drink   COVID vaccine -no  Activity--Able to climb a flight of stairs without SOB or CP Anesthesia review: OSA  no cpap,   Patient denies shortness of breath, fever, cough and chest pain at PAT appointment   All instructions explained to the patient, with a verbal understanding of the material. Patient agrees to go over the instructions while at home for a better understanding. Patient also instructed to self quarantine after being tested for COVID-19. The opportunity to ask questions was provided.

## 2022-12-17 NOTE — Patient Instructions (Addendum)
SURGICAL WAITING ROOM VISITATION  Patients having surgery or a procedure may have no more than 2 support people in the waiting area - these visitors may rotate.    Children under the age of 65 must have an adult with them who is not the patient.  Due to an increase in RSV and influenza rates and associated hospitalizations, children ages 11 and under may not visit patients in [facility].  If the patient needs to stay at the hospital during part of their recovery, the visitor guidelines for inpatient rooms apply. Pre-op nurse will coordinate an appropriate time for 1 support person to accompany patient in pre-op.  This support person may not rotate.    Please refer to the [facility] website for the visitor guidelines for Inpatients (after your surgery is over and you are in a regular room).       Your procedure is scheduled on: 12-30-22   [facility]    Report to admitting at       0515  AM   Call this number if you have problems the morning of surgery [phone]  Eat a light diet the day before surgery.  Examples including soups, broths, toast, yogurt, mashed potatoes.  Things to avoid include carbonated beverages (fizzy beverages), raw fruits and raw vegetables, or beans.   If your bowels are filled with gas, your surgeon will have difficulty visualizing your pelvic organs which increases your surgical risks.   Do not eat food :After Midnight.   After Midnight you may have the following liquids until __0430____ AM/ DAY OF SURGERY  then nothing by mouth   Water Non-Citrus Juices (without pulp, NO RED-Apple, White grape, White cranberry) Black Coffee (NO MILK/CREAM OR CREAMERS, sugar ok)  Clear Tea (NO MILK/CREAM OR CREAMERS, sugar ok) regular and decaf                             Plain Jell-O (NO RED)                                           Fruit ices (not with fruit pulp, NO RED)                                     Popsicles (NO  RED)                                                               Sports drinks like Gatorade (NO RED)                             If you have questions, please contact your surgeon's office.   FOLLOW  ANY ADDITIONAL PRE OP INSTRUCTIONS YOU RECEIVED FROM YOUR SURGEON'S OFFICE!!!     Oral Hygiene is also important to reduce your risk of infection.  Remember - BRUSH YOUR TEETH THE MORNING OF SURGERY WITH YOUR REGULAR TOOTHPASTE  DENTURES WILL BE REMOVED PRIOR TO SURGERY PLEASE DO NOT APPLY "Poly grip" OR ADHESIVES!!!   Do NOT smoke after Midnight   Take these medicines the morning of surgery with A SIP OF WATER: pantoprazole    Bring CPAP mask and tubing day of surgery.                              You may not have any metal on your body including hair pins, jewelry, and body piercing             Do not wear make-up, lotions, powders, perfumes/cologne, or deodorant  Do not wear nail polish including gel and S&S, artificial/acrylic nails, or any other type of covering on natural nails including finger and toenails. If you have artificial nails, gel coating, etc. that needs to be removed by a nail salon please have this removed prior to surgery or surgery may need to be canceled/ delayed if the surgeon/ anesthesia feels like they are unable to be safely monitored.   Do not shave  48 hours prior to surgery.                Do not bring valuables to the hospital. [facility] IS NOT             RESPONSIBLE   FOR VALUABLES.   Contacts, glasses, dentures or bridgework may not be worn into surgery.   DO NOT BRING YOUR HOME MEDICATIONS TO THE HOSPITAL. PHARMACY WILL DISPENSE MEDICATIONS LISTED ON YOUR MEDICATION LIST TO YOU DURING YOUR ADMISSION IN THE HOSPITAL!    Patients discharged on the day of surgery will not be allowed to drive home.  Someone NEEDS to stay with you for the first 24 hours after anesthesia.   Special Instructions: Bring a  copy of your healthcare power of attorney and living will documents the day of surgery if you haven't scanned them before.              Please read over the following fact sheets you were given: IF YOU HAVE QUESTIONS ABOUT YOUR PRE-OP INSTRUCTIONS PLEASE CALL [phone]   I. If you test positive for Covid or have been in contact with anyone that has tested positive in the last 10 days please notify you surgeon.    [place] [facility] - Preparing for Surgery Before surgery, you can play an important role.  Because skin is not sterile, your skin needs to be as free of germs as possible.  You can reduce the number of germs on your skin by washing with CHG (chlorahexidine gluconate) soap before surgery.  CHG is an antiseptic cleaner which kills germs and bonds with the skin to continue killing germs even after washing. Please DO NOT use if you have an allergy to CHG or antibacterial soaps.  If your skin becomes reddened/irritated stop using the CHG and inform your nurse when you arrive at Short Stay. Do not shave (including legs and underarms) for at least 48 hours prior to the first CHG shower.  You may shave your face/neck. Please follow these instructions carefully:  1.  Shower with CHG Soap the night before surgery and the  morning of Surgery.  2.  If you choose to wash your hair, wash your hair first as usual with your  normal  shampoo.  3.  After you shampoo, rinse your  hair and body thoroughly to remove the  shampoo.                           4.  Use CHG as you would any other liquid soap.  You can apply chg directly  to the skin and wash                       Gently with a scrungie or clean washcloth.  5.  Apply the CHG Soap to your body ONLY FROM THE NECK DOWN.   Do not use on face/ open                           Wound or open sores. Avoid contact with eyes, ears mouth and genitals (private parts).                       Wash face,  Genitals (private parts) with your normal soap.             6.   Wash thoroughly, paying special attention to the area where your surgery  will be performed.  7.  Thoroughly rinse your body with warm water from the neck down.  8.  DO NOT shower/wash with your normal soap after using and rinsing off  the CHG Soap.                9.  Pat yourself dry with a clean towel.            10.  Wear clean pajamas.            11.  Place clean sheets on your bed the night of your first shower and do not  sleep with pets. Day of Surgery : Do not apply any lotions/deodorants the morning of surgery.  Please wear clean clothes to the hospital/surgery center.  FAILURE TO FOLLOW THESE INSTRUCTIONS MAY RESULT IN THE CANCELLATION OF YOUR SURGERY PATIENT SIGNATURE_________________________________  NURSE SIGNATURE__________________________________  ________________________________________________________________________ WHAT IS A BLOOD TRANSFUSION? Blood Transfusion Information  A transfusion is the replacement of blood or some of its parts. Blood is made up of multiple cells which provide different functions. Red blood cells carry oxygen and are used for blood loss replacement. White blood cells fight against infection. Platelets control bleeding. Plasma helps clot blood. Other blood products are available for specialized needs, such as hemophilia or other clotting disorders. BEFORE THE TRANSFUSION  Who gives blood for transfusions?  Healthy volunteers who are fully evaluated to make sure their blood is safe. This is blood bank blood. Transfusion therapy is the safest it has ever been in the practice of medicine. Before blood is taken from a donor, a complete history is taken to make sure that person has no history of diseases nor engages in risky social behavior (examples are intravenous drug use or sexual activity with multiple partners). The donor's travel history is screened to minimize risk of transmitting infections, such as malaria. The donated blood is tested for  signs of infectious diseases, such as HIV and hepatitis. The blood is then tested to be sure it is compatible with you in order to minimize the chance of a transfusion reaction. If you or a relative donates blood, this is often done in anticipation of surgery and is not appropriate for emergency situations. It takes many days to process the donated blood. RISKS AND  COMPLICATIONS Although transfusion therapy is very safe and saves many lives, the main dangers of transfusion include:  Getting an infectious disease. Developing a transfusion reaction. This is an allergic reaction to something in the blood you were given. Every precaution is taken to prevent this. The decision to have a blood transfusion has been considered carefully by your caregiver before blood is given. Blood is not given unless the benefits outweigh the risks. AFTER THE TRANSFUSION Right after receiving a blood transfusion, you will usually feel much better and more energetic. This is especially true if your red blood cells have gotten low (anemic). The transfusion raises the level of the red blood cells which carry oxygen, and this usually causes an energy increase. The nurse administering the transfusion will monitor you carefully for complications. HOME CARE INSTRUCTIONS  No special instructions are needed after a transfusion. You may find your energy is better. Speak with your caregiver about any limitations on activity for underlying diseases you may have. SEEK MEDICAL CARE IF:  Your condition is not improving after your transfusion. You develop redness or irritation at the intravenous (IV) site. SEEK IMMEDIATE MEDICAL CARE IF:  Any of the following symptoms occur over the next 12 hours: Shaking chills. You have a temperature by mouth above 102 F (38.9 C), not controlled by medicine. Chest, back, or muscle pain. People around you feel you are not acting correctly or are confused. Shortness of breath or difficulty  breathing. Dizziness and fainting. You get a rash or develop hives. You have a decrease in urine output. Your urine turns a dark color or changes to pink, red, or brown. Any of the following symptoms occur over the next 10 days: You have a temperature by mouth above 102 F (38.9 C), not controlled by medicine. Shortness of breath. Weakness after normal activity. The white part of the eye turns yellow (jaundice). You have a decrease in the amount of urine or are urinating less often. Your urine turns a dark color or changes to pink, red, or brown. Document Released: 08/01/2000 Document Revised: 10/27/2011 Document Reviewed: 03/20/2008 [facility] Patient Information 2014 [place], [place].  _______________________________________________________________________

## 2022-12-18 ENCOUNTER — Encounter: Payer: Self-pay | Admitting: Psychiatry

## 2022-12-25 ENCOUNTER — Other Ambulatory Visit: Payer: Self-pay

## 2022-12-25 ENCOUNTER — Encounter (HOSPITAL_COMMUNITY)
Admission: RE | Admit: 2022-12-25 | Discharge: 2022-12-25 | Disposition: A | Discharge status: Home / Self Care | Payer: BC Managed Care – PPO | Source: Ambulatory Visit | Attending: Psychiatry | Admitting: Psychiatry

## 2022-12-25 ENCOUNTER — Encounter (HOSPITAL_COMMUNITY): Payer: Self-pay

## 2022-12-25 VITALS — BP 115/86 | HR 59 | Temp 98.3°F | Resp 15 | Ht 65.0 in | Wt 204.0 lb

## 2022-12-25 DIAGNOSIS — R7303 Prediabetes: Secondary | ICD-10-CM

## 2022-12-25 DIAGNOSIS — Z01818 Encounter for other preprocedural examination: Secondary | ICD-10-CM | POA: Insufficient documentation

## 2022-12-25 DIAGNOSIS — Z1501 Genetic susceptibility to malignant neoplasm of breast: Secondary | ICD-10-CM | POA: Insufficient documentation

## 2022-12-25 DIAGNOSIS — Z1589 Genetic susceptibility to other disease: Secondary | ICD-10-CM | POA: Insufficient documentation

## 2022-12-25 HISTORY — DX: Prediabetes: R73.03

## 2022-12-25 LAB — COMPREHENSIVE METABOLIC PANEL
ALT: 35 U/L (ref 0–44)
AST: 18 U/L (ref 15–41)
Albumin: 4.2 g/dL (ref 3.5–5.0)
Alkaline Phosphatase: 84 U/L (ref 38–126)
Anion gap: 9 (ref 5–15)
BUN: 14 mg/dL (ref 6–20)
CO2: 26 mmol/L (ref 22–32)
Calcium: 9.7 mg/dL (ref 8.9–10.3)
Chloride: 103 mmol/L (ref 98–111)
Creatinine, Ser: 0.83 mg/dL (ref 0.44–1.00)
GFR, Estimated: >60 mL/min (ref >60)
Glucose, Bld: 85 mg/dL (ref 70–99)
Potassium: 3.7 mmol/L (ref 3.5–5.1)
Sodium: 138 mmol/L (ref 135–145)
Total Bilirubin: 0.8 mg/dL (ref 0.3–1.2)
Total Protein: 7.6 g/dL (ref 6.5–8.1)

## 2022-12-25 LAB — CBC
HCT: 40 % (ref 36.0–46.0)
Hemoglobin: 12.5 g/dL (ref 12.0–15.0)
MCH: 27.7 pg (ref 26.0–34.0)
MCHC: 31.3 g/dL (ref 30.0–36.0)
MCV: 88.5 fL (ref 80.0–100.0)
Platelets: 221 10*3/uL (ref 150–400)
RBC: 4.52 MIL/uL (ref 3.87–5.11)
RDW: 14.6 % (ref 11.5–15.5)
WBC: 6.7 10*3/uL (ref 4.0–10.5)
nRBC: 0 % (ref 0.0–0.2)

## 2022-12-25 LAB — GLUCOSE, CAPILLARY: Glucose-Capillary: 86 mg/dL (ref 70–99)

## 2022-12-29 ENCOUNTER — Telehealth: Payer: Self-pay | Admitting: Surgery

## 2022-12-29 NOTE — Discharge Instructions (Signed)
AFTER SURGERY INSTRUCTIONS   Return to work: 4-6 weeks if applicable   Activity: 1. Be up and out of the bed during the day.  Take a nap if needed.  You may walk up steps but be careful and use the hand rail.  Stair climbing will tire you more than you think, you may need to stop part way and rest.    2. No lifting or straining for 6 weeks over 10 pounds. No pushing, pulling, straining for 6 weeks.   3. No driving for around 1 week(s).  Do not drive if you are taking narcotic pain medicine and make sure that your reaction time has returned.    4. You can shower as soon as the next day after surgery. Shower daily.  Use your regular soap and water (not directly on the incision) and pat your incision(s) dry afterwards; don't rub.  No tub baths or submerging your body in water until cleared by your surgeon. If you have the soap that was given to you by pre-surgical testing that was used before surgery, you do not need to use it afterwards because this can irritate your incisions.    5. No sexual activity and nothing in the vagina for 4-6 weeks.   6. You may experience a small amount of clear drainage from your incisions, which is normal.  If the drainage persists, increases, or changes color please call the office.   7. Do not use creams, lotions, or ointments such as neosporin on your incisions after surgery until advised by your surgeon because they can cause removal of the dermabond glue on your incisions.     8. You may experience vaginal spotting after surgery.  The spotting is normal but if you experience heavy bleeding, call our office.   9. Take Tylenol or ibuprofen first for pain if you are able to take these medications and only use narcotic pain medication for severe pain not relieved by the Tylenol or Ibuprofen.  Monitor your Tylenol intake to a max of 4,000 mg in a 24 hour period. You can alternate these medications after surgery.   Diet: 1. Low sodium Heart Healthy Diet is  recommended but you are cleared to resume your normal (before surgery) diet after your procedure.   2. It is safe to use a laxative, such as Miralax or Colace, if you have difficulty moving your bowels. You have been prescribed Sennakot-S to take at bedtime every evening after surgery to keep bowel movements regular and to prevent constipation.     Wound Care: 1. Keep clean and dry.  Shower daily.   Reasons to call the Doctor: Fever - Oral temperature greater than 100.4 degrees Fahrenheit Foul-smelling vaginal discharge Difficulty urinating Nausea and vomiting Increased pain at the site of the incision that is unrelieved with pain medicine. Difficulty breathing with or without chest pain New calf pain especially if only on one side Sudden, continuing increased vaginal bleeding with or without clots.   Contacts: For questions or concerns you should contact:   Dr. Meredith Newton at [phone]   Odette Corrie Reder, NP at [phone]   After Hours: call [phone] and have the GYN Oncologist paged/contacted (after 5 pm or on the weekends). You will speak with an after hours RN and let he or she know you have had surgery.   Messages sent via mychart are for non-urgent matters and are not responded to after hours so for urgent needs, please call the after hours number.   

## 2022-12-29 NOTE — Anesthesia Preprocedure Evaluation (Signed)
Anesthesia Evaluation  Patient identified by MRN, date of birth, ID band Patient awake    Reviewed: Allergy & Precautions, NPO status , Patient's Chart, lab work & pertinent test results  History of Anesthesia Complications Negative for: history of anesthetic complications  Airway Mallampati: III  TM Distance: >3 FB Neck ROM: Full    Dental  (+) Dental Advisory Given, Teeth Intact   Pulmonary sleep apnea (does not use CPAP) , former smoker   breath sounds clear to auscultation       Cardiovascular negative cardio ROS  Rhythm:Regular Rate:Normal     Neuro/Psych  Headaches    GI/Hepatic Neg liver ROS,GERD  Medicated and Controlled,,  Endo/Other  BMI 34  Renal/GU negative Renal ROS     Musculoskeletal   Abdominal  (+) + obese  Peds  Hematology negative hematology ROS (+)   Anesthesia Other Findings H/o breast cancer  Reproductive/Obstetrics                              Anesthesia Physical Anesthesia Plan  ASA: 2  Anesthesia Plan: General   Post-op Pain Management: Tylenol PO (pre-op)*   Induction: Intravenous  PONV Risk Score and Plan: 3 and Ondansetron, Dexamethasone and Scopolamine patch - Pre-op  Airway Management Planned: Oral ETT and Video Laryngoscope Planned  Additional Equipment: None  Intra-op Plan:   Post-operative Plan: Extubation in OR  Informed Consent: I have reviewed the patients History and Physical, chart, labs and discussed the procedure including the risks, benefits and alternatives for the proposed anesthesia with the patient or authorized representative who has indicated his/her understanding and acceptance.     Dental advisory given  Plan Discussed with: CRNA and Surgeon  Anesthesia Plan Comments:          Anesthesia Quick Evaluation

## 2022-12-29 NOTE — Telephone Encounter (Signed)
Telephone call to check on pre-operative status.  Patient compliant with pre-operative instructions.  Reinforced nothing to eat after midnight. Clear liquids until 4:15am. Patient to arrive at 5:15am. Verified that post-op medications have been sent to patient's preferred pharmacy.  No questions or concerns voiced.  Instructed to call for any needs.  Patient post op call moved to 6/17 due to patient request. Per Dr Alvester Morin, patient ok to have delayed post op visit as long as she has a non lifting job. 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 No other concerns at this time.

## 2022-12-30 ENCOUNTER — Other Ambulatory Visit: Payer: Self-pay

## 2022-12-30 ENCOUNTER — Encounter (HOSPITAL_COMMUNITY)
Admission: RE | Disposition: A | Discharge status: Home / Self Care | Payer: Self-pay | Source: Home / Self Care | Attending: Psychiatry

## 2022-12-30 ENCOUNTER — Ambulatory Visit (HOSPITAL_COMMUNITY)
Admission: RE | Admit: 2022-12-30 | Discharge: 2022-12-30 | Disposition: A | Discharge status: Home / Self Care | Payer: BC Managed Care – PPO | Attending: Psychiatry | Admitting: Psychiatry

## 2022-12-30 ENCOUNTER — Encounter (HOSPITAL_COMMUNITY): Payer: Self-pay | Admitting: Psychiatry

## 2022-12-30 ENCOUNTER — Ambulatory Visit (HOSPITAL_COMMUNITY): Payer: BC Managed Care – PPO | Admitting: Anesthesiology

## 2022-12-30 DIAGNOSIS — Z1509 Genetic susceptibility to other malignant neoplasm: Secondary | ICD-10-CM | POA: Diagnosis not present

## 2022-12-30 DIAGNOSIS — R7303 Prediabetes: Secondary | ICD-10-CM

## 2022-12-30 DIAGNOSIS — E785 Hyperlipidemia, unspecified: Secondary | ICD-10-CM | POA: Diagnosis not present

## 2022-12-30 DIAGNOSIS — Z853 Personal history of malignant neoplasm of breast: Secondary | ICD-10-CM | POA: Insufficient documentation

## 2022-12-30 DIAGNOSIS — Z4002 Encounter for prophylactic removal of ovary: Secondary | ICD-10-CM | POA: Diagnosis not present

## 2022-12-30 DIAGNOSIS — Z87891 Personal history of nicotine dependence: Secondary | ICD-10-CM | POA: Insufficient documentation

## 2022-12-30 DIAGNOSIS — Z6834 Body mass index (BMI) 34.0-34.9, adult: Secondary | ICD-10-CM | POA: Insufficient documentation

## 2022-12-30 DIAGNOSIS — Z1501 Genetic susceptibility to malignant neoplasm of breast: Secondary | ICD-10-CM | POA: Diagnosis not present

## 2022-12-30 DIAGNOSIS — Z801 Family history of malignant neoplasm of trachea, bronchus and lung: Secondary | ICD-10-CM | POA: Insufficient documentation

## 2022-12-30 DIAGNOSIS — E669 Obesity, unspecified: Secondary | ICD-10-CM | POA: Insufficient documentation

## 2022-12-30 DIAGNOSIS — Z1589 Genetic susceptibility to other disease: Secondary | ICD-10-CM

## 2022-12-30 DIAGNOSIS — Z1502 Genetic susceptibility to malignant neoplasm of ovary: Secondary | ICD-10-CM | POA: Diagnosis not present

## 2022-12-30 DIAGNOSIS — G4730 Sleep apnea, unspecified: Secondary | ICD-10-CM | POA: Insufficient documentation

## 2022-12-30 DIAGNOSIS — K219 Gastro-esophageal reflux disease without esophagitis: Secondary | ICD-10-CM | POA: Insufficient documentation

## 2022-12-30 DIAGNOSIS — Z148 Genetic carrier of other disease: Secondary | ICD-10-CM

## 2022-12-30 HISTORY — PX: ROBOTIC ASSISTED SALPINGO OOPHERECTOMY: SHX6082

## 2022-12-30 LAB — TYPE AND SCREEN
ABO/RH(D): A POS
Antibody Screen: NEGATIVE

## 2022-12-30 LAB — ABO/RH: ABO/RH(D): A POS

## 2022-12-30 SURGERY — SALPINGO-OOPHORECTOMY, ROBOT-ASSISTED
Anesthesia: General | Laterality: Bilateral

## 2022-12-30 MED ORDER — HYDROMORPHONE HCL 1 MG/ML IJ SOLN
INTRAMUSCULAR | Status: AC
  Filled 2022-12-30: qty 1

## 2022-12-30 MED ORDER — PROMETHAZINE HCL 25 MG/ML IJ SOLN: 6.2500 mg | INTRAMUSCULAR | Status: DC | PRN

## 2022-12-30 MED ORDER — OXYCODONE HCL 5 MG PO TABS
5.0000 mg | ORAL_TABLET | Freq: Once | ORAL | Status: AC | PRN
  Administered 2022-12-30: 5 mg via ORAL

## 2022-12-30 MED ORDER — BUPIVACAINE HCL 0.25 % IJ SOLN
INTRAMUSCULAR | Status: DC | PRN
  Administered 2022-12-30: 22 mL

## 2022-12-30 MED ORDER — OXYCODONE HCL 5 MG/5ML PO SOLN: 5.0000 mg | Freq: Once | ORAL | Status: AC | PRN

## 2022-12-30 MED ORDER — PHENYLEPHRINE 80 MCG/ML (10ML) SYRINGE FOR IV PUSH (FOR BLOOD PRESSURE SUPPORT)
PREFILLED_SYRINGE | INTRAVENOUS | Status: AC
  Filled 2022-12-30: qty 10

## 2022-12-30 MED ORDER — LACTATED RINGERS IV SOLN: INTRAVENOUS | Status: DC | PRN

## 2022-12-30 MED ORDER — PROPOFOL 10 MG/ML IV BOLUS
INTRAVENOUS | Status: AC
  Filled 2022-12-30: qty 20

## 2022-12-30 MED ORDER — GABAPENTIN 300 MG PO CAPS
300.0000 mg | ORAL_CAPSULE | ORAL | Status: AC
  Administered 2022-12-30: 300 mg via ORAL
  Filled 2022-12-30: qty 1

## 2022-12-30 MED ORDER — HEPARIN SODIUM (PORCINE) 5000 UNIT/ML IJ SOLN
5000.0000 [IU] | INTRAMUSCULAR | Status: AC
  Administered 2022-12-30: 5000 [IU] via SUBCUTANEOUS
  Filled 2022-12-30: qty 1

## 2022-12-30 MED ORDER — SCOPOLAMINE 1 MG/3DAYS TD PT72: 1.0000 | MEDICATED_PATCH | TRANSDERMAL | Status: DC

## 2022-12-30 MED ORDER — CHLORHEXIDINE GLUCONATE 0.12 % MT SOLN
15.0000 mL | Freq: Once | OROMUCOSAL | Status: AC
  Administered 2022-12-30: 15 mL via OROMUCOSAL

## 2022-12-30 MED ORDER — DEXAMETHASONE SODIUM PHOSPHATE 10 MG/ML IJ SOLN
INTRAMUSCULAR | Status: DC | PRN
  Administered 2022-12-30: 10 mg via INTRAVENOUS

## 2022-12-30 MED ORDER — OXYCODONE HCL 5 MG PO TABS
ORAL_TABLET | ORAL | Status: AC
  Filled 2022-12-30: qty 1

## 2022-12-30 MED ORDER — SCOPOLAMINE 1 MG/3DAYS TD PT72
1.0000 | MEDICATED_PATCH | TRANSDERMAL | Status: DC
  Administered 2022-12-30: 1.5 mg via TRANSDERMAL
  Filled 2022-12-30: qty 1

## 2022-12-30 MED ORDER — MIDAZOLAM HCL 2 MG/2ML IJ SOLN
INTRAMUSCULAR | Status: AC
  Filled 2022-12-30: qty 2

## 2022-12-30 MED ORDER — LIDOCAINE 2% (20 MG/ML) 5 ML SYRINGE
INTRAMUSCULAR | Status: DC | PRN
  Administered 2022-12-30: 30 mg via INTRAVENOUS

## 2022-12-30 MED ORDER — ROCURONIUM BROMIDE 10 MG/ML (PF) SYRINGE
PREFILLED_SYRINGE | INTRAVENOUS | Status: AC
  Filled 2022-12-30: qty 10

## 2022-12-30 MED ORDER — DEXAMETHASONE SODIUM PHOSPHATE 10 MG/ML IJ SOLN
INTRAMUSCULAR | Status: AC
  Filled 2022-12-30: qty 1

## 2022-12-30 MED ORDER — DEXAMETHASONE SODIUM PHOSPHATE 4 MG/ML IJ SOLN: 4.0000 mg | INTRAMUSCULAR | Status: DC

## 2022-12-30 MED ORDER — FENTANYL CITRATE (PF) 250 MCG/5ML IJ SOLN
INTRAMUSCULAR | Status: DC | PRN
  Administered 2022-12-30: 150 ug via INTRAVENOUS
  Administered 2022-12-30 (×2): 50 ug via INTRAVENOUS

## 2022-12-30 MED ORDER — BUPIVACAINE HCL 0.25 % IJ SOLN
INTRAMUSCULAR | Status: AC
  Filled 2022-12-30: qty 1

## 2022-12-30 MED ORDER — ACETAMINOPHEN 500 MG PO TABS: 1000.0000 mg | ORAL_TABLET | Freq: Once | ORAL | Status: DC

## 2022-12-30 MED ORDER — ONDANSETRON HCL 4 MG/2ML IJ SOLN
INTRAMUSCULAR | Status: DC | PRN
  Administered 2022-12-30: 4 mg via INTRAVENOUS

## 2022-12-30 MED ORDER — LIDOCAINE HCL (PF) 2 % IJ SOLN
INTRAMUSCULAR | Status: AC
  Filled 2022-12-30: qty 5

## 2022-12-30 MED ORDER — ACETAMINOPHEN 500 MG PO TABS
1000.0000 mg | ORAL_TABLET | ORAL | Status: AC
  Administered 2022-12-30: 1000 mg via ORAL
  Filled 2022-12-30: qty 2

## 2022-12-30 MED ORDER — ONDANSETRON HCL 4 MG/2ML IJ SOLN
INTRAMUSCULAR | Status: AC
  Filled 2022-12-30: qty 2

## 2022-12-30 MED ORDER — SUGAMMADEX SODIUM 200 MG/2ML IV SOLN
INTRAVENOUS | Status: DC | PRN
  Administered 2022-12-30: 200 mg via INTRAVENOUS

## 2022-12-30 MED ORDER — PROPOFOL 10 MG/ML IV BOLUS
INTRAVENOUS | Status: DC | PRN
  Administered 2022-12-30: 120 mg via INTRAVENOUS

## 2022-12-30 MED ORDER — LACTATED RINGERS IR SOLN
Status: DC | PRN
  Administered 2022-12-30: 1000 mL

## 2022-12-30 MED ORDER — STERILE WATER FOR IRRIGATION IR SOLN
Status: DC | PRN
  Administered 2022-12-30: 1000 mL

## 2022-12-30 MED ORDER — FENTANYL CITRATE (PF) 250 MCG/5ML IJ SOLN
INTRAMUSCULAR | Status: AC
  Filled 2022-12-30: qty 5

## 2022-12-30 MED ORDER — HYDROMORPHONE HCL 1 MG/ML IJ SOLN
0.2500 mg | INTRAMUSCULAR | Status: DC | PRN
  Administered 2022-12-30 (×4): 0.5 mg via INTRAVENOUS

## 2022-12-30 MED ORDER — ORAL CARE MOUTH RINSE: 15.0000 mL | Freq: Once | OROMUCOSAL | Status: AC

## 2022-12-30 MED ORDER — MEPERIDINE HCL 50 MG/ML IJ SOLN: 6.2500 mg | INTRAMUSCULAR | Status: DC | PRN

## 2022-12-30 MED ORDER — MIDAZOLAM HCL 2 MG/2ML IJ SOLN: 0.5000 mg | Freq: Once | INTRAMUSCULAR | Status: DC | PRN

## 2022-12-30 MED ORDER — ROCURONIUM BROMIDE 10 MG/ML (PF) SYRINGE
PREFILLED_SYRINGE | INTRAVENOUS | Status: DC | PRN
  Administered 2022-12-30: 60 mg via INTRAVENOUS

## 2022-12-30 MED ORDER — LACTATED RINGERS IV SOLN: INTRAVENOUS | Status: DC

## 2022-12-30 MED ORDER — KETOROLAC TROMETHAMINE 15 MG/ML IJ SOLN: 15.0000 mg | INTRAMUSCULAR | Status: DC

## 2022-12-30 MED ORDER — LIDOCAINE HCL 2 % IJ SOLN
INTRAMUSCULAR | Status: AC
  Filled 2022-12-30: qty 20

## 2022-12-30 MED ORDER — PHENYLEPHRINE 80 MCG/ML (10ML) SYRINGE FOR IV PUSH (FOR BLOOD PRESSURE SUPPORT)
PREFILLED_SYRINGE | INTRAVENOUS | Status: DC | PRN
  Administered 2022-12-30 (×2): 80 ug via INTRAVENOUS

## 2022-12-30 MED ORDER — MIDAZOLAM HCL 5 MG/5ML IJ SOLN
INTRAMUSCULAR | Status: DC | PRN
  Administered 2022-12-30: 2 mg via INTRAVENOUS

## 2022-12-30 SURGICAL SUPPLY — 79 items
ADH SKN CLS APL DERMABOND .7 (GAUZE/BANDAGES/DRESSINGS) ×1
AGENT HMST KT MTR STRL THRMB (HEMOSTASIS)
APL ESCP 34 STRL LF DISP (HEMOSTASIS)
APPLICATOR SURGIFLO ENDO (HEMOSTASIS) IMPLANT
BAG LAPAROSCOPIC 12 15 PORT 16 (BASKET) IMPLANT
BAG RETRIEVAL 12/15 (BASKET)
BLADE SURG SZ10 CARB STEEL (BLADE) IMPLANT
COVER BACK TABLE 60X90IN (DRAPES) ×2 IMPLANT
COVER TIP SHEARS 8 DVNC (MISCELLANEOUS) ×2 IMPLANT
DERMABOND ADVANCED .7 DNX12 (GAUZE/BANDAGES/DRESSINGS) ×2 IMPLANT
DRAPE ARM DVNC X/XI (DISPOSABLE) ×8 IMPLANT
DRAPE COLUMN DVNC XI (DISPOSABLE) ×2 IMPLANT
DRAPE SHEET LG 3/4 BI-LAMINATE (DRAPES) ×2 IMPLANT
DRAPE SURG IRRIG POUCH 19X23 (DRAPES) ×2 IMPLANT
DRIVER NDL MEGA 8 DVNC XI (INSTRUMENTS) ×4 IMPLANT
DRIVER NDLE MEGA DVNC XI (INSTRUMENTS) ×2 IMPLANT
DRSG OPSITE POSTOP 4X6 (GAUZE/BANDAGES/DRESSINGS) IMPLANT
DRSG OPSITE POSTOP 4X8 (GAUZE/BANDAGES/DRESSINGS) IMPLANT
ELECT PENCIL ROCKER SW 15FT (MISCELLANEOUS) IMPLANT
ELECT REM PT RETURN 15FT ADLT (MISCELLANEOUS) ×2 IMPLANT
FORCEPS BPLR FENES DVNC XI (FORCEP) ×2 IMPLANT
FORCEPS PROGRASP DVNC XI (FORCEP) ×2 IMPLANT
GAUZE 4X4 16PLY ~~LOC~~+RFID DBL (SPONGE) ×2 IMPLANT
GLOVE BIO SURGEON STRL SZ 6 (GLOVE) ×8 IMPLANT
GLOVE BIO SURGEON STRL SZ 6.5 (GLOVE) ×2 IMPLANT
GLOVE BIOGEL PI IND STRL 6.5 (GLOVE) ×4 IMPLANT
GOWN STRL REUS W/ TWL LRG LVL3 (GOWN DISPOSABLE) ×8 IMPLANT
GOWN STRL REUS W/TWL LRG LVL3 (GOWN DISPOSABLE) ×4
GRASPER SUT TROCAR 14GX15 (MISCELLANEOUS) IMPLANT
HOLDER FOLEY CATH W/STRAP (MISCELLANEOUS) IMPLANT
IRRIG SUCT STRYKERFLOW 2 WTIP (MISCELLANEOUS) ×1
IRRIGATION SUCT STRKRFLW 2 WTP (MISCELLANEOUS) ×2 IMPLANT
KIT PROCEDURE DVNC SI (MISCELLANEOUS) IMPLANT
KIT TURNOVER KIT A (KITS) IMPLANT
LIGASURE IMPACT 36 18CM CVD LR (INSTRUMENTS) IMPLANT
MANIPULATOR ADVINCU DEL 3.0 PL (MISCELLANEOUS) IMPLANT
MANIPULATOR ADVINCU DEL 3.5 PL (MISCELLANEOUS) IMPLANT
MANIPULATOR UTERINE 4.5 ZUMI (MISCELLANEOUS) IMPLANT
NDL HYPO 21X1.5 SAFETY (NEEDLE) ×2 IMPLANT
NDL INSUFFLATION 14GA 120MM (NEEDLE) IMPLANT
NDL SPNL 18GX3.5 QUINCKE PK (NEEDLE) IMPLANT
NEEDLE HYPO 21X1.5 SAFETY (NEEDLE) ×1 IMPLANT
NEEDLE INSUFFLATION 14GA 120MM (NEEDLE) IMPLANT
NEEDLE SPNL 18GX3.5 QUINCKE PK (NEEDLE) IMPLANT
OBTURATOR OPTICAL STND 8 DVNC (TROCAR) ×1
OBTURATOR OPTICALSTD 8 DVNC (TROCAR) ×2 IMPLANT
PACK ROBOT GYN CUSTOM WL (TRAY / TRAY PROCEDURE) ×2 IMPLANT
PAD ARMBOARD 7.5X6 YLW CONV (MISCELLANEOUS) ×2 IMPLANT
PAD POSITIONING PINK XL (MISCELLANEOUS) ×2 IMPLANT
PORT ACCESS TROCAR AIRSEAL 12 (TROCAR) IMPLANT
SCISSORS MNPLR CVD DVNC XI (INSTRUMENTS) ×2 IMPLANT
SCRUB CHG 4% DYNA-HEX 4OZ (MISCELLANEOUS) ×4 IMPLANT
SEAL UNIV 5-12 XI (MISCELLANEOUS) ×6 IMPLANT
SET TRI-LUMEN FLTR TB AIRSEAL (TUBING) ×2 IMPLANT
SPIKE FLUID TRANSFER (MISCELLANEOUS) ×2 IMPLANT
SPONGE T-LAP 18X18 ~~LOC~~+RFID (SPONGE) IMPLANT
SURGIFLO W/THROMBIN 8M KIT (HEMOSTASIS) IMPLANT
SUT MNCRL AB 4-0 PS2 18 (SUTURE) IMPLANT
SUT PDS AB 1 TP1 54 (SUTURE) IMPLANT
SUT VIC AB 0 CT1 27 (SUTURE)
SUT VIC AB 0 CT1 27XBRD ANTBC (SUTURE) IMPLANT
SUT VIC AB 2-0 CT1 27 (SUTURE)
SUT VIC AB 2-0 CT1 TAPERPNT 27 (SUTURE) IMPLANT
SUT VIC AB 4-0 PS2 18 (SUTURE) ×4 IMPLANT
SUT VICRYL 0 27 CT2 27 ABS (SUTURE) IMPLANT
SUT VLOC 180 0 9IN  GS21 (SUTURE)
SUT VLOC 180 0 9IN GS21 (SUTURE) IMPLANT
SYR 10ML LL (SYRINGE) IMPLANT
SYS BAG RETRIEVAL 10MM (BASKET) ×1
SYS WOUND ALEXIS 18CM MED (MISCELLANEOUS)
SYSTEM BAG RETRIEVAL 10MM (BASKET) IMPLANT
SYSTEM WOUND ALEXIS 18CM MED (MISCELLANEOUS) IMPLANT
TOWEL OR NON WOVEN STRL DISP B (DISPOSABLE) IMPLANT
TRAP SPECIMEN MUCUS 40CC (MISCELLANEOUS) IMPLANT
TRAY FOLEY MTR SLVR 16FR STAT (SET/KITS/TRAYS/PACK) ×2 IMPLANT
TROCAR PORT AIRSEAL 5X120 (TROCAR) IMPLANT
UNDERPAD 30X36 HEAVY ABSORB (UNDERPADS AND DIAPERS) ×4 IMPLANT
WATER STERILE IRR 1000ML POUR (IV SOLUTION) ×2 IMPLANT
YANKAUER SUCT BULB TIP 10FT TU (MISCELLANEOUS) IMPLANT

## 2022-12-30 NOTE — Interval H&P Note (Signed)
History and Physical Interval Note:  12/30/2022 7:06 AM  Diana Tate  has presented today for surgery, with the diagnosis of BRIP1 GENE MUTATION.  The various methods of treatment have been discussed with the patient and family. After consideration of risks, benefits and other options for treatment, the patient has consented to  Procedure(s): XI ROBOTIC ASSISTED SALPINGO OOPHORECTOMY (Bilateral) as a surgical intervention.  The patient's history has been reviewed, patient examined, no change in status, stable for surgery.  I have reviewed the patient's chart and labs.  Questions were answered to the patient's satisfaction.     [name]

## 2022-12-30 NOTE — Transfer of Care (Signed)
Immediate Anesthesia Transfer of Care Note  Patient: Diana Tate  Procedure(s) Performed: XI ROBOTIC ASSISTED BILATERAL SALPINGO OOPHORECTOMY (Bilateral)  Patient Location: PACU  Anesthesia Type:General  Level of Consciousness: oriented, drowsy, and patient cooperative  Airway & Oxygen Therapy: Patient Spontanous Breathing and Patient connected to face mask oxygen  Post-op Assessment: Report given to RN and Post -op Vital signs reviewed and stable  Post vital signs: Reviewed  Last Vitals:  Vitals Value Taken Time  BP 141/89 12/30/22 0909  Temp    Pulse 61 12/30/22 0914  Resp 21 12/30/22 0914  SpO2 100 % 12/30/22 0914  Vitals shown include unvalidated device data.  Last Pain:  Vitals:   12/30/22 0601  TempSrc:   PainSc: 0-No pain         Complications:  Encounter Notable Events  Notable Event Outcome Phase Comment  Difficult to intubate - expected  Intraprocedure Filed from anesthesia note documentation.

## 2022-12-30 NOTE — Op Note (Addendum)
GYNECOLOGIC ONCOLOGY OPERATIVE NOTE  Date of Service: 12/30/2022  Preoperative Diagnosis: BRIP1 GENE MUTATION  Postoperative Diagnosis: Same  Procedures: XI ROBOTIC ASSISTED BILATERAL SALPINGO OOPHORECTOMY  Surgeon: Clide Cliff, MD  Assistants: Antionette Char, MD and (an MD assistant was necessary for tissue manipulation, management of robotic instrumentation, retraction and positioning due to the complexity of the case and hospital policies)  Anesthesia: General  Estimated Blood Loss: Minimal  Fluids: 1000 ml, crystalloid  Urine Output: , clear yellow  Findings: On entry to abdomen, normal upper abdominal survey with smooth diaphragm, liver, stomach and normal appearing omentum and bowel. Adhesions of rectosigmoid colon to the left pelvic sidewall initially occluding view of the left adnexa. Once lysed, normal bilateral fallopian tubes and ovaries. Normal small anteverted uterus.   Specimens:  ID Type Source Tests Collected by Time Destination  1 : Bilateral Fallopian Tubes and Ovaries Tissue PATH Gyn benign resection SURGICAL PATHOLOGY Clide Cliff, MD 12/30/2022 [phone]   A : Pelvic Washings Body Fluid PATH Cytology Pelvic Washing CYTOLOGY - NON PAP Clide Cliff, MD 12/30/2022 [phone]     Complications:  None  Indications for Procedure: Diana Tate is a 52 y.o. woman with a BRIP1 mutation and personal history of breast cancer.  Prior to the procedure, all risks, benefits, and alternatives were discussed and informed surgical consent was signed.  Procedure: Patient was taken to the operating room where general anesthesia was achieved.  She was positioned in dorsal lithotomy and prepped and draped.  A foley catheter was inserted into the bladder.  A hulka uterine manipulator was inserted to the uterus.    A 12 mm incision was made in the left upper quadrant near Palmer's point.  The abdomen was entered with a 5 mm OptiView trocar under direct visualization.   The abdomen was insufflated, the patient placed in steep Trendelenburg, and additional trocars were placed as follows: an 8mm robotic trocar superior to the umbilicus, one 8 mm robotic trocar in the right abdomen, and one 8 mm robotic trocar in the left abdomen.  The left upper quadrant trocar was removed and replaced with a 12 mm airseal trocar.  All trocars were placed under direct visualization.  The bowels were moved into the upper abdomen.  The DaVinci robotic surgical system was brought to the patient's bedside and docked.  Washings were collected. The right retroperitoneum entered.  The right ureter was identified.  The right infundibulopelvic ligament was isolated, cauterized, and transected.  The broad ligament was incised to the uterine cornu.  The utero-ovarian ligament and the proximal fallopian tube were isolated, cauterized, and transected.  The same procedure was performed on the contralateral side. Adhesions of the rectosigmoid colon to the left pelvic sidewall were lysed with scissors. Both specimens were placed into an Endo-Catch bag and removed through the 10 mm trocar.  The pelvis was irrigated and all operative sites were found to be hemostatic.  All instruments were removed and the robot was taken from the patient's bedside. The fascia at the 12 mm incision was closed with 0 Vicryl with the assistance of a PMI device. The abdomen was desufflated and all ports were removed. The skin at all incisions was closed with 4-0 Vicryl to reapproximate the subcutaneous tissue and 4-0 monocryl in a subcuticular fashion followed by surgical glue.  Patient tolerated the procedure well. Sponge, lap, and instrument counts were correct.  No perioperative antibiotic prophylaxis was indicated for this procedure.  She was extubated and taken to the PACU  in stable condition.  Clide Cliff, MD Gynecologic Oncology

## 2022-12-30 NOTE — Anesthesia Postprocedure Evaluation (Signed)
Anesthesia Post Note  Patient: Diana Tate  Procedure(s) Performed: XI ROBOTIC ASSISTED BILATERAL SALPINGO OOPHORECTOMY (Bilateral)     Patient location during evaluation: Phase II Anesthesia Type: General Level of consciousness: awake and alert, patient cooperative and oriented Pain management: pain level controlled Vital Signs Assessment: post-procedure vital signs reviewed and stable Respiratory status: spontaneous breathing, nonlabored ventilation and respiratory function stable Cardiovascular status: blood pressure returned to baseline and stable Postop Assessment: no apparent nausea or vomiting, able to ambulate and adequate PO intake Anesthetic complications: yes   Encounter Notable Events  Notable Event Outcome Phase Comment  Difficult to intubate - expected  Intraprocedure Filed from anesthesia note documentation.    Last Vitals:  Vitals:   12/30/22 1045 12/30/22 1100  BP: 103/74 105/69  Pulse: 76 66  Resp: 12 16  Temp:    SpO2: 92% 92%    Last Pain:  Vitals:   12/30/22 1100  TempSrc:   PainSc: 0-No pain                 Perris Conwell,E. Wiley Flicker

## 2022-12-30 NOTE — Anesthesia Procedure Notes (Signed)
Procedure Name: Intubation Date/Time: 12/30/2022 7:44 AM  Performed by: Lovie Chol, CRNAPre-anesthesia Checklist: Patient identified, Emergency Drugs available, Suction available and Patient being monitored Patient Re-evaluated:Patient Re-evaluated prior to induction Oxygen Delivery Method: Circle System Utilized Preoxygenation: Pre-oxygenation with 100% oxygen Induction Type: IV induction Ventilation: Mask ventilation without difficulty Laryngoscope Size: Glidescope and 3 Grade View: Grade II Tube type: Oral Tube size: 7.0 mm Number of attempts: 1 Airway Equipment and Method: Stylet and Video-laryngoscopy Placement Confirmation: ETT inserted through vocal cords under direct vision, positive ETCO2 and breath sounds checked- equal and bilateral Secured at: 21 cm Tube secured with: Tape Dental Injury: Teeth and Oropharynx as per pre-operative assessment  Difficulty Due To: Difficulty was anticipated, Difficult Airway- due to large tongue and Difficult Airway- due to anterior larynx

## 2022-12-31 ENCOUNTER — Encounter (HOSPITAL_COMMUNITY): Payer: Self-pay | Admitting: Psychiatry

## 2022-12-31 ENCOUNTER — Telehealth: Payer: Self-pay | Admitting: *Deleted

## 2022-12-31 LAB — SURGICAL PATHOLOGY

## 2022-12-31 NOTE — Telephone Encounter (Signed)
Spoke with Diana Tate this morning. She states she is eating, drinking and urinating well. She has not had a BM yet but is passing a little gas. She is taking senokot as prescribed and encouraged her to drink plenty of water. She denies fever or chills. Incisions are dry and intact. She rates her pain 9/10. Her pain is controlled with Oxycodone and rates it a 5/10 after medication. Has no pain when sitting still, just when ambulating.    Instructed to call office with any fever, chills, purulent drainage, uncontrolled pain or any other questions or concerns. Patient verbalizes understanding.   Pt aware of post op appointments as well as the office number [phone] and after hours number [phone] to call if she has any questions or concerns

## 2023-01-01 LAB — CYTOLOGY - NON PAP

## 2023-01-14 ENCOUNTER — Encounter: Payer: BC Managed Care – PPO | Admitting: Psychiatry

## 2023-01-30 ENCOUNTER — Encounter: Payer: Self-pay | Admitting: Psychiatry

## 2023-02-02 ENCOUNTER — Other Ambulatory Visit: Payer: Self-pay

## 2023-02-02 ENCOUNTER — Inpatient Hospital Stay: Payer: BC Managed Care – PPO | Attending: Psychiatry | Admitting: Psychiatry

## 2023-02-02 VITALS — BP 126/84 | HR 88 | Temp 98.7°F | Ht 65.75 in | Wt 197.8 lb

## 2023-02-02 DIAGNOSIS — Z7189 Other specified counseling: Secondary | ICD-10-CM

## 2023-02-02 DIAGNOSIS — Z90722 Acquired absence of ovaries, bilateral: Secondary | ICD-10-CM

## 2023-02-02 DIAGNOSIS — Z1501 Genetic susceptibility to malignant neoplasm of breast: Secondary | ICD-10-CM

## 2023-02-02 DIAGNOSIS — Z1502 Genetic susceptibility to malignant neoplasm of ovary: Secondary | ICD-10-CM

## 2023-02-02 NOTE — Progress Notes (Signed)
Gynecologic Oncology Return Clinic Visit  Date of Service: 02/02/2023 Referring Provider: Richardean Chimera, MD   Assessment & Plan: Diana Tate is a 52 y.o. woman with BRIP mutation who is s/p RA-BSO on 12/30/22.  Postop: - Pt recovering well from surgery and healing appropriately postoperatively - Intraoperative findings and pathology results reviewed. - Ongoing postoperative expectations and precautions reviewed. Restrictions lifted. Gradually reintroduce activities. - Okay to resume routine Ob/Gyn care. - Reviewed that it is possible that surgical positioning impacted her back, but timeline does not quite fit given that she reports her back pain started a week after surgery. No intraoperative concerns or positioning challenges during case.   RTC PRN.  Clide Cliff, MD Gynecologic Oncology    ----------------------- Reason for Visit: Postop  Treatment History: Oncology History  Ductal carcinoma in situ (DCIS) of right breast  07/17/2021 Mammogram   CLINICAL DATA:  One year follow-up of probably benign right breast calcifications.   The patient also states that she has had a spontaneous yellow to blood-tinged right nipple discharge for several months.   EXAM: DIGITAL DIAGNOSTIC BILATERAL MAMMOGRAM WITH TOMOSYNTHESIS AND CAD; ULTRASOUND RIGHT BREAST LIMITED  IMPRESSION: Right breast 9:30 o'clock ill-defined sonographically seen mass, which corresponds to an area of thickening on physical exam, for which ultrasound-guided core needle biopsy is recommended. Attention on postprocedural mammogram is recommended to establish whether this abnormality corresponds to the focal asymmetry seen mammographically the right breast upper outer quadrant, posterior depth.   Two groups of probably benign calcifications in the right breast upper outer quadrant, waxing and waning in nature, likely representing secretory calcifications associated with micro and macro cysts.    07/26/2021 Initial Biopsy   Diagnosis Breast, right, needle core biopsy, upper outer quadrant - DUCTAL CARCINOMA IN SITU - SEE COMMENT Microscopic Comment Based on the biopsy, the ductal carcinoma in situ has a solid pattern, intermediate nuclear grade and measures 0.6 cm  PROGNOSTIC INDICATORS Results: Estrogen Receptor: 95%, POSITIVE, MODERATE STAINING INTENSITY Progesterone Receptor: 100%, POSITIVE, STRONG STAINING INTENSITY   08/02/2021 Pathology Results   Diagnosis 1. Breast, right, needle core biopsy, lower outer quadrant ,anterior depth - DUCTAL CARCINOMA IN SITU WITH CALCIFICATIONS AND NECROSIS - SEE COMMENT 2. Breast, right, needle core biopsy, lower outer quadrant, posterior depth - DUCTAL CARCINOMA IN SITU WITH CALCIFICATIONS - SEE COMMENT 3. Breast, left, needle core biopsy, upper outer quadrant - DUCTAL CARCINOMA IN SITU - SEE COMMENT Microscopic Comment 1. The carcinoma in situ appears to partially be involving a papillary lesion. Based on the biopsy, the ductal carcinoma in situ has a comedo pattern, high nuclear grade and measures 0.4 cm in greatest linear extent.  3. Cytokeratin 5/6 is negative and ER strongly positive in the focus of ductal carcinoma in situ. Based on the biopsy, the ductal carcinoma in situ has a comedo-like pattern, intermediate nuclear grade and measures 0.1 cm in greatest linear extent.   1. PROGNOSTIC INDICATORS Results: Estrogen Receptor: 95%, POSITIVE, MODERATAE STAINING INTENSITY Progesterone Receptor: 100%, POSITIVE, STRONG STAINING INTENSITY  3. PROGNOSTIC INDICATORS Results: Estrogen Receptor: 95%, POSITIVE, MODERATE STAINING INTENSITY Progesterone Receptor: >95%, POSITIVE, STRONG STAINING INTENSITY   08/09/2021 Initial Diagnosis   Ductal carcinoma in situ (DCIS) of right breast   09/16/2021 Definitive Surgery   FINAL MICROSCOPIC DIAGNOSIS:   A. BREAST, RIGHT, MASTECTOMY:  - Three foci of ductal carcinoma in situ,  intermediate grade, with focal necrosis, measuring 2.1 cm (cylindrical clip), 1.3 cm (ribbon clip) and 1.6 cm (barbell clip)  - All resection  margins are remote from DCIS  - No evidence of invasive carcinoma  - Benign unremarkable nipple  - Fibrocystic change  - Biopsy site changes  - See oncology table   B. BREAST, LEFT, MASTECTOMY:  - Ductal carcinoma in situ, intermediate grade, 1.2 cm (barbell clip)  - Resection margins are negative for DCIS  - No evidence of invasive carcinoma  - Benign unremarkable nipple  - Biopsy site changes  - See oncology table   C. LYMPH NODE, RIGHT AXILLARY #1, SENTINEL, EXCISION:  - Lymph node, negative for carcinoma (0/1)   D. LYMPH NODE, RIGHT AXILLARY, SENTINEL, EXCISION:  - Lymph node, negative for carcinoma (0/1)   E. LYMPH NODE, RIGHT AXILLARY, SENTINEL, EXCISION:  - Lymph node, negative for carcinoma (0/1)   F. LYMPH NODE, RIGHT AXILLARY, SENTINEL, EXCISION:  - Lymph node, negative for carcinoma (0/1)   G. LYMPH NODE, RIGHT AXILLARY, SENTINEL, EXCISION:  - Lymph node, negative for carcinoma (0/1)   H. LYMPH NODE, RIGHT AXILLARY #2, SENTINEL, EXCISION:  - Lymph node, negative for carcinoma (0/1)   I. LYMPH NODE, LEFT AXILLARY #1, SENTINEL, EXCISION:  - Lymph node, negative for carcinoma (0/1)   J. LYMPH NODE, LEFT AXILLARY, SENTINEL, EXCISION:  - Lymph node, negative for carcinoma (0/1)   K. LYMPH NODE, LEFT AXILLARY, SENTINEL, EXCISION:  - Lymph node, negative for carcinoma (0/1)   L. LYMPH NODE, LEFT AXILLARY, SENTINEL, EXCISION:  - Lymph node, negative for carcinoma (0/1)   M. LYMPH NODE, LEFT AXILLARY #2, SENTINEL, EXCISION:  - Lymph node, negative for carcinoma (0/1)   N. LYMPH NODE, LEFT AXILLARY #3, SENTINEL, EXCISION:  - Lymph node, negative for carcinoma (0/1)      Interval History: Pt reports that she is recovering well from surgery. No pain from surgery. She is eating and drinking well. She is voiding without issue  and having regular bowel movements. About a week after surgery she began experiencing back pain. She was evaluated by her chiropractor who recommended referral for steroid injection MRI which noted a bulging disk. She was given flexeril and NSAIDs. She is still currently experiencing pain.    Past Medical/Surgical History: Past Medical History:  Diagnosis Date   Cancer (HCC)    Breast DCIS   GERD (gastroesophageal reflux disease)    Hyperlipidemia    Migraine    Classic   Pre-diabetes    Sciatic nerve pain 2024   Ruptured disc in back   Sleep apnea    wearing snore guard at the time    Past Surgical History:  Procedure Laterality Date   AXILLARY SENTINEL NODE BIOPSY Bilateral 09/16/2021   Procedure: BILATERAL AXILLARY SENTINEL NODE BIOPSY;  Surgeon: Griselda Miner, MD;  Location: [facility] OR;  Service: General;  Laterality: Bilateral;   BREAST BIOPSY Right 08/02/2021   2 areas   BREAST BIOPSY Left 08/02/2021   BREAST EXCISIONAL BIOPSY Right 04/2012   BREAST RECONSTRUCTION WITH PLACEMENT OF TISSUE EXPANDER AND FLEX HD (ACELLULAR HYDRATED DERMIS) Bilateral 09/16/2021   Procedure: BREAST RECONSTRUCTION WITH PLACEMENT OF TISSUE EXPANDER AND FLEX HD (ACELLULAR HYDRATED DERMIS);  Surgeon: Peggye Form, DO;  Location: [facility] OR;  Service: Plastics;  Laterality: Bilateral;   BREAST SURGERY     benign   CHOLECYSTECTOMY  2006   ENDOMETRIAL ABLATION  2007   ESOPHAGOGASTRODUODENOSCOPY N/A 05/22/2014   SLF: 1. Schatzki ring was found 2. MIld non-erosive gastritis   REMOVAL OF BILATERAL TISSUE EXPANDERS WITH PLACEMENT OF BILATERAL BREAST IMPLANTS Bilateral 12/05/2021  Procedure: REMOVAL OF BILATERAL TISSUE EXPANDERS WITH PLACEMENT OF BILATERAL BREAST IMPLANTS;  Surgeon: Peggye Form, DO;  Location: [facility];  Service: Plastics;  Laterality: Bilateral;  2 hours   ROBOTIC ASSISTED SALPINGO OOPHERECTOMY Bilateral 12/30/2022   Procedure: XI ROBOTIC ASSISTED BILATERAL SALPINGO  OOPHORECTOMY;  Surgeon: Clide Cliff, MD;  Location: [facility];  Service: Gynecology;  Laterality: Bilateral;   TONSILLECTOMY  2000   TOTAL MASTECTOMY Bilateral 09/16/2021   Procedure: BILATERAL TOTAL MASTECTOMY;  Surgeon: Griselda Miner, MD;  Location: [facility] OR;  Service: General;  Laterality: Bilateral;    Family History  Problem Relation Age of Onset   Hypertension Mother    Lung cancer Mother    Cancer Maternal Grandfather        skin   Lung cancer Maternal Uncle    Diabetes Other        second degree relative   Colon cancer Neg Hx    Esophageal cancer Neg Hx    Breast cancer Neg Hx    Ovarian cancer Neg Hx    Endometrial cancer Neg Hx     Social History   Socioeconomic History   Marital status: Married    Spouse name: Not on file   Number of children: 2   Years of education: Not on file   Highest education level: Not on file  Occupational History   Occupation: Museum/gallery exhibitions officer: [employer]  Tobacco Use   Smoking status: Former    Packs/day: 1.00    Years: 24.00    Additional pack years: 0.00    Total pack years: 24.00    Types: Cigarettes    Quit date: 2017    Years since quitting: 7.4   Smokeless tobacco: Never   Tobacco comments:    Quit x 2 years  Vaping Use   Vaping Use: Never used  Substance and Sexual Activity   Alcohol use: No    Alcohol/week: 0.0 standard drinks of alcohol    Comment: occ   Drug use: No   Sexual activity: Yes    Birth control/protection: None, Surgical  Other Topics Concern   Not on file  Social History Narrative   Not on file   Social Determinants of Health   Financial Resource Strain: Not on file  Food Insecurity: Not on file  Transportation Needs: Not on file  Physical Activity: Not on file  Stress: Not on file  Social Connections: Not on file    Current Medications:  Current Outpatient Medications:    ibuprofen (ADVIL) 800 MG tablet, Take 1 tablet (800 mg total) by mouth every 8 (eight)  hours as needed for moderate pain. For AFTER surgery only, Disp: 30 tablet, Rfl: 0   OVER THE COUNTER MEDICATION, Take 5 drops by mouth 3 (three) times daily. Appetite suppressant supplement, Disp: , Rfl:    oxyCODONE (OXY IR/ROXICODONE) 5 MG immediate release tablet, Take 1 tablet (5 mg total) by mouth every 4 (four) hours as needed for severe pain. For AFTER surgery only, do not take and drive, Disp: 10 tablet, Rfl: 0   pantoprazole (PROTONIX) 40 MG tablet, Take 40 mg by mouth daily., Disp: , Rfl:    Nutritional Supplements (LIPOTROPIC COMPLEX PO), Take 50 mLs by mouth 3 (three) times daily. Oral Drops [facility] (Patient not taking: Reported on 01/30/2023), Disp: , Rfl:    senna-docusate (SENOKOT-S) 8.6-50 MG tablet, Take 2 tablets by mouth at bedtime. For AFTER  surgery, do not take if having diarrhea (Patient not taking: Reported on 01/30/2023), Disp: 30 tablet, Rfl: 0  Review of Symptoms: Complete 10-system review is positive for: joint pain, back pain, numbness  Physical Exam: LMP 01/16/2009 (Approximate) Comment: Patient states her MD did bloodwork in October 2022 and told patient she is postmenopausal. Patient also had an ablation. Last menstrual cycle was 13 years ago. General: Alert, oriented, no acute distress. HEENT: Normocephalic, atraumatic. Neck symmetric without masses. Sclera anicteric.  Chest: Normal work of breathing. Clear to auscultation bilaterally.   Cardiovascular: Regular rate and rhythm, no murmurs. Abdomen: Soft, nontender.  Normoactive bowel sounds.  No masses appreciated.  Well-healing incisions. Extremities: Grossly normal range of motion.  Warm, well perfused.  No edema bilaterally. Skin: No rashes or lesions noted.  Laboratory & Radiologic Studies: FINAL MICROSCOPIC DIAGNOSIS:   A. FALLOPIAN TUBES AND OVARIES, BILATERAL, SALPINGO OOPHORECTOMY:   Bilateral ovaries:            Benign ovarian parenchyma with inclusion cysts.            Negative  for neoplasm or malignancy.        Bilateral fallopian tubes:            Benign fimbriated fallopian tubes with paratubal cysts.            Negative for neoplasm or malignancy.   Cytology: FINAL MICROSCOPIC DIAGNOSIS:  - Reactive mesothelial cells present

## 2023-02-02 NOTE — Patient Instructions (Signed)
It was a pleasure to see you in clinic today. - Restrictions lifting (ease back into activities)  Thank you very much for allowing me to provide care for you today.  I appreciate your confidence in choosing our Gynecologic Oncology team at [facility].  If you have any questions about your visit today please call our office or send [place] a MyChart message and we will get back to you as soon as possible.

## 2023-02-08 ENCOUNTER — Encounter: Payer: Self-pay | Admitting: Psychiatry

## 2023-05-26 ENCOUNTER — Other Ambulatory Visit: Payer: Self-pay | Admitting: Obstetrics and Gynecology

## 2023-05-26 DIAGNOSIS — Z720 Tobacco use: Secondary | ICD-10-CM

## 2023-06-01 ENCOUNTER — Encounter: Payer: Self-pay | Admitting: Obstetrics and Gynecology

## 2023-06-12 ENCOUNTER — Ambulatory Visit
Admission: RE | Admit: 2023-06-12 | Discharge: 2023-06-12 | Disposition: A | Discharge status: Home / Self Care | Payer: BC Managed Care – PPO | Source: Ambulatory Visit | Attending: Obstetrics and Gynecology | Admitting: Obstetrics and Gynecology

## 2023-06-12 DIAGNOSIS — Z720 Tobacco use: Secondary | ICD-10-CM

## 2024-01-06 NOTE — Progress Notes (Signed)
 HPI: 53 year old female, former smoker (24-pack-year history).  Past medical history significant for obstructive sleep apnea esophageal reflux, history of COVID-19.  Patient of Dr. Gaynell Keeler, last seen by pulmonary nurse practitioner on 11/21/2019 for virtual televisit.  Home sleep study on 09/09/2019 showed moderate obstructive sleep apnea, AHI 23.8/hr with SPO2 low 71%.  Maintained on auto CPAP 5 to 15 cm H2O. HST 09/09/19- AHI 23.8/hr, desat to 71%, body weight 201 lbs  12/07/2020 - Interim hx  Diana Emerald, NP Patient presents 1 year follow-up OSA. She is compliant with CPAP. She struggles to wake up in the morning. Feels no better since starting CPAP therapy. She is sleeping through the night. She is wondering if she could try a different mask. She started out with nasal mask with chin strap but this was not able to keep her mouth closed. She is using hybrid full face mask but continues to swallow a lot of air causing gas/bloating in the morning. [place] 12.   =============================================================================================== 01/07/24- 52 yoF former smoker followed for OSA as above, complicated by esophageal reflux, history of COVID-19, hx Breast Cancer/ mastectomy/XRT/ Reconstruction, COPD,  HST 09/09/19- AHI 23.8/hr, desat to 71%, body weight 201 lbs CPAP auto 5-15/ [employer]  ordered 09/15/19 Download compliance-90%, AHI 7.9/hr      Pressure centers around 14.9 Body weight today- -----Sleep Consult-wears CPAP, c/o gets up so tired, not sleeping well Discussed the use of AI scribe software for clinical note transcription with the patient, who gave verbal consent to proceed. OSA, COPD History of Present Illness   Diana Tate is a 53 year old female with sleep apnea who presents with persistent fatigue and sleep disturbances.  She experiences persistent fatigue and sleep disturbances despite using a CPAP machine. Her CPAP is set between five and fifteen centimeters of water   pressure, with most usage around fourteen point nine. She experiences breakthrough apneas, averaging just under eight per hour, and feels the same whether using the machine or not. She has issues with her mask, which does not provide a good seal, allowing air to escape.  Her fatigue has worsened over the past six to eight months, significantly affecting her afternoons and evenings. She dozes off at work in warm, quiet environments. She does not nap due to work commitments and consumes one to two cups of caffeine daily. Trazodone was previously ineffective for sleep. She has noted fatigue especially since oophorectomy. She has also had bilateral mastectomy.  She quit smoking in 2017 after a history of smoking a pack a day. Lung scans have shown emphysema, but she does not experience coughing or wheezing. She is concerned about her lung health due to family history and her own emphysema diagnosis.    CT chest LDS- 06/12/23 MPRESSION: 1. Lung-RADS 2, benign appearance or behavior. Continue annual screening with low-dose chest CT without contrast in 12 months. 2.  Emphysema (ICD10-J43.9).  ,hmed Past Medical History:  Diagnosis Date   Cancer (HCC)    Breast DCIS   GERD (gastroesophageal reflux disease)    Hyperlipidemia    Migraine    Classic   Pre-diabetes    Sciatic nerve pain 2024   Ruptured disc in back   Sleep apnea    wearing snore guard at the time   .pssh / Family History  Problem Relation Age of Onset   Hypertension Mother    Lung cancer Mother    Cancer Maternal Grandfather        skin   Lung cancer Maternal Uncle  Diabetes Other        second degree relative   Colon cancer Neg Hx    Esophageal cancer Neg Hx    Breast cancer Neg Hx    Ovarian cancer Neg Hx    Endometrial cancer Neg Hx    Social History   Socioeconomic History   Marital status: Married    Spouse name: Not on file   Number of children: 2   Years of education: Not on file   Highest education  level: Not on file  Occupational History   Occupation: Museum/gallery exhibitions officer: [employer]  Tobacco Use   Smoking status: Former    Current packs/day: 0.00    Average packs/day: 1 pack/day for 24.0 years (24.0 ttl pk-yrs)    Types: Cigarettes    Start date: 38    Quit date: 2017    Years since quitting: 8.4   Smokeless tobacco: Never   Tobacco comments:    Quit x 2 years  Vaping Use   Vaping status: Never Used  Substance and Sexual Activity   Alcohol use: No    Alcohol/week: 0.0 standard drinks of alcohol    Comment: occ   Drug use: No   Sexual activity: Yes    Birth control/protection: None, Surgical  Other Topics Concern   Not on file  Social History Narrative   Not on file   Social Drivers of Health   Financial Resource Strain: Patient Declined (12/09/2023)   Received [facility]   Overall Financial Resource Strain (CARDIA)    Difficulty of Paying Living Expenses: Patient declined  Food Insecurity: Patient Declined (12/09/2023)   Received from [facility]   Hunger Vital Sign    Worried About Running Out of Food in the Last Year: Patient declined    Ran Out of Food in the Last Year: Patient declined  Transportation Needs: Patient Declined (12/09/2023)   Received from [facility] - Transportation    Lack of Transportation (Medical): Patient declined    Lack of Transportation (Non-Medical): Patient declined  Physical Activity: Insufficiently Active (12/09/2023)   Received from [facility]   Exercise Vital Sign    Days of Exercise per Week: 2 days    Minutes of Exercise per Session: 20 min  Stress: Patient Declined (12/09/2023)   Received from [facility] - Occupational Stress Questionnaire    Feeling of Stress : Patient declined  Social Connections: Moderately Integrated (12/09/2023)   Received from [facility]   Social Network    How would you rate your social network  (family, work, friends)?: Adequate participation with social networks  Intimate Partner Violence: Not At Risk (12/09/2023)   Received from [facility]   HITS    Over the last 12 months how often did your partner physically hurt you?: Never    Over the last 12 months how often did your partner insult you or talk down to you?: Never    Over the last 12 months how often did your partner threaten you with physical harm?: Never    Over the last 12 months how often did your partner scream or curse at you?: Never   Assessment and Plan:    Obstructive Sleep Apnea Current CPAP settings inadequate with breakthrough apneas. Mask seal likely ineffective, contributing to suboptimal therapy. Discussed increasing CPAP settings and refitting mask. - Request ADAPT to service CPAP machine. - Adjust CPAP settings  to maximum 20 cm H2O. - Arrange CPAP mask refitting for improved seal and comfort.  Fatigue Chronic fatigue persists despite CPAP therapy. Discussed potential benefit of sleep medication. - Prescribe temazepam  at low dose for sleep, use as needed, monitor effects.  Emphysema Mild centrilobular emphysema, asymptomatic. Discussed potential progression and need for pulmonary function test. - Order pulmonary function test to assess lung function and correlate with imaging.  Menopausal Symptoms Post-hysterectomy hot flashes and sleep disturbances consistent with menopause, contributing to fatigue.  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  Follow-up Plan to monitor and adjust treatment based on response to interventions. - Monitor response to CPAP adjustments and new sleep medication. - Follow up after pulmonary function test results to discuss emphysema management.     ROS-see HPI   + = positive Constitutional:    weight loss, night sweats, fevers, chills, fatigue, lassitude. HEENT:    headaches, difficulty  swallowing, tooth/dental problems, sore throat,       sneezing, itching, ear ache, nasal congestion, post nasal drip, snoring CV:    chest pain, orthopnea, PND, swelling in lower extremities, anasarca,                                   dizziness, palpitations Resp:   shortness of breath with exertion or at rest.                productive cough,   non-productive cough, coughing up of blood.              change in color of mucus.  wheezing.   Skin:    rash or lesions. GI:  No-   heartburn, indigestion, abdominal pain, nausea, vomiting, diarrhea,                 change in bowel habits, loss of appetite GU: dysuria, change in color of urine, no urgency or frequency.   flank pain. MS:   joint pain, stiffness, decreased range of motion, back pain. Neuro-     nothing unusual Psych:  change in mood or affect.  depression or anxiety.   memory loss.  OBJ- Physical Exam General- Alert, Oriented, Affect-appropriate, Distress- none acute, + overweight Skin- rash-none, lesions- none, excoriation- none Lymphadenopathy- none Head- atraumatic            Eyes- Gross vision intact, PERRLA, conjunctivae and secretions clear            Ears- Hearing, canals-normal            Nose- Clear, no-Septal dev, mucus, polyps, erosion, perforation             Throat- Mallampati III , mucosa clear , drainage- none, tonsils- atrophic, + teeth Neck- flexible , trachea midline, no stridor , thyroid nl, carotid no bruit Chest - symmetrical excursion , unlabored           Heart/CV- RRR , no murmur , no gallop  , no rub, nl s1 s2                           - JVD- none , edema- none, stasis changes- none, varices- none           Lung- clear to P&A, wheeze- none, cough- none , dullness-none, rub- none  Chest wall-  Abd-  Br/ Gen/ Rectal- Not done, not indicated Extrem- cyanosis- none, clubbing, none, atrophy- none, strength- nl Neuro- grossly intact to observation

## 2024-01-07 ENCOUNTER — Ambulatory Visit: Payer: Self-pay | Admitting: Internal Medicine

## 2024-01-07 ENCOUNTER — Encounter: Payer: Self-pay | Admitting: Internal Medicine

## 2024-01-07 VITALS — BP 108/62 | HR 68 | Temp 97.7°F | Ht 65.0 in | Wt 221.6 lb

## 2024-01-07 DIAGNOSIS — Z87891 Personal history of nicotine dependence: Secondary | ICD-10-CM

## 2024-01-07 DIAGNOSIS — R5382 Chronic fatigue, unspecified: Secondary | ICD-10-CM

## 2024-01-07 DIAGNOSIS — Z90710 Acquired absence of both cervix and uterus: Secondary | ICD-10-CM

## 2024-01-07 DIAGNOSIS — G4733 Obstructive sleep apnea (adult) (pediatric): Secondary | ICD-10-CM

## 2024-01-07 DIAGNOSIS — Z9013 Acquired absence of bilateral breasts and nipples: Secondary | ICD-10-CM

## 2024-01-07 DIAGNOSIS — J439 Emphysema, unspecified: Secondary | ICD-10-CM | POA: Diagnosis not present

## 2024-01-07 DIAGNOSIS — J449 Chronic obstructive pulmonary disease, unspecified: Secondary | ICD-10-CM

## 2024-01-07 DIAGNOSIS — Z853 Personal history of malignant neoplasm of breast: Secondary | ICD-10-CM

## 2024-01-07 MED ORDER — TEMAZEPAM 15 MG PO CAPS: 15.0000 mg | ORAL_CAPSULE | Freq: Every evening | ORAL | 0 refills | Status: DC | PRN

## 2024-01-07 NOTE — Patient Instructions (Signed)
 Order- [facility]- please service CPAP machine - won't cut off. Please change autopap range to 5-20. Please refit mask of choice face to face for better comfort.   Order- schedule PFT    dx COPD mixed type  Script sent to try temazepam - 1 at bedtime as needed for sleep

## 2024-01-21 ENCOUNTER — Ambulatory Visit: Payer: Self-pay | Admitting: Internal Medicine

## 2024-01-27 ENCOUNTER — Encounter: Payer: Self-pay | Admitting: Internal Medicine

## 2024-02-04 ENCOUNTER — Ambulatory Visit: Payer: Self-pay | Admitting: Internal Medicine

## 2024-03-15 ENCOUNTER — Ambulatory Visit: Payer: Self-pay | Admitting: Internal Medicine

## 2024-03-15 ENCOUNTER — Encounter: Payer: Self-pay | Admitting: Internal Medicine

## 2024-03-15 ENCOUNTER — Ambulatory Visit: Admitting: Internal Medicine

## 2024-03-15 VITALS — BP 127/82 | HR 96 | Temp 98.0°F | Ht 65.0 in | Wt 218.0 lb

## 2024-03-15 DIAGNOSIS — Z87891 Personal history of nicotine dependence: Secondary | ICD-10-CM | POA: Diagnosis not present

## 2024-03-15 DIAGNOSIS — J439 Emphysema, unspecified: Secondary | ICD-10-CM

## 2024-03-15 DIAGNOSIS — G4733 Obstructive sleep apnea (adult) (pediatric): Secondary | ICD-10-CM | POA: Diagnosis not present

## 2024-03-15 DIAGNOSIS — J449 Chronic obstructive pulmonary disease, unspecified: Secondary | ICD-10-CM

## 2024-03-15 LAB — PULMONARY FUNCTION TEST
DL/VA % pred: 98 %
DL/VA: 4.16 ml/min/mmHg/L
DLCO unc % pred: 94 %
DLCO unc: 20.37 ml/min/mmHg
FEF 25-75 Post: 3.84 L/s
FEF 25-75 Pre: 3.64 L/s
FEF2575-%Change-Post: 5 %
FEF2575-%Pred-Post: 142 %
FEF2575-%Pred-Pre: 134 %
FEV1-%Change-Post: 2 %
FEV1-%Pred-Post: 108 %
FEV1-%Pred-Pre: 105 %
FEV1-Post: 3.06 L
FEV1-Pre: 3 L
FEV1FVC-%Change-Post: 2 %
FEV1FVC-%Pred-Pre: 103 %
FEV6-%Change-Post: 0 %
FEV6-%Pred-Post: 102 %
FEV6-%Pred-Pre: 103 %
FEV6-Post: 3.6 L
FEV6-Pre: 3.61 L
FEV6FVC-%Change-Post: 0 %
FEV6FVC-%Pred-Post: 102 %
FEV6FVC-%Pred-Pre: 102 %
FVC-%Change-Post: 0 %
FVC-%Pred-Post: 99 %
FVC-%Pred-Pre: 100 %
FVC-Post: 3.6 L
FVC-Pre: 3.63 L
Post FEV1/FVC ratio: 85 %
Post FEV6/FVC ratio: 100 %
Pre FEV1/FVC ratio: 83 %
Pre FEV6/FVC Ratio: 100 %
RV % pred: 110 %
RV: 2.09 L
TLC % pred: 107 %
TLC: 5.59 L

## 2024-03-15 NOTE — Patient Instructions (Signed)
Glad you are doing well. Please let us know if we can help

## 2024-03-15 NOTE — Progress Notes (Signed)
 HPI: Female, former smoker (24-pack-year history).  Past medical history significant for obstructive sleep apnea, esophageal reflux, history of COVID-19.  HST 09/09/19- AHI 23.8/hr, desat to 71%, body weight 201 lbs PFT 03/15/24- WNL no response to BD  =============================================================================================== 01/07/24- 52 yoF former smoker followed for OSA as above, complicated by esophageal reflux, history of COVID-19, hx Breast Cancer/ mastectomy/XRT/ Reconstruction, COPD,  HST 09/09/19- AHI 23.8/hr, desat to 71%, body weight 201 lbs CPAP auto 5-15/ [facility]  ordered 09/15/19 Download compliance-90%, AHI 7.9/hr      Pressure centers around 14.9 Body weight today- -----Sleep Consult-wears CPAP, c/o gets up so tired, not sleeping well Discussed the use of AI scribe software for clinical note transcription with the patient, who gave verbal consent to proceed. OSA, COPD History of Present Illness   Diana Tate is a 53 year old female with sleep apnea who presents with persistent fatigue and sleep disturbances.  She experiences persistent fatigue and sleep disturbances despite using a CPAP machine. Her CPAP is set between five and fifteen centimeters of water  pressure, with most usage around fourteen point nine. She experiences breakthrough apneas, averaging just under eight per hour, and feels the same whether using the machine or not. She has issues with her mask, which does not provide a good seal, allowing air to escape.  Her fatigue has worsened over the past six to eight months, significantly affecting her afternoons and evenings. She dozes off at work in warm, quiet environments. She does not nap due to work commitments and consumes one to two cups of caffeine daily. Trazodone was previously ineffective for sleep. She has noted fatigue especially since oophorectomy. She has also had bilateral mastectomy.  She quit smoking in 2017 after a history of  smoking a pack a day. Lung scans have shown emphysema, but she does not experience coughing or wheezing. She is concerned about her lung health due to family history and her own emphysema diagnosis.    CT chest LDS- 06/12/23 MPRESSION: 1. Lung-RADS 2, benign appearance or behavior. Continue annual screening with low-dose chest CT without contrast in 12 months. 2.  Emphysema (ICD10-J43.9).  Assessment and Plan:    Obstructive Sleep Apnea Current CPAP settings inadequate with breakthrough apneas. Mask seal likely ineffective, contributing to suboptimal therapy. Discussed increasing CPAP settings and refitting mask. - Request [facility] to service CPAP machine. - Adjust CPAP settings to maximum 20 cm H2O. - Arrange CPAP mask refitting for improved seal and comfort.  Fatigue Chronic fatigue persists despite CPAP therapy. Discussed potential benefit of sleep medication. - Prescribe temazepam  at low dose for sleep, use as needed, monitor effects.  Emphysema Mild centrilobular emphysema, asymptomatic. Discussed potential progression and need for pulmonary function test. - Order pulmonary function test to assess lung function and correlate with imaging.  Menopausal Symptoms Post-hysterectomy hot flashes and sleep disturbances consistent with menopause, contributing to fatigue.  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  Follow-up Plan to monitor and adjust treatment based on response to interventions. - Monitor response to CPAP adjustments and new sleep medication. - Follow up after pulmonary function test results to discuss emphysema management.   03/15/24- 29 yoF former smoker followed for OSA , complicated by GERD, Emphysema, history of COVID-19, hx Breast Cancer/ mastectomy/XRT/ Reconstruction, COPD,  HST 09/09/19- AHI 23.8/hr, desat to 71%, body weight 201 lbs CPAP auto 5-15/ [facility]  ordered 09/15/19 Download  compliance-97%, AHI 2.4/hr Body weight today-218 lbs  Now losing weight on Zepbound At last visit we increased CPAP range to 5-20 and added temazepam  to improve sleep, seeking to address daytime somnolence. PFT 03/15/24- WNL no response to BD Discussed the use of AI scribe software for clinical note transcription with the patient, who gave verbal consent to proceed.  History of Present Illness   Diana Tate is a 53 year old female former smoker who presents for a follow-up on her CPAP therapy and pulmonary function test results.  Her CPAP machine settings have been adjusted and are now functioning within the range of 5 to 20 cm H2O, with most of the time spent at 10 or 11 cm H2O. The average number of breakthrough apneas per hour is less than three. She indicates comfort with CPAP and better sleep.  She does not use albuterol regularly and only used it for a pulmonary function test, experiencing discomfort with tremor.     Assessment and Plan:    Obstructive sleep apnea CPAP therapy effectively manages apnea with less than three events per hour. Machine settings optimized.  - Continue CPAP therapy with current settings. - Schedule follow-up with sleep doctor in one year.   Emphysema- mild, on imaging only PFT is within normal -remains off cigarettes      ROS-see HPI   + = positive Constitutional:    weight loss, night sweats, fevers, chills, fatigue, lassitude. HEENT:    headaches, difficulty swallowing, tooth/dental problems, sore throat,       sneezing, itching, ear ache, nasal congestion, post nasal drip, snoring CV:    chest pain, orthopnea, PND, swelling in lower extremities, anasarca,                                   dizziness, palpitations Resp:   shortness of breath with exertion or at rest.                productive cough,   non-productive cough, coughing up of blood.              change in color of mucus.  wheezing.   Skin:    rash or  lesions. GI:  No-   heartburn, indigestion, abdominal pain, nausea, vomiting, diarrhea,                 change in bowel habits, loss of appetite GU: dysuria, change in color of urine, no urgency or frequency.   flank pain. MS:   joint pain, stiffness, decreased range of motion, back pain. Neuro-     nothing unusual Psych:  change in mood or affect.  depression or anxiety.   memory loss.  OBJ- Physical Exam General- Alert, Oriented, Affect-appropriate, Distress- none acute, + overweight Skin- rash-none, lesions- none, excoriation- none Lymphadenopathy- none Head- atraumatic            Eyes- Gross vision intact, PERRLA, conjunctivae and secretions clear            Ears- Hearing, canals-normal            Nose- Clear, no-Septal dev, mucus, polyps, erosion, perforation             Throat- Mallampati III , mucosa clear , drainage- none, tonsils- atrophic, + teeth Neck- flexible , trachea midline, no stridor , thyroid nl, carotid no bruit Chest - symmetrical excursion , unlabored           Heart/CV- RRR , no  murmur , no gallop  , no rub, nl s1 s2                           - JVD- none , edema- none, stasis changes- none, varices- none           Lung- clear to P&A, wheeze- none, cough- none , dullness-none, rub- none           Chest wall-  Abd-  Br/ Gen/ Rectal- Not done, not indicated Extrem- cyanosis- none, clubbing, none, atrophy- none, strength- nl Neuro- grossly intact to observation

## 2024-03-15 NOTE — Progress Notes (Signed)
 Full pft performed today.

## 2024-03-15 NOTE — Patient Instructions (Signed)
 Full pft performed today.

## 2024-03-20 ENCOUNTER — Encounter: Payer: Self-pay | Admitting: Internal Medicine
